# Patient Record
Sex: Female | Born: 1945 | Race: White | Hispanic: No | Smoking: Former smoker
Health system: Southern US, Community
[De-identification: ages and names within clinical notes are randomized; demographics above are authoritative.]

## PROBLEM LIST (undated history)

## (undated) DIAGNOSIS — G4700 Insomnia, unspecified: Secondary | ICD-10-CM

## (undated) DIAGNOSIS — I10 Essential (primary) hypertension: Secondary | ICD-10-CM

## (undated) DIAGNOSIS — J189 Pneumonia, unspecified organism: Secondary | ICD-10-CM

## (undated) DIAGNOSIS — J449 Chronic obstructive pulmonary disease, unspecified: Secondary | ICD-10-CM

## (undated) DIAGNOSIS — E785 Hyperlipidemia, unspecified: Secondary | ICD-10-CM

## (undated) DIAGNOSIS — I2510 Atherosclerotic heart disease of native coronary artery without angina pectoris: Secondary | ICD-10-CM

## (undated) DIAGNOSIS — R590 Localized enlarged lymph nodes: Secondary | ICD-10-CM

## (undated) DIAGNOSIS — K219 Gastro-esophageal reflux disease without esophagitis: Secondary | ICD-10-CM

## (undated) DIAGNOSIS — M8580 Other specified disorders of bone density and structure, unspecified site: Secondary | ICD-10-CM

## (undated) DIAGNOSIS — F419 Anxiety disorder, unspecified: Secondary | ICD-10-CM

## (undated) DIAGNOSIS — IMO0001 Reserved for inherently not codable concepts without codable children: Secondary | ICD-10-CM

## (undated) DIAGNOSIS — R918 Other nonspecific abnormal finding of lung field: Secondary | ICD-10-CM

## (undated) DIAGNOSIS — T451X5A Adverse effect of antineoplastic and immunosuppressive drugs, initial encounter: Principal | ICD-10-CM

## (undated) DIAGNOSIS — Z5111 Encounter for antineoplastic chemotherapy: Secondary | ICD-10-CM

## (undated) DIAGNOSIS — D701 Agranulocytosis secondary to cancer chemotherapy: Secondary | ICD-10-CM

## (undated) DIAGNOSIS — C801 Malignant (primary) neoplasm, unspecified: Secondary | ICD-10-CM

## (undated) DIAGNOSIS — IMO0002 Reserved for concepts with insufficient information to code with codable children: Secondary | ICD-10-CM

## (undated) HISTORY — DX: Other specified disorders of bone density and structure, unspecified site: M85.80

## (undated) HISTORY — DX: Reserved for inherently not codable concepts without codable children: IMO0001

## (undated) HISTORY — DX: Encounter for antineoplastic chemotherapy: Z51.11

## (undated) HISTORY — DX: Gastro-esophageal reflux disease without esophagitis: K21.9

## (undated) HISTORY — PX: APPENDECTOMY: SHX54

## (undated) HISTORY — DX: Localized enlarged lymph nodes: R59.0

## (undated) HISTORY — PX: OTHER SURGICAL HISTORY: SHX169

## (undated) HISTORY — DX: Hyperlipidemia, unspecified: E78.5

## (undated) HISTORY — PX: TONSILLECTOMY: SUR1361

## (undated) HISTORY — DX: Reserved for concepts with insufficient information to code with codable children: IMO0002

## (undated) HISTORY — DX: Agranulocytosis secondary to cancer chemotherapy: D70.1

## (undated) HISTORY — DX: Atherosclerotic heart disease of native coronary artery without angina pectoris: I25.10

## (undated) HISTORY — DX: Insomnia, unspecified: G47.00

## (undated) HISTORY — DX: Adverse effect of antineoplastic and immunosuppressive drugs, initial encounter: T45.1X5A

## (undated) HISTORY — DX: Other nonspecific abnormal finding of lung field: R91.8

---

## 2001-03-29 ENCOUNTER — Ambulatory Visit (HOSPITAL_COMMUNITY): Admission: RE | Admit: 2001-03-29 | Discharge: 2001-03-29 | Payer: Self-pay | Admitting: Gastroenterology

## 2003-10-01 ENCOUNTER — Other Ambulatory Visit: Admission: RE | Admit: 2003-10-01 | Discharge: 2003-10-01 | Payer: Self-pay | Admitting: Family Medicine

## 2003-10-01 ENCOUNTER — Encounter: Admission: RE | Admit: 2003-10-01 | Discharge: 2003-10-01 | Payer: Self-pay | Admitting: Family Medicine

## 2004-10-05 ENCOUNTER — Other Ambulatory Visit: Admission: RE | Admit: 2004-10-05 | Discharge: 2004-10-05 | Payer: Self-pay | Admitting: Family Medicine

## 2005-10-06 ENCOUNTER — Other Ambulatory Visit: Admission: RE | Admit: 2005-10-06 | Discharge: 2005-10-06 | Payer: Self-pay | Admitting: Family Medicine

## 2006-03-17 ENCOUNTER — Encounter: Admission: RE | Admit: 2006-03-17 | Discharge: 2006-03-17 | Payer: Self-pay | Admitting: Family Medicine

## 2008-01-10 ENCOUNTER — Other Ambulatory Visit: Admission: RE | Admit: 2008-01-10 | Discharge: 2008-01-10 | Payer: Self-pay | Admitting: Family Medicine

## 2010-06-25 NOTE — Procedures (Signed)
[place]  Patient:    Tammie Clay, TABARES Visit Number: [id number] MRN: [id number]          Service Type: END Location: ENDO Attending Physician:  Dennison Bulla Ii Dictated by:   Verlin Grills, M.D. Proc. Date: 03/29/01 Admit Date:  03/29/2001 Discharge Date: 03/29/2001   CC:         [name], M.D.   Procedure Report  DATE OF BIRTH:  REFERRING PHYSICIAN:  [name], M.D.  PROCEDURE PERFORMED:  Colonoscopy.  ENDOSCOPIST:  Verlin Grills, M.D.  INDICATIONS FOR PROCEDURE:  The patient is a 65 year old female who is due for her first screening colonoscopy with polypectomy to prevent colon cancer.  I discussed with the patient the complications associated with colonoscopy and polypectomy including a 15 per 1000 risk of bleeding and 4 per 1000 risk of colon perforation requiring surgical repair.  The patient has signed the operative permit.  PREMEDICATION:  Versed 6 mg, fentanyl 50 mcg.  ENDOSCOPE:  Olympus pediatric video colonoscope.  DESCRIPTION OF PROCEDURE:  After obtaining informed consent, the patient was placed in the left lateral decubitus position.  I administered intravenous fentanyl and intravenous Versed to achieve conscious sedation for the procedure.  The patients blood pressure, oxygen saturation and cardiac rhythm were monitored throughout the procedure and documented in the medical record.  Anal inspection was normal.  Digital rectal exam was normal.  The Olympus pediatric video colonoscope was then introduced into the rectum and easily advanced to the cecum.  Colonic preparation for the exam today was excellent.  Rectum:  Normal.  Sigmoid colon and descending colon:  Normal.  Splenic flexure:  Normal.  Transverse colon:  Normal.  Hepatic flexure:  Normal.  Ascending colon:  Normal.  Cecum and ileocecal valve:  Normal.  ASSESSMENT:  Normal screening proctocolonoscopy to  the cecum.  No endoscopic evidence for the presence of colorectal neoplasia.  RECOMMENDATIONS:  Repeat colonoscopy in approximately 10 years. Dictated by:   Verlin Grills, M.D. Attending Physician:  Dennison Bulla Ii DD:  03/29/01 TD:  03/29/01 Job: [id number]

## 2012-03-29 ENCOUNTER — Other Ambulatory Visit: Payer: Self-pay | Admitting: Family Medicine

## 2012-03-29 ENCOUNTER — Ambulatory Visit
Admission: RE | Admit: 2012-03-29 | Discharge: 2012-03-29 | Disposition: A | Discharge status: Home / Self Care | Payer: No Typology Code available for payment source | Source: Ambulatory Visit | Attending: Family Medicine | Admitting: Family Medicine

## 2012-03-29 DIAGNOSIS — Z006 Encounter for examination for normal comparison and control in clinical research program: Secondary | ICD-10-CM

## 2012-06-27 ENCOUNTER — Other Ambulatory Visit: Payer: Self-pay | Admitting: Gastroenterology

## 2012-07-03 ENCOUNTER — Encounter (HOSPITAL_COMMUNITY): Payer: Self-pay | Admitting: *Deleted

## 2012-07-03 ENCOUNTER — Encounter (HOSPITAL_COMMUNITY): Payer: Self-pay | Admitting: Pharmacy Technician

## 2012-07-04 NOTE — Progress Notes (Signed)
ekg 05-24-2012 pharmquest on chart

## 2012-07-17 ENCOUNTER — Ambulatory Visit (HOSPITAL_COMMUNITY)
Admission: RE | Admit: 2012-07-17 | Discharge: 2012-07-17 | Disposition: A | Discharge status: Home / Self Care | Payer: Medicare Other | Source: Ambulatory Visit | Attending: Gastroenterology | Admitting: Gastroenterology

## 2012-07-17 ENCOUNTER — Ambulatory Visit (HOSPITAL_COMMUNITY): Payer: Medicare Other | Admitting: Anesthesiology

## 2012-07-17 ENCOUNTER — Encounter (HOSPITAL_COMMUNITY): Payer: Self-pay | Admitting: *Deleted

## 2012-07-17 ENCOUNTER — Encounter (HOSPITAL_COMMUNITY): Payer: Self-pay | Admitting: Anesthesiology

## 2012-07-17 ENCOUNTER — Encounter (HOSPITAL_COMMUNITY)
Admission: RE | Disposition: A | Discharge status: Home / Self Care | Payer: Self-pay | Source: Ambulatory Visit | Attending: Gastroenterology

## 2012-07-17 DIAGNOSIS — R143 Flatulence: Secondary | ICD-10-CM | POA: Insufficient documentation

## 2012-07-17 DIAGNOSIS — I10 Essential (primary) hypertension: Secondary | ICD-10-CM | POA: Insufficient documentation

## 2012-07-17 DIAGNOSIS — J4489 Other specified chronic obstructive pulmonary disease: Secondary | ICD-10-CM | POA: Insufficient documentation

## 2012-07-17 DIAGNOSIS — K921 Melena: Secondary | ICD-10-CM | POA: Insufficient documentation

## 2012-07-17 DIAGNOSIS — R142 Eructation: Secondary | ICD-10-CM | POA: Insufficient documentation

## 2012-07-17 DIAGNOSIS — M899 Disorder of bone, unspecified: Secondary | ICD-10-CM | POA: Insufficient documentation

## 2012-07-17 DIAGNOSIS — Z79899 Other long term (current) drug therapy: Secondary | ICD-10-CM | POA: Insufficient documentation

## 2012-07-17 DIAGNOSIS — Z8711 Personal history of peptic ulcer disease: Secondary | ICD-10-CM | POA: Insufficient documentation

## 2012-07-17 DIAGNOSIS — K5730 Diverticulosis of large intestine without perforation or abscess without bleeding: Secondary | ICD-10-CM | POA: Insufficient documentation

## 2012-07-17 DIAGNOSIS — Z9089 Acquired absence of other organs: Secondary | ICD-10-CM | POA: Insufficient documentation

## 2012-07-17 DIAGNOSIS — M949 Disorder of cartilage, unspecified: Secondary | ICD-10-CM | POA: Insufficient documentation

## 2012-07-17 DIAGNOSIS — R141 Gas pain: Secondary | ICD-10-CM | POA: Insufficient documentation

## 2012-07-17 DIAGNOSIS — J449 Chronic obstructive pulmonary disease, unspecified: Secondary | ICD-10-CM | POA: Insufficient documentation

## 2012-07-17 DIAGNOSIS — E7800 Pure hypercholesterolemia, unspecified: Secondary | ICD-10-CM | POA: Insufficient documentation

## 2012-07-17 HISTORY — DX: Essential (primary) hypertension: I10

## 2012-07-17 HISTORY — DX: Chronic obstructive pulmonary disease, unspecified: J44.9

## 2012-07-17 HISTORY — PX: COLONOSCOPY WITH PROPOFOL: SHX5780

## 2012-07-17 HISTORY — PX: ESOPHAGOGASTRODUODENOSCOPY (EGD) WITH PROPOFOL: SHX5813

## 2012-07-17 SURGERY — COLONOSCOPY WITH PROPOFOL
Anesthesia: General

## 2012-07-17 MED ORDER — SODIUM CHLORIDE 0.9 % IV SOLN
INTRAVENOUS | Status: DC
  Administered 2012-07-17: 11:00:00 via INTRAVENOUS

## 2012-07-17 MED ORDER — PROMETHAZINE HCL 25 MG/ML IJ SOLN: 6.2500 mg | INTRAMUSCULAR | Status: DC | PRN

## 2012-07-17 MED ORDER — BUTAMBEN-TETRACAINE-BENZOCAINE 2-2-14 % EX AERO
INHALATION_SPRAY | CUTANEOUS | Status: DC | PRN
  Administered 2012-07-17: 1 via TOPICAL

## 2012-07-17 MED ORDER — MEPERIDINE HCL 100 MG/ML IJ SOLN: 6.2500 mg | INTRAMUSCULAR | Status: DC | PRN

## 2012-07-17 MED ORDER — LACTATED RINGERS IV SOLN: INTRAVENOUS | Status: DC

## 2012-07-17 MED ORDER — FENTANYL CITRATE 0.05 MG/ML IJ SOLN
INTRAMUSCULAR | Status: DC | PRN
  Administered 2012-07-17: 50 ug via INTRAVENOUS

## 2012-07-17 MED ORDER — PROPOFOL 10 MG/ML IV BOLUS
INTRAVENOUS | Status: DC | PRN
  Administered 2012-07-17: 50 mg via INTRAVENOUS
  Administered 2012-07-17 (×3): 25 mg via INTRAVENOUS

## 2012-07-17 MED ORDER — MIDAZOLAM HCL 5 MG/5ML IJ SOLN
INTRAMUSCULAR | Status: DC | PRN
  Administered 2012-07-17: 2 mg via INTRAVENOUS

## 2012-07-17 SURGICAL SUPPLY — 25 items

## 2012-07-17 NOTE — Anesthesia Postprocedure Evaluation (Signed)
  Anesthesia Post-op Note  Patient: Tammie Clay  Procedure(s) Performed: Procedure(s) (LRB): COLONOSCOPY WITH PROPOFOL (N/A) ESOPHAGOGASTRODUODENOSCOPY (EGD) WITH PROPOFOL (N/A)  Patient Location: PACU  Anesthesia Type: MAC  Level of Consciousness: awake and alert   Airway and Oxygen Therapy: Patient Spontanous Breathing  Post-op Pain: mild  Post-op Assessment: Post-op Vital signs reviewed, Patient's Cardiovascular Status Stable, Respiratory Function Stable, Patent Airway and No signs of Nausea or vomiting  Last Vitals:  Filed Vitals:   07/17/12 1200  BP: 144/82  Pulse:   Temp:   Resp: 20    Post-op Vital Signs: stable   Complications: No apparent anesthesia complications

## 2012-07-17 NOTE — Anesthesia Preprocedure Evaluation (Addendum)
Anesthesia Evaluation  Patient identified by MRN, date of birth, ID band Patient awake    Reviewed: Allergy & Precautions, H&P , NPO status , Patient's Chart, lab work & pertinent test results  Airway Mallampati: II TM Distance: >3 FB Neck ROM: Full    Dental no notable dental hx.    Pulmonary neg pulmonary ROS, COPD COPD inhaler, Current Smoker,  breath sounds clear to auscultation  Pulmonary exam normal       Cardiovascular hypertension, Pt. on medications Rhythm:Regular Rate:Normal     Neuro/Psych negative neurological ROS  negative psych ROS   GI/Hepatic negative GI ROS, Neg liver ROS,   Endo/Other  negative endocrine ROS  Renal/GU negative Renal ROS  negative genitourinary   Musculoskeletal negative musculoskeletal ROS (+)   Abdominal   Peds negative pediatric ROS (+)  Hematology negative hematology ROS (+)   Anesthesia Other Findings   Reproductive/Obstetrics negative OB ROS                          Anesthesia Physical Anesthesia Plan  ASA: III  Anesthesia Plan: General   Post-op Pain Management:    Induction:   Airway Management Planned:   Additional Equipment:   Intra-op Plan:   Post-operative Plan:   Informed Consent: I have reviewed the patients History and Physical, chart, labs and discussed the procedure including the risks, benefits and alternatives for the proposed anesthesia with the patient or authorized representative who has indicated his/her understanding and acceptance.   Dental advisory given  Plan Discussed with: CRNA  Anesthesia Plan Comments:         Anesthesia Quick Evaluation

## 2012-07-17 NOTE — H&P (Signed)
  Problem: Hematochezia and abdominal bloating on aspirin. Normal screening colonoscopies in 2003 and in 2010.  History: The patient is a 67 year old female born Sep 16, 1945. The patient underwent a normal screening colonoscopies in 2003 and 2010.  The patient chronically takes aspirin on a daily basis and is primary prevention.  Approximately 3 weeks ago, the patient had a few episodes of small volume hematochezia. She has been experiencing abdominal bloating. There is no past history of peptic ulcer disease.  The patient's CBC was normal.  The patient is scheduled to undergo a diagnostic esophagogastroduodenoscopy and colonoscopy to evaluate resolved hematochezia and abdominal bloating on aspirin. She stop taking aspirin on Jun 27, 2012.  Chronic medications: Multivitamin. Calcium. Vitamin D. Atorvastatin. WelChol. Fish oil. Lisinopril. Hydrochlorothiazide. Metoprolol. Trazodone. Spiriva.  Past medical and surgical history: Tonsillectomy. Appendectomy. Cesarean sections. Urethral cyst surgery. 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 Osteopenia. Hypercholesterolemia. Hypertension.  Medication allergies: None  Exam: The patient is alert and lying comfortably on the endoscopy stretcher. Abdomen is soft, flat, and nontender to palpation. Cardiac exam reveals a regular rhythm. Lungs are clear to auscultation.  Plan: Proceed with diagnostic esophagogastroduodenoscopy and colonoscopy to evaluate abdominal bloating and hematochezia while taking aspirin.

## 2012-07-17 NOTE — Op Note (Signed)
Problem: Hematochezia and abdominal bloating while taking aspirin  Procedure: Diagnostic esophagogastroduodenoscopy  Endoscopist: Danise Edge  Premedication: Propofol administered by anesthesia  Procedure: The patient was placed in the left lateral decubitus position. The Pentax gastroscope was passed through the posterior hypopharynx into the proximal esophagus without difficulty. The hypopharynx, larynx, and vocal cords appeared normal.  Esophagoscopy: The proximal, mid, and lower segments of the esophageal mucosa appear normal. The squamocolumnar junction is noted at 39 cm from the incisor teeth. There is no endoscopic evidence for the presence of erosive esophagitis or Barrett's esophagus.  Gastroscopy: Retroflex view of the gastric cardia and fundus was normal. The gastric body, antrum, and pylorus appeared normal.  Duodenoscopy: The duodenal bulb and descending duodenum appeared.  Assessment: Normal esophagogastroduodenoscopy.  Procedure: Diagnostic colonoscopy to evaluate resolved hematochezia.  Endoscopist: Danise Edge  Premedication: Propofol administered by anesthesia  Procedure: Anal inspection and digital rectal exam were normal. The Pentax pediatric colonoscope was introduced into the rectum and advanced to the cecum. A normal-appearing ileocecal valve and appendiceal orifice were identified. Colonic preparation for the exam today was good.  Rectum. Normal. Retroflex view of the distal rectum normal.  Sigmoid colon and descending colon. Left colonic diverticulosis.  Splenic flexure. Normal.  Transverse colon. Normal.  Hepatic flexure. Normal.  Ascending colon. Normal.  Cecum and ileocecal valve. Normal.  Assessment: Normal proctocolonoscopy to the cecum except for the presence of left colonic diverticulosis  Recommendations: Remain off aspirin. Schedule repeat screening colonoscopy in 10 years.

## 2012-07-17 NOTE — Transfer of Care (Signed)
Immediate Anesthesia Transfer of Care Note  Patient: Tammie Clay  Procedure(s) Performed: Procedure(s): COLONOSCOPY WITH PROPOFOL (N/A) ESOPHAGOGASTRODUODENOSCOPY (EGD) WITH PROPOFOL (N/A)  Patient Location: PACU  Anesthesia Type:MAC  Level of Consciousness: awake, sedated and patient cooperative  Airway & Oxygen Therapy: Patient Spontanous Breathing and Patient connected to face mask oxygen  Post-op Assessment: Report given to PACU RN and Post -op Vital signs reviewed and stable  Post vital signs: Reviewed and stable  Complications: No apparent anesthesia complications

## 2012-07-18 ENCOUNTER — Encounter (HOSPITAL_COMMUNITY): Payer: Self-pay | Admitting: Gastroenterology

## 2013-11-15 ENCOUNTER — Other Ambulatory Visit: Payer: Self-pay | Admitting: Family Medicine

## 2013-11-15 ENCOUNTER — Ambulatory Visit
Admission: RE | Admit: 2013-11-15 | Discharge: 2013-11-15 | Disposition: A | Discharge status: Home / Self Care | Payer: Medicare Other | Source: Ambulatory Visit | Attending: Family Medicine | Admitting: Family Medicine

## 2013-11-15 DIAGNOSIS — R1012 Left upper quadrant pain: Secondary | ICD-10-CM

## 2013-11-18 ENCOUNTER — Other Ambulatory Visit: Payer: Self-pay | Admitting: Family Medicine

## 2013-11-18 DIAGNOSIS — R9389 Abnormal findings on diagnostic imaging of other specified body structures: Secondary | ICD-10-CM

## 2013-11-19 ENCOUNTER — Ambulatory Visit
Admission: RE | Admit: 2013-11-19 | Discharge: 2013-11-19 | Disposition: A | Discharge status: Home / Self Care | Payer: Medicare Other | Source: Ambulatory Visit | Attending: Family Medicine | Admitting: Family Medicine

## 2013-11-19 DIAGNOSIS — R9389 Abnormal findings on diagnostic imaging of other specified body structures: Secondary | ICD-10-CM

## 2013-11-19 DIAGNOSIS — R918 Other nonspecific abnormal finding of lung field: Secondary | ICD-10-CM | POA: Insufficient documentation

## 2013-11-19 DIAGNOSIS — R590 Localized enlarged lymph nodes: Secondary | ICD-10-CM

## 2013-11-19 HISTORY — DX: Other nonspecific abnormal finding of lung field: R91.8

## 2013-11-19 HISTORY — DX: Localized enlarged lymph nodes: R59.0

## 2013-11-19 MED ORDER — IOHEXOL 300 MG/ML  SOLN
75.0000 mL | Freq: Once | INTRAMUSCULAR | Status: AC | PRN
  Administered 2013-11-19: 75 mL via INTRAVENOUS

## 2013-11-20 ENCOUNTER — Encounter: Payer: Self-pay | Admitting: *Deleted

## 2013-11-20 DIAGNOSIS — K219 Gastro-esophageal reflux disease without esophagitis: Secondary | ICD-10-CM | POA: Insufficient documentation

## 2013-11-20 DIAGNOSIS — M8580 Other specified disorders of bone density and structure, unspecified site: Secondary | ICD-10-CM | POA: Insufficient documentation

## 2013-11-20 DIAGNOSIS — R918 Other nonspecific abnormal finding of lung field: Secondary | ICD-10-CM | POA: Insufficient documentation

## 2013-11-20 DIAGNOSIS — E785 Hyperlipidemia, unspecified: Secondary | ICD-10-CM | POA: Insufficient documentation

## 2013-11-21 ENCOUNTER — Other Ambulatory Visit: Payer: Self-pay | Admitting: *Deleted

## 2013-11-21 ENCOUNTER — Institutional Professional Consult (permissible substitution) (INDEPENDENT_AMBULATORY_CARE_PROVIDER_SITE_OTHER): Payer: Medicare Other | Admitting: Cardiothoracic Surgery

## 2013-11-21 ENCOUNTER — Encounter: Payer: Self-pay | Admitting: Cardiothoracic Surgery

## 2013-11-21 ENCOUNTER — Other Ambulatory Visit: Payer: Self-pay

## 2013-11-21 VITALS — BP 121/80 | HR 80 | Ht 62.0 in | Wt 173.0 lb

## 2013-11-21 DIAGNOSIS — J449 Chronic obstructive pulmonary disease, unspecified: Secondary | ICD-10-CM

## 2013-11-21 DIAGNOSIS — R911 Solitary pulmonary nodule: Secondary | ICD-10-CM

## 2013-11-21 DIAGNOSIS — R918 Other nonspecific abnormal finding of lung field: Secondary | ICD-10-CM

## 2013-11-21 DIAGNOSIS — I2510 Atherosclerotic heart disease of native coronary artery without angina pectoris: Secondary | ICD-10-CM

## 2013-11-21 DIAGNOSIS — J441 Chronic obstructive pulmonary disease with (acute) exacerbation: Secondary | ICD-10-CM | POA: Insufficient documentation

## 2013-11-21 HISTORY — DX: Chronic obstructive pulmonary disease, unspecified: J44.9

## 2013-11-21 HISTORY — DX: Atherosclerotic heart disease of native coronary artery without angina pectoris: I25.10

## 2013-11-21 NOTE — Progress Notes (Signed)
[address]       [place] [address]             [phone]                    Karem A Melick McKinney Medical Record #[id number] Date of Birth: [birth year]  Referring: Mayra Neer, MD Primary Care: Mayra Neer, MD  Chief Complaint:    Chief Complaint  Patient presents with  . Lung Lesion    Surgical eval on Pulmonary nodule, Chest CT 11/19/13     History of Present Illness:    Tammie Clay 68 y.o. female is seen in the office  today for new diagnosis of left upper lobe lung lesion with question of mediastinal involvement. The patient has been a long term smoker, quit one year ago but prior smoked for 55 years. She has no occupational exposure to asbestos.   She had increasing belching and discomfort in her left chest when she lives on her left side, she mentioned this at her yearly checkup. A chest x-ray was obtained suggesting a left hilar mass.   Patient presents to the thoracic surgical office for evaluation and treatment.   Current Activity/ Functional Status:  Patient is independent with mobility/ambulation, transfers, ADL's, IADL's.   Zubrod Score: At the time of surgery this patient's most appropriate activity status/level should be described as: []     0    Normal activity, no symptoms [x]     1    Restricted in physical strenuous activity but ambulatory, able to do out light work []     2    Ambulatory and capable of self care, unable to do work activities, up and about               >50 % of waking hours                              []     3    Only limited self care, in bed greater than 50% of waking hours []     4    Completely disabled, no self care, confined to bed or chair []     5    Moribund   Past Medical History  Diagnosis Date  . Hypertension   . COPD (chronic obstructive pulmonary disease)   . Hyperlipidemia   . GERD (gastroesophageal reflux disease)   . Osteopenia   . Insomnia   . Mediastinal lymphadenopathy  11/19/13    PER CT  . Multiple lung nodules on CT 11/19/13    LEFT LUNG BASE  . Coronary artery calcification seen on CAT scan 11/21/2013  . COPD (chronic obstructive pulmonary disease) 11/21/2013     Past Surgical History  Procedure Laterality Date  .  c section x 2    . Urethral cyst removed    . Tonsillectomy    . Appendectomy    . Colonoscopy with propofol N/A 07/17/2012    Procedure: COLONOSCOPY WITH PROPOFOL;  Surgeon: Garlan Fair, MD;  Location: [facility];  Service: Endoscopy;  Laterality: N/A;  . Esophagogastroduodenoscopy (egd) with propofol N/A 07/17/2012    Procedure: ESOPHAGOGASTRODUODENOSCOPY (EGD) WITH PROPOFOL;  Surgeon: Garlan Fair, MD;  Location: [facility];  Service: Endoscopy;  Laterality: N/A;    Family History  Problem Relation Age of Onset  . Diabetes Father   . Heart disease Father     MI  .  CVA Mother   . Cancer Cousin     LIPOSARCOMA  . Heart disease Cousin     HEART TRANSPLANTS X 2    History   Social History  . Marital Status: Divorced    Spouse Name: N/A    Number of Children: 3  . Years of Education: N/A   Occupational History  .  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 Smoking status: Former Smoker -- 0.50 packs/day for 50 years    Types: Cigarettes    Quit date: 10/08/2012  . Smokeless tobacco: Never Used     Comment: smokes Vapor daily  . Alcohol Use: No  . Drug Use: No  . Sexual Activity: Not on file   Other Topics Concern  . Not on file   Social History Narrative  . No narrative on file    History  Smoking status  . Former Smoker -- 0.50 packs/day for 50 years  . Types: Cigarettes  . Quit date: 10/08/2012  Smokeless tobacco  . Never Used    Comment: smokes Vapor daily    History  Alcohol Use No     No Known Allergies  Current Outpatient Prescriptions  Medication  Sig Dispense Refill  . albuterol (PROVENTIL HFA;VENTOLIN HFA) 108 (90 BASE) MCG/ACT inhaler Inhale 2 puffs into the lungs every 6 (six) hours as needed for wheezing.      Marland Kitchen aspirin 81 MG tablet Take 81 mg by mouth daily.      Marland Kitchen atorvastatin (LIPITOR) 40 MG tablet Take 40 mg by mouth daily.      . Calcium Carbonate-Vitamin D (CALCIUM-D) 600-400 MG-UNIT TABS Take 1 tablet by mouth 2 (two) times daily.      Marland Kitchen lisinopril-hydrochlorothiazide (PRINZIDE,ZESTORETIC) 20-12.5 MG per tablet Take 2 tablets by mouth every morning.       . metoprolol succinate (TOPROL-XL) 50 MG 24 hr tablet Take 50 mg by mouth every morning. Take with or immediately following a meal.      . Multiple Vitamin (MULTIVITAMIN WITH MINERALS) TABS Take 1 tablet by mouth daily.      . Omega-3 Fatty Acids (OMEGA-3 FISH OIL PO) Take 1 capsule by mouth daily.      Marland Kitchen tiotropium (SPIRIVA) 18 MCG inhalation capsule Place 18 mcg into inhaler and inhale daily.      . traZODone (DESYREL) 50 MG tablet Take 50 mg by mouth at bedtime. 1/2 TO 1 TAB       No current facility-administered medications for this visit.     Review of Systems:     Cardiac Review of Systems: Y or N  Chest Pain Blue.Reese    ]  Resting SOB [ n  ] Exertional SOB  [  y]  Orthopnea Florencio.Farrier  ]   Pedal Edema [  n ]    Palpitations [ n ] Syncope  [ n ]   Presyncope [ n  ]  General Review of Systems: [Y] = yes [  ]=no Constitional: recent weight change [n ];  Wt loss over the last 3 months [   ] anorexia [  ]; fatigue Blue.Reese  ]; nausea [  n]; night sweats [  ]; fever [ n ]; or chills [ n ];          Dental: poor dentition[  ]; Last Dentist visit:   Eye :  blurred vision [  ]; diplopia [   ]; vision changes [  ];  Amaurosis fugax[  ]; Resp: cough Blue.Reese  ];  wheezing[ n ];  hemoptysis[n  ]; shortness of breath[ y ]; paroxysmal nocturnal dyspnea[ n ]; dyspnea on exertion[  y]; or orthopnea[  ];  GI:  gallstones[ n ], vomiting[ n ];  dysphagia[n  ]; melena[ n ];  hematochezia [  ]; heartburn[  ];    Hx of  Colonoscopy[ y ]; GU: kidney stones [  ]; hematuria[  ];   dysuria [  ];  nocturia[  ];  history of     obstruction [  ]; urinary frequency [  ]             Skin: rash, swelling[  ];, hair loss[  ];  peripheral edema[  ];  or itching[  ]; Musculosketetal: myalgias[  ];  joint swelling[  ];  joint erythema[n  ];  joint pain[  ];  back pain[  ];  Heme/Lymph: bruising[  ];  bleeding[  ];  anemia[  ];  Neuro: TIA[  ];  headaches[  ];  stroke[  ];  vertigo[  ];  seizures[  ];   paresthesias[  ];  difficulty walking[ n ];  Psych:depression[  ]; anxiety[  ];  Endocrine: diabetes[  ];  thyroid dysfunction[  ];  Immunizations: Flu up to date Blue.Reese  ]; Pneumococcal up to date Blue.Reese  ];  Other:  Physical Exam: BP 121/80  Pulse 80  Ht 5\' 2"  (1.575 m)  Wt 173 lb (78.472 kg)  BMI 31.63 kg/m2  SpO2 92%  PHYSICAL EXAMINATION:  General appearance: alert and cooperative Neurologic: intact Heart: regular rate and rhythm, S1, S2 normal, no murmur, click, rub or gallop Lungs: diminished breath sounds LUL Abdomen: soft, non-tender; bowel sounds normal; no masses,  no organomegaly Extremities: extremities normal, atraumatic, no cyanosis or edema and Homans sign is negative, no sign of DVT Patient has no cervical or supraclavicular or axillary adenopathy She has no abdominal aneurysm that is palpable PT and DP pulses are palpable bilaterally  Diagnostic Studies & Laboratory data:     Recent Radiology Findings:   Dg Chest 2 View  11/15/2013   CLINICAL DATA:  Left upper quadrant pain with chest discomfort. COPD.  EXAM: CHEST  2 VIEW  COMPARISON:  03/29/2012 and 03/17/2006  FINDINGS: Lungs are hypoinflated and demonstrate increased density with somewhat sharp a lobular lateral border over the left hilar region. Couple small stable right perihilar nodules likely calcified granulomas unchanged from 2008. No evidence of pleural effusion. Cardiac silhouette is within normal. There is mild calcified plaque over  the thoracic aorta. There are mild degenerative changes of the spine.  IMPRESSION: Moderate prominence of the left hilum with lobulated lateral border suspicious for left hilar mass/adenopathy. Recommend contrast-enhanced chest CT for further evaluation.   Electronically Signed   By: Marin Olp M.D.   On: 11/15/2013 15:20   Ct Chest W Contrast  11/19/2013   CLINICAL DATA:  Left hilar mass on chest radiograph. Left upper quadrant pain.  EXAM: CT CHEST WITH CONTRAST  TECHNIQUE: Multidetector CT imaging of the chest was performed during intravenous contrast administration.  CONTRAST:  64mL OMNIPAQUE IOHEXOL 300 MG/ML  SOLN  COMPARISON:  Chest radiograph on 11/15/2013  FINDINGS: Mediastinum/Hilar Regions: Mediastinal lymphadenopathy is seen in the lateral aortic region measuring 1.6 x 4.3 cm on image 20. Sub- carina lymphadenopathy seen measuring 2.1 cm on image  26.  Other Thoracic Lymphadenopathy:  None.  Lungs: Left upper lobe collapse is seen with a central low-attenuation mass measuring 3.9 x 4.2 cm. This is contiguous with the left hilum obstructs the central left upper lobe bronchus.  In addition, there is an 8 mm indeterminate nodular density in the anterior left lung base on image 38. There are also several subpleural pulmonary nodular densities in the left lower lobe, largest measuring 7 mm on image 44.  Pleura:  Small left pleural effusion noted.  Vascular/Cardiac:  No acute findings identified.  Musculoskeletal:  No suspicious bone lesions identified.  Other:  None.  IMPRESSION: 4 cm central left upper lobe mass which involves the hilum and causes left upper lobe collapse. This is consistent with primary bronchogenic carcinoma. Consider bronchoscopy for tissue diagnosis.  Mediastinal lymphadenopathy, consistent with metastatic disease.  Small left pleural effusion.  Indeterminate pulmonary nodules in the left lung base, largest measuring 8 mm. PET-CT scan should be considered for further evaluation.    Electronically Signed   By: Earle Gell M.D.   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 We have made arrangements for pulmonary function studies, MRI of the brain to rule out a test this is, PET scan for staging which will be done October 22 or 23rd. On October 27 after the PET scan has been completed we will proceed with bronchoscopy with ebus to biopsy the primary mass and mediastinal nodes. Assuming the pathology will be available she will have an appointment in the multidisciplinary thoracic oncology clinic on October 29 to see medical and radiation oncology depending on the final pathology and radiographic reports.     I spent 60 minutes counseling the patient face to face. The total time spent in the appointment was 80 minutes.  Grace Isaac MD      [address] [place] [address] Office [phone]   Beeper [phone]  11/21/2013 2:38 PM

## 2013-11-21 NOTE — Patient Instructions (Signed)
Pulmonary Nodule A pulmonary nodule is a small, round growth of tissue in the lung. Pulmonary nodules can range in size from less than 1/5 inch (4 mm) to a little bigger than an inch (25 mm). Most pulmonary nodules are detected when imaging tests of the lung are being performed for a different problem. Pulmonary nodules are usually not cancerous (benign). However, some pulmonary nodules are cancerous (malignant). Follow-up treatment or testing is based on the size of the pulmonary nodule and your risk of getting lung cancer.  CAUSES Benign pulmonary nodules can be caused by various things. Some of the causes include:   Bacterial, fungal, or viral infections. This is usually an old infection that is no longer active, but it can sometimes be a current, active infection.  A benign mass of tissue.  Inflammation from conditions such as rheumatoid arthritis.   Abnormal blood vessels in the lungs. Malignant pulmonary nodules can result from lung cancer or from cancers that spread to the lung from other places in the body. SIGNS AND SYMPTOMS Pulmonary nodules usually do not cause symptoms. DIAGNOSIS Most often, pulmonary nodules are found incidentally when an X-ray or CT scan is performed to look for some other problem in the lung area. To help determine whether a pulmonary nodule is benign or malignant, your health care provider will take a medical history and order a variety of tests. Tests done may include:   Blood tests.  A skin test called a tuberculin test. This test is used to determine if you have been exposed to the germ that causes tuberculosis.   Chest X-rays. If possible, a new X-ray may be compared with X-rays you have had in the past.   CT scan. This test shows smaller pulmonary nodules more clearly than an X-ray.   Positron emission tomography (PET) scan. In this test, a safe amount of a radioactive substance is injected into the bloodstream. Then, the scan takes a picture of  the pulmonary nodule. The radioactive substance is eliminated from your body in your urine.   Biopsy. A tiny piece of the pulmonary nodule is removed so it can be checked under a microscope. TREATMENT  Pulmonary nodules that are benign normally do not require any treatment because they usually do not cause symptoms or breathing problems. Your health care provider may want to monitor the pulmonary nodule through follow-up CT scans. The frequency of these CT scans will vary based on the size of the nodule and the risk factors for lung cancer. For example, CT scans will need to be done more frequently if the pulmonary nodule is larger and if you have a history of smoking and a family history of cancer. Further testing or biopsies may be done if any follow-up CT scan shows that the size of the pulmonary nodule has increased. HOME CARE INSTRUCTIONS  Only take over-the-counter or prescription medicines as directed by your health care provider.  Keep all follow-up appointments with your health care provider. SEEK MEDICAL CARE IF:  You have trouble breathing when you are active.   You feel sick or unusually tired.   You do not feel like eating.   You lose weight without trying to.   You develop chills or night sweats.  SEEK IMMEDIATE MEDICAL CARE IF:  You cannot catch your breath, or you begin wheezing.   You cannot stop coughing.   You cough up blood.   You become dizzy or feel like you are going to pass out.   You   have sudden chest pain.   You have a fever or persistent symptoms for more than 2-3 days.   You have a fever and your symptoms suddenly get worse. MAKE SURE YOU:  Understand these instructions.  Will watch your condition.  Will get help right away if you are not doing well or get worse. Document Released: 11/21/2008 Document Revised: 09/26/2012 Document Reviewed: 07/16/2012 [facility] Patient Information 2015 [place], [place]. This information is not intended  to replace advice given to you by your health care provider. Make sure you discuss any questions you have with your health care provider. Lung Cancer Lung cancer is an abnormal growth of cells in one or both of your lungs. These extra cells may form a mass of tissue called a growth or tumor. Tumors can be either cancerous (malignant) or not cancerous (benign).  Lung cancer is the most common cause of cancer death in men and women. There are several different types of lung cancers. Usually, lung cancer is described as either small cell lung cancer or nonsmall cell lung cancer. Other types of cancer occur in the lungs, including carcinoid and cancers spread from other organs. The types of cancer have different behavior and treatment. RISK FACTORS Smoking is the most common risk factor for developing lung cancer. Other risk factors include:  Radon gas exposure.  Asbestos and other industrial substance exposure.  Second hand tobacco smoke.  Air pollution.  Family or personal history of lung cancer.  Age older than 55 years. CAUSES  Lung cancer usually starts when the lungs are exposed to harmful chemicals. Smoking is the most common risk factor for lung cancer. When you quit smoking, your risk of lung cancer falls each year (but is never the same as a person who has never smoked).  SYMPTOMS  Lung cancer may not have any symptoms in its early stages. The symptoms can depend on the type of cancer, its location, and other factors. Symptoms can include:  Cough (either new, different, or more severe).  Shortness of breath.  Coughing up blood (hemoptysis).  Chest pain.  Hoarseness.  Swelling of the face.  Drooping eyelid.  Changes in blood tests, such as low sodium (hyponatremia), high calcium (hypercalcemia), or low blood count (anemia).  Weight loss. DIAGNOSIS  Your health care provider may suspect lung cancer based on your symptoms or based on tests obtained for other reasons. Tests  or procedures used to find or confirm the presence of lung cancer may include:  Chest X-ray.  CT scan of the lungs and chest.  Blood tests.  Taking a tissue sample (biopsy) from your lung to look for cancer cells. Your cancer will be staged to determine its severity and extent. Staging is a careful attempt to find out the size of the tumor, whether the cancer has spread, and if so, to what parts of the body. You may need to have more tests to determine the stage of your cancer. The test results will help determine what treatment plan is best for you.   Stage 0--This is the earliest stage of lung cancer. In this stage the tumor is present in only a few layers of cells and has not grown beyond the inner lining of the lungs. Stage 0 (carcinoma in situ) is considered noninvasive, meaning at this stage it is not yet capable of spreading to other regions.  Stage I-- The cancer is located only in the lungs and not spread to any lymph nodes.  Stage II--The cancer is in  the lungs and the nearby lymph nodes.  Stage III--The cancer is in the lungs and the lymph nodes in the middle of the chest. This is also called locally advanced disease. This stage has two subtypes:  Stage IIIa - The cancer has spread only to lymph nodes on the same side of the chest where the cancer started.  Stage IIIb - The cancer has spread to lymph nodes on the opposite side of the chest or above the collar bone.  Stage IV-- This is the most advanced stage of lung cancer and is also called advanced disease. This stage describes when the cancer has spread to both lungs, the fluid in the area around the lungs, or to another body part. Your health care provider may tell you the detailed stage of your cancer, which includes both a number and a letter.  TREATMENT  Depending on the type and stage, lung cancer may be treated with surgery, radiation therapy, chemotherapy, or targeted therapy. Some people have a combination of these  therapies. Your treatment plan will be developed by your health care team.  [place] not smoke.  Only take over-the-counter or prescription medicines for pain, discomfort, or fever as directed by your health care provider.  Maintain a healthy diet.  Consider joining a support group. This may help you learn to cope with the stress of having lung cancer.  Seek advice to help you manage treatment side effects.  Keep all follow-up appointments as directed by your health care provider.  Inform your cancer specialist if you are admitted to the hospital. [place] IF:   You are losing weight without trying.  You have a persistent cough.  You feel short of breath.  You tire easily. SEEK IMMEDIATE MEDICAL CARE IF:   You cough up clotted blood or bright red blood.  Your pain is not manageable or controlled by medicine.  You develop new difficulty breathing or chest pain.  You develop swelling in one or both ankles or legs, or swelling in your face or neck.  You develop headache or confusion. Document Released: 05/02/2000 Document Revised: 11/14/2012 Document Reviewed: 05/30/2013 [facility] Patient Information 2015 [place], [place]. This information is not intended to replace advice given to you by your health care provider. Make sure you discuss any questions you have with your health care provider.  Flexible Bronchoscopy Bronchoscopy is a procedure used to examine the passageways in the lungs. During the procedure a thin, flexible tool with a lens and camera or eyepiece is passed in your mouth or nose, down the windpipe (trachea), and into the air tubes (bronchi). This tool allows your health care provider to carefully look at your lungs from the inside and take diagnostic samples if needed.  LET [facility] CARE PROVIDER KNOW ABOUT:   Allergies to food or medicine.   All medicines you are taking, including blood thinners, vitamins, herbs, eye drops,  creams, and over-the-counter medicines.   Previous problems you or members of your family have had with the use of anesthetics.   Any blood disorders you have.   Previous surgeries you have had.   Medical conditions you have, including heart disease, diabetes, or kidney problems.   Possibility of pregnancy, if this applies. RISKS AND COMPLICATIONS Generally, this is a safe procedure. However, as with any procedure, problems can occur. Possible problems include:   Collapsed lung (pneumothorax).  Bleeding.  Increased need for oxygen or difficulty breathing after the procedure. BEFORE THE  PROCEDURE  Do not eat or drink anything after midnight on the night before the procedure or as directed by your health care provider.  PROCEDURE   Relax as much as possible during the procedure.  Medicines may be given to relax you, dry up your secretions, and control coughing.   A numbing medicine (local anesthetic) will be given to numb your mouth, nose, throat, and voice box (larynx). You will be able to breath normally during the procedure.   Samples of airway secretions may be collected for testing.  If abnormal areas are seen in your airways, tissue samples may be taken for examination under a microscope (biopsy).  If tissue samples are needed from the outer portions of the lung, a type of X-ray called fluoroscopy may be done.   If bleeding occurs, a drug may be used to stop or decrease the bleeding.  AFTER THE PROCEDURE   You may receive a chest X-ray following the procedure. This is to make sure the lungs have not collapsed (pneumothorax).  Document Released: 01/22/2000 Document Revised: 06/10/2013 Document Reviewed: 09/28/2012 [facility] Patient Information 2015 [place], [place]. This information is not intended to replace advice given to you by your health care provider. Make sure you discuss any questions you have with your health care provider.

## 2013-11-22 ENCOUNTER — Other Ambulatory Visit: Payer: Medicare Other

## 2013-11-22 ENCOUNTER — Other Ambulatory Visit: Payer: Self-pay | Admitting: *Deleted

## 2013-11-22 DIAGNOSIS — R911 Solitary pulmonary nodule: Secondary | ICD-10-CM

## 2013-11-22 LAB — BUN: BUN: 22 mg/dL (ref 6–23)

## 2013-11-22 LAB — CREATININE, SERUM: Creat: 0.8 mg/dL (ref 0.50–1.10)

## 2013-11-27 ENCOUNTER — Encounter: Payer: Self-pay | Admitting: *Deleted

## 2013-11-28 ENCOUNTER — Encounter (HOSPITAL_COMMUNITY)
Admission: RE | Admit: 2013-11-28 | Discharge: 2013-11-28 | Disposition: A | Discharge status: Home / Self Care | Payer: Medicare Other | Source: Ambulatory Visit | Attending: Cardiothoracic Surgery | Admitting: Cardiothoracic Surgery

## 2013-11-28 ENCOUNTER — Ambulatory Visit (HOSPITAL_COMMUNITY)
Admission: RE | Admit: 2013-11-28 | Discharge: 2013-11-28 | Disposition: A | Discharge status: Home / Self Care | Payer: Medicare Other | Source: Ambulatory Visit | Attending: Cardiothoracic Surgery | Admitting: Cardiothoracic Surgery

## 2013-11-28 DIAGNOSIS — R918 Other nonspecific abnormal finding of lung field: Secondary | ICD-10-CM | POA: Diagnosis not present

## 2013-11-28 DIAGNOSIS — J9811 Atelectasis: Secondary | ICD-10-CM | POA: Insufficient documentation

## 2013-11-28 DIAGNOSIS — J449 Chronic obstructive pulmonary disease, unspecified: Secondary | ICD-10-CM | POA: Diagnosis present

## 2013-11-28 DIAGNOSIS — R911 Solitary pulmonary nodule: Secondary | ICD-10-CM

## 2013-11-28 LAB — PULMONARY FUNCTION TEST
DL/VA % pred: 72 %
DL/VA: 3.27 ml/min/mmHg/L
DLCO unc % pred: 43 %
DLCO unc: 9.29 ml/min/mmHg
FEF 25-75 Post: 0.4 L/sec
FEF 25-75 Pre: 0.22 L/sec
FEF2575-%Change-Post: 84 %
FEF2575-%Pred-Post: 22 %
FEF2575-%Pred-Pre: 12 %
FEV1-%Change-Post: 15 %
FEV1-%Pred-Post: 45 %
FEV1-%Pred-Pre: 39 %
FEV1-Post: 0.94 L
FEV1-Pre: 0.81 L
FEV1FVC-%Change-Post: 1 %
FEV1FVC-%Pred-Pre: 61 %
FEV6-%Change-Post: 19 %
FEV6-%Pred-Post: 66 %
FEV6-%Pred-Pre: 56 %
FEV6-Post: 1.75 L
FEV6-Pre: 1.46 L
FEV6FVC-%Change-Post: 4 %
FEV6FVC-%Pred-Post: 92 %
FEV6FVC-%Pred-Pre: 88 %
FVC-%Change-Post: 13 %
FVC-%Pred-Post: 72 %
FVC-%Pred-Pre: 63 %
FVC-Post: 1.97 L
FVC-Pre: 1.73 L
Post FEV1/FVC ratio: 48 %
Post FEV6/FVC ratio: 89 %
Pre FEV1/FVC ratio: 47 %
Pre FEV6/FVC Ratio: 85 %
RV % pred: 144 %
RV: 3.04 L
TLC % pred: 104 %
TLC: 4.96 L

## 2013-11-28 LAB — GLUCOSE, CAPILLARY: Glucose-Capillary: 93 mg/dL (ref 70–99)

## 2013-11-28 MED ORDER — ALBUTEROL SULFATE (2.5 MG/3ML) 0.083% IN NEBU
2.5000 mg | INHALATION_SOLUTION | Freq: Once | RESPIRATORY_TRACT | Status: AC
  Administered 2013-11-28: 2.5 mg via RESPIRATORY_TRACT

## 2013-11-28 MED ORDER — FLUDEOXYGLUCOSE F - 18 (FDG) INJECTION
9.3000 | Freq: Once | INTRAVENOUS | Status: AC | PRN
  Administered 2013-11-28: 9.3 via INTRAVENOUS

## 2013-11-28 NOTE — Pre-Procedure Instructions (Signed)
Jennamarie A Lingafelter  11/28/2013   Your procedure is scheduled on: Tuesday, Oct.27th   Report to [facility] Admitting at  5:30 AM.   Call this number if you have problems the morning of surgery: [phone]   Remember:   Do not eat food or drink liquids after midnight Monday.   Take these medicines the morning of surgery with A SIP OF WATER: Metoprolol.  Please use your inhaler & Spiriva   Do not wear jewelry, make-up or nail polish.  Do not wear lotions, powders, or perfumes. You may NOT wear deodorant the morning of surgery.  Do not shave underarms & legs 48 hours prior to surgery.    Do not bring valuables to the hospital.  [facility] is not responsible for any belongings or valuables.               Contacts, dentures or bridgework may not be worn into surgery.  Leave suitcase in the car. After surgery it may be brought to your room.  For patients admitted to the hospital, discharge time is determined by your treatment team.    Name and phone number of your driver:    Special Instructions: "Preparing for Surgery" Instruction sheet.   Please read over the following fact sheets that you were given: Pain Booklet, Coughing and Deep Breathing and Surgical Site Infection Prevention

## 2013-11-29 ENCOUNTER — Encounter (HOSPITAL_COMMUNITY): Payer: Self-pay

## 2013-11-29 ENCOUNTER — Encounter (HOSPITAL_COMMUNITY)
Admission: RE | Admit: 2013-11-29 | Discharge: 2013-11-29 | Disposition: A | Discharge status: Home / Self Care | Payer: Medicare Other | Source: Ambulatory Visit | Attending: Cardiothoracic Surgery | Admitting: Cardiothoracic Surgery

## 2013-11-29 ENCOUNTER — Ambulatory Visit (HOSPITAL_COMMUNITY)
Admission: RE | Admit: 2013-11-29 | Discharge: 2013-11-29 | Disposition: A | Discharge status: Home / Self Care | Payer: Medicare Other | Source: Ambulatory Visit | Attending: Cardiothoracic Surgery | Admitting: Cardiothoracic Surgery

## 2013-11-29 VITALS — BP 133/74 | HR 79 | Temp 98.0°F | Resp 20 | Ht 62.0 in | Wt 173.5 lb

## 2013-11-29 DIAGNOSIS — R911 Solitary pulmonary nodule: Secondary | ICD-10-CM

## 2013-11-29 DIAGNOSIS — J449 Chronic obstructive pulmonary disease, unspecified: Secondary | ICD-10-CM | POA: Diagnosis not present

## 2013-11-29 DIAGNOSIS — R918 Other nonspecific abnormal finding of lung field: Secondary | ICD-10-CM | POA: Diagnosis not present

## 2013-11-29 DIAGNOSIS — J9811 Atelectasis: Secondary | ICD-10-CM | POA: Diagnosis not present

## 2013-11-29 HISTORY — DX: Anxiety disorder, unspecified: F41.9

## 2013-11-29 HISTORY — DX: Pneumonia, unspecified organism: J18.9

## 2013-11-29 LAB — COMPREHENSIVE METABOLIC PANEL
ALT: 18 U/L (ref 0–35)
AST: 18 U/L (ref 0–37)
Albumin: 3.8 g/dL (ref 3.5–5.2)
Alkaline Phosphatase: 74 U/L (ref 39–117)
Anion gap: 13 (ref 5–15)
BUN: 22 mg/dL (ref 6–23)
CO2: 27 mEq/L (ref 19–32)
Calcium: 9.7 mg/dL (ref 8.4–10.5)
Chloride: 100 mEq/L (ref 96–112)
Creatinine, Ser: 0.67 mg/dL (ref 0.50–1.10)
GFR calc Af Amer: >90 mL/min (ref >90)
GFR calc non Af Amer: 87 mL/min — ABNORMAL LOW (ref >90)
Glucose, Bld: 97 mg/dL (ref 70–99)
Potassium: 4.4 mEq/L (ref 3.7–5.3)
Sodium: 140 mEq/L (ref 137–147)
Total Bilirubin: 0.4 mg/dL (ref 0.3–1.2)
Total Protein: 7.5 g/dL (ref 6.0–8.3)

## 2013-11-29 LAB — CBC
HCT: 39.8 % (ref 36.0–46.0)
Hemoglobin: 13.2 g/dL (ref 12.0–15.0)
MCH: 29.1 pg (ref 26.0–34.0)
MCHC: 33.2 g/dL (ref 30.0–36.0)
MCV: 87.7 fL (ref 78.0–100.0)
Platelets: 280 10*3/uL (ref 150–400)
RBC: 4.54 MIL/uL (ref 3.87–5.11)
RDW: 14.1 % (ref 11.5–15.5)
WBC: 7.7 10*3/uL (ref 4.0–10.5)

## 2013-11-29 LAB — APTT: aPTT: 31 seconds (ref 24–37)

## 2013-11-29 LAB — PROTIME-INR
INR: 1.13 (ref 0.00–1.49)
Prothrombin Time: 14.6 seconds (ref 11.6–15.2)

## 2013-11-29 MED ORDER — GADOBENATE DIMEGLUMINE 529 MG/ML IV SOLN
16.0000 mL | Freq: Once | INTRAVENOUS | Status: AC | PRN
  Administered 2013-11-29: 16 mL via INTRAVENOUS

## 2013-11-29 NOTE — Pre-Procedure Instructions (Signed)
Tammie Clay  11/29/2013   Your procedure is scheduled on: Tuesday December 03, 2013 at 7:30 AM.   Report to [facility] Admitting at  5:30 AM.   Call this number if you have problems the morning of surgery: [phone]  Call this number if you have any questions prior to surgery: [phone]  Remember:   Do not eat food or drink liquids after midnight Monday.   Take these medicines the morning of surgery with A SIP OF WATER: Metoprolol (Toprol XL).  Please use your Albuterol inhaler if needed & Spiriva inhaler.   Please discontinue vitamins and fish oil   Do not wear jewelry, make-up or nail polish.  Do not wear lotions, powders, or perfumes. You may NOT wear deodorant the morning of surgery.  Do not shave underarms & legs 48 hours prior to surgery.   Do not bring valuables to the hospital.  [facility] is not responsible for any belongings or valuables.               Contacts, dentures or bridgework may not be worn into surgery.  Leave suitcase in the car. After surgery it may be brought to your room.  For patients admitted to the hospital, discharge time is determined by your treatment team.    Name and phone number of your driver: Family/Friend   Special Instructions: "Preparing for Surgery" Instruction sheet.   Please read over the following fact sheets that you were given: Pain Booklet, Coughing and Deep Breathing and Surgical Site Infection Prevention

## 2013-11-29 NOTE — Progress Notes (Signed)
PCP is Mayra Neer. Patient denied having any cardiac or pulmonary issues.

## 2013-12-03 ENCOUNTER — Encounter (HOSPITAL_COMMUNITY): Payer: Self-pay | Admitting: Surgery

## 2013-12-03 ENCOUNTER — Ambulatory Visit (HOSPITAL_COMMUNITY)
Admission: RE | Admit: 2013-12-03 | Discharge: 2013-12-03 | Disposition: A | Discharge status: Home / Self Care | Payer: Medicare Other | Source: Ambulatory Visit | Attending: Cardiothoracic Surgery | Admitting: Cardiothoracic Surgery

## 2013-12-03 ENCOUNTER — Encounter (HOSPITAL_COMMUNITY)
Admission: RE | Disposition: A | Discharge status: Home / Self Care | Payer: Self-pay | Source: Ambulatory Visit | Attending: Cardiothoracic Surgery

## 2013-12-03 ENCOUNTER — Encounter (HOSPITAL_COMMUNITY): Payer: Medicare Other | Admitting: Certified Registered Nurse Anesthetist

## 2013-12-03 ENCOUNTER — Ambulatory Visit (HOSPITAL_COMMUNITY): Payer: Medicare Other | Admitting: Certified Registered Nurse Anesthetist

## 2013-12-03 DIAGNOSIS — M8580 Other specified disorders of bone density and structure, unspecified site: Secondary | ICD-10-CM | POA: Insufficient documentation

## 2013-12-03 DIAGNOSIS — R222 Localized swelling, mass and lump, trunk: Secondary | ICD-10-CM

## 2013-12-03 DIAGNOSIS — J449 Chronic obstructive pulmonary disease, unspecified: Secondary | ICD-10-CM | POA: Insufficient documentation

## 2013-12-03 DIAGNOSIS — Z87891 Personal history of nicotine dependence: Secondary | ICD-10-CM | POA: Diagnosis not present

## 2013-12-03 DIAGNOSIS — E785 Hyperlipidemia, unspecified: Secondary | ICD-10-CM | POA: Insufficient documentation

## 2013-12-03 DIAGNOSIS — I10 Essential (primary) hypertension: Secondary | ICD-10-CM | POA: Diagnosis not present

## 2013-12-03 DIAGNOSIS — C3412 Malignant neoplasm of upper lobe, left bronchus or lung: Secondary | ICD-10-CM | POA: Diagnosis not present

## 2013-12-03 DIAGNOSIS — K219 Gastro-esophageal reflux disease without esophagitis: Secondary | ICD-10-CM | POA: Insufficient documentation

## 2013-12-03 DIAGNOSIS — R599 Enlarged lymph nodes, unspecified: Secondary | ICD-10-CM | POA: Diagnosis present

## 2013-12-03 DIAGNOSIS — R918 Other nonspecific abnormal finding of lung field: Secondary | ICD-10-CM | POA: Diagnosis present

## 2013-12-03 HISTORY — PX: VIDEO BRONCHOSCOPY WITH ENDOBRONCHIAL ULTRASOUND: SHX6177

## 2013-12-03 SURGERY — BRONCHOSCOPY, WITH EBUS
Anesthesia: General

## 2013-12-03 MED ORDER — PHENYLEPHRINE HCL 10 MG/ML IJ SOLN
10.0000 mg | INTRAVENOUS | Status: DC | PRN
  Administered 2013-12-03: 30 ug/min via INTRAVENOUS

## 2013-12-03 MED ORDER — OXYCODONE HCL 5 MG PO TABS: 5.0000 mg | ORAL_TABLET | Freq: Once | ORAL | Status: DC | PRN

## 2013-12-03 MED ORDER — EPHEDRINE SULFATE 50 MG/ML IJ SOLN
INTRAMUSCULAR | Status: DC | PRN
  Administered 2013-12-03: 5 mg via INTRAVENOUS

## 2013-12-03 MED ORDER — ARTIFICIAL TEARS OP OINT
TOPICAL_OINTMENT | OPHTHALMIC | Status: AC
  Filled 2013-12-03: qty 3.5

## 2013-12-03 MED ORDER — DEXAMETHASONE SODIUM PHOSPHATE 4 MG/ML IJ SOLN
INTRAMUSCULAR | Status: AC
  Filled 2013-12-03: qty 1

## 2013-12-03 MED ORDER — SUCCINYLCHOLINE CHLORIDE 20 MG/ML IJ SOLN
INTRAMUSCULAR | Status: AC
  Filled 2013-12-03: qty 1

## 2013-12-03 MED ORDER — LIDOCAINE HCL 4 % MT SOLN
OROMUCOSAL | Status: DC | PRN
  Administered 2013-12-03: 2 mL via TOPICAL

## 2013-12-03 MED ORDER — ARTIFICIAL TEARS OP OINT
TOPICAL_OINTMENT | OPHTHALMIC | Status: DC | PRN
  Administered 2013-12-03: 1 via OPHTHALMIC

## 2013-12-03 MED ORDER — LACTATED RINGERS IV SOLN
INTRAVENOUS | Status: DC | PRN
  Administered 2013-12-03: 07:00:00 via INTRAVENOUS

## 2013-12-03 MED ORDER — 0.9 % SODIUM CHLORIDE (POUR BTL) OPTIME
TOPICAL | Status: DC | PRN
  Administered 2013-12-03: 1000 mL

## 2013-12-03 MED ORDER — LIDOCAINE HCL (CARDIAC) 20 MG/ML IV SOLN
INTRAVENOUS | Status: AC
  Filled 2013-12-03: qty 5

## 2013-12-03 MED ORDER — ONDANSETRON HCL 4 MG/2ML IJ SOLN
INTRAMUSCULAR | Status: DC | PRN
  Administered 2013-12-03: 4 mg via INTRAVENOUS

## 2013-12-03 MED ORDER — MIDAZOLAM HCL 2 MG/2ML IJ SOLN: 0.5000 mg | Freq: Once | INTRAMUSCULAR | Status: DC | PRN

## 2013-12-03 MED ORDER — FENTANYL CITRATE 0.05 MG/ML IJ SOLN
INTRAMUSCULAR | Status: AC
  Filled 2013-12-03: qty 5

## 2013-12-03 MED ORDER — DIPHENHYDRAMINE HCL 50 MG/ML IJ SOLN
INTRAMUSCULAR | Status: AC
  Filled 2013-12-03: qty 1

## 2013-12-03 MED ORDER — PROPOFOL 10 MG/ML IV BOLUS
INTRAVENOUS | Status: AC
  Filled 2013-12-03: qty 20

## 2013-12-03 MED ORDER — LIDOCAINE HCL (CARDIAC) 20 MG/ML IV SOLN
INTRAVENOUS | Status: DC | PRN
  Administered 2013-12-03: 20 mg via INTRAVENOUS

## 2013-12-03 MED ORDER — MEPERIDINE HCL 25 MG/ML IJ SOLN: 6.2500 mg | INTRAMUSCULAR | Status: DC | PRN

## 2013-12-03 MED ORDER — SODIUM CHLORIDE 0.9 % IJ SOLN
INTRAMUSCULAR | Status: AC
  Filled 2013-12-03: qty 10

## 2013-12-03 MED ORDER — EPHEDRINE SULFATE 50 MG/ML IJ SOLN
INTRAMUSCULAR | Status: AC
  Filled 2013-12-03: qty 1

## 2013-12-03 MED ORDER — ONDANSETRON HCL 4 MG/2ML IJ SOLN
INTRAMUSCULAR | Status: AC
  Filled 2013-12-03: qty 2

## 2013-12-03 MED ORDER — FENTANYL CITRATE 0.05 MG/ML IJ SOLN: 25.0000 ug | INTRAMUSCULAR | Status: DC | PRN

## 2013-12-03 MED ORDER — MIDAZOLAM HCL 2 MG/2ML IJ SOLN
INTRAMUSCULAR | Status: AC
  Filled 2013-12-03: qty 2

## 2013-12-03 MED ORDER — PROPOFOL 10 MG/ML IV BOLUS
INTRAVENOUS | Status: DC | PRN
  Administered 2013-12-03: 120 mg via INTRAVENOUS

## 2013-12-03 MED ORDER — EPINEPHRINE HCL 1 MG/ML IJ SOLN
INTRAMUSCULAR | Status: DC | PRN
  Administered 2013-12-03: 1 mg via ENDOTRACHEOPULMONARY

## 2013-12-03 MED ORDER — DIPHENHYDRAMINE HCL 50 MG/ML IJ SOLN
10.0000 mg | Freq: Once | INTRAMUSCULAR | Status: AC
  Administered 2013-12-03: 10 mg via INTRAVENOUS

## 2013-12-03 MED ORDER — ROCURONIUM BROMIDE 50 MG/5ML IV SOLN
INTRAVENOUS | Status: AC
  Filled 2013-12-03: qty 1

## 2013-12-03 MED ORDER — GLYCOPYRROLATE 0.2 MG/ML IJ SOLN
INTRAMUSCULAR | Status: DC | PRN
Start: 2013-12-03 — End: 2013-12-03
  Administered 2013-12-03: .4 mg via INTRAVENOUS

## 2013-12-03 MED ORDER — PHENYLEPHRINE 40 MCG/ML (10ML) SYRINGE FOR IV PUSH (FOR BLOOD PRESSURE SUPPORT)
PREFILLED_SYRINGE | INTRAVENOUS | Status: AC
  Filled 2013-12-03: qty 10

## 2013-12-03 MED ORDER — DEXAMETHASONE SODIUM PHOSPHATE 4 MG/ML IJ SOLN
INTRAMUSCULAR | Status: DC | PRN
  Administered 2013-12-03: 4 mg via INTRAVENOUS

## 2013-12-03 MED ORDER — NEOSTIGMINE METHYLSULFATE 10 MG/10ML IV SOLN
INTRAVENOUS | Status: DC | PRN
  Administered 2013-12-03: 3 mg via INTRAVENOUS

## 2013-12-03 MED ORDER — EPINEPHRINE HCL 1 MG/ML IJ SOLN
INTRAMUSCULAR | Status: AC
  Filled 2013-12-03: qty 1

## 2013-12-03 MED ORDER — PROMETHAZINE HCL 25 MG/ML IJ SOLN: 6.2500 mg | INTRAMUSCULAR | Status: DC | PRN

## 2013-12-03 MED ORDER — OXYCODONE HCL 5 MG/5ML PO SOLN: 5.0000 mg | Freq: Once | ORAL | Status: DC | PRN

## 2013-12-03 MED ORDER — GLYCOPYRROLATE 0.2 MG/ML IJ SOLN
INTRAMUSCULAR | Status: AC
  Filled 2013-12-03: qty 3

## 2013-12-03 MED ORDER — PHENYLEPHRINE HCL 10 MG/ML IJ SOLN: INTRAMUSCULAR | Status: DC | PRN

## 2013-12-03 MED ORDER — MIDAZOLAM HCL 5 MG/5ML IJ SOLN
INTRAMUSCULAR | Status: DC | PRN
  Administered 2013-12-03: 1 mg via INTRAVENOUS

## 2013-12-03 MED ORDER — LIDOCAINE HCL (CARDIAC) 20 MG/ML IV SOLN
INTRAVENOUS | Status: AC
Start: 2013-12-03 — End: 2013-12-03
  Filled 2013-12-03: qty 5

## 2013-12-03 MED ORDER — FENTANYL CITRATE 0.05 MG/ML IJ SOLN
INTRAMUSCULAR | Status: DC | PRN
  Administered 2013-12-03: 100 ug via INTRAVENOUS
  Administered 2013-12-03: 50 ug via INTRAVENOUS

## 2013-12-03 MED ORDER — PHENYLEPHRINE HCL 10 MG/ML IJ SOLN
INTRAMUSCULAR | Status: DC | PRN
  Administered 2013-12-03: 80 ug via INTRAVENOUS

## 2013-12-03 MED ORDER — ROCURONIUM BROMIDE 100 MG/10ML IV SOLN
INTRAVENOUS | Status: DC | PRN
  Administered 2013-12-03: 40 mg via INTRAVENOUS

## 2013-12-03 SURGICAL SUPPLY — 26 items
BRUSH CYTOL CELLEBRITY 1.5X140 (MISCELLANEOUS) IMPLANT
CANISTER SUCTION 2500CC (MISCELLANEOUS) ×2 IMPLANT
CONT SPEC 4OZ CLIKSEAL STRL BL (MISCELLANEOUS) ×2 IMPLANT
COVER TABLE BACK 60X90 (DRAPES) ×2 IMPLANT
FILTER STRAW FLUID ASPIR (MISCELLANEOUS) ×1 IMPLANT
FORCEPS BIOP RJ4 1.8 (CUTTING FORCEPS) ×1 IMPLANT
GAUZE SPONGE 4X4 12PLY STRL (GAUZE/BANDAGES/DRESSINGS) ×2 IMPLANT
GLOVE BIO SURGEON STRL SZ 6.5 (GLOVE) ×3 IMPLANT
GOWN L4 XLG 20 PK N/S (GOWN DISPOSABLE) ×2 IMPLANT
KIT CLEAN ENDO COMPLIANCE (KITS) ×4 IMPLANT
KIT ROOM TURNOVER OR (KITS) ×2 IMPLANT
MARKER SKIN DUAL TIP RULER LAB (MISCELLANEOUS) ×2 IMPLANT
NDL BIOPSY TRANSBRONCH 21G (NEEDLE) IMPLANT
NDL BLUNT 18X1 FOR OR ONLY (NEEDLE) IMPLANT
NEEDLE BIOPSY TRANSBRONCH 21G (NEEDLE) IMPLANT
NEEDLE BLUNT 18X1 FOR OR ONLY (NEEDLE) IMPLANT
NEEDLE SYS SONOTIP II EBUSTBNA (NEEDLE) ×2 IMPLANT
NS IRRIG 1000ML POUR BTL (IV SOLUTION) ×2 IMPLANT
OIL SILICONE PENTAX (PARTS (SERVICE/REPAIRS)) ×2 IMPLANT
PAD ARMBOARD 7.5X6 YLW CONV (MISCELLANEOUS) ×4 IMPLANT
SYR 20CC LL (SYRINGE) ×3 IMPLANT
SYR 20ML ECCENTRIC (SYRINGE) ×2 IMPLANT
SYRINGE 3CC LL L/F (MISCELLANEOUS) ×1 IMPLANT
TOWEL OR 17X24 6PK STRL BLUE (TOWEL DISPOSABLE) ×2 IMPLANT
TRAP SPECIMEN MUCOUS 40CC (MISCELLANEOUS) ×2 IMPLANT
TUBE CONNECTING 12X1/4 (SUCTIONS) ×4 IMPLANT

## 2013-12-03 NOTE — Anesthesia Postprocedure Evaluation (Signed)
  Anesthesia Post-op Note  Patient: Tammie Clay  Procedure(s) Performed: Procedure(s): VIDEO BRONCHOSCOPY WITH ENDOBRONCHIAL ULTRASOUND WITH BIOPSIES (N/A)  Patient Location: PACU  Anesthesia Type:General  Level of Consciousness: awake, alert , oriented and patient cooperative  Airway and Oxygen Therapy: Patient Spontanous Breathing and Patient connected to nasal cannula oxygen  Post-op Pain: none  Post-op Assessment: Post-op Vital signs reviewed, Patient's Cardiovascular Status Stable, Respiratory Function Stable, Patent Airway, No signs of Nausea or vomiting and Pain level controlled  Post-op Vital Signs: Reviewed and stable  Last Vitals:  Filed Vitals:   12/03/13 0845  BP: 115/73  Pulse: 86  Temp:   Resp: 31    Complications: No apparent anesthesia complications

## 2013-12-03 NOTE — Anesthesia Procedure Notes (Addendum)
Procedure Name: Intubation Date/Time: 12/03/2013 7:34 AM Performed by: Garner Nash Pre-anesthesia Checklist: Patient identified, Timeout performed, Emergency Drugs available, Suction available and Patient being monitored Patient Re-evaluated:Patient Re-evaluated prior to inductionOxygen Delivery Method: Circle system utilized Preoxygenation: Pre-oxygenation with 100% oxygen Intubation Type: IV induction Ventilation: Mask ventilation without difficulty Laryngoscope Size: Mac and 3 Grade View: Grade III Tube type: Oral Tube size: 8.0 mm Number of attempts: 1 Airway Equipment and Method: LTA kit utilized and Stylet Placement Confirmation: ETT inserted through vocal cords under direct vision,  positive ETCO2 and breath sounds checked- equal and bilateral Secured at: 21 cm Tube secured with: Tape Dental Injury: Teeth and Oropharynx as per pre-operative assessment

## 2013-12-03 NOTE — Brief Op Note (Signed)
      [address]       [place], [address]             [phone]    12/03/2013  8:30 AM  PATIENT:  Tammie Clay  68 y.o. female  PRE-OPERATIVE DIAGNOSIS:  Left upper lobe lung mass, hilum mass  POST-OPERATIVE DIAGNOSIS:  Left upper lobe lung mass, hilum mass, non small cell lung cancer by cytology final path pending  PROCEDURE:  Procedure(s): VIDEO BRONCHOSCOPY WITH ENDOBRONCHIAL ULTRASOUND WITH BIOPSIES (N/A)  SURGEON:  Surgeon(s) and Role:    * Grace Isaac, MD - Primary   ANESTHESIA:   general  EBL:    none   BLOOD ADMINISTERED:none  DRAINS: none   LOCAL MEDICATIONS USED:  NONE  SPECIMEN:  Source of Specimen:  left upper lobe bx and # 7 mediastinal lymphnode   DISPOSITION OF SPECIMEN:  PATHOLOGY  COUNTS:  YES  DICTATION: .Other Dictation: Dictation Number .  PLAN OF CARE: Discharge to home after PACU  PATIENT DISPOSITION:  PACU - hemodynamically stable.   Delay start of Pharmacological VTE agent (>24hrs) due to surgical blood loss or risk of bleeding: yes

## 2013-12-03 NOTE — Transfer of Care (Signed)
Immediate Anesthesia Transfer of Care Note  Patient: Tammie Clay  Procedure(s) Performed: Procedure(s): VIDEO BRONCHOSCOPY WITH ENDOBRONCHIAL ULTRASOUND WITH BIOPSIES (N/A)  Patient Location: PACU  Anesthesia Type:General  Level of Consciousness: awake, alert  and oriented  Airway & Oxygen Therapy: Patient Spontanous Breathing and Patient connected to nasal cannula oxygen  Post-op Assessment: Report given to PACU RN and Post -op Vital signs reviewed and stable  Post vital signs: Reviewed and stable  Complications: No apparent anesthesia complications

## 2013-12-03 NOTE — H&P (Signed)
[address]       [place] [address]             [phone]                    [name] [place] Medical Record #[id number] Date of Birth: [birth year]  Referring:Dr Mayra Neer Primary Care: Mayra Neer, MD  Chief Complaint:    Left lung lesion   History of Present Illness:    Tammie Clay 68 y.o. female  Was seen in the office for new diagnosis of left upper lobe lung lesion with question of mediastinal involvement. The patient has been a long term smoker, quit one year ago but prior smoked for 55 years. She has no occupational exposure to asbestos.   She had increasing belching and discomfort in her left chest when she lives on her left side, she mentioned this at her yearly checkup. A chest x-ray was obtained suggesting a left hilar mass.   Patient presents to the thoracic surgical office for evaluation and treatment.   Current Activity/ Functional Status:  Patient is independent with mobility/ambulation, transfers, ADL's, IADL's.   Zubrod Score: At the time of surgery this patient's most appropriate activity status/level should be described as: []     0    Normal activity, no symptoms [x]     1    Restricted in physical strenuous activity but ambulatory, able to do out light work []     2    Ambulatory and capable of self care, unable to do work activities, up and about               >50 % of waking hours                              []     3    Only limited self care, in bed greater than 50% of waking hours []     4    Completely disabled, no self care, confined to bed or chair []     5    Moribund   Past Medical History  Diagnosis Date  . Hypertension   . COPD (chronic obstructive pulmonary disease)   . Hyperlipidemia   . GERD (gastroesophageal reflux disease)   . Osteopenia   . Insomnia   . Mediastinal lymphadenopathy 11/19/13    PER CT  . Multiple lung nodules on CT 11/19/13    LEFT LUNG BASE  . Coronary artery  calcification seen on CAT scan 11/21/2013  . COPD (chronic obstructive pulmonary disease) 11/21/2013     Past Surgical History  Procedure Laterality Date  .  c section x 2    . Urethral cyst removed    . Tonsillectomy    . Appendectomy    . Colonoscopy with propofol N/A 07/17/2012    Procedure: COLONOSCOPY WITH PROPOFOL;  Surgeon: Garlan Fair, MD;  Location: [facility];  Service: Endoscopy;  Laterality: N/A;  . Esophagogastroduodenoscopy (egd) with propofol N/A 07/17/2012    Procedure: ESOPHAGOGASTRODUODENOSCOPY (EGD) WITH PROPOFOL;  Surgeon: Garlan Fair, MD;  Location: [facility];  Service: Endoscopy;  Laterality: N/A;    Family History  Problem Relation Age of Onset  . Diabetes Father   . Heart disease Father     MI  . CVA Mother   . Cancer Cousin     LIPOSARCOMA  . Heart disease Cousin  HEART TRANSPLANTS X 2    History   Social History  . Marital Status: Divorced    Spouse Name: N/A    Number of Children: 3  . Years of Education: N/A   Occupational History  .  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 Smoking status: Former Smoker -- 0.50 packs/day for 50 years    Types: Cigarettes    Quit date: 10/08/2012  . Smokeless tobacco: Never Used     Comment: smokes Vapor daily  . Alcohol Use: No  . Drug Use: No  . Sexual Activity: Not on file     History  Smoking status  . Former Smoker -- 0.50 packs/day for 50 years  . Types: Cigarettes  . Quit date: 10/08/2012  Smokeless tobacco  . Never Used    Comment: smokes Vapor daily    History  Alcohol Use  . Yes    Comment: social     No Known Allergies  No current facility-administered medications for this encounter.     Review of Systems:     Cardiac Review of Systems: Y or N  Chest Pain Blue.Reese    ]  Resting SOB [ n  ] Exertional SOB  [  y]  Orthopnea Florencio.Farrier  ]     Pedal Edema [  n ]    Palpitations [ n ] Syncope  [ n ]   Presyncope [ n  ]  General Review of Systems: [Y] = yes [  ]=no Constitional: recent weight change [n ];  Wt loss over the last 3 months [   ] anorexia [  ]; fatigue Blue.Reese  ]; nausea [  n]; night sweats [  ]; fever [ n ]; or chills [ n ];          Dental: poor dentition[  ]; Last Dentist visit:   Eye : blurred vision [  ]; diplopia [   ]; vision changes [  ];  Amaurosis fugax[  ]; Resp: cough Blue.Reese  ];  wheezing[ n ];  hemoptysis[n  ]; shortness of breath[ y ]; paroxysmal nocturnal dyspnea[ n ]; dyspnea on exertion[  y]; or orthopnea[  ];  GI:  gallstones[ n ], vomiting[ n ];  dysphagia[n  ]; melena[ n ];  hematochezia [  ]; heartburn[  ];   Hx of  Colonoscopy[ y ]; GU: kidney stones [  ]; hematuria[  ];   dysuria [  ];  nocturia[  ];  history of     obstruction [  ]; urinary frequency [  ]             Skin: rash, swelling[  ];, hair loss[  ];  peripheral edema[  ];  or itching[  ]; Musculosketetal: myalgias[  ];  joint swelling[  ];  joint erythema[n  ];  joint pain[  ];  back pain[  ];  Heme/Lymph: bruising[  ];  bleeding[  ];  anemia[  ];  Neuro: TIA[  ];  headaches[  ];  stroke[  ];  vertigo[  ];  seizures[  ];   paresthesias[  ];  difficulty walking[ n ];  Psych:depression[  ]; anxiety[  ];  Endocrine: diabetes[  ];  thyroid dysfunction[  ];  Immunizations: Flu up to date Blue.Reese  ]; Pneumococcal up to date Blue.Reese  ];  Other:  Physical Exam: BP 137/85  Pulse 74  Temp(Src) 97.8 F (36.6 C) (Oral)  Resp 20  Ht 5\' 2"  (1.575 m)  Wt 173 lb (78.472 kg)  BMI 31.63 kg/m2  SpO2 100%  PHYSICAL EXAMINATION:  General appearance: alert and cooperative Neurologic: intact Heart: regular rate and rhythm, S1, S2 normal, no murmur, click, rub or gallop Lungs: diminished breath sounds LUL Abdomen: soft, non-tender; bowel sounds normal; no masses,  no organomegaly Extremities: extremities normal, atraumatic, no cyanosis or edema and Homans sign is  negative, no sign of DVT Patient has no cervical or supraclavicular or axillary adenopathy She has no abdominal aneurysm that is palpable PT and DP pulses are palpable bilaterally  Diagnostic Studies & Laboratory data:     Recent Radiology Findings:  Dg Chest 2 View Within Previous 72 Hours.  Films Obtained On Friday Are Acceptable For Monday And Tuesday Cases  11/29/2013   CLINICAL DATA:  Left upper lobe mass.  Patient for VATS.  EXAM: CHEST  2 VIEW  COMPARISON:  CT chest 11/28/2013.  PA and lateral chest 11/15/2013.  FINDINGS: Opacity projected in the left hilum correlates with left upper lobe mass seen on the prior studies. Calcified granuloma in the right upper lobe is noted. The lungs are otherwise clear. Heart size is normal. No pneumothorax or pleural effusion. No focal bony abnormality.  IMPRESSION: No acute finding in patient with a left upper lobe mass.   Electronically Signed   By: Inge Rise M.D.   On: 11/29/2013 14:21   Dg Chest 2 View  11/15/2013   CLINICAL DATA:  Left upper quadrant pain with chest discomfort. COPD.  EXAM: CHEST  2 VIEW  COMPARISON:  03/29/2012 and 03/17/2006  FINDINGS: Lungs are hypoinflated and demonstrate increased density with somewhat sharp a lobular lateral border over the left hilar region. Couple small stable right perihilar nodules likely calcified granulomas unchanged from 2008. No evidence of pleural effusion. Cardiac silhouette is within normal. There is mild calcified plaque over the thoracic aorta. There are mild degenerative changes of the spine.  IMPRESSION: Moderate prominence of the left hilum with lobulated lateral border suspicious for left hilar mass/adenopathy. Recommend contrast-enhanced chest CT for further evaluation.   Electronically Signed   By: Marin Olp M.D.   On: 11/15/2013 15:20   Ct Chest W Contrast  11/19/2013   CLINICAL DATA:  Left hilar mass on chest radiograph. Left upper quadrant pain.  EXAM: CT CHEST WITH CONTRAST   TECHNIQUE: Multidetector CT imaging of the chest was performed during intravenous contrast administration.  CONTRAST:  39mL OMNIPAQUE IOHEXOL 300 MG/ML  SOLN  COMPARISON:  Chest radiograph on 11/15/2013  FINDINGS: Mediastinum/Hilar Regions: Mediastinal lymphadenopathy is seen in the lateral aortic region measuring 1.6 x 4.3 cm on image 20. Sub- carina lymphadenopathy seen measuring 2.1 cm on image 26.  Other Thoracic Lymphadenopathy:  None.  Lungs: Left upper lobe collapse is seen with a central low-attenuation mass measuring 3.9 x 4.2 cm. This is contiguous with the left hilum obstructs the central left upper lobe bronchus.  In addition, there is an 8 mm indeterminate nodular density in the anterior left lung base on image 38. There are also several subpleural pulmonary nodular densities in the left lower lobe, largest measuring 7 mm on image 44.  Pleura:  Small left pleural effusion noted.  Vascular/Cardiac:  No acute findings identified.  Musculoskeletal:  No suspicious bone lesions identified.  Other:  None.  IMPRESSION: 4 cm central left upper lobe mass  which involves the hilum and causes left upper lobe collapse. This is consistent with primary bronchogenic carcinoma. Consider bronchoscopy for tissue diagnosis.  Mediastinal lymphadenopathy, consistent with metastatic disease.  Small left pleural effusion.  Indeterminate pulmonary nodules in the left lung base, largest measuring 8 mm. PET-CT scan should be considered for further evaluation.   Electronically Signed   By: Earle Gell M.D.   On: 11/19/2013 11:41   Mr Jeri Cos FY Contrast  11/30/2013   CLINICAL DATA:  History of left upper lobe lung lesion and COPD.  EXAM: MRI HEAD WITHOUT AND WITH CONTRAST  TECHNIQUE: Multiplanar, multiecho pulse sequences of the brain and surrounding structures were obtained without and with intravenous contrast.  CONTRAST:  42mL MULTIHANCE GADOBENATE DIMEGLUMINE 529 MG/ML IV SOLN  COMPARISON:  None.  FINDINGS: Mild diffuse  prominence of the CSF containing spaces compatible with generalized atrophy. Scattered and confluent T2/FLAIR hyperintensity within the periventricular and deep white matter are noted, nonspecific, but likely related to mild chronic small vessel ischemic changes.  No focal parenchymal signal abnormality is identified. No mass lesion, midline shift, or extra-axial fluid collection. Ventricles are normal in size without evidence of hydrocephalus. No abnormal enhancement.  No diffusion-weighted signal abnormality is identified to suggest acute intracranial infarct. Gray-white matter differentiation is maintained. Normal flow voids are seen within the intracranial vasculature. No intracranial hemorrhage identified.  The cervicomedullary junction is normal. Pituitary gland is within normal limits. Pituitary stalk is midline. The globes and optic nerves demonstrate a normal appearance with normal signal intensity. The  The bone marrow signal intensity is normal. Calvarium is intact. Visualized upper cervical spine is within normal limits.  Scalp soft tissues are unremarkable.  Paranasal sinuses are clear.  No mastoid effusion.  IMPRESSION: 1. No acute intracranial process identified. Specifically, no mass lesion or evidence of intracranial metastasis. 2. Mild age-related atrophy with chronic small vessel ischemic disease.   Electronically Signed   By: Jeannine Boga M.D.   On: 11/30/2013 06:37   Nm Pet Image Initial (pi) Skull Base To Thigh  11/28/2013   CLINICAL DATA:  Initial treatment strategy for Lung cancer.  EXAM: NUCLEAR MEDICINE PET SKULL BASE TO THIGH  TECHNIQUE: 9.3 mCi F-18 FDG was injected intravenously. Full-ring PET imaging was performed from the skull base to thigh after the radiotracer. CT data was obtained and used for attenuation correction and anatomic localization.  FASTING BLOOD GLUCOSE:  Value: 93 mg/dl  COMPARISON:  None.  FINDINGS: NECK  No hypermetabolic lymph nodes in the neck.  CHEST   Central left lung mass is identified. This measure approximately 5 x 4 x 4 cm. The SUV max associated with this mass is equal to 13.9 cm. The mass obstructs the left upper lobe airway and there is postobstructive atelectasis of the left upper lobe. A small left pleural effusion is identified. Evidence of pleural spread of tumor is identified. Pleural base nodule within the posterior and medial left lower left hemi thorax has an SUV max equal to 8.3. Pleural nodule mass within the left hemi thorax appears to involve both the visceral and parietal pleura. This measures approximately 2 cm and has an SUV max equal to 15.6. Calcified granulomas identified in both lungs. There are moderate changes of centrilobular emphysema.  Hypermetabolic pre-vascular and sub- carinal lymph node metastasis identified. The sub- carinal lymph node measure 2.2 cm and has an SUV max equal to 13.7.  ABDOMEN/PELVIS  No abnormal hypermetabolic activity within the liver, pancreas, adrenal glands, or  spleen. No hypermetabolic lymph nodes in the abdomen or pelvis. A cyst within the left adnexa measures 5.2 cm  SKELETON  Increased uptake in the area of the anterior aspect of the left fourth rib is identified. This has an SUV max equal to 4.3. Given the absence of distant osseous metastasis this is likely felt to represent volume-averaging within adjacent pleural metastasis.  IMPRESSION: 1. Central, invasive left upper lobe lung mass with associated postobstructive atelectasis is identified and is concerning for primary bronchogenic carcinoma. There is ipsilateral and sub- carinal lymph node metastasis as well as trans pleural spread of tumor within the left hemi thorax. Assuming non-small cell lung cancer this would be considered T3N2M1a or Stage 4 disease.   Electronically Signed   By: Kerby Moors M.D.   On: 11/28/2013 09:30   Dg Chest 2 View  11/15/2013   CLINICAL DATA:  Left upper quadrant pain with chest discomfort. COPD.  EXAM: CHEST  2  VIEW  COMPARISON:  03/29/2012 and 03/17/2006  FINDINGS: Lungs are hypoinflated and demonstrate increased density with somewhat sharp a lobular lateral border over the left hilar region. Couple small stable right perihilar nodules likely calcified granulomas unchanged from 2008. No evidence of pleural effusion. Cardiac silhouette is within normal. There is mild calcified plaque over the thoracic aorta. There are mild degenerative changes of the spine.  IMPRESSION: Moderate prominence of the left hilum with lobulated lateral border suspicious for left hilar mass/adenopathy. Recommend contrast-enhanced chest CT for further evaluation.   Electronically Signed   By: Marin Olp M.D.   On: 11/15/2013 15:20   Ct Chest W Contrast  11/19/2013   CLINICAL DATA:  Left hilar mass on chest radiograph. Left upper quadrant pain.  EXAM: CT CHEST WITH CONTRAST  TECHNIQUE: Multidetector CT imaging of the chest was performed during intravenous contrast administration.  CONTRAST:  27mL OMNIPAQUE IOHEXOL 300 MG/ML  SOLN  COMPARISON:  Chest radiograph on 11/15/2013  FINDINGS: Mediastinum/Hilar Regions: Mediastinal lymphadenopathy is seen in the lateral aortic region measuring 1.6 x 4.3 cm on image 20. Sub- carina lymphadenopathy seen measuring 2.1 cm on image 26.  Other Thoracic Lymphadenopathy:  None.  Lungs: Left upper lobe collapse is seen with a central low-attenuation mass measuring 3.9 x 4.2 cm. This is contiguous with the left hilum obstructs the central left upper lobe bronchus.  In addition, there is an 8 mm indeterminate nodular density in the anterior left lung base on image 38. There are also several subpleural pulmonary nodular densities in the left lower lobe, largest measuring 7 mm on image 44.  Pleura:  Small left pleural effusion noted.  Vascular/Cardiac:  No acute findings identified.  Musculoskeletal:  No suspicious bone lesions identified.  Other:  None.  IMPRESSION: 4 cm central left upper lobe mass which  involves the hilum and causes left upper lobe collapse. This is consistent with primary bronchogenic carcinoma. Consider bronchoscopy for tissue diagnosis.  Mediastinal lymphadenopathy, consistent with metastatic disease.  Small left pleural effusion.  Indeterminate pulmonary nodules in the left lung base, largest measuring 8 mm. PET-CT scan should be considered for further evaluation.   Electronically Signed   By: Earle Gell M.D.   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 I have recommended proceeding with bronchoscopy  EBUS for tissue dx    Assuming the pathology will be available she will have an appointment in the multidisciplinary thoracic oncology clinic on October 29 to see medical and radiation oncology depending on the final pathology and radiographic reports.    The goals risks and alternatives of the planned surgical procedure bronchoscopy with EBUS and Biopsy  have been discussed with the patient in detail. The risks of the procedure including death, infection, stroke, myocardial infarction, bleeding, blood transfusion have all been discussed specifically.  I have quoted Jayelle A Renk a 1% of perioperative mortality and a complication rate as high as 10 %. The patient's questions have been answered.Javier A Laurich is willing  to proceed with the planned procedure.    Grace Isaac MD      [address] [place] [address] Office [phone]   Beeper [phone]  12/03/2013 7:05 AM

## 2013-12-03 NOTE — Anesthesia Preprocedure Evaluation (Addendum)
Anesthesia Evaluation  Patient identified by MRN, date of birth, ID band Patient awake    Reviewed: Allergy & Precautions, H&P , NPO status , Patient's Chart, lab work & pertinent test results, reviewed documented beta blocker date and time   History of Anesthesia Complications Negative for: history of anesthetic complications  Airway Mallampati: II  TM Distance: >3 FB Neck ROM: Full    Dental no notable dental hx. (+) Dental Advisory Given, Missing   Pulmonary COPD COPD inhaler, former smoker,  Lung mass breath sounds clear to auscultation        Cardiovascular hypertension, Pt. on medications and Pt. on home beta blockers + CAD (coronary calcification on CT) Rhythm:Regular Rate:Normal     Neuro/Psych negative neurological ROS  negative psych ROS   GI/Hepatic negative GI ROS, Neg liver ROS, GERD-  Controlled,  Endo/Other  Morbid obesityobesity  Renal/GU negative Renal ROS  negative genitourinary   Musculoskeletal negative musculoskeletal ROS (+)   Abdominal (+) + obese,   Peds negative pediatric ROS (+)  Hematology negative hematology ROS (+)   Anesthesia Other Findings   Reproductive/Obstetrics negative OB ROS                         Anesthesia Physical  Anesthesia Plan  ASA: III  Anesthesia Plan: General   Post-op Pain Management:    Induction: Intravenous  Airway Management Planned: Oral ETT  Additional Equipment:   Intra-op Plan:   Post-operative Plan: Extubation in OR  Informed Consent: I have reviewed the patients History and Physical, chart, labs and discussed the procedure including the risks, benefits and alternatives for the proposed anesthesia with the patient or authorized representative who has indicated his/her understanding and acceptance.   Dental advisory given  Plan Discussed with: CRNA and Surgeon  Anesthesia Plan Comments: (Plan routine monitors, GETA)        Anesthesia Quick Evaluation

## 2013-12-03 NOTE — Progress Notes (Signed)
Dr gerheart here to talk to pt

## 2013-12-03 NOTE — Discharge Instructions (Signed)
Flexible Bronchoscopy, Care After Refer to this sheet in the next few weeks. These instructions provide you with information on caring for yourself after your procedure. Your health care provider may also give you more specific instructions. Your treatment has been planned according to current medical practices, but problems sometimes occur. Call your health care provider if you have any problems or questions after your procedure.  WHAT TO EXPECT AFTER THE PROCEDURE It is normal to have the following symptoms for 24-48 hours after the procedure:   Increased cough.  Low-grade fever.  Sore throat or hoarse voice.  Small streaks of blood in your thick spit (sputum) if tissue samples were taken (biopsy). HOME CARE INSTRUCTIONS   Do not eat or drink anything for 2 hours after your procedure. Your nose and throat were numbed by medicine. If you try to eat or drink before the medicine wears off, food or drink could go into your lungs or you could burn yourself. After the numbness is gone and your cough and gag reflexes have returned, you may eat soft food and drink liquids slowly.   The day after the procedure, you can go back to your normal diet.   You may resume normal activities.   Keep all follow-up visits as directed by your health care provider. It is important to keep all your appointments, especially if tissue samples were taken for testing (biopsy). SEEK IMMEDIATE MEDICAL CARE IF:   You have increasing shortness of breath.   You become light-headed or faint.   You have chest pain.   You have any new concerning symptoms.  You cough up more than a small amount of blood.  The amount of blood you cough up increases. MAKE SURE YOU:  Understand these instructions.  Will watch your condition.  Will get help right away if you are not doing well or get worse. Document Released: 08/13/2004 Document Revised: 06/10/2013 Document Reviewed: 09/28/2012 [facility] Patient Information  2015 [place], [place]. This information is not intended to replace advice given to you by your health care provider. Make sure you discuss any questions you have with your health care provider.   What to eat:  For your first meals, you should eat lightly; only small meals initially.  If you do not have nausea, you may eat larger meals.  Avoid spicy, greasy and heavy food.    General Anesthesia, Adult, Care After  Refer to this sheet in the next few weeks. These instructions provide you with information on caring for yourself after your procedure. Your health care provider may also give you more specific instructions. Your treatment has been planned according to current medical practices, but problems sometimes occur. Call your health care provider if you have any problems or questions after your procedure.  WHAT TO EXPECT AFTER THE PROCEDURE  After the procedure, it is typical to experience:  Sleepiness.  Nausea and vomiting. HOME CARE INSTRUCTIONS  For the first 24 hours after general anesthesia:  Have a responsible person with you.  Do not drive a car. If you are alone, do not take public transportation.  Do not drink alcohol.  Do not take medicine that has not been prescribed by your health care provider.  Do not sign important papers or make important decisions.  You may resume a normal diet and activities as directed by your health care provider.  Change bandages (dressings) as directed.  If you have questions or problems that seem related to general anesthesia, call the hospital and ask  for the anesthetist or anesthesiologist on call. SEEK MEDICAL CARE IF:  You have nausea and vomiting that continue the day after anesthesia.  You develop a rash. SEEK IMMEDIATE MEDICAL CARE IF:  You have difficulty breathing.  You have chest pain.  You have any allergic problems. Document Released: 05/02/2000 Document Revised: 09/26/2012 Document Reviewed: 08/09/2012  [facility] Patient Information  2014 [place], [place].

## 2013-12-04 ENCOUNTER — Encounter (HOSPITAL_COMMUNITY): Payer: Self-pay | Admitting: Cardiothoracic Surgery

## 2013-12-04 NOTE — Op Note (Signed)
[name], Tammie Clay NO.:  [id number]  MEDICAL RECORD NO.:  [id number]  LOCATION:  [facility]                         FACILITY:  [facility]  PHYSICIAN:  Lanelle Bal, MD    DATE OF BIRTH:  [birth year]  DATE OF PROCEDURE:  12/03/2013 DATE OF DISCHARGE:  12/03/2013                              OPERATIVE REPORT   PREOPERATIVE DIAGNOSIS:  Left upper lobe lung mass and mediastinal adenopathy.  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  PROCEDURE PERFORMED:  Bronchoscopy with biopsy and EBUS with biopsy of #7 node.  SURGEON:  Lanelle Bal, MD  BRIEF HISTORY:  The patient is a 68 year old female who presented to her primary care physician with a chest discomfort on yearly exam and a chest x-ray was performed which demonstrated a left upper lobe lung mass.  CT scan, PET scan, and MRI of the brain were completed suggesting with involvement of the left upper lobe, the left pleural space, and mediastinal nodes.  The MRI of the brain was negative.  Bronchoscopy and EBUS were recommended to the patient to obtain a tissue diagnosis. Risks and options were discussed.  The patient agreed and signed informed consent.  DESCRIPTION OF PROCEDURE:  The patient underwent general endotracheal anesthesia without incident.  Appropriate time-out was performed, and we proceeded with a 2.8 mm fiberoptic bronchoscope.  The tracheobronchial tree was examined.  The right side appeared normal.  In the anterior segment, there was an endobronchial mass.  To avoid any bleeding prior to EBUS, the visual scope was removed.  The EBUS scope was placed and with the EBUS scope positioned in the right mainstem bronchus.  A large #7 lymph node was easily identified.  Multiple passes with the transbronchial needle, aspirations were obtained.  The initial smear of this was confirmed to be non-small cell  carcinoma.  The remainder of the tissue was put in cell block for appropriate genetic testing if necessary.  The EBUS scope was then removed and we went back with the visual scope and took biopsies of the left upper lobe endobronchial mass and submitted these for permanent studies.  Blood loss was very minimal. The scopes were removed.  The patient was extubated in the operating room having tolerated the procedure without obvious complication and was transferred to the recovery room for postop care.     Lanelle Bal, MD     EG/MEDQ  D:  12/04/2013  T:  12/04/2013  Job:  [id number]

## 2013-12-05 ENCOUNTER — Encounter: Payer: Self-pay | Admitting: Internal Medicine

## 2013-12-05 ENCOUNTER — Ambulatory Visit (HOSPITAL_BASED_OUTPATIENT_CLINIC_OR_DEPARTMENT_OTHER): Payer: Medicare Other | Admitting: Internal Medicine

## 2013-12-05 ENCOUNTER — Ambulatory Visit: Payer: Medicare Other | Attending: Internal Medicine | Admitting: Physical Therapy

## 2013-12-05 ENCOUNTER — Ambulatory Visit (INDEPENDENT_AMBULATORY_CARE_PROVIDER_SITE_OTHER): Payer: Medicare Other | Admitting: Cardiothoracic Surgery

## 2013-12-05 ENCOUNTER — Ambulatory Visit
Admission: RE | Admit: 2013-12-05 | Discharge: 2013-12-05 | Disposition: A | Discharge status: Home / Self Care | Payer: Medicare Other | Source: Ambulatory Visit | Attending: Radiation Oncology | Admitting: Radiation Oncology

## 2013-12-05 VITALS — BP 145/81 | HR 77 | Temp 97.8°F | Resp 18 | Ht 62.0 in | Wt 173.3 lb

## 2013-12-05 DIAGNOSIS — C3492 Malignant neoplasm of unspecified part of left bronchus or lung: Secondary | ICD-10-CM | POA: Insufficient documentation

## 2013-12-05 DIAGNOSIS — C3490 Malignant neoplasm of unspecified part of unspecified bronchus or lung: Secondary | ICD-10-CM | POA: Insufficient documentation

## 2013-12-05 DIAGNOSIS — M2550 Pain in unspecified joint: Secondary | ICD-10-CM | POA: Diagnosis not present

## 2013-12-05 MED ORDER — DULOXETINE HCL 30 MG PO CPEP: 30.0000 mg | ORAL_CAPSULE | Freq: Every day | ORAL | Status: DC

## 2013-12-05 MED ORDER — PROCHLORPERAZINE MALEATE 10 MG PO TABS: 10.0000 mg | ORAL_TABLET | Freq: Four times a day (QID) | ORAL | Status: DC | PRN

## 2013-12-05 MED ORDER — LIDOCAINE-PRILOCAINE 2.5-2.5 % EX CREA: 1.0000 "application " | TOPICAL_CREAM | CUTANEOUS | Status: AC | PRN

## 2013-12-05 NOTE — Progress Notes (Signed)
[address]       [place] [address]             [phone]                    [facility] Medical Record #[id number] Date of Birth: 08-18-1945  Referring: Mayra Neer, MD Primary Care: Mayra Neer, MD  Chief Complaint:    Lung Mass   History of Present Illness:    Tammie Clay 68 y.o. female is seen in the office for new diagnosis of left upper lobe lung lesion with question of mediastinal involvement. The patient has been a long term smoker, quit one year ago but prior smoked for 55 years. She has no occupational exposure to asbestos.   She had increasing belching and discomfort in her left chest when she lives on her left side, she mentioned this at her yearly checkup. A chest x-ray was obtained suggesting a left hilar mass.   Patient presents today in follow up after bronch ebus done several days ago.   She tolerated procedure well  Current Activity/ Functional Status:  Patient is independent with mobility/ambulation, transfers, ADL's, IADL's.   Zubrod Score: At the time of surgery this patient's most appropriate activity status/level should be described as: []     0    Normal activity, no symptoms [x]     1    Restricted in physical strenuous activity but ambulatory, able to do out light work []     2    Ambulatory and capable of self care, unable to do work activities, up and about               >50 % of waking hours                              []     3    Only limited self care, in bed greater than 50% of waking hours []     4    Completely disabled, no self care, confined to bed or chair []     5    Moribund   Past Medical History  Diagnosis Date  . Hypertension   . COPD (chronic obstructive pulmonary disease)   . Hyperlipidemia   . GERD (gastroesophageal reflux disease)   . Osteopenia   . Insomnia   . Mediastinal lymphadenopathy 11/19/13    PER CT  . Multiple lung nodules on CT 11/19/13    LEFT LUNG BASE  .  Coronary artery calcification seen on CAT scan 11/21/2013  . COPD (chronic obstructive pulmonary disease) 11/21/2013     Past Surgical History  Procedure Laterality Date  .  c section x 2    . Urethral cyst removed    . Tonsillectomy    . Appendectomy    . Colonoscopy with propofol N/A 07/17/2012    Procedure: COLONOSCOPY WITH PROPOFOL;  Surgeon: Garlan Fair, MD;  Location: [facility];  Service: Endoscopy;  Laterality: N/A;  . Esophagogastroduodenoscopy (egd) with propofol N/A 07/17/2012    Procedure: ESOPHAGOGASTRODUODENOSCOPY (EGD) WITH PROPOFOL;  Surgeon: Garlan Fair, MD;  Location: [facility];  Service: Endoscopy;  Laterality: N/A;  . Video bronchoscopy with endobronchial ultrasound N/A 12/03/2013    Procedure: VIDEO BRONCHOSCOPY WITH ENDOBRONCHIAL ULTRASOUND WITH BIOPSIES;  Surgeon: Grace Isaac, MD;  Location: [facility];  Service: Thoracic;  Laterality: N/A;    Family History  Problem Relation Age of Onset  . Diabetes Father   . Heart disease Father     MI  . CVA Mother   . Cancer Cousin     LIPOSARCOMA  . Heart disease Cousin     HEART TRANSPLANTS X 2    History   Social History  . Marital Status: Divorced    Spouse Name: N/A    Number of Children: 3  . Years of Education: N/A   Occupational History  .  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 Smoking status: Former Smoker -- 0.50 packs/day for 50 years    Types: Cigarettes    Quit date: 10/08/2012  . Smokeless tobacco: Never Used     Comment: smokes Vapor daily  . Alcohol Use: No  . Drug Use: No  . Sexual Activity: Not on file   Other Topics Concern  . Not on file   Social History Narrative  . No narrative on file    History  Smoking status  . Former Smoker -- 0.50 packs/day for 50 years  . Types: Cigarettes  . Quit date: 10/08/2012  Smokeless tobacco    . Never Used    Comment: smokes Vapor daily    History  Alcohol Use  . Yes    Comment: social     No Known Allergies  Current Outpatient Prescriptions  Medication Sig Dispense Refill  . albuterol (PROVENTIL HFA;VENTOLIN HFA) 108 (90 BASE) MCG/ACT inhaler Inhale 2 puffs into the lungs every 6 (six) hours as needed for wheezing.      Marland Kitchen aspirin 81 MG tablet Take 81 mg by mouth daily.      Marland Kitchen atorvastatin (LIPITOR) 40 MG tablet Take 40 mg by mouth daily.      . Calcium Carbonate-Vitamin D (CALCIUM-D) 600-400 MG-UNIT TABS Take 1 tablet by mouth 2 (two) times daily.      . diazepam (VALIUM) 5 MG tablet Take 5 mg by mouth at bedtime.      Marland Kitchen HYDROcodone-acetaminophen (NORCO/VICODIN) 5-325 MG per tablet Take 1-2 tablets by mouth every 6 (six) hours as needed for moderate pain.      Marland Kitchen lisinopril-hydrochlorothiazide (PRINZIDE,ZESTORETIC) 20-12.5 MG per tablet Take 2 tablets by mouth every morning.       . metoprolol succinate (TOPROL-XL) 50 MG 24 hr tablet Take 50 mg by mouth every morning. Take with or immediately following a meal.      . Multiple Vitamin (MULTIVITAMIN WITH MINERALS) TABS Take 1 tablet by mouth daily.      . Omega-3 Fatty Acids (OMEGA-3 FISH OIL PO) Take 1 capsule by mouth daily.      Marland Kitchen tiotropium (SPIRIVA) 18 MCG inhalation capsule Place 18 mcg into inhaler and inhale daily.      Marland Kitchen VITAMIN E PO Take 1 capsule by mouth daily.       No current facility-administered medications for this visit.     Review of Systems:     Cardiac Review of Systems: Y or N  Chest Pain Blue.Reese    ]  Resting SOB [ n  ] Exertional SOB  [  y]  Orthopnea Florencio.Farrier  ]   Pedal Edema [  n ]    Palpitations [ n ] Syncope  [ n ]   Presyncope [ n  ]  General Review of Systems: [Y] = yes [  ]=  no Constitional: recent weight change [n ];  Wt loss over the last 3 months [   ] anorexia [  ]; fatigue Blue.Reese  ]; nausea [  n]; night sweats [  ]; fever [ n ]; or chills [ n ];          Dental: poor dentition[  ]; Last Dentist  visit:   Eye : blurred vision [  ]; diplopia [   ]; vision changes [  ];  Amaurosis fugax[  ]; Resp: cough Blue.Reese  ];  wheezing[ n ];  hemoptysis[n  ]; shortness of breath[ y ]; paroxysmal nocturnal dyspnea[ n ]; dyspnea on exertion[  y]; or orthopnea[  ];  GI:  gallstones[ n ], vomiting[ n ];  dysphagia[n  ]; melena[ n ];  hematochezia [  ]; heartburn[  ];   Hx of  Colonoscopy[ y ]; GU: kidney stones [  ]; hematuria[  ];   dysuria [  ];  nocturia[  ];  history of     obstruction [  ]; urinary frequency [  ]             Skin: rash, swelling[  ];, hair loss[  ];  peripheral edema[  ];  or itching[  ]; Musculosketetal: myalgias[  ];  joint swelling[  ];  joint erythema[n  ];  joint pain[  ];  back pain[  ];  Heme/Lymph: bruising[  ];  bleeding[  ];  anemia[  ];  Neuro: TIA[  ];  headaches[  ];  stroke[  ];  vertigo[  ];  seizures[  ];   paresthesias[  ];  difficulty walking[ n ];  Psych:depression[  ]; anxiety[  ];  Endocrine: diabetes[  ];  thyroid dysfunction[  ];  Immunizations: Flu up to date Blue.Reese  ]; Pneumococcal up to date Blue.Reese  ];  Other:  Physical Exam: There were no vitals taken for this visit.  PHYSICAL EXAMINATION:  General appearance: alert and cooperative Neurologic: intact Heart: regular rate and rhythm, S1, S2 normal, no murmur, click, rub or gallop Lungs: diminished breath sounds LUL Abdomen: soft, non-tender; bowel sounds normal; no masses,  no organomegaly Extremities: extremities normal, atraumatic, no cyanosis or edema and Homans sign is negative, no sign of DVT Patient has no cervical or supraclavicular or axillary adenopathy She has no abdominal aneurysm that is palpable PT and DP pulses are palpable bilaterally  Diagnostic Studies & Laboratory data:     Recent Radiology Findings:   Dg Chest 2 View  11/15/2013   CLINICAL DATA:  Left upper quadrant pain with chest discomfort. COPD.  EXAM: CHEST  2 VIEW  COMPARISON:  03/29/2012 and 03/17/2006  FINDINGS: Lungs are  hypoinflated and demonstrate increased density with somewhat sharp a lobular lateral border over the left hilar region. Couple small stable right perihilar nodules likely calcified granulomas unchanged from 2008. No evidence of pleural effusion. Cardiac silhouette is within normal. There is mild calcified plaque over the thoracic aorta. There are mild degenerative changes of the spine.  IMPRESSION: Moderate prominence of the left hilum with lobulated lateral border suspicious for left hilar mass/adenopathy. Recommend contrast-enhanced chest CT for further evaluation.   Electronically Signed   By: Marin Olp M.D.   On: 11/15/2013 15:20   Ct Chest W Contrast  11/19/2013   CLINICAL DATA:  Left hilar mass on chest radiograph. Left upper quadrant pain.  EXAM: CT CHEST WITH CONTRAST  TECHNIQUE: Multidetector CT imaging of the chest was performed during  intravenous contrast administration.  CONTRAST:  58mL OMNIPAQUE IOHEXOL 300 MG/ML  SOLN  COMPARISON:  Chest radiograph on 11/15/2013  FINDINGS: Mediastinum/Hilar Regions: Mediastinal lymphadenopathy is seen in the lateral aortic region measuring 1.6 x 4.3 cm on image 20. Sub- carina lymphadenopathy seen measuring 2.1 cm on image 26.  Other Thoracic Lymphadenopathy:  None.  Lungs: Left upper lobe collapse is seen with a central low-attenuation mass measuring 3.9 x 4.2 cm. This is contiguous with the left hilum obstructs the central left upper lobe bronchus.  In addition, there is an 8 mm indeterminate nodular density in the anterior left lung base on image 38. There are also several subpleural pulmonary nodular densities in the left lower lobe, largest measuring 7 mm on image 44.  Pleura:  Small left pleural effusion noted.  Vascular/Cardiac:  No acute findings identified.  Musculoskeletal:  No suspicious bone lesions identified.  Other:  None.  IMPRESSION: 4 cm central left upper lobe mass which involves the hilum and causes left upper lobe collapse. This is  consistent with primary bronchogenic carcinoma. Consider bronchoscopy for tissue diagnosis.  Mediastinal lymphadenopathy, consistent with metastatic disease.  Small left pleural effusion.  Indeterminate pulmonary nodules in the left lung base, largest measuring 8 mm. PET-CT scan should be considered for further evaluation.   Electronically Signed   By: Earle Gell M.D.   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

## 2013-12-06 ENCOUNTER — Other Ambulatory Visit: Payer: Self-pay | Admitting: *Deleted

## 2013-12-06 ENCOUNTER — Telehealth: Payer: Self-pay | Admitting: Internal Medicine

## 2013-12-06 ENCOUNTER — Encounter: Payer: Self-pay | Admitting: Cardiothoracic Surgery

## 2013-12-06 DIAGNOSIS — R911 Solitary pulmonary nodule: Secondary | ICD-10-CM

## 2013-12-06 NOTE — Telephone Encounter (Signed)
s.w pt husband and advised on NOV appt....ok and aware....sed added tx.

## 2013-12-07 NOTE — Progress Notes (Signed)
[place] [phone]) [phone]   [phone] Thoracic oncology clinic CONSULT NOTE  REFERRING PHYSICIAN: Dr. Martin Majestic  REASON FOR CONSULTATION:  68 years old white female recently diagnosed with lung cancer  HPI Miriya A Gowen is a 68 y.o. female with past medical history significant for COPD, GERD, osteoporosis, dyslipidemia as well as long history of smoking but quit last year. The patient was seen by her primary care physician, Dr. Serita Grammes for routine evaluation. She had chest x-ray performed on 11/15/2013 and it showed moderate prominence of the left hilum was lobulated N border suspicious for left hilar mass/adenopathy. This was followed by CT scan of the chest on 11/19/2013 and it showed 4.0 cm central left upper lobe mass which involved the hilum and closest left upper lobe collapse. This is consistent with primary bronchogenic carcinoma. There was mediastinal lymphadenopathy consistent with metastatic disease and a small left pleural effusion. There was also indeterminate pulmonary nodules in the left lung base the largest measured 0.8 cm. The patient was referred to Dr. Servando Snare and a PET scan was performed on 11/28/2013. It showed central left lung mass which measured approximately 5.0 x 4.0 x 4.0 cm with maximum SUV of 13.9. The mass obstructs the left upper lobe airway and there was postoperative obstructive atelectasis of the left upper lobe. There was also evidence of pleural spread of tumor including the pleural-based nodule within the posterior and medial left lower lobe hemithorax with SUV max of 8.3 and the pleural nodule mass within the left hemithorax appears to involve both the visceral and parietal pleura. It measured 2.0 cm and has SUV max of 15.6. Hypermetabolic pre-vascular and sub- carinal lymph node metastasis identified. The sub- carinal lymph node measure 2.2 cm and has an SUV max equal to 13.7. Increased uptake in the area of the  anterior aspect of the left fourth rib is identified. This has an SUV max equal to 4.3. Given the absence of distant osseous metastasis this is likely felt to represent volume-averaging within adjacent pleural metastasis.  MRI of the brain on 11/27/2013 showed no acute intracranial process identified. On 12/03/2013 the patient underwent a video bronchoscopy with endobronchial ultrasound with biopsies under the care of Dr. Servando Snare. The final pathology (Accession: [id number]) was consistent with invasive squamous cell carcinoma. Dr. Servando Snare kindly referred the patient to me today for further evaluation and recommendation regarding treatment of her metastatic lung cancer. When seen today the patient is very anxious and concerned about her recent diagnosis. She denied having any significant complaints today except for mild headache. She denied having any shortness of breath, cough or hemoptysis. The patient denied having any significant weight loss or night sweats. She has some discomfort on the left side of the chest. She has no nausea or vomiting, no fever or chills. She denied having any visual changes. Family history significant for heart disease in both mother and father. The patient is married and has 3 children. She was accompanied today by her husband, son and daughter. She is currently retired and used to work as a Radiation protection practitioner for [employer]. She has a history of smoking 1 pack per day for around 55 years and quit last year. The patient has no history of alcohol or drug abuse.  HPI  Past Medical History  Diagnosis Date  . Hypertension   . COPD (chronic obstructive pulmonary disease)   . Hyperlipidemia   . GERD (gastroesophageal reflux disease)   . Osteopenia   .  Insomnia   . Mediastinal lymphadenopathy 11/19/13    PER CT  . Multiple lung nodules on CT 11/19/13    LEFT LUNG BASE  . Lung mass 11/19/13    RUL PER CT  . Coronary artery calcification seen on CAT scan  11/21/2013  . COPD (chronic obstructive pulmonary disease) 11/21/2013  . Pneumonia     hx of  . Anxiety     Past Surgical History  Procedure Laterality Date  .  c section x 2    . Urethral cyst removed    . Tonsillectomy    . Appendectomy    . Colonoscopy with propofol N/A 07/17/2012    Procedure: COLONOSCOPY WITH PROPOFOL;  Surgeon: Garlan Fair, MD;  Location: [facility];  Service: Endoscopy;  Laterality: N/A;  . Esophagogastroduodenoscopy (egd) with propofol N/A 07/17/2012    Procedure: ESOPHAGOGASTRODUODENOSCOPY (EGD) WITH PROPOFOL;  Surgeon: Garlan Fair, MD;  Location: [facility];  Service: Endoscopy;  Laterality: N/A;  . Video bronchoscopy with endobronchial ultrasound N/A 12/03/2013    Procedure: VIDEO BRONCHOSCOPY WITH ENDOBRONCHIAL ULTRASOUND WITH BIOPSIES;  Surgeon: Grace Isaac, MD;  Location: [facility] OR;  Service: Thoracic;  Laterality: N/A;    Family History  Problem Relation Age of Onset  . Diabetes Father   . Heart disease Father     MI  . CVA Mother   . Cancer Cousin     LIPOSARCOMA  . Heart disease Cousin     HEART TRANSPLANTS X 2    Social History History  Substance Use Topics  . Smoking status: Former Smoker -- 0.50 packs/day for 50 years    Types: Cigarettes    Quit date: 10/08/2012  . Smokeless tobacco: Never Used     Comment: smokes Vapor daily  . Alcohol Use: Yes     Comment: social    No Known Allergies  Current Outpatient Prescriptions  Medication Sig Dispense Refill  . albuterol (PROVENTIL HFA;VENTOLIN HFA) 108 (90 BASE) MCG/ACT inhaler Inhale 2 puffs into the lungs every 6 (six) hours as needed for wheezing.      Marland Kitchen aspirin 81 MG tablet Take 81 mg by mouth daily.      Marland Kitchen atorvastatin (LIPITOR) 40 MG tablet Take 40 mg by mouth daily.      . Calcium Carbonate-Vitamin D (CALCIUM-D) 600-400 MG-UNIT TABS Take 1 tablet by mouth 2 (two) times daily.      . diazepam (VALIUM) 5 MG tablet Take 5 mg by mouth at bedtime.      Marland Kitchen  HYDROcodone-acetaminophen (NORCO/VICODIN) 5-325 MG per tablet Take 1-2 tablets by mouth every 6 (six) hours as needed for moderate pain.      Marland Kitchen lisinopril-hydrochlorothiazide (PRINZIDE,ZESTORETIC) 20-12.5 MG per tablet Take 2 tablets by mouth every morning.       . metoprolol succinate (TOPROL-XL) 50 MG 24 hr tablet Take 50 mg by mouth every morning. Take with or immediately following a meal.      . Multiple Vitamin (MULTIVITAMIN WITH MINERALS) TABS Take 1 tablet by mouth daily.      . Omega-3 Fatty Acids (OMEGA-3 FISH OIL PO) Take 1 capsule by mouth daily.      Marland Kitchen tiotropium (SPIRIVA) 18 MCG inhalation capsule Place 18 mcg into inhaler and inhale daily.      Marland Kitchen VITAMIN E PO Take 1 capsule by mouth daily.      . DULoxetine (CYMBALTA) 30 MG capsule Take 1 capsule (30 mg total) by mouth daily.  30 capsule  2  .  lidocaine-prilocaine (EMLA) cream Apply 1 application topically as needed.  30 g  0  . prochlorperazine (COMPAZINE) 10 MG tablet Take 1 tablet (10 mg total) by mouth every 6 (six) hours as needed for nausea or vomiting.  30 tablet  0   No current facility-administered medications for this visit.    Review of Systems  Constitutional: negative Eyes: negative Ears, nose, mouth, throat, and face: negative Respiratory: positive for pleurisy/chest pain Cardiovascular: negative Gastrointestinal: negative Genitourinary:negative Integument/breast: negative Hematologic/lymphatic: negative Musculoskeletal:negative Neurological: negative Behavioral/Psych: positive for anxiety Endocrine: negative Allergic/Immunologic: negative  Physical Exam  DXI:PJASN, healthy, no distress, well nourished, well developed and anxious SKIN: skin color, texture, turgor are normal, no rashes or significant lesions HEAD: Normocephalic, No masses, lesions, tenderness or abnormalities EYES: normal, PERRLA EARS: External ears normal, Canals clear OROPHARYNX:no exudate, no erythema and lips, buccal mucosa, and  tongue normal  NECK: supple, no adenopathy, no JVD LYMPH:  no palpable lymphadenopathy, no hepatosplenomegaly BREAST:not examined LUNGS: clear to auscultation , and palpation HEART: regular rate & rhythm, no murmurs and no gallops ABDOMEN:abdomen soft, non-tender, normal bowel sounds and no masses or organomegaly BACK: Back symmetric, no curvature., No CVA tenderness EXTREMITIES:no joint deformities, effusion, or inflammation, no edema, no skin discoloration  NEURO: alert & oriented x 3 with fluent speech, no focal motor/sensory deficits  PERFORMANCE STATUS: ECOG 1  LABORATORY DATA: Lab Results  Component Value Date   WBC 7.7 11/29/2013   HGB 13.2 11/29/2013   HCT 39.8 11/29/2013   MCV 87.7 11/29/2013   PLT 280 11/29/2013      Chemistry      Component Value Date/Time   NA 140 11/29/2013 1330   K 4.4 11/29/2013 1330   CL 100 11/29/2013 1330   CO2 27 11/29/2013 1330   BUN 22 11/29/2013 1330   CREATININE 0.67 11/29/2013 1330   CREATININE 0.80 11/22/2013 1054      Component Value Date/Time   CALCIUM 9.7 11/29/2013 1330   ALKPHOS 74 11/29/2013 1330   AST 18 11/29/2013 1330   ALT 18 11/29/2013 1330   BILITOT 0.4 11/29/2013 1330       RADIOGRAPHIC STUDIES: Dg Chest 2 View Within Previous 72 Hours.  Films Obtained On Friday Are Acceptable For Monday And Tuesday Cases  11/29/2013   CLINICAL DATA:  Left upper lobe mass.  Patient for VATS.  EXAM: CHEST  2 VIEW  COMPARISON:  CT chest 11/28/2013.  PA and lateral chest 11/15/2013.  FINDINGS: Opacity projected in the left hilum correlates with left upper lobe mass seen on the prior studies. Calcified granuloma in the right upper lobe is noted. The lungs are otherwise clear. Heart size is normal. No pneumothorax or pleural effusion. No focal bony abnormality.  IMPRESSION: No acute finding in patient with a left upper lobe mass.   Electronically Signed   By: Inge Rise M.D.   On: 11/29/2013 14:21   Dg Chest 2 View  11/15/2013    CLINICAL DATA:  Left upper quadrant pain with chest discomfort. COPD.  EXAM: CHEST  2 VIEW  COMPARISON:  03/29/2012 and 03/17/2006  FINDINGS: Lungs are hypoinflated and demonstrate increased density with somewhat sharp a lobular lateral border over the left hilar region. Couple small stable right perihilar nodules likely calcified granulomas unchanged from 2008. No evidence of pleural effusion. Cardiac silhouette is within normal. There is mild calcified plaque over the thoracic aorta. There are mild degenerative changes of the spine.  IMPRESSION: Moderate prominence of the left hilum with  lobulated lateral border suspicious for left hilar mass/adenopathy. Recommend contrast-enhanced chest CT for further evaluation.   Electronically Signed   By: Marin Olp M.D.   On: 11/15/2013 15:20   Ct Chest W Contrast  11/19/2013   CLINICAL DATA:  Left hilar mass on chest radiograph. Left upper quadrant pain.  EXAM: CT CHEST WITH CONTRAST  TECHNIQUE: Multidetector CT imaging of the chest was performed during intravenous contrast administration.  CONTRAST:  53mL OMNIPAQUE IOHEXOL 300 MG/ML  SOLN  COMPARISON:  Chest radiograph on 11/15/2013  FINDINGS: Mediastinum/Hilar Regions: Mediastinal lymphadenopathy is seen in the lateral aortic region measuring 1.6 x 4.3 cm on image 20. Sub- carina lymphadenopathy seen measuring 2.1 cm on image 26.  Other Thoracic Lymphadenopathy:  None.  Lungs: Left upper lobe collapse is seen with a central low-attenuation mass measuring 3.9 x 4.2 cm. This is contiguous with the left hilum obstructs the central left upper lobe bronchus.  In addition, there is an 8 mm indeterminate nodular density in the anterior left lung base on image 38. There are also several subpleural pulmonary nodular densities in the left lower lobe, largest measuring 7 mm on image 44.  Pleura:  Small left pleural effusion noted.  Vascular/Cardiac:  No acute findings identified.  Musculoskeletal:  No suspicious bone lesions  identified.  Other:  None.  IMPRESSION: 4 cm central left upper lobe mass which involves the hilum and causes left upper lobe collapse. This is consistent with primary bronchogenic carcinoma. Consider bronchoscopy for tissue diagnosis.  Mediastinal lymphadenopathy, consistent with metastatic disease.  Small left pleural effusion.  Indeterminate pulmonary nodules in the left lung base, largest measuring 8 mm. PET-CT scan should be considered for further evaluation.   Electronically Signed   By: Earle Gell M.D.   On: 11/19/2013 11:41   Mr Jeri Cos QQ Contrast  11/30/2013   CLINICAL DATA:  History of left upper lobe lung lesion and COPD.  EXAM: MRI HEAD WITHOUT AND WITH CONTRAST  TECHNIQUE: Multiplanar, multiecho pulse sequences of the brain and surrounding structures were obtained without and with intravenous contrast.  CONTRAST:  76mL MULTIHANCE GADOBENATE DIMEGLUMINE 529 MG/ML IV SOLN  COMPARISON:  None.  FINDINGS: Mild diffuse prominence of the CSF containing spaces compatible with generalized atrophy. Scattered and confluent T2/FLAIR hyperintensity within the periventricular and deep white matter are noted, nonspecific, but likely related to mild chronic small vessel ischemic changes.  No focal parenchymal signal abnormality is identified. No mass lesion, midline shift, or extra-axial fluid collection. Ventricles are normal in size without evidence of hydrocephalus. No abnormal enhancement.  No diffusion-weighted signal abnormality is identified to suggest acute intracranial infarct. Gray-white matter differentiation is maintained. Normal flow voids are seen within the intracranial vasculature. No intracranial hemorrhage identified.  The cervicomedullary junction is normal. Pituitary gland is within normal limits. Pituitary stalk is midline. The globes and optic nerves demonstrate a normal appearance with normal signal intensity. The  The bone marrow signal intensity is normal. Calvarium is intact. Visualized  upper cervical spine is within normal limits.  Scalp soft tissues are unremarkable.  Paranasal sinuses are clear.  No mastoid effusion.  IMPRESSION: 1. No acute intracranial process identified. Specifically, no mass lesion or evidence of intracranial metastasis. 2. Mild age-related atrophy with chronic small vessel ischemic disease.   Electronically Signed   By: Jeannine Boga M.D.   On: 11/30/2013 06:37   Nm Pet Image Initial (pi) Skull Base To Thigh  11/28/2013   CLINICAL DATA:  Initial treatment strategy  for Lung cancer.  EXAM: NUCLEAR MEDICINE PET SKULL BASE TO THIGH  TECHNIQUE: 9.3 mCi F-18 FDG was injected intravenously. Full-ring PET imaging was performed from the skull base to thigh after the radiotracer. CT data was obtained and used for attenuation correction and anatomic localization.  FASTING BLOOD GLUCOSE:  Value: 93 mg/dl  COMPARISON:  None.  FINDINGS: NECK  No hypermetabolic lymph nodes in the neck.  CHEST  Central left lung mass is identified. This measure approximately 5 x 4 x 4 cm. The SUV max associated with this mass is equal to 13.9 cm. The mass obstructs the left upper lobe airway and there is postobstructive atelectasis of the left upper lobe. A small left pleural effusion is identified. Evidence of pleural spread of tumor is identified. Pleural base nodule within the posterior and medial left lower left hemi thorax has an SUV max equal to 8.3. Pleural nodule mass within the left hemi thorax appears to involve both the visceral and parietal pleura. This measures approximately 2 cm and has an SUV max equal to 15.6. Calcified granulomas identified in both lungs. There are moderate changes of centrilobular emphysema.  Hypermetabolic pre-vascular and sub- carinal lymph node metastasis identified. The sub- carinal lymph node measure 2.2 cm and has an SUV max equal to 13.7.  ABDOMEN/PELVIS  No abnormal hypermetabolic activity within the liver, pancreas, adrenal glands, or spleen. No  hypermetabolic lymph nodes in the abdomen or pelvis. A cyst within the left adnexa measures 5.2 cm  SKELETON  Increased uptake in the area of the anterior aspect of the left fourth rib is identified. This has an SUV max equal to 4.3. Given the absence of distant osseous metastasis this is likely felt to represent volume-averaging within adjacent pleural metastasis.  IMPRESSION: 1. Central, invasive left upper lobe lung mass with associated postobstructive atelectasis is identified and is concerning for primary bronchogenic carcinoma. There is ipsilateral and sub- carinal lymph node metastasis as well as trans pleural spread of tumor within the left hemi thorax. Assuming non-small cell lung cancer this would be considered T3N2M1a or Stage 4 disease.   Electronically Signed   By: Kerby Moors M.D.   On: 11/28/2013 09:30    ASSESSMENT: This is a very pleasant 45 his old white female recently diagnosed with stage IV (T2, N2, M1a) non-small cell lung cancer, squamous cell carcinoma presented with central left upper lobe lung mass with associated postobstructive atelectasis as well as ipsilateral and subcarinal lymphadenopathy as well as pleural tumor spread diagnosed in October 2015.   PLAN: I had a lengthy discussion with the patient and her family today about her current disease stage, prognosis and treatment options. I explained to the patient and her family that she had an incurable condition and also treatment will be off palliative nature. I discussed with the patient the treatment including palliative care and hospice referral versus consideration of palliative systemic chemotherapy. She is interested in treatment. I gave her several options for treatment of her condition including carboplatin for AUC of 5 on day 1 and Abraxane 100 MG/M2 on days 1, 8 and 15 every 3 weeks versus systemic chemotherapy with carboplatin for AUC of 5 and paclitaxel 175 MG/M2 every C weeks versus treatment with carboplatin for  AUC of 5 on day 1 and gemcitabine 1000 MG/M2 on days 1 and 8 every 3 weeks. I discussed with the patient's adverse effect of each one of these regimen including but not limited to alopecia, myelosuppression, nausea and vomiting, peripheral  neuropathy, liver or renal dysfunction. The patient would like to proceed with the treatment that give her the best chance for response and survival. I recommended for her treatment with carboplatin and Abraxane. She would like to start the treatment as soon as possible. I will arrange for the patient to have a chemotherapy education class for starting the first cycle of her chemotherapy next week. For the patient and peripheral neuropathy I will start the patient on Cymbalta 30 mg by mouth daily. I may consider increasing the dose to 60 mg by mouth daily in the near future if she developed any neuropathy. I will also call her pharmacy was prescription for Compazine 10 mg by mouth every 6 hours as needed for nausea and Emla Cream to be applied to the Port-A-Cath site before treatment. I will also refer the patient to Dr. Servando Snare for consideration of Port-A-Cath placement and he kindly will arrange for the patient to have the Port-A-Cath placed before her first treatment next week. The patient will come back for follow-up visit in 2 weeks for reevaluation and management of any adverse effect of her treatment. The patient was seen during the multidisciplinary thoracic oncology clinic today by medical oncology, thoracic surgeon, thoracic navigator, social worker and physical therapist. She was advised to call immediately if she has any concerning symptoms in the interval. I reviewed with the patient and her family that time to ask questions and answered them completely to their satisfaction. The patient voices understanding of current disease status and treatment options and is in agreement with the current care plan.  All questions were answered. The patient knows to  call the clinic with any problems, questions or concerns. We can certainly see the patient much sooner if necessary.  Thank you so much for allowing me to participate in the care of Pax. I will continue to follow up the patient with you and assist in her care.  I spent 55 minutes counseling the patient face to face. The total time spent in the appointment was 80 minutes.  Disclaimer: This note was dictated with voice recognition software. Similar sounding words can inadvertently be transcribed and may not be corrected upon review.   Kayven Aldaco K. 12/07/2013, 6:02 PM

## 2013-12-09 ENCOUNTER — Encounter (HOSPITAL_COMMUNITY): Payer: Self-pay | Admitting: *Deleted

## 2013-12-09 MED ORDER — CEFUROXIME SODIUM 1.5 G IJ SOLR
1.5000 g | INTRAMUSCULAR | Status: AC
  Administered 2013-12-10: 1.5 g via INTRAVENOUS
  Filled 2013-12-09: qty 1.5

## 2013-12-10 ENCOUNTER — Ambulatory Visit (HOSPITAL_COMMUNITY)
Admission: RE | Admit: 2013-12-10 | Discharge: 2013-12-10 | Disposition: A | Discharge status: Home / Self Care | Payer: Medicare Other | Source: Ambulatory Visit | Attending: Cardiothoracic Surgery | Admitting: Cardiothoracic Surgery

## 2013-12-10 ENCOUNTER — Ambulatory Visit (HOSPITAL_COMMUNITY): Payer: Medicare Other

## 2013-12-10 ENCOUNTER — Telehealth: Payer: Self-pay | Admitting: Internal Medicine

## 2013-12-10 ENCOUNTER — Encounter (HOSPITAL_COMMUNITY): Payer: Self-pay | Admitting: Certified Registered Nurse Anesthetist

## 2013-12-10 ENCOUNTER — Ambulatory Visit (HOSPITAL_COMMUNITY): Payer: Medicare Other | Admitting: Certified Registered Nurse Anesthetist

## 2013-12-10 ENCOUNTER — Encounter (HOSPITAL_COMMUNITY)
Admission: RE | Disposition: A | Discharge status: Home / Self Care | Payer: Self-pay | Source: Ambulatory Visit | Attending: Cardiothoracic Surgery

## 2013-12-10 DIAGNOSIS — J449 Chronic obstructive pulmonary disease, unspecified: Secondary | ICD-10-CM | POA: Insufficient documentation

## 2013-12-10 DIAGNOSIS — Z95828 Presence of other vascular implants and grafts: Secondary | ICD-10-CM

## 2013-12-10 DIAGNOSIS — K219 Gastro-esophageal reflux disease without esophagitis: Secondary | ICD-10-CM | POA: Insufficient documentation

## 2013-12-10 DIAGNOSIS — C3492 Malignant neoplasm of unspecified part of left bronchus or lung: Secondary | ICD-10-CM

## 2013-12-10 DIAGNOSIS — E785 Hyperlipidemia, unspecified: Secondary | ICD-10-CM | POA: Diagnosis not present

## 2013-12-10 DIAGNOSIS — Z833 Family history of diabetes mellitus: Secondary | ICD-10-CM | POA: Insufficient documentation

## 2013-12-10 DIAGNOSIS — J9819 Other pulmonary collapse: Secondary | ICD-10-CM | POA: Insufficient documentation

## 2013-12-10 DIAGNOSIS — M8580 Other specified disorders of bone density and structure, unspecified site: Secondary | ICD-10-CM | POA: Diagnosis not present

## 2013-12-10 DIAGNOSIS — R079 Chest pain, unspecified: Secondary | ICD-10-CM | POA: Diagnosis not present

## 2013-12-10 DIAGNOSIS — R590 Localized enlarged lymph nodes: Secondary | ICD-10-CM | POA: Insufficient documentation

## 2013-12-10 DIAGNOSIS — G4700 Insomnia, unspecified: Secondary | ICD-10-CM | POA: Insufficient documentation

## 2013-12-10 DIAGNOSIS — Z8489 Family history of other specified conditions: Secondary | ICD-10-CM | POA: Diagnosis not present

## 2013-12-10 DIAGNOSIS — R911 Solitary pulmonary nodule: Secondary | ICD-10-CM

## 2013-12-10 DIAGNOSIS — C3412 Malignant neoplasm of upper lobe, left bronchus or lung: Secondary | ICD-10-CM | POA: Insufficient documentation

## 2013-12-10 DIAGNOSIS — I10 Essential (primary) hypertension: Secondary | ICD-10-CM | POA: Diagnosis not present

## 2013-12-10 DIAGNOSIS — Z809 Family history of malignant neoplasm, unspecified: Secondary | ICD-10-CM | POA: Diagnosis not present

## 2013-12-10 DIAGNOSIS — F17210 Nicotine dependence, cigarettes, uncomplicated: Secondary | ICD-10-CM | POA: Diagnosis not present

## 2013-12-10 DIAGNOSIS — Z823 Family history of stroke: Secondary | ICD-10-CM | POA: Diagnosis not present

## 2013-12-10 DIAGNOSIS — Z419 Encounter for procedure for purposes other than remedying health state, unspecified: Secondary | ICD-10-CM

## 2013-12-10 DIAGNOSIS — I2510 Atherosclerotic heart disease of native coronary artery without angina pectoris: Secondary | ICD-10-CM | POA: Insufficient documentation

## 2013-12-10 HISTORY — PX: PORTACATH PLACEMENT: SHX2246

## 2013-12-10 LAB — CBC
HCT: 39.8 % (ref 36.0–46.0)
Hemoglobin: 13.6 g/dL (ref 12.0–15.0)
MCH: 30 pg (ref 26.0–34.0)
MCHC: 34.2 g/dL (ref 30.0–36.0)
MCV: 87.9 fL (ref 78.0–100.0)
Platelets: 285 10*3/uL (ref 150–400)
RBC: 4.53 MIL/uL (ref 3.87–5.11)
RDW: 13.9 % (ref 11.5–15.5)
WBC: 9.3 10*3/uL (ref 4.0–10.5)

## 2013-12-10 LAB — COMPREHENSIVE METABOLIC PANEL
ALT: 16 U/L (ref 0–35)
AST: 16 U/L (ref 0–37)
Albumin: 3.6 g/dL (ref 3.5–5.2)
Alkaline Phosphatase: 77 U/L (ref 39–117)
Anion gap: 11 (ref 5–15)
BUN: 22 mg/dL (ref 6–23)
CO2: 28 mEq/L (ref 19–32)
Calcium: 9.8 mg/dL (ref 8.4–10.5)
Chloride: 99 mEq/L (ref 96–112)
Creatinine, Ser: 0.75 mg/dL (ref 0.50–1.10)
GFR calc Af Amer: >90 mL/min (ref >90)
GFR calc non Af Amer: 84 mL/min — ABNORMAL LOW (ref >90)
Glucose, Bld: 101 mg/dL — ABNORMAL HIGH (ref 70–99)
Potassium: 4.7 mEq/L (ref 3.7–5.3)
Sodium: 138 mEq/L (ref 137–147)
Total Bilirubin: 0.4 mg/dL (ref 0.3–1.2)
Total Protein: 7 g/dL (ref 6.0–8.3)

## 2013-12-10 LAB — PROTIME-INR
INR: 1.13 (ref 0.00–1.49)
Prothrombin Time: 14.6 seconds (ref 11.6–15.2)

## 2013-12-10 LAB — APTT: aPTT: 31 seconds (ref 24–37)

## 2013-12-10 SURGERY — INSERTION, TUNNELED CENTRAL VENOUS DEVICE, WITH PORT
Anesthesia: Monitor Anesthesia Care | Laterality: Left

## 2013-12-10 MED ORDER — FENTANYL CITRATE 0.05 MG/ML IJ SOLN
INTRAMUSCULAR | Status: DC | PRN
  Administered 2013-12-10: 50 ug via INTRAVENOUS

## 2013-12-10 MED ORDER — LIDOCAINE HCL 1 % IJ SOLN
INTRAMUSCULAR | Status: DC | PRN
  Administered 2013-12-10: 60 mg via INTRADERMAL

## 2013-12-10 MED ORDER — NEOSTIGMINE METHYLSULFATE 10 MG/10ML IV SOLN
INTRAVENOUS | Status: AC
  Filled 2013-12-10: qty 1

## 2013-12-10 MED ORDER — HEPARIN SODIUM (PORCINE) 1000 UNIT/ML IJ SOLN
INTRAMUSCULAR | Status: AC
  Filled 2013-12-10: qty 1

## 2013-12-10 MED ORDER — ONDANSETRON HCL 4 MG/2ML IJ SOLN
INTRAMUSCULAR | Status: AC
  Filled 2013-12-10: qty 2

## 2013-12-10 MED ORDER — FENTANYL CITRATE 0.05 MG/ML IJ SOLN
INTRAMUSCULAR | Status: AC
  Filled 2013-12-10: qty 5

## 2013-12-10 MED ORDER — HYDROMORPHONE HCL 1 MG/ML IJ SOLN
0.2500 mg | INTRAMUSCULAR | Status: DC | PRN
  Administered 2013-12-10 (×2): 0.5 mg via INTRAVENOUS

## 2013-12-10 MED ORDER — LACTATED RINGERS IV SOLN
INTRAVENOUS | Status: DC | PRN
  Administered 2013-12-10: 07:00:00 via INTRAVENOUS

## 2013-12-10 MED ORDER — EPHEDRINE SULFATE 50 MG/ML IJ SOLN
INTRAMUSCULAR | Status: AC
  Filled 2013-12-10: qty 1

## 2013-12-10 MED ORDER — SODIUM CHLORIDE 0.9 % IR SOLN
Status: DC | PRN
  Administered 2013-12-10: 08:00:00

## 2013-12-10 MED ORDER — PROPOFOL INFUSION 10 MG/ML OPTIME
INTRAVENOUS | Status: DC | PRN
  Administered 2013-12-10: 200 ug/kg/min via INTRAVENOUS

## 2013-12-10 MED ORDER — PROPOFOL 10 MG/ML IV BOLUS
INTRAVENOUS | Status: AC
  Filled 2013-12-10: qty 20

## 2013-12-10 MED ORDER — OXYCODONE HCL 5 MG/5ML PO SOLN: 5.0000 mg | Freq: Once | ORAL | Status: DC | PRN

## 2013-12-10 MED ORDER — HEPARIN SOD (PORK) LOCK FLUSH 100 UNIT/ML IV SOLN
INTRAVENOUS | Status: DC | PRN
  Administered 2013-12-10: 500 [IU] via INTRAVENOUS

## 2013-12-10 MED ORDER — HYDROMORPHONE HCL 1 MG/ML IJ SOLN
INTRAMUSCULAR | Status: AC
  Filled 2013-12-10: qty 1

## 2013-12-10 MED ORDER — LIDOCAINE HCL (PF) 1 % IJ SOLN
INTRAMUSCULAR | Status: DC | PRN
  Administered 2013-12-10: 30 mL

## 2013-12-10 MED ORDER — PROMETHAZINE HCL 25 MG/ML IJ SOLN: 6.2500 mg | INTRAMUSCULAR | Status: DC | PRN

## 2013-12-10 MED ORDER — MIDAZOLAM HCL 5 MG/5ML IJ SOLN
INTRAMUSCULAR | Status: DC | PRN
  Administered 2013-12-10: 2 mg via INTRAVENOUS

## 2013-12-10 MED ORDER — LIDOCAINE HCL (PF) 1 % IJ SOLN
INTRAMUSCULAR | Status: AC
  Filled 2013-12-10: qty 30

## 2013-12-10 MED ORDER — HEPARIN SOD (PORK) LOCK FLUSH 100 UNIT/ML IV SOLN
INTRAVENOUS | Status: AC
  Filled 2013-12-10: qty 5

## 2013-12-10 MED ORDER — ARTIFICIAL TEARS OP OINT
TOPICAL_OINTMENT | OPHTHALMIC | Status: AC
  Filled 2013-12-10: qty 3.5

## 2013-12-10 MED ORDER — ROCURONIUM BROMIDE 50 MG/5ML IV SOLN
INTRAVENOUS | Status: AC
  Filled 2013-12-10: qty 1

## 2013-12-10 MED ORDER — MIDAZOLAM HCL 2 MG/2ML IJ SOLN
INTRAMUSCULAR | Status: AC
  Filled 2013-12-10: qty 2

## 2013-12-10 MED ORDER — GLYCOPYRROLATE 0.2 MG/ML IJ SOLN
INTRAMUSCULAR | Status: AC
  Filled 2013-12-10: qty 3

## 2013-12-10 MED ORDER — OXYCODONE HCL 5 MG PO TABS: 5.0000 mg | ORAL_TABLET | Freq: Once | ORAL | Status: DC | PRN

## 2013-12-10 MED ORDER — LIDOCAINE HCL (CARDIAC) 20 MG/ML IV SOLN
INTRAVENOUS | Status: AC
  Filled 2013-12-10: qty 5

## 2013-12-10 MED ORDER — SODIUM CHLORIDE 0.9 % IJ SOLN
INTRAMUSCULAR | Status: AC
  Filled 2013-12-10: qty 10

## 2013-12-10 MED ORDER — SODIUM CHLORIDE 0.9 % IR SOLN
Status: DC | PRN
  Administered 2013-12-10: 1000 mL

## 2013-12-10 SURGICAL SUPPLY — 42 items
ADH SKN CLS APL DERMABOND .7 (GAUZE/BANDAGES/DRESSINGS) ×1
BAG DECANTER FOR FLEXI CONT (MISCELLANEOUS) ×2 IMPLANT
BLADE SURG 11 STRL SS (BLADE) ×2 IMPLANT
CANISTER SUCTION 2500CC (MISCELLANEOUS) ×2 IMPLANT
COVER PROBE W GEL 5X96 (DRAPES) ×2 IMPLANT
COVER SURGICAL LIGHT HANDLE (MISCELLANEOUS) ×2 IMPLANT
DERMABOND ADVANCED (GAUZE/BANDAGES/DRESSINGS) ×1
DERMABOND ADVANCED .7 DNX12 (GAUZE/BANDAGES/DRESSINGS) ×1 IMPLANT
DRAPE C-ARM 42X72 X-RAY (DRAPES) ×2 IMPLANT
DRAPE CHEST BREAST 15X10 FENES (DRAPES) ×2 IMPLANT
ELECT CAUTERY BLADE 6.4 (BLADE) ×2 IMPLANT
ELECT REM PT RETURN 9FT ADLT (ELECTROSURGICAL) ×2
ELECTRODE REM PT RTRN 9FT ADLT (ELECTROSURGICAL) ×1 IMPLANT
GAUZE SPONGE 4X4 12PLY STRL (GAUZE/BANDAGES/DRESSINGS) ×2 IMPLANT
GLOVE BIO SURGEON STRL SZ 6.5 (GLOVE) ×4 IMPLANT
GOWN STRL REUS W/ TWL LRG LVL3 (GOWN DISPOSABLE) ×2 IMPLANT
GOWN STRL REUS W/TWL LRG LVL3 (GOWN DISPOSABLE) ×4
GUIDEWIRE UNCOATED ST S 7038 (WIRE) IMPLANT
INTRODUCER 13FR (MISCELLANEOUS) IMPLANT
INTRODUCER COOK 11FR (CATHETERS) IMPLANT
KIT BASIN OR (CUSTOM PROCEDURE TRAY) ×2 IMPLANT
KIT PORT POWER 9.6FR MRI PREA (Catheter) ×1 IMPLANT
KIT PORT POWER ISP 8FR (Catheter) IMPLANT
KIT POWER CATH 8FR (Catheter) IMPLANT
KIT ROOM TURNOVER OR (KITS) ×2 IMPLANT
NDL HYPO 25GX1X1/2 BEV (NEEDLE) ×1 IMPLANT
NEEDLE 22X1 1/2 (OR ONLY) (NEEDLE) IMPLANT
NEEDLE HYPO 25GX1X1/2 BEV (NEEDLE) ×2 IMPLANT
NS IRRIG 1000ML POUR BTL (IV SOLUTION) ×2 IMPLANT
PACK GENERAL/GYN (CUSTOM PROCEDURE TRAY) ×2 IMPLANT
PAD ARMBOARD 7.5X6 YLW CONV (MISCELLANEOUS) ×4 IMPLANT
SET SHEATH INTRODUCER 10FR (MISCELLANEOUS) IMPLANT
SUT ETHILON 3 0 FSL (SUTURE) ×1 IMPLANT
SUT SILK 2 0 SH (SUTURE) ×2 IMPLANT
SUT VIC AB 3-0 SH 18 (SUTURE) ×2 IMPLANT
SUT VICRYL 4-0 PS2 18IN ABS (SUTURE) ×2 IMPLANT
SYR 20CC LL (SYRINGE) ×2 IMPLANT
SYR 5ML LUER SLIP (SYRINGE) IMPLANT
SYR CONTROL 10ML LL (SYRINGE) ×2 IMPLANT
TOWEL OR 17X24 6PK STRL BLUE (TOWEL DISPOSABLE) ×2 IMPLANT
TOWEL OR 17X26 10 PK STRL BLUE (TOWEL DISPOSABLE) ×2 IMPLANT
WATER STERILE IRR 1000ML POUR (IV SOLUTION) ×2 IMPLANT

## 2013-12-10 NOTE — Op Note (Signed)
NAMEPRIYAH, SCHMUCK NO.:  [id number]  MEDICAL RECORD NO.:  [id number]  LOCATION:  [facility]                         FACILITY:  [facility]  PHYSICIAN:  Lanelle Bal, MD    DATE OF BIRTH:  [birth year]  DATE OF PROCEDURE:  12/10/2013 DATE OF DISCHARGE:                              OPERATIVE REPORT   PREOPERATIVE DIAGNOSIS:  Need for vascular access for chemotherapy for treatment of lung cancer.  POSTOPERATIVE DIAGNOSIS:  Need for vascular access for chemotherapy for treatment of lung cancer.  SURGICAL PROCEDURE:  Placement of left subclavian vein Port-A-Cath with vascular ultrasound and fluoro guidance.  SURGEON:  Lanelle Bal, MD.  BRIEF HISTORY:  The patient is a 68 year old female who is recently diagnosed with stage IV non-small cell lung cancer and to start chemotherapy tomorrow.  She is referred by Dr. Inda Merlin for placement of a Port-A-Cath for vascular access for chemotherapy.  Risks and options were discussed with the patient, and she is agreeable and signed informed consent.  DESCRIPTION OF PROCEDURE:  Under IV sedation, MAC anesthesia, appropriate time-out was performed.  The chest, both the left and right subclavian areas were prepped with Betadine and draped in usual sterile manner.  We started with the insertion on the left as the patient is right handed.  A 1% lidocaine, 10 mL total was infiltrated in the left infraclavicular area and in the area for the port site in the anterior chest.  Using SonoSite, the subclavian vein was identified.  A 16-gauge needle was introduced into the left subclavian vein and a guidewire was placed into the superior vena cava.  This was confirmed fluoroscopically.  A wire was passed into the inferior vena cava to confirm position.  It was then pulled back into the superior vena cava. A subcutaneous pocket was then created over the left anterior chest wall and a purple port preattached.  A 9.5-French Port-A-Cath was  selected and put into the subcutaneous pocket and tunneled to the wire insertion site.  Under fluoroscopic guidance, a peel-away sheath was introduced over the guidewire.  Through this, the previously trimmed port was then positioned into the superior vena cava.  The length appeared good. There was no evidence of pneumothorax.  The wounds were irrigated and closed with interrupted 3-0 Vicryl.  Before closure of the port site incision, there was good aspiration of blood into the port.  The port was flushed with heparinized saline and then 2.5 mL of 500 units/mL of heparin was placed into the port.  The incisions were then closed with a running 4-0 subcuticular stitch.  Dermabond was applied.  At the completion, fluoroscopic exam showed no evidence of pneumothorax and good position of the catheter.  The patient was then transferred to the recovery room having tolerated procedure without obvious complication.  Sponge and needle count was correct.  Blood loss was very minimal.     Lanelle Bal, MD     EG/MEDQ  D:  12/10/2013  T:  12/10/2013  Job:  [id number]

## 2013-12-10 NOTE — Anesthesia Postprocedure Evaluation (Signed)
  Anesthesia Post-op Note  Patient: Tammie Clay  Procedure(s) Performed: Procedure(s): INSERTION PORT-A-CATH (Left)  Patient Location: PACU  Anesthesia Type:MAC  Level of Consciousness: awake and alert   Airway and Oxygen Therapy: Patient Spontanous Breathing  Post-op Pain: mild  Post-op Assessment: Post-op Vital signs reviewed  Post-op Vital Signs: stable  Last Vitals:  Filed Vitals:   12/10/13 1037  BP: 135/82  Pulse: 63  Temp:   Resp: 15    Complications: No apparent anesthesia complications

## 2013-12-10 NOTE — Anesthesia Procedure Notes (Signed)
Procedure Name: MAC Date/Time: 12/10/2013 7:30 AM Performed by: Ned Grace Pre-anesthesia Checklist: Patient identified, Timeout performed, Emergency Drugs available, Suction available and Patient being monitored Patient Re-evaluated:Patient Re-evaluated prior to inductionOxygen Delivery Method: Simple face mask Intubation Type: IV induction

## 2013-12-10 NOTE — H&P (Signed)
Tammie Clay       Tammie Clay [id number]             [phone]                    Tammie Clay [place] Medical Record #[id number] Date of Birth: 1945-06-03  Referring:Dr Mayra Neer Primary Care: Mayra Neer, MD  Chief Complaint:    Left lung lesion   History of Present Illness:    Tammie Clay 68 y.o. female  Was seen in the office for new diagnosis of left upper lobe lung lesion with question of mediastinal involvement. The patient has been a long term smoker, quit one year ago but prior smoked for 55 years. She has no occupational exposure to asbestos.   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 Here for placement of porta cath  Current Activity/ Functional Status:  Patient is independent with mobility/ambulation, transfers, ADL's, IADL's.   Zubrod Score: At the time of surgery this patient's most appropriate activity status/level should be described as: []     0    Normal activity, no symptoms [x]     1    Restricted in physical strenuous activity but ambulatory, able to do out light work []     2    Ambulatory and capable of self care, unable to do work activities, up and about               >50 % of waking hours                              []     3    Only limited self care, in bed greater than 50% of waking hours []     4    Completely disabled, no self care, confined to bed or chair []     5    Moribund   Past Medical History  Diagnosis Date  . Hypertension   . COPD (chronic obstructive pulmonary disease)   . Hyperlipidemia   . GERD (gastroesophageal reflux disease)   . Osteopenia   . Insomnia   . Mediastinal lymphadenopathy 11/19/13    PER CT  . Multiple lung nodules on CT 11/19/13    LEFT LUNG BASE  . Coronary artery calcification seen on CAT scan 11/21/2013  . COPD (chronic obstructive pulmonary disease) 11/21/2013     Past Surgical History  Procedure Laterality Date  .  c section x 2    .  Urethral cyst removed    . Tonsillectomy    . Appendectomy    . Colonoscopy with propofol N/A 07/17/2012    Procedure: COLONOSCOPY WITH PROPOFOL;  Surgeon: Garlan Fair, MD;  Location: [facility];  Service: Endoscopy;  Laterality: N/A;  . Esophagogastroduodenoscopy (egd) with propofol N/A 07/17/2012    Procedure: ESOPHAGOGASTRODUODENOSCOPY (EGD) WITH PROPOFOL;  Surgeon: Garlan Fair, MD;  Location: [facility];  Service: Endoscopy;  Laterality: N/A;  . Video bronchoscopy with endobronchial ultrasound N/A 12/03/2013    Procedure: VIDEO BRONCHOSCOPY WITH ENDOBRONCHIAL ULTRASOUND WITH BIOPSIES;  Surgeon: Grace Isaac, MD;  Location: [facility];  Service: Thoracic;  Laterality: N/A;    Family History  Problem Relation Age of Onset  . Diabetes Father   . Heart disease Father     MI  . CVA Mother   . Cancer Cousin     LIPOSARCOMA  .  Heart disease Cousin     HEART TRANSPLANTS X 2    History   Social History  . Marital Status: Divorced    Spouse Name: N/A    Number of Children: 3  . Years of Education: N/A   Occupational History  .  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 Smoking status: Former Smoker -- 0.50 packs/day for 50 years    Types: Cigarettes    Quit date: 10/08/2012  . Smokeless tobacco: Never Used     Comment: smokes Vapor daily  . Alcohol Use: No  . Drug Use: No  . Sexual Activity: Not on file     History  Smoking status  . Former Smoker -- 0.50 packs/day for 50 years  . Types: Cigarettes  . Quit date: 10/08/2012  Smokeless tobacco  . Never Used    Comment: smokes Vapor daily    History  Alcohol Use  . Yes    Comment: social     No Known Allergies  Current Facility-Administered Medications  Medication Dose Route Frequency Provider Last Rate Last Dose  . cefUROXime (ZINACEF) 1.5 g in dextrose 5 % 50 mL IVPB   1.5 g Intravenous 60 min Pre-Op Grace Isaac, MD         Review of Systems:     Cardiac Review of Systems: Y or N  Chest Pain Blue.Reese    ]  Resting SOB [ n  ] Exertional SOB  [  y]  Orthopnea Florencio.Farrier  ]   Pedal Edema [  n ]    Palpitations [ n ] Syncope  [ n ]   Presyncope [ n  ]  General Review of Systems: [Y] = yes [  ]=no Constitional: recent weight change [n ];  Wt loss over the last 3 months [   ] anorexia [  ]; fatigue Blue.Reese  ]; nausea [  n]; night sweats [  ]; fever [ n ]; or chills [ n ];          Dental: poor dentition[  ]; Last Dentist visit:   Eye : blurred vision [  ]; diplopia [   ]; vision changes [  ];  Amaurosis fugax[  ]; Resp: cough Blue.Reese  ];  wheezing[ n ];  hemoptysis[n  ]; shortness of breath[ y ]; paroxysmal nocturnal dyspnea[ n ]; dyspnea on exertion[  y]; or orthopnea[  ];  GI:  gallstones[ n ], vomiting[ n ];  dysphagia[n  ]; melena[ n ];  hematochezia [  ]; heartburn[  ];   Hx of  Colonoscopy[ y ]; GU: kidney stones [  ]; hematuria[  ];   dysuria [  ];  nocturia[  ];  history of     obstruction [  ]; urinary frequency [  ]             Skin: rash, swelling[  ];, hair loss[  ];  peripheral edema[  ];  or itching[  ]; Musculosketetal: myalgias[  ];  joint swelling[  ];  joint erythema[n  ];  joint pain[  ];  back pain[  ];  Heme/Lymph: bruising[  ];  bleeding[  ];  anemia[  ];  Neuro: TIA[  ];  headaches[  ];  stroke[  ];  vertigo[  ];  seizures[  ];   paresthesias[  ];  difficulty walking[ n ];  Psych:depression[  ]; anxiety[  ];  Endocrine: diabetes[  ];  thyroid dysfunction[  ];  Immunizations: Flu up to date Blue.Reese  ]; Pneumococcal up to date Blue.Reese  ];  Other:  Physical Exam: BP 127/79 mmHg  Pulse 64  Temp(Src) 97.8 F (36.6 C) (Oral)  Resp 18  Ht 5\' 2"  (1.575 m)  Wt 173 lb (78.472 kg)  BMI 31.63 kg/m2  SpO2 95%  PHYSICAL EXAMINATION:  General appearance: alert and cooperative Neurologic: intact Heart: regular rate and rhythm, S1, S2 normal, no murmur, click, rub or  gallop Lungs: diminished breath sounds LUL Abdomen: soft, non-tender; bowel sounds normal; no masses,  no organomegaly Extremities: extremities normal, atraumatic, no cyanosis or edema and Homans sign is negative, no sign of DVT Patient has no cervical or supraclavicular or axillary adenopathy She has no abdominal aneurysm that is palpable PT and DP pulses are palpable bilaterally Patient is rt handed   Diagnostic Studies & Laboratory data:     Recent Radiology Findings:  Dg Chest 2 View Within Previous 72 Hours.  Films Obtained On Friday Are Acceptable For Monday And Tuesday Cases  11/29/2013   CLINICAL DATA:  Left upper lobe mass.  Patient for VATS.  EXAM: CHEST  2 VIEW  COMPARISON:  CT chest 11/28/2013.  PA and lateral chest 11/15/2013.  FINDINGS: Opacity projected in the left hilum correlates with left upper lobe mass seen on the prior studies. Calcified granuloma in the right upper lobe is noted. The lungs are otherwise clear. Heart size is normal. No pneumothorax or pleural effusion. No focal bony abnormality.  IMPRESSION: No acute finding in patient with a left upper lobe mass.   Electronically Signed   By: Inge Rise M.D.   On: 11/29/2013 14:21   Dg Chest 2 View  11/15/2013   CLINICAL DATA:  Left upper quadrant pain with chest discomfort. COPD.  EXAM: CHEST  2 VIEW  COMPARISON:  03/29/2012 and 03/17/2006  FINDINGS: Lungs are hypoinflated and demonstrate increased density with somewhat sharp a lobular lateral border over the left hilar region. Couple small stable right perihilar nodules likely calcified granulomas unchanged from 2008. No evidence of pleural effusion. Cardiac silhouette is within normal. There is mild calcified plaque over the thoracic aorta. There are mild degenerative changes of the spine.  IMPRESSION: Moderate prominence of the left hilum with lobulated lateral border suspicious for left hilar mass/adenopathy. Recommend contrast-enhanced chest CT for further  evaluation.   Electronically Signed   By: Marin Olp M.D.   On: 11/15/2013 15:20   Ct Chest W Contrast  11/19/2013   CLINICAL DATA:  Left hilar mass on chest radiograph. Left upper quadrant pain.  EXAM: CT CHEST WITH CONTRAST  TECHNIQUE: Multidetector CT imaging of the chest was performed during intravenous contrast administration.  CONTRAST:  30mL OMNIPAQUE IOHEXOL 300 MG/ML  SOLN  COMPARISON:  Chest radiograph on 11/15/2013  FINDINGS: Mediastinum/Hilar Regions: Mediastinal lymphadenopathy is seen in the lateral aortic region measuring 1.6 x 4.3 cm on image 20. Sub- carina lymphadenopathy seen measuring 2.1 cm on image 26.  Other Thoracic Lymphadenopathy:  None.  Lungs: Left upper lobe collapse is seen with a central low-attenuation mass measuring 3.9 x 4.2 cm. This is contiguous with the left hilum obstructs the central left upper lobe bronchus.  In addition, there is an 8 mm indeterminate nodular density in the anterior left lung base on image 38. There are also several subpleural pulmonary nodular densities in the  left lower lobe, largest measuring 7 mm on image 44.  Pleura:  Small left pleural effusion noted.  Vascular/Cardiac:  No acute findings identified.  Musculoskeletal:  No suspicious bone lesions identified.  Other:  None.  IMPRESSION: 4 cm central left upper lobe mass which involves the hilum and causes left upper lobe collapse. This is consistent with primary bronchogenic carcinoma. Consider bronchoscopy for tissue diagnosis.  Mediastinal lymphadenopathy, consistent with metastatic disease.  Small left pleural effusion.  Indeterminate pulmonary nodules in the left lung base, largest measuring 8 mm. PET-CT scan should be considered for further evaluation.   Electronically Signed   By: Earle Gell M.D.   On: 11/19/2013 11:41   Mr Jeri Cos IO Contrast  11/30/2013   CLINICAL DATA:  History of left upper lobe lung lesion and COPD.  EXAM: MRI HEAD WITHOUT AND WITH CONTRAST  TECHNIQUE: Multiplanar,  multiecho pulse sequences of the brain and surrounding structures were obtained without and with intravenous contrast.  CONTRAST:  18mL MULTIHANCE GADOBENATE DIMEGLUMINE 529 MG/ML IV SOLN  COMPARISON:  None.  FINDINGS: Mild diffuse prominence of the CSF containing spaces compatible with generalized atrophy. Scattered and confluent T2/FLAIR hyperintensity within the periventricular and deep white matter are noted, nonspecific, but likely related to mild chronic small vessel ischemic changes.  No focal parenchymal signal abnormality is identified. No mass lesion, midline shift, or extra-axial fluid collection. Ventricles are normal in size without evidence of hydrocephalus. No abnormal enhancement.  No diffusion-weighted signal abnormality is identified to suggest acute intracranial infarct. Gray-white matter differentiation is maintained. Normal flow voids are seen within the intracranial vasculature. No intracranial hemorrhage identified.  The cervicomedullary junction is normal. Pituitary gland is within normal limits. Pituitary stalk is midline. The globes and optic nerves demonstrate a normal appearance with normal signal intensity. The  The bone marrow signal intensity is normal. Calvarium is intact. Visualized upper cervical spine is within normal limits.  Scalp soft tissues are unremarkable.  Paranasal sinuses are clear.  No mastoid effusion.  IMPRESSION: 1. No acute intracranial process identified. Specifically, no mass lesion or evidence of intracranial metastasis. 2. Mild age-related atrophy with chronic small vessel ischemic disease.   Electronically Signed   By: Jeannine Boga M.D.   On: 11/30/2013 06:37   Nm Pet Image Initial (pi) Skull Base To Thigh  11/28/2013   CLINICAL DATA:  Initial treatment strategy for Lung cancer.  EXAM: NUCLEAR MEDICINE PET SKULL BASE TO THIGH  TECHNIQUE: 9.3 mCi F-18 FDG was injected intravenously. Full-ring PET imaging was performed from the skull base to thigh  after the radiotracer. CT data was obtained and used for attenuation correction and anatomic localization.  FASTING BLOOD GLUCOSE:  Value: 93 mg/dl  COMPARISON:  None.  FINDINGS: NECK  No hypermetabolic lymph nodes in the neck.  CHEST  Central left lung mass is identified. This measure approximately 5 x 4 x 4 cm. The SUV max associated with this mass is equal to 13.9 cm. The mass obstructs the left upper lobe airway and there is postobstructive atelectasis of the left upper lobe. A small left pleural effusion is identified. Evidence of pleural spread of tumor is identified. Pleural base nodule within the posterior and medial left lower left hemi thorax has an SUV max equal to 8.3. Pleural nodule mass within the left hemi thorax appears to involve both the visceral and parietal pleura. This measures approximately 2 cm and has an SUV max equal to 15.6. Calcified granulomas identified in both lungs. There  are moderate changes of centrilobular emphysema.  Hypermetabolic pre-vascular and sub- carinal lymph node metastasis identified. The sub- carinal lymph node measure 2.2 cm and has an SUV max equal to 13.7.  ABDOMEN/PELVIS  No abnormal hypermetabolic activity within the liver, pancreas, adrenal glands, or spleen. No hypermetabolic lymph nodes in the abdomen or pelvis. A cyst within the left adnexa measures 5.2 cm  SKELETON  Increased uptake in the area of the anterior aspect of the left fourth rib is identified. This has an SUV max equal to 4.3. Given the absence of distant osseous metastasis this is likely felt to represent volume-averaging within adjacent pleural metastasis.  IMPRESSION: 1. Central, invasive left upper lobe lung mass with associated postobstructive atelectasis is identified and is concerning for primary bronchogenic carcinoma. There is ipsilateral and sub- carinal lymph node metastasis as well as trans pleural spread of tumor within the left hemi thorax. Assuming non-small cell lung cancer this would  be considered T3N2M1a or Stage 4 disease.   Electronically Signed   By: Kerby Moors M.D.   On: 11/28/2013 09:30   Dg Chest 2 View  11/15/2013   CLINICAL DATA:  Left upper quadrant pain with chest discomfort. COPD.  EXAM: CHEST  2 VIEW  COMPARISON:  03/29/2012 and 03/17/2006  FINDINGS: Lungs are hypoinflated and demonstrate increased density with somewhat sharp a lobular lateral border over the left hilar region. Couple small stable right perihilar nodules likely calcified granulomas unchanged from 2008. No evidence of pleural effusion. Cardiac silhouette is within normal. There is mild calcified plaque over the thoracic aorta. There are mild degenerative changes of the spine.  IMPRESSION: Moderate prominence of the left hilum with lobulated lateral border suspicious for left hilar mass/adenopathy. Recommend contrast-enhanced chest CT for further evaluation.   Electronically Signed   By: Marin Olp M.D.   On: 11/15/2013 15:20   Ct Chest W Contrast  11/19/2013   CLINICAL DATA:  Left hilar mass on chest radiograph. Left upper quadrant pain.  EXAM: CT CHEST WITH CONTRAST  TECHNIQUE: Multidetector CT imaging of the chest was performed during intravenous contrast administration.  CONTRAST:  31mL OMNIPAQUE IOHEXOL 300 MG/ML  SOLN  COMPARISON:  Chest radiograph on 11/15/2013  FINDINGS: Mediastinum/Hilar Regions: Mediastinal lymphadenopathy is seen in the lateral aortic region measuring 1.6 x 4.3 cm on image 20. Sub- carina lymphadenopathy seen measuring 2.1 cm on image 26.  Other Thoracic Lymphadenopathy:  None.  Lungs: Left upper lobe collapse is seen with a central low-attenuation mass measuring 3.9 x 4.2 cm. This is contiguous with the left hilum obstructs the central left upper lobe bronchus.  In addition, there is an 8 mm indeterminate nodular density in the anterior left lung base on image 38. There are also several subpleural pulmonary nodular densities in the left lower lobe, largest measuring 7 mm on  image 44.  Pleura:  Small left pleural effusion noted.  Vascular/Cardiac:  No acute findings identified.  Musculoskeletal:  No suspicious bone lesions identified.  Other:  None.  IMPRESSION: 4 cm central left upper lobe mass which involves the hilum and causes left upper lobe collapse. This is consistent with primary bronchogenic carcinoma. Consider bronchoscopy for tissue diagnosis.  Mediastinal lymphadenopathy, consistent with metastatic disease.  Small left pleural effusion.  Indeterminate pulmonary nodules in the left lung base, largest measuring 8 mm. PET-CT scan should be considered for further evaluation.   Electronically Signed   By: Earle Gell M.D.   On:      Recent  Lab Findings: Lab Results  Component Value Date   WBC 7.7 11/29/2013      Assessment / Plan:   # 1-  4 cm central left upper lobe mass which involves the hilum and causes left upper lobe collapse- consistent with clinical stage IIIa carcinoma of the lung- I discussed these findings with the patient in detail including the probable diagnosis of lung cancer-advanced stage. I have recommended proceeding with bronchoscopy  EBUS for tissue dx    Assuming the pathology will be available she will have an appointment in the multidisciplinary thoracic oncology clinic on October 29 to see medical and radiation oncology depending on the final pathology and radiographic reports.    The goals risks and alternatives of the planned surgical procedure bronchoscopy with portacath  have been discussed with the patient in detail. The risks of the procedure including death, infection, stroke, myocardial infarction, bleeding, blood transfusion have all been discussed specifically.  I have quoted Larkin A [place] a 0.5%% of perioperative mortality and a complication rate as high as 10 %. The patient's questions have been answered.Tammie Clay is willing  to proceed with the planned procedure.    Grace Isaac MD      [address] [place] [address] Office [phone]   Beeper [phone]  12/10/2013 6:55 AM

## 2013-12-10 NOTE — Discharge Instructions (Addendum)
Implanted Port Insertion, Care After Refer to this sheet in the next few weeks. These instructions provide you with information on caring for yourself after your procedure. Your health care provider may also give you more specific instructions. Your treatment has been planned according to current medical practices, but problems sometimes occur. Call your health care provider if you have any problems or questions after your procedure. WHAT TO EXPECT AFTER THE PROCEDURE After your procedure, it is typical to have the following:   Discomfort at the port insertion site. Ice packs to the area will help.  Bruising on the skin over the port. This will subside in 3-4 days. HOME CARE INSTRUCTIONS  After your port is placed, you will get a manufacturer's information card. The card has information about your port. Keep this card with you at all times.   Know what kind of port you have. There are many types of ports available.   Wear a medical alert bracelet in case of an emergency. This can help alert health care workers that you have a port.   The port can stay in for as long as your health care provider believes it is necessary.   A home health care nurse may give medicines and take care of the port.   You or a family member can get special training and directions for giving medicine and taking care of the port at home.  SEEK MEDICAL CARE IF:   Your port does not flush or you are unable to get a blood return.   You have a fever or chills. SEEK IMMEDIATE MEDICAL CARE IF:  You have new fluid or pus coming from your incision.   You notice a bad smell coming from your incision site.   You have swelling, pain, or more redness at the incision or port site.   You have chest pain or shortness of breath. Document Released: 11/14/2012 Document Revised: 01/29/2013 Document Reviewed: 11/14/2012 [facility] Patient Information 2015 [place], [place]. This information is not intended to replace  advice given to you by your health care provider. Make sure you discuss any questions you have with your health care provider.  What to eat:  For your first meals, you should eat lightly; only small meals initially.  If you do not have nausea, you may eat larger meals.  Avoid spicy, greasy and heavy food.    General Anesthesia, Adult, Care After  Refer to this sheet in the next few weeks. These instructions provide you with information on caring for yourself after your procedure. Your health care provider may also give you more specific instructions. Your treatment has been planned according to current medical practices, but problems sometimes occur. Call your health care provider if you have any problems or questions after your procedure.  WHAT TO EXPECT AFTER THE PROCEDURE  After the procedure, it is typical to experience:  Sleepiness.  Nausea and vomiting. HOME CARE INSTRUCTIONS  For the first 24 hours after general anesthesia:  Have a responsible person with you.  Do not drive a car. If you are alone, do not take public transportation.  Do not drink alcohol.  Do not take medicine that has not been prescribed by your health care provider.  Do not sign important papers or make important decisions.  You may resume a normal diet and activities as directed by your health care provider.  Change bandages (dressings) as directed.  If you have questions or problems that seem related to general anesthesia, call the hospital  and ask for the anesthetist or anesthesiologist on call. SEEK MEDICAL CARE IF:  You have nausea and vomiting that continue the day after anesthesia.  You develop a rash. SEEK IMMEDIATE MEDICAL CARE IF:  You have difficulty breathing.  You have chest pain.  You have any allergic problems. Document Released: 05/02/2000 Document Revised: 09/26/2012 Document Reviewed: 08/09/2012  [facility] Patient Information 2014 [place], [place].

## 2013-12-10 NOTE — Anesthesia Preprocedure Evaluation (Addendum)
Anesthesia Evaluation  Patient identified by MRN, date of birth, ID band Patient awake    Reviewed: Allergy & Precautions, H&P , NPO status , Patient's Chart, lab work & pertinent test results  Airway        Dental   Pulmonary COPDformer smoker,  Multiple lung nodules         Cardiovascular hypertension, + CAD     Neuro/Psych    GI/Hepatic GERD-  ,  Endo/Other    Renal/GU      Musculoskeletal   Abdominal   Peds  Hematology   Anesthesia Other Findings   Reproductive/Obstetrics                            Anesthesia Physical Anesthesia Plan  ASA: III  Anesthesia Plan: MAC   Post-op Pain Management:    Induction: Intravenous  Airway Management Planned: Natural Airway and Simple Face Mask  Additional Equipment:   Intra-op Plan:   Post-operative Plan: Extubation in OR  Informed Consent: I have reviewed the patients History and Physical, chart, labs and discussed the procedure including the risks, benefits and alternatives for the proposed anesthesia with the patient or authorized representative who has indicated his/her understanding and acceptance.     Plan Discussed with: CRNA and Surgeon  Anesthesia Plan Comments:         Anesthesia Quick Evaluation

## 2013-12-10 NOTE — Transfer of Care (Signed)
Immediate Anesthesia Transfer of Care Note  Patient: Tammie Clay  Procedure(s) Performed: Procedure(s): INSERTION PORT-A-CATH (Left)  Patient Location: PACU  Anesthesia Type:MAC  Level of Consciousness: awake, alert , oriented and patient cooperative  Airway & Oxygen Therapy: Patient Spontanous Breathing and Patient connected to face mask oxygen  Post-op Assessment: Report given to PACU RN, Post -op Vital signs reviewed and stable and Patient moving all extremities  Post vital signs: Reviewed and stable  Complications: No apparent anesthesia complications

## 2013-12-10 NOTE — Brief Op Note (Signed)
      [address]       [place] [address]             [phone]      12/10/2013  8:38 AM  PATIENT:  Tammie Clay  68 y.o. female  PRE-OPERATIVE DIAGNOSIS:  LUNG CANCER  POST-OPERATIVE DIAGNOSIS:  LUNG CANCER  PROCEDURE:  Procedure(s): INSERTION PORT-A-CATH (Left) with fluro and sonosite vascular US guidance   SURGEON:  Surgeon(s) and Role:    * Grace Isaac, MD - Primary  PHYSICIAN ASSISTANT:   ASSISTANTS: none   ANESTHESIA:   local and MAC  EBL:   none  BLOOD ADMINISTERED:none  DRAINS: none   LOCAL MEDICATIONS USED:  LIDOCAINE  and Amount: 10 ml  SPECIMEN:  No Specimen  DISPOSITION OF SPECIMEN:  N/A  COUNTS:  YES  TOURNIQUET:  * No tourniquets in log *  DICTATION: .Dragon Dictation  PLAN OF CARE: Discharge to home after PACU  PATIENT DISPOSITION:  PACU - hemodynamically stable.   Delay start of Pharmacological VTE agent (>24hrs) due to surgical blood loss or risk of bleeding: yes

## 2013-12-10 NOTE — Telephone Encounter (Signed)
pt called and needed to r/s first chemo.l.Marland Kitchendone....pt aware of d.t...she wanted to discuss with son what she is going thru

## 2013-12-10 NOTE — Progress Notes (Signed)
Called for sign out

## 2013-12-11 ENCOUNTER — Other Ambulatory Visit: Payer: Medicare Other

## 2013-12-11 ENCOUNTER — Ambulatory Visit: Payer: Medicare Other

## 2013-12-11 ENCOUNTER — Encounter (HOSPITAL_COMMUNITY): Payer: Self-pay | Admitting: Cardiothoracic Surgery

## 2013-12-11 ENCOUNTER — Ambulatory Visit: Payer: Medicare Other | Admitting: Adult Health

## 2013-12-11 ENCOUNTER — Encounter: Payer: Self-pay | Admitting: *Deleted

## 2013-12-18 ENCOUNTER — Ambulatory Visit (HOSPITAL_BASED_OUTPATIENT_CLINIC_OR_DEPARTMENT_OTHER): Payer: Medicare Other | Admitting: Nurse Practitioner

## 2013-12-18 ENCOUNTER — Other Ambulatory Visit (HOSPITAL_BASED_OUTPATIENT_CLINIC_OR_DEPARTMENT_OTHER): Payer: Medicare Other

## 2013-12-18 ENCOUNTER — Encounter: Payer: Self-pay | Admitting: Nurse Practitioner

## 2013-12-18 ENCOUNTER — Ambulatory Visit (HOSPITAL_BASED_OUTPATIENT_CLINIC_OR_DEPARTMENT_OTHER): Payer: Medicare Other

## 2013-12-18 VITALS — BP 132/83 | HR 87 | Temp 98.0°F | Resp 18 | Ht 62.0 in | Wt 172.5 lb

## 2013-12-18 DIAGNOSIS — C782 Secondary malignant neoplasm of pleura: Secondary | ICD-10-CM

## 2013-12-18 DIAGNOSIS — C3412 Malignant neoplasm of upper lobe, left bronchus or lung: Secondary | ICD-10-CM

## 2013-12-18 DIAGNOSIS — G893 Neoplasm related pain (acute) (chronic): Secondary | ICD-10-CM

## 2013-12-18 DIAGNOSIS — C3492 Malignant neoplasm of unspecified part of left bronchus or lung: Secondary | ICD-10-CM

## 2013-12-18 DIAGNOSIS — R0781 Pleurodynia: Secondary | ICD-10-CM

## 2013-12-18 DIAGNOSIS — Z5111 Encounter for antineoplastic chemotherapy: Secondary | ICD-10-CM

## 2013-12-18 LAB — CBC WITH DIFFERENTIAL/PLATELET
BASO%: 0.9 % (ref 0.0–2.0)
Basophils Absolute: 0.1 10*3/uL (ref 0.0–0.1)
EOS ABS: 0.2 10*3/uL (ref 0.0–0.5)
EOS%: 2.6 % (ref 0.0–7.0)
HCT: 41.8 % (ref 34.8–46.6)
HGB: 13.8 g/dL (ref 11.6–15.9)
LYMPH#: 1.4 10*3/uL (ref 0.9–3.3)
LYMPH%: 15.2 % (ref 14.0–49.7)
MCH: 29.3 pg (ref 25.1–34.0)
MCHC: 33 g/dL (ref 31.5–36.0)
MCV: 88.8 fL (ref 79.5–101.0)
MONO#: 0.7 10*3/uL (ref 0.1–0.9)
MONO%: 8 % (ref 0.0–14.0)
NEUT%: 73.3 % (ref 38.4–76.8)
NEUTROS ABS: 6.6 10*3/uL — AB (ref 1.5–6.5)
PLATELETS: 283 10*3/uL (ref 145–400)
RBC: 4.71 10*6/uL (ref 3.70–5.45)
RDW: 14.1 % (ref 11.2–14.5)
WBC: 8.9 10*3/uL (ref 3.9–10.3)

## 2013-12-18 LAB — COMPREHENSIVE METABOLIC PANEL (CC13)
ALBUMIN: 3.7 g/dL (ref 3.5–5.0)
ALT: 17 U/L (ref 0–55)
AST: 15 U/L (ref 5–34)
Alkaline Phosphatase: 87 U/L (ref 40–150)
Anion Gap: 8 mEq/L (ref 3–11)
BILIRUBIN TOTAL: 0.38 mg/dL (ref 0.20–1.20)
BUN: 20.5 mg/dL (ref 7.0–26.0)
CALCIUM: 9.9 mg/dL (ref 8.4–10.4)
CHLORIDE: 101 meq/L (ref 98–109)
CO2: 31 meq/L — AB (ref 22–29)
Creatinine: 0.8 mg/dL (ref 0.6–1.1)
GLUCOSE: 94 mg/dL (ref 70–140)
POTASSIUM: 3.8 meq/L (ref 3.5–5.1)
SODIUM: 140 meq/L (ref 136–145)
Total Protein: 7.2 g/dL (ref 6.4–8.3)

## 2013-12-18 MED ORDER — HYDROCODONE-ACETAMINOPHEN 5-325 MG PO TABS
ORAL_TABLET | ORAL | Status: AC
  Filled 2013-12-18: qty 1

## 2013-12-18 MED ORDER — DEXAMETHASONE SODIUM PHOSPHATE 20 MG/5ML IJ SOLN
20.0000 mg | Freq: Once | INTRAMUSCULAR | Status: AC
  Administered 2013-12-18: 20 mg via INTRAVENOUS

## 2013-12-18 MED ORDER — HEPARIN SOD (PORK) LOCK FLUSH 100 UNIT/ML IV SOLN
500.0000 [IU] | Freq: Once | INTRAVENOUS | Status: AC | PRN
  Administered 2013-12-18: 500 [IU]
  Filled 2013-12-18: qty 5

## 2013-12-18 MED ORDER — SODIUM CHLORIDE 0.9 % IV SOLN
450.0000 mg | Freq: Once | INTRAVENOUS | Status: AC
  Administered 2013-12-18: 450 mg via INTRAVENOUS
  Filled 2013-12-18: qty 45

## 2013-12-18 MED ORDER — ONDANSETRON 16 MG/50ML IVPB (CHCC)
INTRAVENOUS | Status: AC
  Filled 2013-12-18: qty 16

## 2013-12-18 MED ORDER — PACLITAXEL PROTEIN-BOUND CHEMO INJECTION 100 MG
100.0000 mg/m2 | Freq: Once | INTRAVENOUS | Status: AC
  Administered 2013-12-18: 175 mg via INTRAVENOUS
  Filled 2013-12-18: qty 35

## 2013-12-18 MED ORDER — SODIUM CHLORIDE 0.9 % IJ SOLN
10.0000 mL | INTRAMUSCULAR | Status: DC | PRN
  Administered 2013-12-18: 10 mL
  Filled 2013-12-18: qty 10

## 2013-12-18 MED ORDER — DEXAMETHASONE SODIUM PHOSPHATE 20 MG/5ML IJ SOLN
INTRAMUSCULAR | Status: AC
  Filled 2013-12-18: qty 5

## 2013-12-18 MED ORDER — ONDANSETRON 16 MG/50ML IVPB (CHCC)
16.0000 mg | Freq: Once | INTRAVENOUS | Status: AC
  Administered 2013-12-18: 16 mg via INTRAVENOUS

## 2013-12-18 MED ORDER — HYDROCODONE-ACETAMINOPHEN 5-325 MG PO TABS
1.0000 | ORAL_TABLET | ORAL | Status: DC | PRN
  Administered 2013-12-18: 1 via ORAL

## 2013-12-18 MED ORDER — SODIUM CHLORIDE 0.9 % IV SOLN
Freq: Once | INTRAVENOUS | Status: AC
  Administered 2013-12-18: 14:00:00 via INTRAVENOUS

## 2013-12-18 NOTE — Patient Instructions (Signed)
[facility] Discharge Instructions for Patients Receiving Chemotherapy  Today you received the following chemotherapy agents: abraxane, carboplatin  To help prevent nausea and vomiting after your treatment, we encourage you to take your nausea medication.  Take it as often as prescribed.     If you develop nausea and vomiting that is not controlled by your nausea medication, call the clinic. If it is after clinic hours your family physician or the after hours number for the clinic or go to the Emergency Department.   BELOW ARE SYMPTOMS THAT SHOULD BE REPORTED IMMEDIATELY:  *FEVER GREATER THAN 100.5 F  *CHILLS WITH OR WITHOUT FEVER  NAUSEA AND VOMITING THAT IS NOT CONTROLLED WITH YOUR NAUSEA MEDICATION  *UNUSUAL SHORTNESS OF BREATH  *UNUSUAL BRUISING OR BLEEDING  TENDERNESS IN MOUTH AND THROAT WITH OR WITHOUT PRESENCE OF ULCERS  *URINARY PROBLEMS  *BOWEL PROBLEMS  UNUSUAL RASH Items with * indicate a potential emergency and should be followed up as soon as possible.  Feel free to call the clinic you have any questions or concerns. The clinic phone number is [phone].   I have been informed and understand all the instructions given to me. I know to contact the clinic, my physician, or go to the Emergency Department if any problems should occur. I do not have any questions at this time, but understand that I may call the clinic during office hours   should I have any questions or need assistance in obtaining follow up care.    __________________________________________  _____________  __________ Signature of Patient or Authorized Representative            Date                   Time    __________________________________________ Nurse's Signature    Nanoparticle Albumin-Bound Paclitaxel injection (abraxane) What is this medicine? NANOPARTICLE ALBUMIN-BOUND PACLITAXEL (Na no PAHR ti kuhl al BYOO muhn-bound PAK li TAX el) is a chemotherapy drug. It  targets fast dividing cells, like cancer cells, and causes these cells to die. This medicine is used to treat advanced breast cancer and advanced lung cancer. This medicine may be used for other purposes; ask your health care provider or pharmacist if you have questions. COMMON BRAND NAME(S): Abraxane What should I tell my health care provider before I take this medicine? They need to know if you have any of these conditions: -kidney disease -liver disease -low blood counts, like low platelets, red blood cells, or white blood cells -recent or ongoing radiation therapy -an unusual or allergic reaction to paclitaxel, albumin, other chemotherapy, other medicines, foods, dyes, or preservatives -pregnant or trying to get pregnant -breast-feeding How should I use this medicine? This drug is given as an infusion into a vein. It is administered in a hospital or clinic by a specially trained health care professional. Talk to your pediatrician regarding the use of this medicine in children. Special care may be needed. Overdosage: If you think you have taken too much of this medicine contact a poison control center or emergency room at once. NOTE: This medicine is only for you. Do not share this medicine with others. What if I miss a dose? It is important not to miss your dose. Call your doctor or health care professional if you are unable to keep an appointment. What may interact with this medicine? -cyclosporine -diazepam -ketoconazole -medicines to increase blood counts like filgrastim, pegfilgrastim, sargramostim -other chemotherapy drugs like cisplatin, doxorubicin, epirubicin, etoposide, teniposide,  vincristine -quinidine -testosterone -vaccines -verapamil Talk to your doctor or health care professional before taking any of these medicines: -acetaminophen -aspirin -ibuprofen -ketoprofen -naproxen This list may not describe all possible interactions. Give your health care provider a list  of all the medicines, herbs, non-prescription drugs, or dietary supplements you use. Also tell them if you smoke, drink alcohol, or use illegal drugs. Some items may interact with your medicine. What should I watch for while using this medicine? Your condition will be monitored carefully while you are receiving this medicine. You will need important blood work done while you are taking this medicine. This drug may make you feel generally unwell. This is not uncommon, as chemotherapy can affect healthy cells as well as cancer cells. Report any side effects. Continue your course of treatment even though you feel ill unless your doctor tells you to stop. In some cases, you may be given additional medicines to help with side effects. Follow all directions for their use. Call your doctor or health care professional for advice if you get a fever, chills or sore throat, or other symptoms of a cold or flu. Do not treat yourself. This drug decreases your body's ability to fight infections. Try to avoid being around people who are sick. This medicine may increase your risk to bruise or bleed. Call your doctor or health care professional if you notice any unusual bleeding. Be careful brushing and flossing your teeth or using a toothpick because you may get an infection or bleed more easily. If you have any dental work done, tell your dentist you are receiving this medicine. Avoid taking products that contain aspirin, acetaminophen, ibuprofen, naproxen, or ketoprofen unless instructed by your doctor. These medicines may hide a fever. Do not become pregnant while taking this medicine. Women should inform their doctor if they wish to become pregnant or think they might be pregnant. There is a potential for serious side effects to an unborn child. Talk to your health care professional or pharmacist for more information. Do not breast-feed an infant while taking this medicine. Men are advised not to father a child while  receiving this medicine. What side effects may I notice from receiving this medicine? Side effects that you should report to your doctor or health care professional as soon as possible: -allergic reactions like skin rash, itching or hives, swelling of the face, lips, or tongue -low blood counts - This drug may decrease the number of white blood cells, red blood cells and platelets. You may be at increased risk for infections and bleeding. -signs of infection - fever or chills, cough, sore throat, pain or difficulty passing urine -signs of decreased platelets or bleeding - bruising, pinpoint red spots on the skin, black, tarry stools, nosebleeds -signs of decreased red blood cells - unusually weak or tired, fainting spells, lightheadedness -breathing problems -changes in vision -chest pain -high or low blood pressure -mouth sores -nausea and vomiting -pain, swelling, redness or irritation at the injection site -pain, tingling, numbness in the hands or feet -slow or irregular heartbeat -swelling of the ankle, feet, hands Side effects that usually do not require medical attention (report to your doctor or health care professional if they continue or are bothersome): -aches, pains -changes in the color of fingernails -diarrhea -hair loss -loss of appetite This list may not describe all possible side effects. Call your doctor for medical advice about side effects. You may report side effects to FDA at 1-800-FDA-1088. Where should I keep my medicine?  This drug is given in a hospital or clinic and will not be stored at home. NOTE: This sheet is a summary. It may not cover all possible information. If you have questions about this medicine, talk to your doctor, pharmacist, or health care provider.  2015, [employer]. (2012-03-19 16:48:50)   Carboplatin injection What is this medicine? CARBOPLATIN (KAR boe pla tin) is a chemotherapy drug. It targets fast dividing cells, like  cancer cells, and causes these cells to die. This medicine is used to treat ovarian cancer and many other cancers. This medicine may be used for other purposes; ask your health care provider or pharmacist if you have questions. COMMON BRAND NAME(S): Paraplatin What should I tell my health care provider before I take this medicine? They need to know if you have any of these conditions: -blood disorders -hearing problems -kidney disease -recent or ongoing radiation therapy -an unusual or allergic reaction to carboplatin, cisplatin, other chemotherapy, other medicines, foods, dyes, or preservatives -pregnant or trying to get pregnant -breast-feeding How should I use this medicine? This drug is usually given as an infusion into a vein. It is administered in a hospital or clinic by a specially trained health care professional. Talk to your pediatrician regarding the use of this medicine in children. Special care may be needed. Overdosage: If you think you have taken too much of this medicine contact a poison control center or emergency room at once. NOTE: This medicine is only for you. Do not share this medicine with others. What if I miss a dose? It is important not to miss a dose. Call your doctor or health care professional if you are unable to keep an appointment. What may interact with this medicine? -medicines for seizures -medicines to increase blood counts like filgrastim, pegfilgrastim, sargramostim -some antibiotics like amikacin, gentamicin, neomycin, streptomycin, tobramycin -vaccines Talk to your doctor or health care professional before taking any of these medicines: -acetaminophen -aspirin -ibuprofen -ketoprofen -naproxen This list may not describe all possible interactions. Give your health care provider a list of all the medicines, herbs, non-prescription drugs, or dietary supplements you use. Also tell them if you smoke, drink alcohol, or use illegal drugs. Some items may  interact with your medicine. What should I watch for while using this medicine? Your condition will be monitored carefully while you are receiving this medicine. You will need important blood work done while you are taking this medicine. This drug may make you feel generally unwell. This is not uncommon, as chemotherapy can affect healthy cells as well as cancer cells. Report any side effects. Continue your course of treatment even though you feel ill unless your doctor tells you to stop. In some cases, you may be given additional medicines to help with side effects. Follow all directions for their use. Call your doctor or health care professional for advice if you get a fever, chills or sore throat, or other symptoms of a cold or flu. Do not treat yourself. This drug decreases your body's ability to fight infections. Try to avoid being around people who are sick. This medicine may increase your risk to bruise or bleed. Call your doctor or health care professional if you notice any unusual bleeding. Be careful brushing and flossing your teeth or using a toothpick because you may get an infection or bleed more easily. If you have any dental work done, tell your dentist you are receiving this medicine. Avoid taking products that contain aspirin, acetaminophen, ibuprofen, naproxen, or ketoprofen unless  instructed by your doctor. These medicines may hide a fever. Do not become pregnant while taking this medicine. Women should inform their doctor if they wish to become pregnant or think they might be pregnant. There is a potential for serious side effects to an unborn child. Talk to your health care professional or pharmacist for more information. Do not breast-feed an infant while taking this medicine. What side effects may I notice from receiving this medicine? Side effects that you should report to your doctor or health care professional as soon as possible: -allergic reactions like skin rash, itching or  hives, swelling of the face, lips, or tongue -signs of infection - fever or chills, cough, sore throat, pain or difficulty passing urine -signs of decreased platelets or bleeding - bruising, pinpoint red spots on the skin, black, tarry stools, nosebleeds -signs of decreased red blood cells - unusually weak or tired, fainting spells, lightheadedness -breathing problems -changes in hearing -changes in vision -chest pain -high blood pressure -low blood counts - This drug may decrease the number of white blood cells, red blood cells and platelets. You may be at increased risk for infections and bleeding. -nausea and vomiting -pain, swelling, redness or irritation at the injection site -pain, tingling, numbness in the hands or feet -problems with balance, talking, walking -trouble passing urine or change in the amount of urine Side effects that usually do not require medical attention (report to your doctor or health care professional if they continue or are bothersome): -hair loss -loss of appetite -metallic taste in the mouth or changes in taste This list may not describe all possible side effects. Call your doctor for medical advice about side effects. You may report side effects to FDA at 1-800-FDA-1088. Where should I keep my medicine? This drug is given in a hospital or clinic and will not be stored at home. NOTE: This sheet is a summary. It may not cover all possible information. If you have questions about this medicine, talk to your doctor, pharmacist, or health care provider.  2015, [employer]. (2007-05-01 14:38:05)

## 2013-12-18 NOTE — Progress Notes (Signed)
[place] [phone]   [phone] Thoracic oncology clinic CONSULT NOTE  REFERRING PHYSICIAN: Dr. Martin Majestic  REASON FOR CONSULTATION:  68 years old white female recently diagnosed with lung cancer  HPI Breyah A Goulart is a 68 y.o. female with past medical history significant for COPD, GERD, osteoporosis, dyslipidemia as well as long history of smoking but quit last year. The patient was seen by her primary care physician, Dr. Serita Grammes for routine evaluation. She had chest x-ray performed on 11/15/2013 and it showed moderate prominence of the left hilum was lobulated N border suspicious for left hilar mass/adenopathy. This was followed by CT scan of the chest on 11/19/2013 and it showed 4.0 cm central left upper lobe mass which involved the hilum and closest left upper lobe collapse. This is consistent with primary bronchogenic carcinoma. There was mediastinal lymphadenopathy consistent with metastatic disease and a small left pleural effusion. There was also indeterminate pulmonary nodules in the left lung base the largest measured 0.8 cm. The patient was referred to Dr. Servando Snare and a PET scan was performed on 11/28/2013. It showed central left lung mass which measured approximately 5.0 x 4.0 x 4.0 cm with maximum SUV of 13.9. The mass obstructs the left upper lobe airway and there was postoperative obstructive atelectasis of the left upper lobe. There was also evidence of pleural spread of tumor including the pleural-based nodule within the posterior and medial left lower lobe hemithorax with SUV max of 8.3 and the pleural nodule mass within the left hemithorax appears to involve both the visceral and parietal pleura. It measured 2.0 cm and has SUV max of 15.6. Hypermetabolic pre-vascular and sub- carinal lymph node metastasis identified. The sub- carinal lymph node measure 2.2 cm and has an SUV max equal to 13.7. Increased uptake in the area of the  anterior aspect of the left fourth rib is identified. This has an SUV max equal to 4.3. Given the absence of distant osseous metastasis this is likely felt to represent volume-averaging within adjacent pleural metastasis.  MRI of the brain on 11/27/2013 showed no acute intracranial process identified. On 12/03/2013 the patient underwent a video bronchoscopy with endobronchial ultrasound with biopsies under the care of Dr. Servando Snare. The final pathology (Accession: [id number]) was consistent with invasive squamous cell carcinoma.  INTERVAL HISTORY: Nylan returns today for follow up of her metastatic lung cancer, accompanied by her husband. She has had a port placed and has attended chemotherapy school. She is to begin carboplatin AUC 4 and abraxane today, with carboplatin given on day 1 and abraxane given on days 1, 8, and 15 of every 21 day cycle. Morene is anxious to start treatment today.   Family history significant for heart disease in both mother and father. The patient is married and has 3 children. She was accompanied today by her husband, son and daughter. She is currently retired and used to work as a Radiation protection practitioner for [employer]. She has a history of smoking 1 pack per day for around 55 years and quit last year. The patient has no history of alcohol or drug abuse.  HPI  Past Medical History  Diagnosis Date  . Hypertension   . COPD (chronic obstructive pulmonary disease)   . Hyperlipidemia   . GERD (gastroesophageal reflux disease)   . Osteopenia   . Insomnia   . Mediastinal lymphadenopathy 11/19/13    PER CT  . Multiple lung nodules on CT 11/19/13  LEFT LUNG BASE  . Lung mass 11/19/13    RUL PER CT  . Coronary artery calcification seen on CAT scan 11/21/2013  . COPD (chronic obstructive pulmonary disease) 11/21/2013  . Pneumonia     hx of  . Anxiety     Past Surgical History  Procedure Laterality Date  .  c section x 2    . Urethral cyst removed    .  Tonsillectomy    . Appendectomy    . Colonoscopy with propofol N/A 07/17/2012    Procedure: COLONOSCOPY WITH PROPOFOL;  Surgeon: Garlan Fair, MD;  Location: [facility];  Service: Endoscopy;  Laterality: N/A;  . Esophagogastroduodenoscopy (egd) with propofol N/A 07/17/2012    Procedure: ESOPHAGOGASTRODUODENOSCOPY (EGD) WITH PROPOFOL;  Surgeon: Garlan Fair, MD;  Location: [facility];  Service: Endoscopy;  Laterality: N/A;  . Video bronchoscopy with endobronchial ultrasound N/A 12/03/2013    Procedure: VIDEO BRONCHOSCOPY WITH ENDOBRONCHIAL ULTRASOUND WITH BIOPSIES;  Surgeon: Grace Isaac, MD;  Location: [place];  Service: Thoracic;  Laterality: N/A;  . Portacath placement Left 12/10/2013    Procedure: INSERTION PORT-A-CATH;  Surgeon: Grace Isaac, MD;  Location: [place];  Service: Thoracic;  Laterality: Left;    Family History  Problem Relation Age of Onset  . Diabetes Father   . Heart disease Father     MI  . CVA Mother   . Cancer Cousin     LIPOSARCOMA  . Heart disease Cousin     HEART TRANSPLANTS X 2    Social History History  Substance Use Topics  . Smoking status: Former Smoker -- 0.50 packs/day for 50 years    Types: Cigarettes    Quit date: 10/08/2012  . Smokeless tobacco: Never Used     Comment: smokes Vapor daily  . Alcohol Use: Yes     Comment: social    No Known Allergies  Current Outpatient Prescriptions  Medication Sig Dispense Refill  . albuterol (PROVENTIL HFA;VENTOLIN HFA) 108 (90 BASE) MCG/ACT inhaler Inhale 2 puffs into the lungs every 6 (six) hours as needed for wheezing.    . diazepam (VALIUM) 5 MG tablet Take 5 mg by mouth at bedtime.    . DULoxetine (CYMBALTA) 30 MG capsule Take 1 capsule (30 mg total) by mouth daily. 30 capsule 2  . HYDROcodone-acetaminophen (NORCO/VICODIN) 5-325 MG per tablet Take 1-2 tablets by mouth every 6 (six) hours as needed for moderate pain.    Marland Kitchen lidocaine-prilocaine (EMLA) cream Apply 1 application topically  as needed. 30 g 0  . lisinopril-hydrochlorothiazide (PRINZIDE,ZESTORETIC) 20-12.5 MG per tablet Take 2 tablets by mouth every morning.     . metoprolol succinate (TOPROL-XL) 50 MG 24 hr tablet Take 50 mg by mouth every morning. Take with or immediately following a meal.    . Multiple Vitamin (MULTIVITAMIN WITH MINERALS) TABS Take 1 tablet by mouth daily.    . Omega-3 Fatty Acids (OMEGA-3 FISH OIL PO) Take 1 capsule by mouth daily.    . prochlorperazine (COMPAZINE) 10 MG tablet Take 1 tablet (10 mg total) by mouth every 6 (six) hours as needed for nausea or vomiting. 30 tablet 0  . tiotropium (SPIRIVA) 18 MCG inhalation capsule Place 18 mcg into inhaler and inhale daily.    Marland Kitchen aspirin 81 MG tablet Take 81 mg by mouth daily.    Marland Kitchen atorvastatin (LIPITOR) 40 MG tablet Take 40 mg by mouth daily.    . Calcium Carbonate-Vitamin D (CALCIUM-D) 600-400 MG-UNIT TABS Take 1 tablet by  mouth 2 (two) times daily.    Marland Kitchen VITAMIN E PO Take 1 capsule by mouth daily.     No current facility-administered medications for this visit.    Review of Systems  Constitutional: negative Eyes: negative Ears, nose, mouth, throat, and face: negative Respiratory: negative Cardiovascular: negative Gastrointestinal: negative Genitourinary:negative Integument/breast: negative Hematologic/lymphatic: negative Musculoskeletal:positive for pain to left rib cage Neurological: negative Behavioral/Psych: positive for anxiety Endocrine: negative Allergic/Immunologic: negative  Physical Exam  Skin: warm, dry  HEENT: sclerae anicteric, conjunctivae pink, oropharynx clear. No thrush or mucositis.  Lymph Nodes: No cervical or supraclavicular lymphadenopathy  Lungs: clear to auscultation bilaterally, no rales, wheezes, or rhonci  Heart: regular rate and rhythm  Abdomen: round, soft, non tender, positive bowel sounds  Musculoskeletal: No focal spinal tenderness, no peripheral edema  Neuro: non focal, well oriented, positive  affect  Breasts: deferred  PERFORMANCE STATUS: ECOG 1  LABORATORY DATA: Lab Results  Component Value Date   WBC 8.9 12/18/2013   HGB 13.8 12/18/2013   HCT 41.8 12/18/2013   MCV 88.8 12/18/2013   PLT 283 12/18/2013      Chemistry      Component Value Date/Time   NA 140 12/18/2013 1037   NA 138 12/10/2013 0637   K 3.8 12/18/2013 1037   K 4.7 12/10/2013 0637   CL 99 12/10/2013 0637   CO2 31* 12/18/2013 1037   CO2 28 12/10/2013 0637   BUN 20.5 12/18/2013 1037   BUN 22 12/10/2013 0637   CREATININE 0.8 12/18/2013 1037   CREATININE 0.75 12/10/2013 0637   CREATININE 0.80 11/22/2013 1054      Component Value Date/Time   CALCIUM 9.9 12/18/2013 1037   CALCIUM 9.8 12/10/2013 0637   ALKPHOS 87 12/18/2013 1037   ALKPHOS 77 12/10/2013 0637   AST 15 12/18/2013 1037   AST 16 12/10/2013 0637   ALT 17 12/18/2013 1037   ALT 16 12/10/2013 0637   BILITOT 0.38 12/18/2013 1037   BILITOT 0.4 12/10/2013 0637       RADIOGRAPHIC STUDIES: Dg Chest 2 View Within Previous 72 Hours.  Films Obtained On Friday Are Acceptable For Monday And Tuesday Cases  11/29/2013   CLINICAL DATA:  Left upper lobe mass.  Patient for VATS.  EXAM: CHEST  2 VIEW  COMPARISON:  CT chest 11/28/2013.  PA and lateral chest 11/15/2013.  FINDINGS: Opacity projected in the left hilum correlates with left upper lobe mass seen on the prior studies. Calcified granuloma in the right upper lobe is noted. The lungs are otherwise clear. Heart size is normal. No pneumothorax or pleural effusion. No focal bony abnormality.  IMPRESSION: No acute finding in patient with a left upper lobe mass.   Electronically Signed   By: Inge Rise M.D.   On: 11/29/2013 14:21   Dg Chest 2 View  11/15/2013   CLINICAL DATA:  Left upper quadrant pain with chest discomfort. COPD.  EXAM: CHEST  2 VIEW  COMPARISON:  03/29/2012 and 03/17/2006  FINDINGS: Lungs are hypoinflated and demonstrate increased density with somewhat sharp a lobular lateral  border over the left hilar region. Couple small stable right perihilar nodules likely calcified granulomas unchanged from 2008. No evidence of pleural effusion. Cardiac silhouette is within normal. There is mild calcified plaque over the thoracic aorta. There are mild degenerative changes of the spine.  IMPRESSION: Moderate prominence of the left hilum with lobulated lateral border suspicious for left hilar mass/adenopathy. Recommend contrast-enhanced chest CT for further evaluation.   Electronically Signed  By: Marin Olp M.D.   On: 11/15/2013 15:20   Ct Chest W Contrast  11/19/2013   CLINICAL DATA:  Left hilar mass on chest radiograph. Left upper quadrant pain.  EXAM: CT CHEST WITH CONTRAST  TECHNIQUE: Multidetector CT imaging of the chest was performed during intravenous contrast administration.  CONTRAST:  39mL OMNIPAQUE IOHEXOL 300 MG/ML  SOLN  COMPARISON:  Chest radiograph on 11/15/2013  FINDINGS: Mediastinum/Hilar Regions: Mediastinal lymphadenopathy is seen in the lateral aortic region measuring 1.6 x 4.3 cm on image 20. Sub- carina lymphadenopathy seen measuring 2.1 cm on image 26.  Other Thoracic Lymphadenopathy:  None.  Lungs: Left upper lobe collapse is seen with a central low-attenuation mass measuring 3.9 x 4.2 cm. This is contiguous with the left hilum obstructs the central left upper lobe bronchus.  In addition, there is an 8 mm indeterminate nodular density in the anterior left lung base on image 38. There are also several subpleural pulmonary nodular densities in the left lower lobe, largest measuring 7 mm on image 44.  Pleura:  Small left pleural effusion noted.  Vascular/Cardiac:  No acute findings identified.  Musculoskeletal:  No suspicious bone lesions identified.  Other:  None.  IMPRESSION: 4 cm central left upper lobe mass which involves the hilum and causes left upper lobe collapse. This is consistent with primary bronchogenic carcinoma. Consider bronchoscopy for tissue diagnosis.   Mediastinal lymphadenopathy, consistent with metastatic disease.  Small left pleural effusion.  Indeterminate pulmonary nodules in the left lung base, largest measuring 8 mm. PET-CT scan should be considered for further evaluation.   Electronically Signed   By: Earle Gell M.D.   On: 11/19/2013 11:41   Mr Jeri Cos IO Contrast  11/30/2013   CLINICAL DATA:  History of left upper lobe lung lesion and COPD.  EXAM: MRI HEAD WITHOUT AND WITH CONTRAST  TECHNIQUE: Multiplanar, multiecho pulse sequences of the brain and surrounding structures were obtained without and with intravenous contrast.  CONTRAST:  95mL MULTIHANCE GADOBENATE DIMEGLUMINE 529 MG/ML IV SOLN  COMPARISON:  None.  FINDINGS: Mild diffuse prominence of the CSF containing spaces compatible with generalized atrophy. Scattered and confluent T2/FLAIR hyperintensity within the periventricular and deep white matter are noted, nonspecific, but likely related to mild chronic small vessel ischemic changes.  No focal parenchymal signal abnormality is identified. No mass lesion, midline shift, or extra-axial fluid collection. Ventricles are normal in size without evidence of hydrocephalus. No abnormal enhancement.  No diffusion-weighted signal abnormality is identified to suggest acute intracranial infarct. Gray-white matter differentiation is maintained. Normal flow voids are seen within the intracranial vasculature. No intracranial hemorrhage identified.  The cervicomedullary junction is normal. Pituitary gland is within normal limits. Pituitary stalk is midline. The globes and optic nerves demonstrate a normal appearance with normal signal intensity. The  The bone marrow signal intensity is normal. Calvarium is intact. Visualized upper cervical spine is within normal limits.  Scalp soft tissues are unremarkable.  Paranasal sinuses are clear.  No mastoid effusion.  IMPRESSION: 1. No acute intracranial process identified. Specifically, no mass lesion or evidence of  intracranial metastasis. 2. Mild age-related atrophy with chronic small vessel ischemic disease.   Electronically Signed   By: Jeannine Boga M.D.   On: 11/30/2013 06:37   Nm Pet Image Initial (pi) Skull Base To Thigh  11/28/2013   CLINICAL DATA:  Initial treatment strategy for Lung cancer.  EXAM: NUCLEAR MEDICINE PET SKULL BASE TO THIGH  TECHNIQUE: 9.3 mCi F-18 FDG was injected intravenously.  Full-ring PET imaging was performed from the skull base to thigh after the radiotracer. CT data was obtained and used for attenuation correction and anatomic localization.  FASTING BLOOD GLUCOSE:  Value: 93 mg/dl  COMPARISON:  None.  FINDINGS: NECK  No hypermetabolic lymph nodes in the neck.  CHEST  Central left lung mass is identified. This measure approximately 5 x 4 x 4 cm. The SUV max associated with this mass is equal to 13.9 cm. The mass obstructs the left upper lobe airway and there is postobstructive atelectasis of the left upper lobe. A small left pleural effusion is identified. Evidence of pleural spread of tumor is identified. Pleural base nodule within the posterior and medial left lower left hemi thorax has an SUV max equal to 8.3. Pleural nodule mass within the left hemi thorax appears to involve both the visceral and parietal pleura. This measures approximately 2 cm and has an SUV max equal to 15.6. Calcified granulomas identified in both lungs. There are moderate changes of centrilobular emphysema.  Hypermetabolic pre-vascular and sub- carinal lymph node metastasis identified. The sub- carinal lymph node measure 2.2 cm and has an SUV max equal to 13.7.  ABDOMEN/PELVIS  No abnormal hypermetabolic activity within the liver, pancreas, adrenal glands, or spleen. No hypermetabolic lymph nodes in the abdomen or pelvis. A cyst within the left adnexa measures 5.2 cm  SKELETON  Increased uptake in the area of the anterior aspect of the left fourth rib is identified. This has an SUV max equal to 4.3. Given the  absence of distant osseous metastasis this is likely felt to represent volume-averaging within adjacent pleural metastasis.  IMPRESSION: 1. Central, invasive left upper lobe lung mass with associated postobstructive atelectasis is identified and is concerning for primary bronchogenic carcinoma. There is ipsilateral and sub- carinal lymph node metastasis as well as trans pleural spread of tumor within the left hemi thorax. Assuming non-small cell lung cancer this would be considered T3N2M1a or Stage 4 disease.   Electronically Signed   By: Kerby Moors M.D.   On: 11/28/2013 09:30    ASSESSMENT: This is a very pleasant 55 his old white female recently diagnosed with stage IV (T2, N2, M1a) non-small cell lung cancer, squamous cell carcinoma presented with central left upper lobe lung mass with associated postobstructive atelectasis as well as ipsilateral and subcarinal lymphadenopathy as well as pleural tumor spread diagnosed in October 2015.   PLAN: The labs were reviewed in detail and were stable. She will proceed with day 1, cycle 1 of carboplatin (AUC 5) and abraxane today. She endorses pain 5/10 to her left rib cage and left her pain meds at home, so I have written for a one time dose of 1-2 tabs norco 5-325mg  to be given back in the treatment room with her premedications.  Darryl and I reviewed potential side effects and toxicities of this chemo regimen, including myelosuppression, nausea, vomiting, changes in bowel habits, peripheral neuropathy, and alopecia. Eadie has picked up her prescription for compazine for nausea and has imodium available at home for diarrhea. Should she experience constipation, which is likely with frequent narcotic use, I suggested daily stool softeners and miralax. She is currently on cymbalta 30mg  daily to manage peripheral neuropathy symptoms. She plans to buzz cut her hair in the upcoming weeks and has already picked out a wig.  Demiana will return next week for for  re-evaluation and management of any adverse side effects from treatment. She understands and agrees with this plan. She  has been encouraged to call with any issues that might arise before her next visit here.   Susanne Borders L 12/18/2013, 1:17 PM

## 2013-12-19 ENCOUNTER — Telehealth: Payer: Self-pay | Admitting: Nurse Practitioner

## 2013-12-19 ENCOUNTER — Telehealth: Payer: Self-pay | Admitting: *Deleted

## 2013-12-19 NOTE — Telephone Encounter (Signed)
No adverse events from chemo. Eating well. Instructed to call for any side effects she can't manage or any questions.

## 2013-12-20 ENCOUNTER — Telehealth: Payer: Self-pay | Admitting: *Deleted

## 2013-12-20 NOTE — Telephone Encounter (Signed)
Called to follow up on her call to nurse line last night regarding facial rash. Female at residence said it has resolved and she is doing well. Out playing bridge w/friends. Appreciates the call.

## 2013-12-24 ENCOUNTER — Other Ambulatory Visit: Payer: Self-pay | Admitting: Nurse Practitioner

## 2013-12-25 ENCOUNTER — Encounter: Payer: Self-pay | Admitting: Adult Health

## 2013-12-25 ENCOUNTER — Ambulatory Visit (HOSPITAL_BASED_OUTPATIENT_CLINIC_OR_DEPARTMENT_OTHER): Payer: Medicare Other

## 2013-12-25 ENCOUNTER — Telehealth: Payer: Self-pay | Admitting: Adult Health

## 2013-12-25 ENCOUNTER — Other Ambulatory Visit (HOSPITAL_BASED_OUTPATIENT_CLINIC_OR_DEPARTMENT_OTHER): Payer: Medicare Other

## 2013-12-25 ENCOUNTER — Ambulatory Visit (HOSPITAL_BASED_OUTPATIENT_CLINIC_OR_DEPARTMENT_OTHER): Payer: Medicare Other | Admitting: Adult Health

## 2013-12-25 ENCOUNTER — Other Ambulatory Visit: Payer: Medicare Other

## 2013-12-25 VITALS — BP 109/60 | HR 98 | Temp 97.1°F | Resp 18 | Ht 62.0 in | Wt 171.2 lb

## 2013-12-25 DIAGNOSIS — C3492 Malignant neoplasm of unspecified part of left bronchus or lung: Secondary | ICD-10-CM

## 2013-12-25 DIAGNOSIS — J910 Malignant pleural effusion: Secondary | ICD-10-CM

## 2013-12-25 DIAGNOSIS — C3412 Malignant neoplasm of upper lobe, left bronchus or lung: Secondary | ICD-10-CM

## 2013-12-25 DIAGNOSIS — Z5111 Encounter for antineoplastic chemotherapy: Secondary | ICD-10-CM

## 2013-12-25 DIAGNOSIS — C771 Secondary and unspecified malignant neoplasm of intrathoracic lymph nodes: Secondary | ICD-10-CM

## 2013-12-25 LAB — COMPREHENSIVE METABOLIC PANEL (CC13)
ALT: 26 U/L (ref 0–55)
AST: 20 U/L (ref 5–34)
Albumin: 3.6 g/dL (ref 3.5–5.0)
Alkaline Phosphatase: 85 U/L (ref 40–150)
Anion Gap: 11 mEq/L (ref 3–11)
BUN: 28.3 mg/dL — ABNORMAL HIGH (ref 7.0–26.0)
CALCIUM: 10 mg/dL (ref 8.4–10.4)
CHLORIDE: 95 meq/L — AB (ref 98–109)
CO2: 29 meq/L (ref 22–29)
Creatinine: 1.2 mg/dL — ABNORMAL HIGH (ref 0.6–1.1)
Glucose: 118 mg/dl (ref 70–140)
Potassium: 3.8 mEq/L (ref 3.5–5.1)
SODIUM: 135 meq/L — AB (ref 136–145)
Total Bilirubin: 0.48 mg/dL (ref 0.20–1.20)
Total Protein: 6.8 g/dL (ref 6.4–8.3)

## 2013-12-25 LAB — CBC WITH DIFFERENTIAL/PLATELET
BASO%: 1 % (ref 0.0–2.0)
Basophils Absolute: 0.1 10*3/uL (ref 0.0–0.1)
EOS%: 2.2 % (ref 0.0–7.0)
Eosinophils Absolute: 0.2 10*3/uL (ref 0.0–0.5)
HCT: 37.9 % (ref 34.8–46.6)
HGB: 12.3 g/dL (ref 11.6–15.9)
LYMPH#: 1.3 10*3/uL (ref 0.9–3.3)
LYMPH%: 16.4 % (ref 14.0–49.7)
MCH: 28.7 pg (ref 25.1–34.0)
MCHC: 32.5 g/dL (ref 31.5–36.0)
MCV: 88.2 fL (ref 79.5–101.0)
MONO#: 0.4 10*3/uL (ref 0.1–0.9)
MONO%: 5.4 % (ref 0.0–14.0)
NEUT#: 5.8 10*3/uL (ref 1.5–6.5)
NEUT%: 75 % (ref 38.4–76.8)
Platelets: 238 10*3/uL (ref 145–400)
RBC: 4.3 10*6/uL (ref 3.70–5.45)
RDW: 13.7 % (ref 11.2–14.5)
WBC: 7.7 10*3/uL (ref 3.9–10.3)

## 2013-12-25 MED ORDER — ONDANSETRON 8 MG/50ML IVPB (CHCC)
8.0000 mg | Freq: Once | INTRAVENOUS | Status: AC
  Administered 2013-12-25: 8 mg via INTRAVENOUS

## 2013-12-25 MED ORDER — DEXAMETHASONE SODIUM PHOSPHATE 10 MG/ML IJ SOLN
INTRAMUSCULAR | Status: AC
  Filled 2013-12-25: qty 1

## 2013-12-25 MED ORDER — ONDANSETRON 8 MG/NS 50 ML IVPB
INTRAVENOUS | Status: AC
Start: 2013-12-25 — End: 2013-12-25
  Filled 2013-12-25: qty 8

## 2013-12-25 MED ORDER — HEPARIN SOD (PORK) LOCK FLUSH 100 UNIT/ML IV SOLN
500.0000 [IU] | Freq: Once | INTRAVENOUS | Status: AC | PRN
  Administered 2013-12-25: 500 [IU]
  Filled 2013-12-25: qty 5

## 2013-12-25 MED ORDER — SODIUM CHLORIDE 0.9 % IJ SOLN
10.0000 mL | INTRAMUSCULAR | Status: DC | PRN
  Administered 2013-12-25: 10 mL
  Filled 2013-12-25: qty 10

## 2013-12-25 MED ORDER — DEXAMETHASONE SODIUM PHOSPHATE 10 MG/ML IJ SOLN
10.0000 mg | Freq: Once | INTRAMUSCULAR | Status: AC
  Administered 2013-12-25: 10 mg via INTRAVENOUS

## 2013-12-25 MED ORDER — SODIUM CHLORIDE 0.9 % IV SOLN
Freq: Once | INTRAVENOUS | Status: AC
  Administered 2013-12-25: 12:00:00 via INTRAVENOUS

## 2013-12-25 MED ORDER — PACLITAXEL PROTEIN-BOUND CHEMO INJECTION 100 MG
100.0000 mg/m2 | Freq: Once | INTRAVENOUS | Status: AC
  Administered 2013-12-25: 175 mg via INTRAVENOUS
  Filled 2013-12-25: qty 35

## 2013-12-25 NOTE — Telephone Encounter (Signed)
, °

## 2013-12-25 NOTE — Progress Notes (Signed)
[place] [phone]   [phone] Thoracic oncology clinic CONSULT NOTE  REFERRING PHYSICIAN: Dr. Martin Majestic  REASON FOR CONSULTATION:  68 years old white female recently diagnosed with lung cancer  HPI Tammie Clay is a 68 y.o. female with past medical history significant for COPD, GERD, osteoporosis, dyslipidemia as well as long history of smoking but quit last year. The patient was seen by her primary care physician, Dr. Serita Grammes for routine evaluation. She had chest x-ray performed on 11/15/2013 and it showed moderate prominence of the left hilum was lobulated N border suspicious for left hilar mass/adenopathy. This was followed by CT scan of the chest on 11/19/2013 and it showed 4.0 cm central left upper lobe mass which involved the hilum and closest left upper lobe collapse. This is consistent with primary bronchogenic carcinoma. There was mediastinal lymphadenopathy consistent with metastatic disease and a small left pleural effusion. There was also indeterminate pulmonary nodules in the left lung base the largest measured 0.8 cm. The patient was referred to Dr. Servando Snare and a PET scan was performed on 11/28/2013. It showed central left lung mass which measured approximately 5.0 x 4.0 x 4.0 cm with maximum SUV of 13.9. The mass obstructs the left upper lobe airway and there was postoperative obstructive atelectasis of the left upper lobe. There was also evidence of pleural spread of tumor including the pleural-based nodule within the posterior and medial left lower lobe hemithorax with SUV max of 8.3 and the pleural nodule mass within the left hemithorax appears to involve both the visceral and parietal pleura. It measured 2.0 cm and has SUV max of 15.6. Hypermetabolic pre-vascular and sub- carinal lymph node metastasis identified. The sub- carinal lymph node measure 2.2 cm and has an SUV max equal to 13.7. Increased uptake in the area of the  anterior aspect of the left fourth rib is identified. This has an SUV max equal to 4.3. Given the absence of distant osseous metastasis this is likely felt to represent volume-averaging within adjacent pleural metastasis.  MRI of the brain on 11/27/2013 showed no acute intracranial process identified. On 12/03/2013 the patient underwent a video bronchoscopy with endobronchial ultrasound with biopsies under the care of Dr. Servando Snare. The final pathology (Accession: [id number]) was consistent with invasive squamous cell carcinoma.  INTERVAL HISTORY: Tammie Clay is here today for follow up after receiving her first cycle of Abraxane/Carboplatin.  She receives Abraxane, Carboplatin on day 1, Abraxane alone on days 8 and 15, of a 21 day cycle.  A total of 6 cycles are planned.    She is c/o fatigue and particularly days that she has no energy.  This has been going on all week.  She has lost the desire to do household duties that she typically does.  She is planning on hiring a cleaning service.    She does have mild occasional soreness in her legs.  She has lost one pound and is concerned about this becoming a pattern.  She started at 173 pounds and is now at 171.  Her appetite is described as diminished.  Sometimes she does feel too tired to eat.  She has missed meals on occasion.   She is concerned that she does have double vision.  This happened last Sunday at church, and also at last night while driving.  Of note, she did undergo an MRI of the brain on 11/29/13 that was negative for any brain metastases.   She did have constipation  and relieved this with Dulcolax which led to cramping, vomiting, and diarrhea, which resolved with Imodium.  This resolved yesterday.    She denies fevers, chills, numbness/tingling, skin/nail changes.    HPI  Past Medical History  Diagnosis Date  . Hypertension   . COPD (chronic obstructive pulmonary disease)   . Hyperlipidemia   . GERD (gastroesophageal reflux disease)    . Osteopenia   . Insomnia   . Mediastinal lymphadenopathy 11/19/13    PER CT  . Multiple lung nodules on CT 11/19/13    LEFT LUNG BASE  . Lung mass 11/19/13    RUL PER CT  . Coronary artery calcification seen on CAT scan 11/21/2013  . COPD (chronic obstructive pulmonary disease) 11/21/2013  . Pneumonia     hx of  . Anxiety     Past Surgical History  Procedure Laterality Date  .  c section x 2    . Urethral cyst removed    . Tonsillectomy    . Appendectomy    . Colonoscopy with propofol N/A 07/17/2012    Procedure: COLONOSCOPY WITH PROPOFOL;  Surgeon: Garlan Fair, MD;  Location: [facility];  Service: Endoscopy;  Laterality: N/A;  . Esophagogastroduodenoscopy (egd) with propofol N/A 07/17/2012    Procedure: ESOPHAGOGASTRODUODENOSCOPY (EGD) WITH PROPOFOL;  Surgeon: Garlan Fair, MD;  Location: [facility];  Service: Endoscopy;  Laterality: N/A;  . Video bronchoscopy with endobronchial ultrasound N/A 12/03/2013    Procedure: VIDEO BRONCHOSCOPY WITH ENDOBRONCHIAL ULTRASOUND WITH BIOPSIES;  Surgeon: Grace Isaac, MD;  Location: [place];  Service: Thoracic;  Laterality: N/A;  . Portacath placement Left 12/10/2013    Procedure: INSERTION PORT-A-CATH;  Surgeon: Grace Isaac, MD;  Location: [facility];  Service: Thoracic;  Laterality: Left;    Family History  Problem Relation Age of Onset  . Diabetes Father   . Heart disease Father     MI  . CVA Mother   . Cancer Cousin     LIPOSARCOMA  . Heart disease Cousin     HEART TRANSPLANTS X 2    Social History History  Substance Use Topics  . Smoking status: Former Smoker -- 0.50 packs/day for 50 years    Types: Cigarettes    Quit date: 10/08/2012  . Smokeless tobacco: Never Used     Comment: smokes Vapor daily  . Alcohol Use: Yes     Comment: social    No Known Allergies  Current Outpatient Prescriptions  Medication Sig Dispense Refill  . albuterol (PROVENTIL HFA;VENTOLIN HFA) 108 (90 BASE) MCG/ACT inhaler  Inhale 2 puffs into the lungs every 6 (six) hours as needed for wheezing.    Marland Kitchen aspirin 81 MG tablet Take 81 mg by mouth daily.    Marland Kitchen atorvastatin (LIPITOR) 40 MG tablet Take 40 mg by mouth daily.    . Calcium Carbonate-Vitamin D (CALCIUM-D) 600-400 MG-UNIT TABS Take 1 tablet by mouth 2 (two) times daily.    . diazepam (VALIUM) 5 MG tablet Take 5 mg by mouth at bedtime.    . DULoxetine (CYMBALTA) 30 MG capsule Take 1 capsule (30 mg total) by mouth daily. 30 capsule 2  . HYDROcodone-acetaminophen (NORCO/VICODIN) 5-325 MG per tablet Take 1-2 tablets by mouth every 6 (six) hours as needed for moderate pain.    Marland Kitchen lidocaine-prilocaine (EMLA) cream Apply 1 application topically as needed. 30 g 0  . lisinopril-hydrochlorothiazide (PRINZIDE,ZESTORETIC) 20-12.5 MG per tablet Take 2 tablets by mouth every morning.     . metoprolol succinate (TOPROL-XL)  50 MG 24 hr tablet Take 50 mg by mouth every morning. Take with or immediately following a meal.    . Multiple Vitamin (MULTIVITAMIN WITH MINERALS) TABS Take 1 tablet by mouth daily.    . Omega-3 Fatty Acids (OMEGA-3 FISH OIL PO) Take 1 capsule by mouth daily.    . prochlorperazine (COMPAZINE) 10 MG tablet Take 1 tablet (10 mg total) by mouth every 6 (six) hours as needed for nausea or vomiting. 30 tablet 0  . tiotropium (SPIRIVA) 18 MCG inhalation capsule Place 18 mcg into inhaler and inhale daily.    Marland Kitchen VITAMIN E PO Take 1 capsule by mouth daily.     No current facility-administered medications for this visit.    Review of Systems  A 10 point review of systems was conducted and is otherwise negative except for what is noted above.     Physical Exam BP 109/60 mmHg  Pulse 98  Temp(Src) 97.1 F (36.2 C) (Oral)  Resp 18  Ht 5\' 2"  (1.575 m)  Wt 171 lb 3.2 oz (77.656 kg)  BMI 31.31 kg/m2 GENERAL: Patient is a well appearing female in no acute distress HEENT:  Sclerae anicteric.  Oropharynx clear and moist. No ulcerations or evidence of oropharyngeal  candidiasis. Neck is supple.  NODES:  No cervical, supraclavicular, or axillary lymphadenopathy palpated.  LUNGS: mild wheezing in bases HEART:  Regular rate and rhythm. No murmur appreciated. ABDOMEN:  Soft, nontender.  Positive, normoactive bowel sounds. No organomegaly palpated. MSK:  No focal spinal tenderness to palpation. Full range of motion bilaterally in the upper extremities. EXTREMITIES:  No peripheral edema.   SKIN:  Clear with no obvious rashes or skin changes. No nail dyscrasia. NEURO:  Nonfocal. Well oriented.  Appropriate affect. PERFORMANCE STATUS: ECOG 1  LABORATORY DATA: Lab Results  Component Value Date   WBC 7.7 12/25/2013   HGB 12.3 12/25/2013   HCT 37.9 12/25/2013   MCV 88.2 12/25/2013   PLT 238 12/25/2013      Chemistry      Component Value Date/Time   NA 140 12/18/2013 1037   NA 138 12/10/2013 0637   K 3.8 12/18/2013 1037   K 4.7 12/10/2013 0637   CL 99 12/10/2013 0637   CO2 31* 12/18/2013 1037   CO2 28 12/10/2013 0637   BUN 20.5 12/18/2013 1037   BUN 22 12/10/2013 0637   CREATININE 0.8 12/18/2013 1037   CREATININE 0.75 12/10/2013 0637   CREATININE 0.80 11/22/2013 1054      Component Value Date/Time   CALCIUM 9.9 12/18/2013 1037   CALCIUM 9.8 12/10/2013 0637   ALKPHOS 87 12/18/2013 1037   ALKPHOS 77 12/10/2013 0637   AST 15 12/18/2013 1037   AST 16 12/10/2013 0637   ALT 17 12/18/2013 1037   ALT 16 12/10/2013 0637   BILITOT 0.38 12/18/2013 1037   BILITOT 0.4 12/10/2013 0637       RADIOGRAPHIC STUDIES: Dg Chest 2 View Within Previous 72 Hours.  Films Obtained On Friday Are Acceptable For Monday And Tuesday Cases  11/29/2013   CLINICAL DATA:  Left upper lobe mass.  Patient for VATS.  EXAM: CHEST  2 VIEW  COMPARISON:  CT chest 11/28/2013.  PA and lateral chest 11/15/2013.  FINDINGS: Opacity projected in the left hilum correlates with left upper lobe mass seen on the prior studies. Calcified granuloma in the right upper lobe is noted. The  lungs are otherwise clear. Heart size is normal. No pneumothorax or pleural effusion. No focal bony abnormality.  IMPRESSION: No acute finding in patient with a left upper lobe mass.   Electronically Signed   By: Inge Rise M.D.   On: 11/29/2013 14:21   Dg Chest 2 View  11/15/2013   CLINICAL DATA:  Left upper quadrant pain with chest discomfort. COPD.  EXAM: CHEST  2 VIEW  COMPARISON:  03/29/2012 and 03/17/2006  FINDINGS: Lungs are hypoinflated and demonstrate increased density with somewhat sharp a lobular lateral border over the left hilar region. Couple small stable right perihilar nodules likely calcified granulomas unchanged from 2008. No evidence of pleural effusion. Cardiac silhouette is within normal. There is mild calcified plaque over the thoracic aorta. There are mild degenerative changes of the spine.  IMPRESSION: Moderate prominence of the left hilum with lobulated lateral border suspicious for left hilar mass/adenopathy. Recommend contrast-enhanced chest CT for further evaluation.   Electronically Signed   By: Marin Olp M.D.   On: 11/15/2013 15:20   Ct Chest W Contrast  11/19/2013   CLINICAL DATA:  Left hilar mass on chest radiograph. Left upper quadrant pain.  EXAM: CT CHEST WITH CONTRAST  TECHNIQUE: Multidetector CT imaging of the chest was performed during intravenous contrast administration.  CONTRAST:  5mL OMNIPAQUE IOHEXOL 300 MG/ML  SOLN  COMPARISON:  Chest radiograph on 11/15/2013  FINDINGS: Mediastinum/Hilar Regions: Mediastinal lymphadenopathy is seen in the lateral aortic region measuring 1.6 x 4.3 cm on image 20. Sub- carina lymphadenopathy seen measuring 2.1 cm on image 26.  Other Thoracic Lymphadenopathy:  None.  Lungs: Left upper lobe collapse is seen with a central low-attenuation mass measuring 3.9 x 4.2 cm. This is contiguous with the left hilum obstructs the central left upper lobe bronchus.  In addition, there is an 8 mm indeterminate nodular density in the  anterior left lung base on image 38. There are also several subpleural pulmonary nodular densities in the left lower lobe, largest measuring 7 mm on image 44.  Pleura:  Small left pleural effusion noted.  Vascular/Cardiac:  No acute findings identified.  Musculoskeletal:  No suspicious bone lesions identified.  Other:  None.  IMPRESSION: 4 cm central left upper lobe mass which involves the hilum and causes left upper lobe collapse. This is consistent with primary bronchogenic carcinoma. Consider bronchoscopy for tissue diagnosis.  Mediastinal lymphadenopathy, consistent with metastatic disease.  Small left pleural effusion.  Indeterminate pulmonary nodules in the left lung base, largest measuring 8 mm. PET-CT scan should be considered for further evaluation.   Electronically Signed   By: Earle Gell M.D.   On: 11/19/2013 11:41   Mr Jeri Cos DQ Contrast  11/30/2013   CLINICAL DATA:  History of left upper lobe lung lesion and COPD.  EXAM: MRI HEAD WITHOUT AND WITH CONTRAST  TECHNIQUE: Multiplanar, multiecho pulse sequences of the brain and surrounding structures were obtained without and with intravenous contrast.  CONTRAST:  90mL MULTIHANCE GADOBENATE DIMEGLUMINE 529 MG/ML IV SOLN  COMPARISON:  None.  FINDINGS: Mild diffuse prominence of the CSF containing spaces compatible with generalized atrophy. Scattered and confluent T2/FLAIR hyperintensity within the periventricular and deep white matter are noted, nonspecific, but likely related to mild chronic small vessel ischemic changes.  No focal parenchymal signal abnormality is identified. No mass lesion, midline shift, or extra-axial fluid collection. Ventricles are normal in size without evidence of hydrocephalus. No abnormal enhancement.  No diffusion-weighted signal abnormality is identified to suggest acute intracranial infarct. Gray-white matter differentiation is maintained. Normal flow voids are seen within the intracranial vasculature. No intracranial  hemorrhage identified.  The cervicomedullary junction is normal. Pituitary gland is within normal limits. Pituitary stalk is midline. The globes and optic nerves demonstrate a normal appearance with normal signal intensity. The  The bone marrow signal intensity is normal. Calvarium is intact. Visualized upper cervical spine is within normal limits.  Scalp soft tissues are unremarkable.  Paranasal sinuses are clear.  No mastoid effusion.  IMPRESSION: 1. No acute intracranial process identified. Specifically, no mass lesion or evidence of intracranial metastasis. 2. Mild age-related atrophy with chronic small vessel ischemic disease.   Electronically Signed   By: Jeannine Boga M.D.   On: 11/30/2013 06:37   Nm Pet Image Initial (pi) Skull Base To Thigh  11/28/2013   CLINICAL DATA:  Initial treatment strategy for Lung cancer.  EXAM: NUCLEAR MEDICINE PET SKULL BASE TO THIGH  TECHNIQUE: 9.3 mCi F-18 FDG was injected intravenously. Full-ring PET imaging was performed from the skull base to thigh after the radiotracer. CT data was obtained and used for attenuation correction and anatomic localization.  FASTING BLOOD GLUCOSE:  Value: 93 mg/dl  COMPARISON:  None.  FINDINGS: NECK  No hypermetabolic lymph nodes in the neck.  CHEST  Central left lung mass is identified. This measure approximately 5 x 4 x 4 cm. The SUV max associated with this mass is equal to 13.9 cm. The mass obstructs the left upper lobe airway and there is postobstructive atelectasis of the left upper lobe. A small left pleural effusion is identified. Evidence of pleural spread of tumor is identified. Pleural base nodule within the posterior and medial left lower left hemi thorax has an SUV max equal to 8.3. Pleural nodule mass within the left hemi thorax appears to involve both the visceral and parietal pleura. This measures approximately 2 cm and has an SUV max equal to 15.6. Calcified granulomas identified in both lungs. There are moderate  changes of centrilobular emphysema.  Hypermetabolic pre-vascular and sub- carinal lymph node metastasis identified. The sub- carinal lymph node measure 2.2 cm and has an SUV max equal to 13.7.  ABDOMEN/PELVIS  No abnormal hypermetabolic activity within the liver, pancreas, adrenal glands, or spleen. No hypermetabolic lymph nodes in the abdomen or pelvis. A cyst within the left adnexa measures 5.2 cm  SKELETON  Increased uptake in the area of the anterior aspect of the left fourth rib is identified. This has an SUV max equal to 4.3. Given the absence of distant osseous metastasis this is likely felt to represent volume-averaging within adjacent pleural metastasis.  IMPRESSION: 1. Central, invasive left upper lobe lung mass with associated postobstructive atelectasis is identified and is concerning for primary bronchogenic carcinoma. There is ipsilateral and sub- carinal lymph node metastasis as well as trans pleural spread of tumor within the left hemi thorax. Assuming non-small cell lung cancer this would be considered T3N2M1a or Stage 4 disease.   Electronically Signed   By: Kerby Moors M.D.   On: 11/28/2013 09:30    ASSESSMENT: This is a very pleasant 17 his old white female recently diagnosed with stage IV (T2, N2, M1a) non-small cell lung cancer, squamous cell carcinoma presented with central left upper lobe lung mass with associated postobstructive atelectasis as well as ipsilateral and subcarinal lymphadenopathy as well as pleural tumor spread diagnosed in October 2015.  Tammie Clay started Abraxane carboplatin on 12/18/13.  She receives Abraxane/Carboplatin on day 1 and abraxane alone on days 8 and 15 of a 21 day cycle.    PLAN: Tammie Clay is doing well today.  Her CBC is normal and I reviewed this with her in detail.  A CMP is pending.  She will proceed with scheduled Abraxane today.    She is concerned about weight loss.  I reviewed nutritional supplements with her that she can try.  I also contacted  Dory Peru our nutritionist to attempt to get her some samples of products.    I reviewed the double vision with Dr. [name].  This is likely not due to the chemotherapy/cancer.  She will continue to monitor for these symptoms.    Tammie Clay will return in one week for abraxane, and in 2 weeks for labs, an office visit and cycle 2 day 1 Carboplatin/Abraxane.     She understands and agrees with this plan. She has been encouraged to call with any issues that might arise before her next visit here.   I spent 25 minutes counseling the patient face to face.  The total time spent in the appointment was 30 minutes.   Minette Headland, [place] [phone] 12/25/2013, 11:02 AM

## 2013-12-25 NOTE — Patient Instructions (Signed)
[facility] Discharge Instructions for Patients Receiving Chemotherapy  Today you received the following chemotherapy agent Abraxane.  To help prevent nausea and vomiting after your treatment, we encourage you to take your nausea medication.   If you develop nausea and vomiting that is not controlled by your nausea medication, call the clinic.   BELOW ARE SYMPTOMS THAT SHOULD BE REPORTED IMMEDIATELY:  *FEVER GREATER THAN 100.5 F  *CHILLS WITH OR WITHOUT FEVER  NAUSEA AND VOMITING THAT IS NOT CONTROLLED WITH YOUR NAUSEA MEDICATION  *UNUSUAL SHORTNESS OF BREATH  *UNUSUAL BRUISING OR BLEEDING  TENDERNESS IN MOUTH AND THROAT WITH OR WITHOUT PRESENCE OF ULCERS  *URINARY PROBLEMS  *BOWEL PROBLEMS  UNUSUAL RASH Items with * indicate a potential emergency and should be followed up as soon as possible.  Feel free to call the clinic you have any questions or concerns. The clinic phone number is [phone].

## 2014-01-01 ENCOUNTER — Ambulatory Visit (HOSPITAL_BASED_OUTPATIENT_CLINIC_OR_DEPARTMENT_OTHER): Payer: Medicare Other

## 2014-01-01 ENCOUNTER — Other Ambulatory Visit (HOSPITAL_BASED_OUTPATIENT_CLINIC_OR_DEPARTMENT_OTHER): Payer: Medicare Other

## 2014-01-01 DIAGNOSIS — C3492 Malignant neoplasm of unspecified part of left bronchus or lung: Secondary | ICD-10-CM

## 2014-01-01 DIAGNOSIS — C3412 Malignant neoplasm of upper lobe, left bronchus or lung: Secondary | ICD-10-CM

## 2014-01-01 DIAGNOSIS — Z5111 Encounter for antineoplastic chemotherapy: Secondary | ICD-10-CM

## 2014-01-01 LAB — COMPREHENSIVE METABOLIC PANEL (CC13)
ALK PHOS: 75 U/L (ref 40–150)
ALT: 21 U/L (ref 0–55)
AST: 16 U/L (ref 5–34)
Albumin: 3.5 g/dL (ref 3.5–5.0)
Anion Gap: 9 mEq/L (ref 3–11)
BILIRUBIN TOTAL: 0.24 mg/dL (ref 0.20–1.20)
BUN: 17.1 mg/dL (ref 7.0–26.0)
CO2: 27 mEq/L (ref 22–29)
CREATININE: 0.8 mg/dL (ref 0.6–1.1)
Calcium: 9.2 mg/dL (ref 8.4–10.4)
Chloride: 102 mEq/L (ref 98–109)
Glucose: 96 mg/dl (ref 70–140)
Potassium: 4.1 mEq/L (ref 3.5–5.1)
Sodium: 138 mEq/L (ref 136–145)
Total Protein: 6.5 g/dL (ref 6.4–8.3)

## 2014-01-01 LAB — CBC WITH DIFFERENTIAL/PLATELET
BASO%: 1 % (ref 0.0–2.0)
BASOS ABS: 0 10*3/uL (ref 0.0–0.1)
EOS%: 1.3 % (ref 0.0–7.0)
Eosinophils Absolute: 0 10*3/uL (ref 0.0–0.5)
HEMATOCRIT: 36.3 % (ref 34.8–46.6)
HGB: 11.8 g/dL (ref 11.6–15.9)
LYMPH%: 30.3 % (ref 14.0–49.7)
MCH: 29.1 pg (ref 25.1–34.0)
MCHC: 32.4 g/dL (ref 31.5–36.0)
MCV: 89.8 fL (ref 79.5–101.0)
MONO#: 0.3 10*3/uL (ref 0.1–0.9)
MONO%: 9 % (ref 0.0–14.0)
NEUT#: 2.1 10*3/uL (ref 1.5–6.5)
NEUT%: 58.4 % (ref 38.4–76.8)
Platelets: 227 10*3/uL (ref 145–400)
RBC: 4.04 10*6/uL (ref 3.70–5.45)
RDW: 14 % (ref 11.2–14.5)
WBC: 3.6 10*3/uL — ABNORMAL LOW (ref 3.9–10.3)
lymph#: 1.1 10*3/uL (ref 0.9–3.3)

## 2014-01-01 MED ORDER — PACLITAXEL PROTEIN-BOUND CHEMO INJECTION 100 MG
100.0000 mg/m2 | Freq: Once | INTRAVENOUS | Status: AC
  Administered 2014-01-01: 175 mg via INTRAVENOUS
  Filled 2014-01-01: qty 35

## 2014-01-01 MED ORDER — DEXAMETHASONE SODIUM PHOSPHATE 10 MG/ML IJ SOLN
INTRAMUSCULAR | Status: AC
  Filled 2014-01-01: qty 1

## 2014-01-01 MED ORDER — ONDANSETRON 8 MG/NS 50 ML IVPB
INTRAVENOUS | Status: AC
  Filled 2014-01-01: qty 8

## 2014-01-01 MED ORDER — SODIUM CHLORIDE 0.9 % IV SOLN
Freq: Once | INTRAVENOUS | Status: AC
  Administered 2014-01-01: 12:00:00 via INTRAVENOUS

## 2014-01-01 MED ORDER — ONDANSETRON 8 MG/50ML IVPB (CHCC)
8.0000 mg | Freq: Once | INTRAVENOUS | Status: AC
  Administered 2014-01-01: 8 mg via INTRAVENOUS

## 2014-01-01 MED ORDER — HEPARIN SOD (PORK) LOCK FLUSH 100 UNIT/ML IV SOLN
500.0000 [IU] | Freq: Once | INTRAVENOUS | Status: AC | PRN
  Administered 2014-01-01: 500 [IU]
  Filled 2014-01-01: qty 5

## 2014-01-01 MED ORDER — SODIUM CHLORIDE 0.9 % IJ SOLN
10.0000 mL | INTRAMUSCULAR | Status: DC | PRN
Start: 2014-01-01 — End: 2014-01-01
  Administered 2014-01-01: 10 mL
  Filled 2014-01-01: qty 10

## 2014-01-01 MED ORDER — DEXAMETHASONE SODIUM PHOSPHATE 10 MG/ML IJ SOLN
10.0000 mg | Freq: Once | INTRAMUSCULAR | Status: AC
  Administered 2014-01-01: 10 mg via INTRAVENOUS

## 2014-01-01 NOTE — Patient Instructions (Signed)
[facility] Discharge Instructions for Patients Receiving Chemotherapy  Today you received the following chemotherapy agents Abraxane  To help prevent nausea and vomiting after your treatment, we encourage you to take your nausea medication as prescribed.   If you develop nausea and vomiting that is not controlled by your nausea medication, call the clinic.   BELOW ARE SYMPTOMS THAT SHOULD BE REPORTED IMMEDIATELY:  *FEVER GREATER THAN 100.5 F  *CHILLS WITH OR WITHOUT FEVER  NAUSEA AND VOMITING THAT IS NOT CONTROLLED WITH YOUR NAUSEA MEDICATION  *UNUSUAL SHORTNESS OF BREATH  *UNUSUAL BRUISING OR BLEEDING  TENDERNESS IN MOUTH AND THROAT WITH OR WITHOUT PRESENCE OF ULCERS  *URINARY PROBLEMS  *BOWEL PROBLEMS  UNUSUAL RASH Items with * indicate a potential emergency and should be followed up as soon as possible.  Feel free to call the clinic you have any questions or concerns. The clinic phone number is [phone].

## 2014-01-06 ENCOUNTER — Other Ambulatory Visit: Payer: Medicare Other

## 2014-01-06 ENCOUNTER — Ambulatory Visit: Payer: Medicare Other | Admitting: Physician Assistant

## 2014-01-08 ENCOUNTER — Ambulatory Visit (HOSPITAL_BASED_OUTPATIENT_CLINIC_OR_DEPARTMENT_OTHER): Payer: Medicare Other

## 2014-01-08 ENCOUNTER — Ambulatory Visit (HOSPITAL_BASED_OUTPATIENT_CLINIC_OR_DEPARTMENT_OTHER): Payer: Medicare Other | Admitting: Nurse Practitioner

## 2014-01-08 ENCOUNTER — Other Ambulatory Visit (HOSPITAL_BASED_OUTPATIENT_CLINIC_OR_DEPARTMENT_OTHER): Payer: Medicare Other

## 2014-01-08 ENCOUNTER — Telehealth: Payer: Self-pay | Admitting: Nurse Practitioner

## 2014-01-08 VITALS — BP 135/83 | HR 94 | Temp 97.6°F | Resp 18 | Ht 62.0 in | Wt 172.7 lb

## 2014-01-08 DIAGNOSIS — C782 Secondary malignant neoplasm of pleura: Secondary | ICD-10-CM

## 2014-01-08 DIAGNOSIS — C3412 Malignant neoplasm of upper lobe, left bronchus or lung: Secondary | ICD-10-CM

## 2014-01-08 DIAGNOSIS — Z5111 Encounter for antineoplastic chemotherapy: Secondary | ICD-10-CM

## 2014-01-08 DIAGNOSIS — C3492 Malignant neoplasm of unspecified part of left bronchus or lung: Secondary | ICD-10-CM

## 2014-01-08 LAB — COMPREHENSIVE METABOLIC PANEL (CC13)
ALBUMIN: 3.7 g/dL (ref 3.5–5.0)
ALT: 22 U/L (ref 0–55)
AST: 16 U/L (ref 5–34)
Alkaline Phosphatase: 88 U/L (ref 40–150)
Anion Gap: 10 mEq/L (ref 3–11)
BUN: 18.1 mg/dL (ref 7.0–26.0)
CO2: 28 mEq/L (ref 22–29)
Calcium: 9.4 mg/dL (ref 8.4–10.4)
Chloride: 101 mEq/L (ref 98–109)
Creatinine: 0.8 mg/dL (ref 0.6–1.1)
GLUCOSE: 91 mg/dL (ref 70–140)
POTASSIUM: 4.3 meq/L (ref 3.5–5.1)
SODIUM: 138 meq/L (ref 136–145)
TOTAL PROTEIN: 6.9 g/dL (ref 6.4–8.3)
Total Bilirubin: 0.23 mg/dL (ref 0.20–1.20)

## 2014-01-08 LAB — CBC WITH DIFFERENTIAL/PLATELET
BASO%: 0.7 % (ref 0.0–2.0)
Basophils Absolute: 0 10*3/uL (ref 0.0–0.1)
EOS ABS: 0.1 10*3/uL (ref 0.0–0.5)
EOS%: 1.2 % (ref 0.0–7.0)
HCT: 37.3 % (ref 34.8–46.6)
HGB: 12.5 g/dL (ref 11.6–15.9)
LYMPH%: 31 % (ref 14.0–49.7)
MCH: 29.7 pg (ref 25.1–34.0)
MCHC: 33.5 g/dL (ref 31.5–36.0)
MCV: 88.6 fL (ref 79.5–101.0)
MONO#: 0.4 10*3/uL (ref 0.1–0.9)
MONO%: 9.4 % (ref 0.0–14.0)
NEUT%: 57.7 % (ref 38.4–76.8)
NEUTROS ABS: 2.4 10*3/uL (ref 1.5–6.5)
Platelets: 173 10*3/uL (ref 145–400)
RBC: 4.21 10*6/uL (ref 3.70–5.45)
RDW: 14.6 % — AB (ref 11.2–14.5)
WBC: 4.2 10*3/uL (ref 3.9–10.3)
lymph#: 1.3 10*3/uL (ref 0.9–3.3)

## 2014-01-08 MED ORDER — ONDANSETRON 16 MG/50ML IVPB (CHCC)
16.0000 mg | Freq: Once | INTRAVENOUS | Status: AC
  Administered 2014-01-08: 16 mg via INTRAVENOUS

## 2014-01-08 MED ORDER — PACLITAXEL PROTEIN-BOUND CHEMO INJECTION 100 MG
100.0000 mg/m2 | Freq: Once | INTRAVENOUS | Status: AC
  Administered 2014-01-08: 175 mg via INTRAVENOUS
  Filled 2014-01-08: qty 35

## 2014-01-08 MED ORDER — ONDANSETRON 16 MG/50ML IVPB (CHCC)
INTRAVENOUS | Status: AC
  Filled 2014-01-08: qty 16

## 2014-01-08 MED ORDER — DEXAMETHASONE SODIUM PHOSPHATE 20 MG/5ML IJ SOLN
INTRAMUSCULAR | Status: AC
  Filled 2014-01-08: qty 5

## 2014-01-08 MED ORDER — DEXAMETHASONE SODIUM PHOSPHATE 20 MG/5ML IJ SOLN
20.0000 mg | Freq: Once | INTRAMUSCULAR | Status: AC
  Administered 2014-01-08: 20 mg via INTRAVENOUS

## 2014-01-08 MED ORDER — SODIUM CHLORIDE 0.9 % IV SOLN
Freq: Once | INTRAVENOUS | Status: AC
  Administered 2014-01-08: 14:00:00 via INTRAVENOUS

## 2014-01-08 MED ORDER — SODIUM CHLORIDE 0.9 % IV SOLN
450.0000 mg | Freq: Once | INTRAVENOUS | Status: AC
  Administered 2014-01-08: 450 mg via INTRAVENOUS
  Filled 2014-01-08: qty 45

## 2014-01-08 MED ORDER — HEPARIN SOD (PORK) LOCK FLUSH 100 UNIT/ML IV SOLN
500.0000 [IU] | Freq: Once | INTRAVENOUS | Status: AC | PRN
  Administered 2014-01-08: 500 [IU]
  Filled 2014-01-08: qty 5

## 2014-01-08 MED ORDER — SODIUM CHLORIDE 0.9 % IJ SOLN
10.0000 mL | INTRAMUSCULAR | Status: DC | PRN
  Administered 2014-01-08: 10 mL
  Filled 2014-01-08: qty 10

## 2014-01-08 NOTE — Telephone Encounter (Signed)
Pt confirmed labs/ov per 12/02 POF, gave pt AVS..... KJ

## 2014-01-08 NOTE — Patient Instructions (Signed)
[facility] Discharge Instructions for Patients Receiving Chemotherapy  Today you received the following chemotherapy agents: Abraxane and Carboplatin.  To help prevent nausea and vomiting after your treatment, we encourage you to take your nausea medication as prescribed.   If you develop nausea and vomiting that is not controlled by your nausea medication, call the clinic.   BELOW ARE SYMPTOMS THAT SHOULD BE REPORTED IMMEDIATELY:  *FEVER GREATER THAN 100.5 F  *CHILLS WITH OR WITHOUT FEVER  NAUSEA AND VOMITING THAT IS NOT CONTROLLED WITH YOUR NAUSEA MEDICATION  *UNUSUAL SHORTNESS OF BREATH  *UNUSUAL BRUISING OR BLEEDING  TENDERNESS IN MOUTH AND THROAT WITH OR WITHOUT PRESENCE OF ULCERS  *URINARY PROBLEMS  *BOWEL PROBLEMS  UNUSUAL RASH Items with * indicate a potential emergency and should be followed up as soon as possible.  Feel free to call the clinic you have any questions or concerns. The clinic phone number is [phone].

## 2014-01-08 NOTE — Progress Notes (Signed)
  [facility] OFFICE PROGRESS NOTE   Diagnosis:  Stage IV (T2, N2, M1a) non-small cell lung cancer, squamous cell carcinoma  INTERVAL HISTORY:   Tammie Clay returns as scheduled. She completed cycle 1 carboplatin/Abraxane beginning 12/18/2013. She feels she is tolerating the chemotherapy well. She denies nausea/vomiting. No mouth sores. No diarrhea. No numbness or tingling in her hands or feet. No skin rash. She has a good appetite. She denies pain. She states she is "tired". She is having some difficulty sleeping. She takes 10 mg of Valium at bedtime and tends to wake up during the middle of the night. She has stable dyspnea on exertion. No cough or fever. She continues to intermittently note double vision. This mainly occurs at nighttime while driving. She sees double lines on the road. She occasionally notes the double vision during daytime hours. She also notes that her vision is blurry at nighttime. The eye symptoms do not occur on a daily basis. She denies any unusual headaches. No falls or balance problems.  Objective:  Vital signs in last 24 hours:  Blood pressure 135/83, pulse 94, temperature 97.6 F (36.4 C), temperature source Oral, resp. rate 18, height 5\' 2"  (1.575 m), weight 172 lb 11.2 oz (78.336 kg).    HEENT: no thrush or ulcers. Extraocular movements intact. Resp: distant breath sounds. Lungs are clear. Cardio: regular rate and rhythm. GI: abdomen soft and nontender. No hepatomegaly. Vascular: no leg edema. Calves soft and nontender. Neuro: alert and oriented. Motor strength 5 over 5. Gait normal. Skin: no rash. Port-A-Cath without erythema.    Lab Results:  Lab Results  Component Value Date   WBC 4.2 01/08/2014   HGB 12.5 01/08/2014   HCT 37.3 01/08/2014   MCV 88.6 01/08/2014   PLT 173 01/08/2014   NEUTROABS 2.4 01/08/2014    Imaging:  No results found.  Medications: I have reviewed the patient's current  medications.  Assessment/Plan: 1. Stage IV (T2, N2, M1a) non-small cell lung cancer, squamous cell carcinoma, presenting with a central left upper lobe lung mass with associated postobstructive atelectasis as well as ipsilateral and subcarinal lymphadenopathy and pleural tumor spread diagnosed October 2015. Status post cycle 1 carboplatin/Abraxane beginning 12/18/2013 with carboplatin day 1 and Abraxane on a day 1, day 8 and day 15 schedule of a 21 day cycle. 2. Intermittent ? Double vision. Negative MRI of the brain 11/29/2013.   Disposition: Tammie Clay appears stable. She tolerated the first cycle of carboplatin/Abraxane well. Plan to proceed with cycle 2 today as scheduled.  I discussed her report of diplopia with Dr. [name]. She will followup with her eye doctor.  She will return for the day 8 Abraxane on 01/15/2014. She will return for a followup visit and day 15 Abraxane on 01/22/2014. She will contact the office in the interim with any problems.    [name] ANP/GNP-BC   01/08/2014  1:32 PM

## 2014-01-15 ENCOUNTER — Ambulatory Visit (HOSPITAL_BASED_OUTPATIENT_CLINIC_OR_DEPARTMENT_OTHER): Payer: Medicare Other

## 2014-01-15 ENCOUNTER — Other Ambulatory Visit (HOSPITAL_BASED_OUTPATIENT_CLINIC_OR_DEPARTMENT_OTHER): Payer: Medicare Other

## 2014-01-15 DIAGNOSIS — C3492 Malignant neoplasm of unspecified part of left bronchus or lung: Secondary | ICD-10-CM

## 2014-01-15 DIAGNOSIS — C782 Secondary malignant neoplasm of pleura: Secondary | ICD-10-CM

## 2014-01-15 DIAGNOSIS — C3412 Malignant neoplasm of upper lobe, left bronchus or lung: Secondary | ICD-10-CM

## 2014-01-15 DIAGNOSIS — Z5111 Encounter for antineoplastic chemotherapy: Secondary | ICD-10-CM

## 2014-01-15 LAB — CBC WITH DIFFERENTIAL/PLATELET
BASO%: 0.8 % (ref 0.0–2.0)
BASOS ABS: 0 10*3/uL (ref 0.0–0.1)
EOS%: 0.5 % (ref 0.0–7.0)
Eosinophils Absolute: 0 10*3/uL (ref 0.0–0.5)
HEMATOCRIT: 37.5 % (ref 34.8–46.6)
HEMOGLOBIN: 12.2 g/dL (ref 11.6–15.9)
LYMPH%: 23.6 % (ref 14.0–49.7)
MCH: 29.3 pg (ref 25.1–34.0)
MCHC: 32.5 g/dL (ref 31.5–36.0)
MCV: 90.3 fL (ref 79.5–101.0)
MONO#: 0.2 10*3/uL (ref 0.1–0.9)
MONO%: 5.3 % (ref 0.0–14.0)
NEUT#: 3.2 10*3/uL (ref 1.5–6.5)
NEUT%: 69.8 % (ref 38.4–76.8)
PLATELETS: 188 10*3/uL (ref 145–400)
RBC: 4.16 10*6/uL (ref 3.70–5.45)
RDW: 14.4 % (ref 11.2–14.5)
WBC: 4.6 10*3/uL (ref 3.9–10.3)
lymph#: 1.1 10*3/uL (ref 0.9–3.3)

## 2014-01-15 LAB — COMPREHENSIVE METABOLIC PANEL (CC13)
ALK PHOS: 78 U/L (ref 40–150)
ALT: 24 U/L (ref 0–55)
AST: 19 U/L (ref 5–34)
Albumin: 3.8 g/dL (ref 3.5–5.0)
Anion Gap: 11 mEq/L (ref 3–11)
BILIRUBIN TOTAL: 0.59 mg/dL (ref 0.20–1.20)
BUN: 22.8 mg/dL (ref 7.0–26.0)
CALCIUM: 9.7 mg/dL (ref 8.4–10.4)
CHLORIDE: 100 meq/L (ref 98–109)
CO2: 27 mEq/L (ref 22–29)
CREATININE: 1 mg/dL (ref 0.6–1.1)
EGFR: 59 mL/min/{1.73_m2} — ABNORMAL LOW (ref >90)
Glucose: 105 mg/dl (ref 70–140)
Potassium: 4.1 mEq/L (ref 3.5–5.1)
Sodium: 139 mEq/L (ref 136–145)
Total Protein: 6.8 g/dL (ref 6.4–8.3)

## 2014-01-15 MED ORDER — DEXAMETHASONE SODIUM PHOSPHATE 10 MG/ML IJ SOLN
10.0000 mg | Freq: Once | INTRAMUSCULAR | Status: AC
  Administered 2014-01-15: 10 mg via INTRAVENOUS

## 2014-01-15 MED ORDER — SODIUM CHLORIDE 0.9 % IV SOLN
Freq: Once | INTRAVENOUS | Status: AC
  Administered 2014-01-15: 12:00:00 via INTRAVENOUS

## 2014-01-15 MED ORDER — PACLITAXEL PROTEIN-BOUND CHEMO INJECTION 100 MG
100.0000 mg/m2 | Freq: Once | INTRAVENOUS | Status: AC
  Administered 2014-01-15: 175 mg via INTRAVENOUS
  Filled 2014-01-15: qty 35

## 2014-01-15 MED ORDER — DEXAMETHASONE SODIUM PHOSPHATE 10 MG/ML IJ SOLN
INTRAMUSCULAR | Status: AC
Start: 2014-01-15 — End: 2014-01-15
  Filled 2014-01-15: qty 1

## 2014-01-15 MED ORDER — SODIUM CHLORIDE 0.9 % IJ SOLN
10.0000 mL | INTRAMUSCULAR | Status: DC | PRN
  Administered 2014-01-15: 10 mL
  Filled 2014-01-15: qty 10

## 2014-01-15 MED ORDER — ONDANSETRON 8 MG/NS 50 ML IVPB
INTRAVENOUS | Status: AC
Start: 2014-01-15 — End: 2014-01-15
  Filled 2014-01-15: qty 8

## 2014-01-15 MED ORDER — ONDANSETRON 8 MG/50ML IVPB (CHCC)
8.0000 mg | Freq: Once | INTRAVENOUS | Status: AC
  Administered 2014-01-15: 8 mg via INTRAVENOUS

## 2014-01-15 MED ORDER — HEPARIN SOD (PORK) LOCK FLUSH 100 UNIT/ML IV SOLN
500.0000 [IU] | Freq: Once | INTRAVENOUS | Status: AC | PRN
  Administered 2014-01-15: 500 [IU]
  Filled 2014-01-15: qty 5

## 2014-01-15 NOTE — Patient Instructions (Signed)
[facility] Discharge Instructions for Patients Receiving Chemotherapy  Today you received the following chemotherapy agents Abraxane.  To help prevent nausea and vomiting after your treatment, we encourage you to take your nausea medication as directed.    If you develop nausea and vomiting that is not controlled by your nausea medication, call the clinic.   BELOW ARE SYMPTOMS THAT SHOULD BE REPORTED IMMEDIATELY:  *FEVER GREATER THAN 100.5 F  *CHILLS WITH OR WITHOUT FEVER  NAUSEA AND VOMITING THAT IS NOT CONTROLLED WITH YOUR NAUSEA MEDICATION  *UNUSUAL SHORTNESS OF BREATH  *UNUSUAL BRUISING OR BLEEDING  TENDERNESS IN MOUTH AND THROAT WITH OR WITHOUT PRESENCE OF ULCERS  *URINARY PROBLEMS  *BOWEL PROBLEMS  UNUSUAL RASH Items with * indicate a potential emergency and should be followed up as soon as possible.  Feel free to call the clinic you have any questions or concerns. The clinic phone number is [phone].

## 2014-01-17 ENCOUNTER — Ambulatory Visit (HOSPITAL_BASED_OUTPATIENT_CLINIC_OR_DEPARTMENT_OTHER): Payer: Medicare Other | Admitting: Nurse Practitioner

## 2014-01-17 ENCOUNTER — Telehealth: Payer: Self-pay | Admitting: Medical Oncology

## 2014-01-17 ENCOUNTER — Encounter: Payer: Self-pay | Admitting: Nurse Practitioner

## 2014-01-17 ENCOUNTER — Other Ambulatory Visit: Payer: Self-pay | Admitting: Medical Oncology

## 2014-01-17 ENCOUNTER — Ambulatory Visit (HOSPITAL_BASED_OUTPATIENT_CLINIC_OR_DEPARTMENT_OTHER): Payer: Medicare Other

## 2014-01-17 VITALS — BP 149/84 | HR 76 | Temp 98.3°F | Resp 18 | Ht 62.0 in | Wt 170.1 lb

## 2014-01-17 DIAGNOSIS — K625 Hemorrhage of anus and rectum: Secondary | ICD-10-CM

## 2014-01-17 DIAGNOSIS — C3412 Malignant neoplasm of upper lobe, left bronchus or lung: Secondary | ICD-10-CM

## 2014-01-17 DIAGNOSIS — C3492 Malignant neoplasm of unspecified part of left bronchus or lung: Secondary | ICD-10-CM

## 2014-01-17 LAB — CBC WITH DIFFERENTIAL/PLATELET
BASO%: 0.3 % (ref 0.0–2.0)
BASOS ABS: 0 10*3/uL (ref 0.0–0.1)
EOS%: 0.3 % (ref 0.0–7.0)
Eosinophils Absolute: 0 10*3/uL (ref 0.0–0.5)
HEMATOCRIT: 35.7 % (ref 34.8–46.6)
HGB: 12.2 g/dL (ref 11.6–15.9)
LYMPH#: 1.6 10*3/uL (ref 0.9–3.3)
LYMPH%: 27.2 % (ref 14.0–49.7)
MCH: 30.2 pg (ref 25.1–34.0)
MCHC: 34.2 g/dL (ref 31.5–36.0)
MCV: 88.4 fL (ref 79.5–101.0)
MONO#: 0.2 10*3/uL (ref 0.1–0.9)
MONO%: 2.7 % (ref 0.0–14.0)
NEUT#: 4.1 10*3/uL (ref 1.5–6.5)
NEUT%: 69.5 % (ref 38.4–76.8)
Platelets: 176 10*3/uL (ref 145–400)
RBC: 4.04 10*6/uL (ref 3.70–5.45)
RDW: 14.8 % — ABNORMAL HIGH (ref 11.2–14.5)
WBC: 5.9 10*3/uL (ref 3.9–10.3)

## 2014-01-17 LAB — HOLD TUBE, BLOOD BANK

## 2014-01-17 NOTE — Telephone Encounter (Signed)
Pt reports blood on toilet tissue and blood in stool x 3 . Had diarrhea on wed. Lab and Symptom management appt given today.

## 2014-01-17 NOTE — Assessment & Plan Note (Signed)
Patient received cycle 2, day 8 of her carboplatin/Abraxane chemotherapy regimen on 01/15/2014.  She has plans to return for cycle 2, day 15 of the same regimen on 01/22/2014.

## 2014-01-17 NOTE — Assessment & Plan Note (Signed)
Patient noted some bright red blood in her stool after straining to have a bowel movement.  She denies any current diarrhea or constipation at this time however.  She denies any rectal pain.  Upon exam-patient does have a small internal hemorrhoid at approximately the 12:00 region.  She also has a slight skin ulceration at approximately the 4:00 region of her anus.  There is no active infection noted on exam.  Patient was advised to continue with stool softeners twice daily to keep her stools soft.  She was advised to limit sitting on the toilet for long periods of time.  She was encouraged to keep the rectal area clean and dry.  She was advised she could use some Preparation H over-the-counter cream to the rectal area only.  Advised against any rectal suppositories for treatment of internal hemorrhoid.  Blood counts checked today were stable.  Instructed patient to let us know if she develops any worsening bleeding whatsoever.

## 2014-01-17 NOTE — Progress Notes (Signed)
will   SYMPTOM MANAGEMENT CLINIC   HPI: Tammie Clay 68 y.o. female diagnosed with lung cancer.  Currently undergoing carboplatin/Abraxane chemotherapy regimen.  Patient called the cancer Center today requesting urgent care visit.  She states that she strained to have a bowel movement earlier this week; and noted some bright red blood in her stool.  She then developed a few episodes of diarrhea; and continued with some bright red blood in her stool at that time as well.  She denies any rectal pain whatsoever.  She states that she has neither diarrhea or constipation at this time; had a normal bowel movement earlier this morning.  She denies any recent fevers or chills.   HPI  CURRENT THERAPY: Upcoming Treatment Dates - LUNG Abraxane / Carboplatin q21d Days with orders from any treatment category:  01/22/2014      SCHEDULING COMMUNICATION      ondansetron (ZOFRAN) IVPB 8 mg      dexamethasone (DECADRON) injection 10 mg      PACLitaxel-protein bound (ABRAXANE) chemo infusion 175 mg      sodium chloride 0.9 % injection 10 mL      heparin lock flush 100 unit/mL      heparin lock flush 100 unit/mL      alteplase (CATHFLO ACTIVASE) injection 2 mg      sodium chloride 0.9 % injection 3 mL      0.9 %  sodium chloride infusion      TREATMENT CONDITIONS 01/29/2014      SCHEDULING COMMUNICATION      ondansetron (ZOFRAN) IVPB 16 mg      Dexamethasone Sodium Phosphate (DECADRON) injection 20 mg      PACLitaxel-protein bound (ABRAXANE) chemo infusion 175 mg      CARBOplatin (PARAPLATIN) in sodium chloride 0.9 % 100 mL chemo infusion      sodium chloride 0.9 % injection 10 mL      heparin lock flush 100 unit/mL      heparin lock flush 100 unit/mL      alteplase (CATHFLO ACTIVASE) injection 2 mg      sodium chloride 0.9 % injection 3 mL      0.9 %  sodium chloride infusion      TREATMENT CONDITIONS 02/05/2014      SCHEDULING COMMUNICATION      ondansetron (ZOFRAN) IVPB 8 mg  dexamethasone (DECADRON) injection 10 mg      PACLitaxel-protein bound (ABRAXANE) chemo infusion 175 mg      sodium chloride 0.9 % injection 10 mL      heparin lock flush 100 unit/mL      heparin lock flush 100 unit/mL      alteplase (CATHFLO ACTIVASE) injection 2 mg      sodium chloride 0.9 % injection 3 mL      0.9 %  sodium chloride infusion      TREATMENT CONDITIONS    ROS  Past Medical History  Diagnosis Date  . Hypertension   . COPD (chronic obstructive pulmonary disease)   . Hyperlipidemia   . GERD (gastroesophageal reflux disease)   . Osteopenia   . Insomnia   . Mediastinal lymphadenopathy 11/19/13    PER CT  . Multiple lung nodules on CT 11/19/13    LEFT LUNG BASE  . Lung mass 11/19/13    RUL PER CT  . Coronary artery calcification seen on CAT scan 11/21/2013  . COPD (chronic obstructive pulmonary disease) 11/21/2013  . Pneumonia     hx of  .  Anxiety     Past Surgical History  Procedure Laterality Date  .  c section x 2    . Urethral cyst removed    . Tonsillectomy    . Appendectomy    . Colonoscopy with propofol N/A 07/17/2012    Procedure: COLONOSCOPY WITH PROPOFOL;  Surgeon: Garlan Fair, MD;  Location: [facility];  Service: Endoscopy;  Laterality: N/A;  . Esophagogastroduodenoscopy (egd) with propofol N/A 07/17/2012    Procedure: ESOPHAGOGASTRODUODENOSCOPY (EGD) WITH PROPOFOL;  Surgeon: Garlan Fair, MD;  Location: [facility];  Service: Endoscopy;  Laterality: N/A;  . Video bronchoscopy with endobronchial ultrasound N/A 12/03/2013    Procedure: VIDEO BRONCHOSCOPY WITH ENDOBRONCHIAL ULTRASOUND WITH BIOPSIES;  Surgeon: Grace Isaac, MD;  Location: [facility];  Service: Thoracic;  Laterality: N/A;  . Portacath placement Left 12/10/2013    Procedure: INSERTION PORT-A-CATH;  Surgeon: Grace Isaac, MD;  Location: [facility];  Service: Thoracic;  Laterality: Left;    has Hyperlipidemia; GERD (gastroesophageal reflux disease); Osteopenia; Left Upper  Lobe mass, 4cm; Mediastinal lymphadenopathy; Coronary artery calcification seen on CAT scan; COPD (chronic obstructive pulmonary disease); Stage IV squamous cell carcinoma of left lung; Neoplasm related pain; and Rectal bleeding on her problem list.     has No Known Allergies.    Medication List       This list is accurate as of: 01/17/14  6:45 PM.  Always use your most recent med list.               albuterol 108 (90 BASE) MCG/ACT inhaler  Commonly known as:  PROVENTIL HFA;VENTOLIN HFA  Inhale 2 puffs into the lungs every 6 (six) hours as needed for wheezing.     diazepam 5 MG tablet  Commonly known as:  VALIUM  Take 5 mg by mouth at bedtime.     DULoxetine 30 MG capsule  Commonly known as:  CYMBALTA  Take 1 capsule (30 mg total) by mouth daily.     HYDROcodone-acetaminophen 5-325 MG per tablet  Commonly known as:  NORCO/VICODIN  Take 1-2 tablets by mouth every 6 (six) hours as needed for moderate pain.     lidocaine-prilocaine cream  Commonly known as:  EMLA  Apply 1 application topically as needed.     lisinopril-hydrochlorothiazide 20-12.5 MG per tablet  Commonly known as:  PRINZIDE,ZESTORETIC  Take 2 tablets by mouth every morning.     metoprolol succinate 50 MG 24 hr tablet  Commonly known as:  TOPROL-XL  Take 50 mg by mouth every morning. Take with or immediately following a meal.     multivitamin with minerals Tabs tablet  Take 1 tablet by mouth daily.     prochlorperazine 10 MG tablet  Commonly known as:  COMPAZINE  Take 1 tablet (10 mg total) by mouth every 6 (six) hours as needed for nausea or vomiting.     tiotropium 18 MCG inhalation capsule  Commonly known as:  SPIRIVA  Place 18 mcg into inhaler and inhale daily.         PHYSICAL EXAMINATION  Blood pressure 149/84, pulse 76, temperature 98.3 F (36.8 C), temperature source Oral, resp. rate 18, height 5' 2"  (1.575 m), weight 170 lb 1.6 oz (77.157 kg), SpO2 99 %.  Physical Exam    Constitutional: She is oriented to person, place, and time and well-developed, well-nourished, and in no distress.  HENT:  Head: Normocephalic and atraumatic.  Eyes: Conjunctivae and EOM are normal. Pupils are equal, round, and reactive to  light. Right eye exhibits no discharge. Left eye exhibits no discharge. No scleral icterus.  Neck: Normal range of motion. No tracheal deviation present.  Pulmonary/Chest: Effort normal. No respiratory distress.  Genitourinary:  Rectal exam revealed a tiny internal hemorrhoid at approximately the 12:00 area; and patient also noted to have a superficial ulceration/skin irritation at approximately the 4:00 region of the anus.  No active infection noted on exam.  Musculoskeletal: Normal range of motion. She exhibits no edema or tenderness.  Neurological: She is alert and oriented to person, place, and time. Gait normal.  Skin: Skin is warm and dry. No rash noted. No erythema.  See previous note regarding hemorrhoid and rectal ulceration.  Also of note-there is no rectal fissure on exam.  Psychiatric: Affect normal.  Nursing note and vitals reviewed.   LABORATORY DATA:. Appointment on 01/17/2014  Component Date Value Ref Range Status  . WBC 01/17/2014 5.9  3.9 - 10.3 10e3/uL Final  . NEUT# 01/17/2014 4.1  1.5 - 6.5 10e3/uL Final  . HGB 01/17/2014 12.2  11.6 - 15.9 g/dL Final  . HCT 01/17/2014 35.7  34.8 - 46.6 % Final  . Platelets 01/17/2014 176  145 - 400 10e3/uL Final  . MCV 01/17/2014 88.4  79.5 - 101.0 fL Final  . MCH 01/17/2014 30.2  25.1 - 34.0 pg Final  . MCHC 01/17/2014 34.2  31.5 - 36.0 g/dL Final  . RBC 01/17/2014 4.04  3.70 - 5.45 10e6/uL Final  . RDW 01/17/2014 14.8* 11.2 - 14.5 % Final  . lymph# 01/17/2014 1.6  0.9 - 3.3 10e3/uL Final  . MONO# 01/17/2014 0.2  0.1 - 0.9 10e3/uL Final  . Eosinophils Absolute 01/17/2014 0.0  0.0 - 0.5 10e3/uL Final  . Basophils Absolute 01/17/2014 0.0  0.0 - 0.1 10e3/uL Final  . NEUT% 01/17/2014 69.5  38.4  - 76.8 % Final  . LYMPH% 01/17/2014 27.2  14.0 - 49.7 % Final  . MONO% 01/17/2014 2.7  0.0 - 14.0 % Final  . EOS% 01/17/2014 0.3  0.0 - 7.0 % Final  . BASO% 01/17/2014 0.3  0.0 - 2.0 % Final  . Hold Tube, Blood Bank 01/17/2014 Blood Bank Order Cancelled   Final  Appointment on 01/15/2014  Component Date Value Ref Range Status  . WBC 01/15/2014 4.6  3.9 - 10.3 10e3/uL Final  . NEUT# 01/15/2014 3.2  1.5 - 6.5 10e3/uL Final  . HGB 01/15/2014 12.2  11.6 - 15.9 g/dL Final  . HCT 01/15/2014 37.5  34.8 - 46.6 % Final  . Platelets 01/15/2014 188  145 - 400 10e3/uL Final  . MCV 01/15/2014 90.3  79.5 - 101.0 fL Final  . MCH 01/15/2014 29.3  25.1 - 34.0 pg Final  . MCHC 01/15/2014 32.5  31.5 - 36.0 g/dL Final  . RBC 01/15/2014 4.16  3.70 - 5.45 10e6/uL Final  . RDW 01/15/2014 14.4  11.2 - 14.5 % Final  . lymph# 01/15/2014 1.1  0.9 - 3.3 10e3/uL Final  . MONO# 01/15/2014 0.2  0.1 - 0.9 10e3/uL Final  . Eosinophils Absolute 01/15/2014 0.0  0.0 - 0.5 10e3/uL Final  . Basophils Absolute 01/15/2014 0.0  0.0 - 0.1 10e3/uL Final  . NEUT% 01/15/2014 69.8  38.4 - 76.8 % Final  . LYMPH% 01/15/2014 23.6  14.0 - 49.7 % Final  . MONO% 01/15/2014 5.3  0.0 - 14.0 % Final  . EOS% 01/15/2014 0.5  0.0 - 7.0 % Final  . BASO% 01/15/2014 0.8  0.0 - 2.0 % Final  .  Sodium 01/15/2014 139  136 - 145 mEq/L Final  . Potassium 01/15/2014 4.1  3.5 - 5.1 mEq/L Final  . Chloride 01/15/2014 100  98 - 109 mEq/L Final  . CO2 01/15/2014 27  22 - 29 mEq/L Final  . Glucose 01/15/2014 105  70 - 140 mg/dl Final  . BUN 01/15/2014 22.8  7.0 - 26.0 mg/dL Final  . Creatinine 01/15/2014 1.0  0.6 - 1.1 mg/dL Final  . Total Bilirubin 01/15/2014 0.59  0.20 - 1.20 mg/dL Final  . Alkaline Phosphatase 01/15/2014 78  40 - 150 U/L Final  . AST 01/15/2014 19  5 - 34 U/L Final  . ALT 01/15/2014 24  0 - 55 U/L Final  . Total Protein 01/15/2014 6.8  6.4 - 8.3 g/dL Final  . Albumin 01/15/2014 3.8  3.5 - 5.0 g/dL Final  . Calcium 01/15/2014 9.7   8.4 - 10.4 mg/dL Final  . Anion Gap 01/15/2014 11  3 - 11 mEq/L Final  . EGFR 01/15/2014 59* >90 ml/min/1.73 m2 Final   eGFR is calculated using the CKD-EPI Creatinine Equation (2009)     RADIOGRAPHIC STUDIES: No results found.  ASSESSMENT/PLAN:    Rectal bleeding Patient noted some bright red blood in her stool after straining to have a bowel movement.  She denies any current diarrhea or constipation at this time however.  She denies any rectal pain.  Upon exam-patient does have a small internal hemorrhoid at approximately the 12:00 region.  She also has a slight skin ulceration at approximately the 4:00 region of her anus.  There is no active infection noted on exam.  Patient was advised to continue with stool softeners twice daily to keep her stools soft.  She was advised to limit sitting on the toilet for long periods of time.  She was encouraged to keep the rectal area clean and dry.  She was advised she could use some Preparation H over-the-counter cream to the rectal area only.  Advised against any rectal suppositories for treatment of internal hemorrhoid.  Blood counts checked today were stable.  Instructed patient to let us know if she develops any worsening bleeding whatsoever.  Stage IV squamous cell carcinoma of left lung Patient received cycle 2, day 8 of her carboplatin/Abraxane chemotherapy regimen on 01/15/2014.  She has plans to return for cycle 2, day 15 of the same regimen on 01/22/2014.  Patient stated understanding of all instructions; and was in agreement with this plan of care. The patient knows to call the clinic with any problems, questions or concerns.   Review/collaboration with Dr. [name] regarding all aspects of patient's visit today.   Total time spent with patient was 25 minutes;  with greater than 75 percent of that time spent in face to face counseling regarding her symptoms, and coordination of care and follow up.  Disclaimer: This note was dictated with  voice recognition software. Similar sounding words can inadvertently be transcribed and may not be corrected upon review.   Drue Second, NP 01/17/2014

## 2014-01-22 ENCOUNTER — Ambulatory Visit (HOSPITAL_BASED_OUTPATIENT_CLINIC_OR_DEPARTMENT_OTHER): Payer: Medicare Other | Admitting: Internal Medicine

## 2014-01-22 ENCOUNTER — Telehealth: Payer: Self-pay | Admitting: Internal Medicine

## 2014-01-22 ENCOUNTER — Other Ambulatory Visit (HOSPITAL_BASED_OUTPATIENT_CLINIC_OR_DEPARTMENT_OTHER): Payer: Medicare Other

## 2014-01-22 ENCOUNTER — Encounter: Payer: Self-pay | Admitting: Internal Medicine

## 2014-01-22 ENCOUNTER — Ambulatory Visit: Payer: Medicare Other

## 2014-01-22 VITALS — BP 127/71 | HR 102 | Temp 98.4°F | Resp 17 | Ht 62.0 in | Wt 171.3 lb

## 2014-01-22 DIAGNOSIS — D701 Agranulocytosis secondary to cancer chemotherapy: Secondary | ICD-10-CM

## 2014-01-22 DIAGNOSIS — R5383 Other fatigue: Secondary | ICD-10-CM

## 2014-01-22 DIAGNOSIS — R509 Fever, unspecified: Secondary | ICD-10-CM

## 2014-01-22 DIAGNOSIS — G4700 Insomnia, unspecified: Secondary | ICD-10-CM

## 2014-01-22 DIAGNOSIS — C3412 Malignant neoplasm of upper lobe, left bronchus or lung: Secondary | ICD-10-CM

## 2014-01-22 DIAGNOSIS — T451X5A Adverse effect of antineoplastic and immunosuppressive drugs, initial encounter: Principal | ICD-10-CM

## 2014-01-22 DIAGNOSIS — D702 Other drug-induced agranulocytosis: Secondary | ICD-10-CM

## 2014-01-22 DIAGNOSIS — C3492 Malignant neoplasm of unspecified part of left bronchus or lung: Secondary | ICD-10-CM

## 2014-01-22 LAB — CBC WITH DIFFERENTIAL/PLATELET
BASO%: 2.1 % — ABNORMAL HIGH (ref 0.0–2.0)
BASOS ABS: 0 10*3/uL (ref 0.0–0.1)
EOS ABS: 0 10*3/uL (ref 0.0–0.5)
EOS%: 1 % (ref 0.0–7.0)
HCT: 33.6 % — ABNORMAL LOW (ref 34.8–46.6)
HEMOGLOBIN: 11.4 g/dL — AB (ref 11.6–15.9)
LYMPH%: 37.7 % (ref 14.0–49.7)
MCH: 30 pg (ref 25.1–34.0)
MCHC: 33.9 g/dL (ref 31.5–36.0)
MCV: 88.4 fL (ref 79.5–101.0)
MONO#: 0.4 10*3/uL (ref 0.1–0.9)
MONO%: 19.9 % — AB (ref 0.0–14.0)
NEUT%: 39.3 % (ref 38.4–76.8)
NEUTROS ABS: 0.8 10*3/uL — AB (ref 1.5–6.5)
PLATELETS: 267 10*3/uL (ref 145–400)
RBC: 3.8 10*6/uL (ref 3.70–5.45)
RDW: 15.5 % — ABNORMAL HIGH (ref 11.2–14.5)
WBC: 1.9 10*3/uL — AB (ref 3.9–10.3)
lymph#: 0.7 10*3/uL — ABNORMAL LOW (ref 0.9–3.3)

## 2014-01-22 LAB — COMPREHENSIVE METABOLIC PANEL (CC13)
ALBUMIN: 3.6 g/dL (ref 3.5–5.0)
ALK PHOS: 69 U/L (ref 40–150)
ALT: 24 U/L (ref 0–55)
ANION GAP: 11 meq/L (ref 3–11)
AST: 17 U/L (ref 5–34)
BUN: 13.9 mg/dL (ref 7.0–26.0)
CO2: 28 meq/L (ref 22–29)
Calcium: 8.6 mg/dL (ref 8.4–10.4)
Chloride: 98 mEq/L (ref 98–109)
Creatinine: 0.9 mg/dL (ref 0.6–1.1)
EGFR: 64 mL/min/{1.73_m2} — ABNORMAL LOW (ref >90)
GLUCOSE: 111 mg/dL (ref 70–140)
Potassium: 3.8 mEq/L (ref 3.5–5.1)
SODIUM: 137 meq/L (ref 136–145)
TOTAL PROTEIN: 6.6 g/dL (ref 6.4–8.3)
Total Bilirubin: 0.33 mg/dL (ref 0.20–1.20)

## 2014-01-22 LAB — TECHNOLOGIST REVIEW

## 2014-01-22 MED ORDER — TEMAZEPAM 30 MG PO CAPS: 30.0000 mg | ORAL_CAPSULE | Freq: Every evening | ORAL | Status: DC | PRN

## 2014-01-22 MED ORDER — LEVOFLOXACIN 500 MG PO TABS: 500.0000 mg | ORAL_TABLET | Freq: Every day | ORAL | Status: DC

## 2014-01-22 MED ORDER — TBO-FILGRASTIM 480 MCG/0.8ML ~~LOC~~ SOSY
480.0000 ug | PREFILLED_SYRINGE | Freq: Once | SUBCUTANEOUS | Status: AC
  Administered 2014-01-22: 480 ug via SUBCUTANEOUS
  Filled 2014-01-22: qty 0.8

## 2014-01-22 NOTE — Progress Notes (Signed)
[place] [phone]   Fax:(336) [place] [address] [place] [address]  DIAGNOSIS: Stage IV (T2, N2, M1a) non-small cell lung cancer, squamous cell carcinoma presented with central left upper lobe lung mass with associated postobstructive atelectasis as well as ipsilateral and subcarinal lymphadenopathy as well as pleural tumor spread diagnosed in October 2015.  PRIOR THERAPY: None.  CURRENT THERAPY: Systemic chemotherapy with carboplatin for AUC of 5 on day 1 and Abraxane 100 MG/M2 on days 1, 8 and 15 every 3 weeks. Status post 2 cycles.   INTERVAL HISTORY: Tammie Clay 68 y.o. female returns to the clinic today for follow-up visit accompanied by her significant other. The patient is feeling fine today with no specific complaints except for fatigue and insomnia. She is tolerating her current systemic chemotherapy with carboplatin and Abraxane fairly well. She had fever last night up to 101 but she did not called the cancer Center. She took 2 Tylenol as well as improvement in her fever. She denied having any significant chills. She had mild cough started 2 days ago. The patient denied having any significant chest pain, shortness of breath or hemoptysis. She has no nausea or vomiting. She denied having any significant weight loss or night sweats. She was supposed to start day 15 of cycle #2 today. She has a lot of questions about her current condition and the immunotherapy commercials that she sees on TV as well as nutritional supplements.  MEDICAL HISTORY: Past Medical History  Diagnosis Date  . Hypertension   . COPD (chronic obstructive pulmonary disease)   . Hyperlipidemia   . GERD (gastroesophageal reflux disease)   . Osteopenia   . Insomnia   . Mediastinal lymphadenopathy 11/19/13    PER CT  . Multiple lung nodules on CT 11/19/13    LEFT LUNG BASE  . Lung mass 11/19/13    RUL  PER CT  . Coronary artery calcification seen on CAT scan 11/21/2013  . COPD (chronic obstructive pulmonary disease) 11/21/2013  . Pneumonia     hx of  . Anxiety     ALLERGIES:  has No Known Allergies.  MEDICATIONS:  Current Outpatient Prescriptions  Medication Sig Dispense Refill  . albuterol (PROVENTIL HFA;VENTOLIN HFA) 108 (90 BASE) MCG/ACT inhaler Inhale 2 puffs into the lungs every 6 (six) hours as needed for wheezing.    . diazepam (VALIUM) 5 MG tablet Take 5 mg by mouth at bedtime.    . DULoxetine (CYMBALTA) 30 MG capsule Take 1 capsule (30 mg total) by mouth daily. 30 capsule 2  . HYDROcodone-acetaminophen (NORCO/VICODIN) 5-325 MG per tablet Take 1-2 tablets by mouth every 6 (six) hours as needed for moderate pain.    Marland Kitchen lidocaine-prilocaine (EMLA) cream Apply 1 application topically as needed. 30 g 0  . lisinopril-hydrochlorothiazide (PRINZIDE,ZESTORETIC) 20-12.5 MG per tablet Take 2 tablets by mouth every morning.     . metoprolol succinate (TOPROL-XL) 50 MG 24 hr tablet Take 50 mg by mouth every morning. Take with or immediately following a meal.    . Multiple Vitamin (MULTIVITAMIN WITH MINERALS) TABS Take 1 tablet by mouth daily.    . prochlorperazine (COMPAZINE) 10 MG tablet Take 1 tablet (10 mg total) by mouth every 6 (six) hours as needed for nausea or vomiting. 30 tablet 0  . tiotropium (SPIRIVA) 18 MCG inhalation capsule Place 18 mcg into inhaler and inhale daily.     No  current facility-administered medications for this visit.    SURGICAL HISTORY:  Past Surgical History  Procedure Laterality Date  .  c section x 2    . Urethral cyst removed    . Tonsillectomy    . Appendectomy    . Colonoscopy with propofol N/A 07/17/2012    Procedure: COLONOSCOPY WITH PROPOFOL;  Surgeon: Garlan Fair, MD;  Location: [facility];  Service: Endoscopy;  Laterality: N/A;  . Esophagogastroduodenoscopy (egd) with propofol N/A 07/17/2012    Procedure: ESOPHAGOGASTRODUODENOSCOPY (EGD)  WITH PROPOFOL;  Surgeon: Garlan Fair, MD;  Location: [facility];  Service: Endoscopy;  Laterality: N/A;  . Video bronchoscopy with endobronchial ultrasound N/A 12/03/2013    Procedure: VIDEO BRONCHOSCOPY WITH ENDOBRONCHIAL ULTRASOUND WITH BIOPSIES;  Surgeon: Grace Isaac, MD;  Location: [facility];  Service: Thoracic;  Laterality: N/A;  . Portacath placement Left 12/10/2013    Procedure: INSERTION PORT-A-CATH;  Surgeon: Grace Isaac, MD;  Location: [facility];  Service: Thoracic;  Laterality: Left;    REVIEW OF SYSTEMS:  Constitutional: positive for fatigue Eyes: negative Ears, nose, mouth, throat, and face: negative Respiratory: positive for cough Cardiovascular: negative Gastrointestinal: negative Genitourinary:negative Integument/breast: negative Hematologic/lymphatic: negative Musculoskeletal:negative Neurological: negative Behavioral/Psych: negative Endocrine: negative Allergic/Immunologic: negative   PHYSICAL EXAMINATION: General appearance: alert, cooperative, fatigued and no distress Head: Normocephalic, without obvious abnormality, atraumatic Neck: no adenopathy, no JVD, supple, symmetrical, trachea midline and thyroid not enlarged, symmetric, no tenderness/mass/nodules Lymph nodes: Cervical, supraclavicular, and axillary nodes normal. Resp: clear to auscultation bilaterally Back: symmetric, no curvature. ROM normal. No CVA tenderness. Cardio: regular rate and rhythm, S1, S2 normal, no murmur, click, rub or gallop GI: soft, non-tender; bowel sounds normal; no masses,  no organomegaly Genitalia: defer exam Extremities: extremities normal, atraumatic, no cyanosis or edema Neurologic: Alert and oriented X 3, normal strength and tone. Normal symmetric reflexes. Normal coordination and gait  ECOG PERFORMANCE STATUS: 1 - Symptomatic but completely ambulatory  Blood pressure 127/71, pulse 102, temperature 98.4 F (36.9 C), temperature source Oral, resp. rate 17, height 5'  2" (1.575 m), weight 171 lb 4.8 oz (77.701 kg), SpO2 98 %.  LABORATORY DATA: Lab Results  Component Value Date   WBC 1.9* 01/22/2014   HGB 11.4* 01/22/2014   HCT 33.6* 01/22/2014   MCV 88.4 01/22/2014   PLT 267 01/22/2014      Chemistry      Component Value Date/Time   NA 137 01/22/2014 0832   NA 138 12/10/2013 0637   K 3.8 01/22/2014 0832   K 4.7 12/10/2013 0637   CL 99 12/10/2013 0637   CO2 28 01/22/2014 0832   CO2 28 12/10/2013 0637   BUN 13.9 01/22/2014 0832   BUN 22 12/10/2013 0637   CREATININE 0.9 01/22/2014 0832   CREATININE 0.75 12/10/2013 0637   CREATININE 0.80 11/22/2013 1054      Component Value Date/Time   CALCIUM 8.6 01/22/2014 0832   CALCIUM 9.8 12/10/2013 0637   ALKPHOS 69 01/22/2014 0832   ALKPHOS 77 12/10/2013 0637   AST 17 01/22/2014 0832   AST 16 12/10/2013 0637   ALT 24 01/22/2014 0832   ALT 16 12/10/2013 0637   BILITOT 0.33 01/22/2014 0832   BILITOT 0.4 12/10/2013 5643       RADIOGRAPHIC STUDIES:  ASSESSMENT AND PLAN: This is a very pleasant 68 years old white female recently diagnosed with:  1) Stage IV non-small cell lung cancer, squamous cell carcinoma currently undergoing systemic chemotherapy with carboplatin and Abraxane status post  2 cycles. She is tolerating her treatment fairly well with no significant adverse effects except for fatigue. Her absolute neutrophil count is low today. We will skip day 15 of cycle #2. The patient will resume her chemotherapy next week with day 1 of cycle #3 if her blood count meets the parameter for treatment. I had a lengthy discussion with the patient about her condition and also treatment options especially immunotherapies that she has seen on TV and I explained to the patient that these drugs are approved for the second line setting. She also has a lot of question about nutritional supplements including vitamin D, vitamin C and herbal medications. I advised the patient against taking any herbal  medication right now but she is okay to take reasonable amounts of vitamin D and vitamin C but not in high doses.  2) chemotherapy-induced neutropenia: I will start the patient on treatment with Granix 480 g subcutaneously 1 today.  3) fever and questionable acute bronchitis: I will start the patient on treatment with Levaquin 500 mg by mouth daily for 7 days  She will come back for follow-up visit in 2 weeks for reevaluation. She will have repeat CT scan of the chest, abdomen and pelvis after completion of cycle #3. The patient voices understanding of current disease status and treatment options and is in agreement with the current care plan.  All questions were answered. The patient knows to call the clinic with any problems, questions or concerns. We can certainly see the patient much sooner if necessary.  I spent 20 minutes counseling the patient face to face. The total time spent in the appointment was 30 minutes.  Disclaimer: This note was dictated with voice recognition software. Similar sounding words can inadvertently be transcribed and may not be corrected upon review.

## 2014-01-22 NOTE — Telephone Encounter (Signed)
gave avs & cal for Dec & Jan. Sent mess to sch tx.

## 2014-01-24 ENCOUNTER — Ambulatory Visit (HOSPITAL_BASED_OUTPATIENT_CLINIC_OR_DEPARTMENT_OTHER): Payer: Medicare Other

## 2014-01-24 ENCOUNTER — Ambulatory Visit (HOSPITAL_BASED_OUTPATIENT_CLINIC_OR_DEPARTMENT_OTHER): Payer: Medicare Other | Admitting: Nurse Practitioner

## 2014-01-24 ENCOUNTER — Telehealth: Payer: Self-pay | Admitting: *Deleted

## 2014-01-24 ENCOUNTER — Other Ambulatory Visit: Payer: Self-pay | Admitting: *Deleted

## 2014-01-24 ENCOUNTER — Encounter: Payer: Self-pay | Admitting: Nurse Practitioner

## 2014-01-24 VITALS — BP 122/87 | HR 105 | Temp 100.0°F | Resp 19 | Ht 62.0 in

## 2014-01-24 DIAGNOSIS — R531 Weakness: Secondary | ICD-10-CM | POA: Insufficient documentation

## 2014-01-24 DIAGNOSIS — C3492 Malignant neoplasm of unspecified part of left bronchus or lung: Secondary | ICD-10-CM

## 2014-01-24 DIAGNOSIS — T451X5A Adverse effect of antineoplastic and immunosuppressive drugs, initial encounter: Secondary | ICD-10-CM

## 2014-01-24 DIAGNOSIS — J40 Bronchitis, not specified as acute or chronic: Secondary | ICD-10-CM

## 2014-01-24 DIAGNOSIS — D701 Agranulocytosis secondary to cancer chemotherapy: Secondary | ICD-10-CM

## 2014-01-24 DIAGNOSIS — G4700 Insomnia, unspecified: Secondary | ICD-10-CM | POA: Insufficient documentation

## 2014-01-24 LAB — COMPREHENSIVE METABOLIC PANEL (CC13)
ALBUMIN: 3.4 g/dL — AB (ref 3.5–5.0)
ALT: 20 U/L (ref 0–55)
AST: 21 U/L (ref 5–34)
Alkaline Phosphatase: 79 U/L (ref 40–150)
Anion Gap: 14 mEq/L — ABNORMAL HIGH (ref 3–11)
BUN: 13.1 mg/dL (ref 7.0–26.0)
CHLORIDE: 98 meq/L (ref 98–109)
CO2: 25 mEq/L (ref 22–29)
Calcium: 9 mg/dL (ref 8.4–10.4)
Creatinine: 0.9 mg/dL (ref 0.6–1.1)
EGFR: 65 mL/min/{1.73_m2} — ABNORMAL LOW (ref >90)
Glucose: 111 mg/dl (ref 70–140)
POTASSIUM: 3.6 meq/L (ref 3.5–5.1)
SODIUM: 136 meq/L (ref 136–145)
Total Bilirubin: 0.25 mg/dL (ref 0.20–1.20)
Total Protein: 6.6 g/dL (ref 6.4–8.3)

## 2014-01-24 LAB — CBC WITH DIFFERENTIAL/PLATELET
BASO%: 0.2 % (ref 0.0–2.0)
Basophils Absolute: 0 10*3/uL (ref 0.0–0.1)
EOS%: 0.1 % (ref 0.0–7.0)
Eosinophils Absolute: 0 10*3/uL (ref 0.0–0.5)
HCT: 32.9 % — ABNORMAL LOW (ref 34.8–46.6)
HGB: 11.2 g/dL — ABNORMAL LOW (ref 11.6–15.9)
LYMPH#: 1.3 10*3/uL (ref 0.9–3.3)
LYMPH%: 9.9 % — ABNORMAL LOW (ref 14.0–49.7)
MCH: 30.1 pg (ref 25.1–34.0)
MCHC: 34 g/dL (ref 31.5–36.0)
MCV: 88.4 fL (ref 79.5–101.0)
MONO#: 1.2 10*3/uL — ABNORMAL HIGH (ref 0.1–0.9)
MONO%: 8.7 % (ref 0.0–14.0)
NEUT#: 10.7 10*3/uL — ABNORMAL HIGH (ref 1.5–6.5)
NEUT%: 81.1 % — ABNORMAL HIGH (ref 38.4–76.8)
Platelets: 244 10*3/uL (ref 145–400)
RBC: 3.72 10*6/uL (ref 3.70–5.45)
RDW: 16.4 % — AB (ref 11.2–14.5)
WBC: 13.2 10*3/uL — ABNORMAL HIGH (ref 3.9–10.3)

## 2014-01-24 NOTE — Assessment & Plan Note (Signed)
Patient is complaining of some mild increased generalized weakness and fatigue.  She does appear slightly shaky today as well.  Most likely, this is both a chemotherapy side effect; as well as a symptom of patient's bronchitis infection.  Patient was encouraged to push fluids and remain as active as possible.

## 2014-01-24 NOTE — Assessment & Plan Note (Signed)
Patient was found to be neutropenic with an [place] of 0.8 this past Wednesday, 01/22/2014.  Patient did receive a Granix injection on Wednesday, 01/22/2014; and ANC is currently 10.7.  Will continue to monitor closely.

## 2014-01-24 NOTE — Progress Notes (Signed)
will   SYMPTOM MANAGEMENT CLINIC   HPI: Tammie Clay 68 y.o. female diagnosed with lung cancer.  Currently undergoing carboplatin/Abraxane chemotherapy regimen.  Patient called the cancer Center today requesting urgent care visit.  She was diagnosed this past Wednesday, 01/22/2014 with bronchitis.  She was prescribed Levaquin at that time.  She reports that she continues with a productive cough and intermittent fevers.  She feels increasingly fatigued and weak.  She states that she is slightly shaky today as well.  She feels that she has been hydrating fairly well; and states that she does not need any IV fluid rehydration today.  She is also continuing to complain of some chronic insomnia.  She states she was prescribed Restoril to try this week as well; but it was fairly ineffective.  She denies any new symptoms whatsoever.  Also, patient was found to be neutropenic this week; and received a Granix injection on Wednesday, 01/22/2014.   HPI  CURRENT THERAPY: Upcoming Treatment Dates - LUNG Abraxane / Carboplatin q21d Days with orders from any treatment category:  01/22/2014      SCHEDULING COMMUNICATION      ondansetron (ZOFRAN) IVPB 8 mg      dexamethasone (DECADRON) injection 10 mg      PACLitaxel-protein bound (ABRAXANE) chemo infusion 175 mg      sodium chloride 0.9 % injection 10 mL      heparin lock flush 100 unit/mL      heparin lock flush 100 unit/mL      alteplase (CATHFLO ACTIVASE) injection 2 mg      sodium chloride 0.9 % injection 3 mL      0.9 %  sodium chloride infusion      TREATMENT CONDITIONS 01/29/2014      SCHEDULING COMMUNICATION      ondansetron (ZOFRAN) IVPB 16 mg      Dexamethasone Sodium Phosphate (DECADRON) injection 20 mg      PACLitaxel-protein bound (ABRAXANE) chemo infusion 175 mg      CARBOplatin (PARAPLATIN) 450 mg in sodium chloride 0.9 % 250 mL chemo infusion      sodium chloride 0.9 % injection 10 mL      heparin lock flush 100 unit/mL  heparin lock flush 100 unit/mL      alteplase (CATHFLO ACTIVASE) injection 2 mg      sodium chloride 0.9 % injection 3 mL      0.9 %  sodium chloride infusion      TREATMENT CONDITIONS 02/05/2014      SCHEDULING COMMUNICATION      ondansetron (ZOFRAN) IVPB 8 mg      dexamethasone (DECADRON) injection 10 mg      PACLitaxel-protein bound (ABRAXANE) chemo infusion 175 mg      sodium chloride 0.9 % injection 10 mL      heparin lock flush 100 unit/mL      heparin lock flush 100 unit/mL      alteplase (CATHFLO ACTIVASE) injection 2 mg      sodium chloride 0.9 % injection 3 mL      0.9 %  sodium chloride infusion      TREATMENT CONDITIONS    ROS  Past Medical History  Diagnosis Date  . Hypertension   . COPD (chronic obstructive pulmonary disease)   . Hyperlipidemia   . GERD (gastroesophageal reflux disease)   . Osteopenia   . Insomnia   . Mediastinal lymphadenopathy 11/19/13    PER CT  . Multiple lung nodules on CT 11/19/13  LEFT LUNG BASE  . Lung mass 11/19/13    RUL PER CT  . Coronary artery calcification seen on CAT scan 11/21/2013  . COPD (chronic obstructive pulmonary disease) 11/21/2013  . Pneumonia     hx of  . Anxiety     Past Surgical History  Procedure Laterality Date  .  c section x 2    . Urethral cyst removed    . Tonsillectomy    . Appendectomy    . Colonoscopy with propofol N/A 07/17/2012    Procedure: COLONOSCOPY WITH PROPOFOL;  Surgeon: Garlan Fair, MD;  Location: [facility];  Service: Endoscopy;  Laterality: N/A;  . Esophagogastroduodenoscopy (egd) with propofol N/A 07/17/2012    Procedure: ESOPHAGOGASTRODUODENOSCOPY (EGD) WITH PROPOFOL;  Surgeon: Garlan Fair, MD;  Location: [facility];  Service: Endoscopy;  Laterality: N/A;  . Video bronchoscopy with endobronchial ultrasound N/A 12/03/2013    Procedure: VIDEO BRONCHOSCOPY WITH ENDOBRONCHIAL ULTRASOUND WITH BIOPSIES;  Surgeon: Grace Isaac, MD;  Location: [facility];  Service: Thoracic;   Laterality: N/A;  . Portacath placement Left 12/10/2013    Procedure: INSERTION PORT-A-CATH;  Surgeon: Grace Isaac, MD;  Location: [facility];  Service: Thoracic;  Laterality: Left;    has Hyperlipidemia; GERD (gastroesophageal reflux disease); Osteopenia; Left Upper Lobe mass, 4cm; Mediastinal lymphadenopathy; Coronary artery calcification seen on CAT scan; COPD (chronic obstructive pulmonary disease); Stage IV squamous cell carcinoma of left lung; Neoplasm related pain; Rectal bleeding; Chemotherapy induced neutropenia; Weakness; Insomnia; and Bronchitis on her problem list.     has No Known Allergies.    Medication List       This list is accurate as of: 01/24/14  1:10 PM.  Always use your most recent med list.               albuterol 108 (90 BASE) MCG/ACT inhaler  Commonly known as:  PROVENTIL HFA;VENTOLIN HFA  Inhale 2 puffs into the lungs every 6 (six) hours as needed for wheezing.     diazepam 5 MG tablet  Commonly known as:  VALIUM  Take 5 mg by mouth at bedtime.     DULoxetine 30 MG capsule  Commonly known as:  CYMBALTA  Take 1 capsule (30 mg total) by mouth daily.     HYDROcodone-acetaminophen 5-325 MG per tablet  Commonly known as:  NORCO/VICODIN  Take 1-2 tablets by mouth every 6 (six) hours as needed for moderate pain.     levofloxacin 500 MG tablet  Commonly known as:  LEVAQUIN  Take 1 tablet (500 mg total) by mouth daily.     lidocaine-prilocaine cream  Commonly known as:  EMLA  Apply 1 application topically as needed.     lisinopril-hydrochlorothiazide 20-12.5 MG per tablet  Commonly known as:  PRINZIDE,ZESTORETIC  Take 2 tablets by mouth every morning.     metoprolol succinate 50 MG 24 hr tablet  Commonly known as:  TOPROL-XL  Take 50 mg by mouth every morning. Take with or immediately following a meal.     multivitamin with minerals Tabs tablet  Take 1 tablet by mouth daily.     prochlorperazine 10 MG tablet  Commonly known as:  COMPAZINE    Take 1 tablet (10 mg total) by mouth every 6 (six) hours as needed for nausea or vomiting.     temazepam 30 MG capsule  Commonly known as:  RESTORIL  Take 1 capsule (30 mg total) by mouth at bedtime as needed for sleep.     tiotropium  18 MCG inhalation capsule  Commonly known as:  SPIRIVA  Place 18 mcg into inhaler and inhale daily.         PHYSICAL EXAMINATION  Blood pressure 122/87, pulse 105, temperature 100 F (37.8 C), temperature source Oral, resp. rate 19, height 5' 2"  (1.575 m), weight 0 lb (0 kg), SpO2 97 %.  Physical Exam  Constitutional: She is oriented to person, place, and time. She appears unhealthy.  HENT:  Head: Normocephalic and atraumatic.  Mouth/Throat: Oropharynx is clear and moist.  Eyes: Conjunctivae and EOM are normal. Pupils are equal, round, and reactive to light. Right eye exhibits no discharge. Left eye exhibits no discharge. No scleral icterus.  Neck: Normal range of motion. Neck supple. No JVD present. No tracheal deviation present. No thyromegaly present.  Cardiovascular: Normal rate, regular rhythm, normal heart sounds and intact distal pulses.   Pulmonary/Chest: Effort normal. No respiratory distress. She has no wheezes. She has rales.  Patient has some bilateral rhonchi; but no cough or acute respiratory distress on exam.  Also, no wheezing noted.  Abdominal: Soft. Bowel sounds are normal. She exhibits no distension and no mass. There is no tenderness. There is no rebound and no guarding.  Musculoskeletal: Normal range of motion. She exhibits no edema or tenderness.  Lymphadenopathy:    She has no cervical adenopathy.  Neurological: She is alert and oriented to person, place, and time.  Patient appears fairly weak and shaky today.  Skin: Skin is warm and dry. No rash noted. No erythema. There is pallor.  Psychiatric: Affect normal.  Nursing note and vitals reviewed.   LABORATORY DATA:. Appointment on 01/24/2014  Component Date Value Ref  Range Status  . WBC 01/24/2014 13.2* 3.9 - 10.3 10e3/uL Final  . NEUT# 01/24/2014 10.7* 1.5 - 6.5 10e3/uL Final  . HGB 01/24/2014 11.2* 11.6 - 15.9 g/dL Final  . HCT 01/24/2014 32.9* 34.8 - 46.6 % Final  . Platelets 01/24/2014 244  145 - 400 10e3/uL Final  . MCV 01/24/2014 88.4  79.5 - 101.0 fL Final  . MCH 01/24/2014 30.1  25.1 - 34.0 pg Final  . MCHC 01/24/2014 34.0  31.5 - 36.0 g/dL Final  . RBC 01/24/2014 3.72  3.70 - 5.45 10e6/uL Final  . RDW 01/24/2014 16.4* 11.2 - 14.5 % Final  . lymph# 01/24/2014 1.3  0.9 - 3.3 10e3/uL Final  . MONO# 01/24/2014 1.2* 0.1 - 0.9 10e3/uL Final  . Eosinophils Absolute 01/24/2014 0.0  0.0 - 0.5 10e3/uL Final  . Basophils Absolute 01/24/2014 0.0  0.0 - 0.1 10e3/uL Final  . NEUT% 01/24/2014 81.1* 38.4 - 76.8 % Final  . LYMPH% 01/24/2014 9.9* 14.0 - 49.7 % Final  . MONO% 01/24/2014 8.7  0.0 - 14.0 % Final  . EOS% 01/24/2014 0.1  0.0 - 7.0 % Final  . BASO% 01/24/2014 0.2  0.0 - 2.0 % Final  . Sodium 01/24/2014 136  136 - 145 mEq/L Final  . Potassium 01/24/2014 3.6  3.5 - 5.1 mEq/L Final  . Chloride 01/24/2014 98  98 - 109 mEq/L Final  . CO2 01/24/2014 25  22 - 29 mEq/L Final  . Glucose 01/24/2014 111  70 - 140 mg/dl Final  . BUN 01/24/2014 13.1  7.0 - 26.0 mg/dL Final  . Creatinine 01/24/2014 0.9  0.6 - 1.1 mg/dL Final  . Total Bilirubin 01/24/2014 0.25  0.20 - 1.20 mg/dL Final  . Alkaline Phosphatase 01/24/2014 79  40 - 150 U/L Final  . AST 01/24/2014 21  5 - 34 U/L Final  . ALT 01/24/2014 20  0 - 55 U/L Final  . Total Protein 01/24/2014 6.6  6.4 - 8.3 g/dL Final  . Albumin 01/24/2014 3.4* 3.5 - 5.0 g/dL Final  . Calcium 01/24/2014 9.0  8.4 - 10.4 mg/dL Final  . Anion Gap 01/24/2014 14* 3 - 11 mEq/L Final  . EGFR 01/24/2014 65* >90 ml/min/1.73 m2 Final   eGFR is calculated using the CKD-EPI Creatinine Equation (2009)  Appointment on 01/22/2014  Component Date Value Ref Range Status  . WBC 01/22/2014 1.9* 3.9 - 10.3 10e3/uL Final  . NEUT#  01/22/2014 0.8* 1.5 - 6.5 10e3/uL Final  . HGB 01/22/2014 11.4* 11.6 - 15.9 g/dL Final  . HCT 01/22/2014 33.6* 34.8 - 46.6 % Final  . Platelets 01/22/2014 267  145 - 400 10e3/uL Final  . MCV 01/22/2014 88.4  79.5 - 101.0 fL Final  . MCH 01/22/2014 30.0  25.1 - 34.0 pg Final  . MCHC 01/22/2014 33.9  31.5 - 36.0 g/dL Final  . RBC 01/22/2014 3.80  3.70 - 5.45 10e6/uL Final  . RDW 01/22/2014 15.5* 11.2 - 14.5 % Final  . lymph# 01/22/2014 0.7* 0.9 - 3.3 10e3/uL Final  . MONO# 01/22/2014 0.4  0.1 - 0.9 10e3/uL Final  . Eosinophils Absolute 01/22/2014 0.0  0.0 - 0.5 10e3/uL Final  . Basophils Absolute 01/22/2014 0.0  0.0 - 0.1 10e3/uL Final  . NEUT% 01/22/2014 39.3  38.4 - 76.8 % Final  . LYMPH% 01/22/2014 37.7  14.0 - 49.7 % Final  . MONO% 01/22/2014 19.9* 0.0 - 14.0 % Final  . EOS% 01/22/2014 1.0  0.0 - 7.0 % Final  . BASO% 01/22/2014 2.1* 0.0 - 2.0 % Final  . Sodium 01/22/2014 137  136 - 145 mEq/L Final  . Potassium 01/22/2014 3.8  3.5 - 5.1 mEq/L Final  . Chloride 01/22/2014 98  98 - 109 mEq/L Final  . CO2 01/22/2014 28  22 - 29 mEq/L Final  . Glucose 01/22/2014 111  70 - 140 mg/dl Final  . BUN 01/22/2014 13.9  7.0 - 26.0 mg/dL Final  . Creatinine 01/22/2014 0.9  0.6 - 1.1 mg/dL Final  . Total Bilirubin 01/22/2014 0.33  0.20 - 1.20 mg/dL Final  . Alkaline Phosphatase 01/22/2014 69  40 - 150 U/L Final  . AST 01/22/2014 17  5 - 34 U/L Final  . ALT 01/22/2014 24  0 - 55 U/L Final  . Total Protein 01/22/2014 6.6  6.4 - 8.3 g/dL Final  . Albumin 01/22/2014 3.6  3.5 - 5.0 g/dL Final  . Calcium 01/22/2014 8.6  8.4 - 10.4 mg/dL Final  . Anion Gap 01/22/2014 11  3 - 11 mEq/L Final  . EGFR 01/22/2014 64* >90 ml/min/1.73 m2 Final   eGFR is calculated using the CKD-EPI Creatinine Equation (2009)  . Technologist Review 01/22/2014 [place] and Myelocytes present   Final     RADIOGRAPHIC STUDIES: No results found.  ASSESSMENT/PLAN:    Bronchitis Patient was diagnosed with probable to  bronchitis following complaint of increased, productive cough and intermittent fevers.  She initiated Levaquin therapy on Wednesday, 01/22/2014.  She states that her fever did break just yesterday; and her temperature last night was 98.0.  Today she woke up and her temperature was back up to 101.5; but currently temperature is 100.0.  Patient with no acute cough; and no acute respiratory distress on exam today.  Advised patient to continue to take the Levaquin on a daily basis as previously  directed.  Also advised both patient and her husband to call and go directly to the emergency department for any worsening symptoms over the weekend whatsoever.  Advised the patient to return to the [facility] on Monday 01/27/2014 if her symptoms do not improve.   Chemotherapy induced neutropenia Patient was found to be neutropenic with an ANC of 0.8 this past Wednesday, 01/22/2014.  Patient did receive a Granix injection on Wednesday, 01/22/2014; and ANC is currently 10.7.  Will continue to monitor closely.  Insomnia Patient continues to complain of chronic insomnia issues.  Upon further discussion-it does appear that patient may very well have her days and nights mixed up.  She says she sleeps most of every day; and is awakened every hour at night.  She was recently prescribed Restoril to try for at bedtime; but states that it has not been very helpful.  She states that she has tried Valium in the past; but it did not help either.  Advised patient to become more active throughout the day some: And perhaps this will help with sleep at night.  Stage IV squamous cell carcinoma of left lung Cycle 2, day 15 of the Abraxane only portion of patient's carboplatin/Abraxane chemotherapy was held this past Wednesday, 01/22/2014 due to neutropenia.  Patient received Granix 480 g subcutaneous at that time; and ANC has improved to 10.7.  Patient has plans to return on 01/29/2014 for her next  chemotherapy.  Weakness Patient is complaining of some mild increased generalized weakness and fatigue.  She does appear slightly shaky today as well.  Most likely, this is both a chemotherapy side effect; as well as a symptom of patient's bronchitis infection.  Patient was encouraged to push fluids and remain as active as possible.  Patient stated understanding of all instructions; and was in agreement with this plan of care. The patient knows to call the clinic with any problems, questions or concerns.   Review/collaboration with Dr. [name] regarding all aspects of patient's visit today.   Total time spent with patient was 25 minutes;  with greater than 75 percent of that time spent in face to face counseling regarding her symptoms.  and coordination of care and follow up.  Disclaimer: This note was dictated with voice recognition software. Similar sounding words can inadvertently be transcribed and may not be corrected upon review.   Drue Second, NP 01/24/2014

## 2014-01-24 NOTE — Assessment & Plan Note (Signed)
Patient continues to complain of chronic insomnia issues.  Upon further discussion-it does appear that patient may very well have her days and nights mixed up.  She says she sleeps most of every day; and is awakened every hour at night.  She was recently prescribed Restoril to try for at bedtime; but states that it has not been very helpful.  She states that she has tried Valium in the past; but it did not help either.  Advised patient to become more active throughout the day some: And perhaps this will help with sleep at night.

## 2014-01-24 NOTE — Telephone Encounter (Signed)
   Provider input needed: FEVER 101 WITH CHILLS   Reason for call: AS ABOVE  Respiratory: positive for cough, sputum and HAS LIGHT YELLOW TO GREEN COLOR.   ALLERGIES:  has No Known Allergies.  Patient last received chemotherapy/ treatment on 01/15/14.  Patient was last seen in the office on 01/22/14.  Next appt is 02/05/14.  Is patient having fevers greater than 100.5?  yes   Is patient having uncontrolled pain, or new pain? no   Is patient having new back pain that changes with position (worsens or eases when laying down?)  no   Is patient able to eat and drink? yes    Is patient able to pass stool without difficulty?   N/A     Is patient having uncontrolled nausea?  No     patient calls 01/24/2014 with complaint of FEVER 101 AND CHILLS  Summary Based on the above information advised patient to  AWAIT A RETURN CALL.   Waverly Ferrari M  01/24/2014, 10:59 AM   Background Info  Tammie Clay   DOB: Jun 28, 1945   MR#: [id number]   CSN#   [id number] 01/24/2014

## 2014-01-24 NOTE — Assessment & Plan Note (Signed)
Cycle 2, day 15 of the Abraxane only portion of patient's carboplatin/Abraxane chemotherapy was held this past Wednesday, 01/22/2014 due to neutropenia.  Patient received Granix 480 g subcutaneous at that time; and ANC has improved to 10.7.  Patient has plans to return on 01/29/2014 for her next chemotherapy.

## 2014-01-24 NOTE — Assessment & Plan Note (Signed)
Patient was diagnosed with probable to bronchitis following complaint of increased, productive cough and intermittent fevers.  She initiated Levaquin therapy on Wednesday, 01/22/2014.  She states that her fever did break just yesterday; and her temperature last night was 98.0.  Today she woke up and her temperature was back up to 101.5; but currently temperature is 100.0.  Patient with no acute cough; and no acute respiratory distress on exam today.  Advised patient to continue to take the Levaquin on a daily basis as previously directed.  Also advised both patient and her husband to call and go directly to the emergency department for any worsening symptoms over the weekend whatsoever.  Advised the patient to return to the [facility] on Monday 01/27/2014 if her symptoms do not improve.

## 2014-01-24 NOTE — Telephone Encounter (Signed)
VERBAL ORDER AND READ BACK TO CINDEE BACON,NP- PT. TO COME FOR LABS AND WAIT FOR RESULTS. NOTIFIED PT. SHE WILL ARRIVE IN THIRTY TO FORTY-FIVE MINUTES.

## 2014-01-27 ENCOUNTER — Telehealth: Payer: Self-pay | Admitting: *Deleted

## 2014-01-27 NOTE — Telephone Encounter (Signed)
-----   Message from Drue Second, NP sent at 01/24/2014  1:17 PM EST ----- PROVIDER:  [facility] Triage: follow up call 24-48 hours please.

## 2014-01-27 NOTE — Telephone Encounter (Signed)
Called patient to follow up on office visit from 12/18. Pt states she is "85%" better. Fever "broke over weekend" and feels much better. Has lab/chemo on 12/23. Pt to call if any problems.

## 2014-01-29 ENCOUNTER — Ambulatory Visit (HOSPITAL_BASED_OUTPATIENT_CLINIC_OR_DEPARTMENT_OTHER): Payer: Medicare Other

## 2014-01-29 ENCOUNTER — Ambulatory Visit (HOSPITAL_BASED_OUTPATIENT_CLINIC_OR_DEPARTMENT_OTHER): Payer: Medicare Other | Admitting: Lab

## 2014-01-29 DIAGNOSIS — Z5111 Encounter for antineoplastic chemotherapy: Secondary | ICD-10-CM

## 2014-01-29 DIAGNOSIS — C3492 Malignant neoplasm of unspecified part of left bronchus or lung: Secondary | ICD-10-CM

## 2014-01-29 DIAGNOSIS — C3412 Malignant neoplasm of upper lobe, left bronchus or lung: Secondary | ICD-10-CM

## 2014-01-29 LAB — CBC WITH DIFFERENTIAL/PLATELET
BASO%: 0.6 % (ref 0.0–2.0)
Basophils Absolute: 0.1 10*3/uL (ref 0.0–0.1)
EOS%: 0.2 % (ref 0.0–7.0)
Eosinophils Absolute: 0 10*3/uL (ref 0.0–0.5)
HCT: 35.2 % (ref 34.8–46.6)
HGB: 11.9 g/dL (ref 11.6–15.9)
LYMPH#: 1.7 10*3/uL (ref 0.9–3.3)
LYMPH%: 20.7 % (ref 14.0–49.7)
MCH: 30.1 pg (ref 25.1–34.0)
MCHC: 33.8 g/dL (ref 31.5–36.0)
MCV: 88.9 fL (ref 79.5–101.0)
MONO#: 0.8 10*3/uL (ref 0.1–0.9)
MONO%: 9.9 % (ref 0.0–14.0)
NEUT#: 5.7 10*3/uL (ref 1.5–6.5)
NEUT%: 68.6 % (ref 38.4–76.8)
PLATELETS: 227 10*3/uL (ref 145–400)
RBC: 3.96 10*6/uL (ref 3.70–5.45)
RDW: 16.7 % — ABNORMAL HIGH (ref 11.2–14.5)
WBC: 8.4 10*3/uL (ref 3.9–10.3)

## 2014-01-29 LAB — COMPREHENSIVE METABOLIC PANEL (CC13)
ALT: 22 U/L (ref 0–55)
AST: 19 U/L (ref 5–34)
Albumin: 3.6 g/dL (ref 3.5–5.0)
Alkaline Phosphatase: 76 U/L (ref 40–150)
Anion Gap: 12 mEq/L — ABNORMAL HIGH (ref 3–11)
BILIRUBIN TOTAL: 0.46 mg/dL (ref 0.20–1.20)
BUN: 16.3 mg/dL (ref 7.0–26.0)
CO2: 27 meq/L (ref 22–29)
CREATININE: 1 mg/dL (ref 0.6–1.1)
Calcium: 10.2 mg/dL (ref 8.4–10.4)
Chloride: 98 mEq/L (ref 98–109)
EGFR: 57 mL/min/{1.73_m2} — ABNORMAL LOW (ref >90)
Glucose: 136 mg/dl (ref 70–140)
Potassium: 4.1 mEq/L (ref 3.5–5.1)
Sodium: 137 mEq/L (ref 136–145)
Total Protein: 7.4 g/dL (ref 6.4–8.3)

## 2014-01-29 MED ORDER — ONDANSETRON 16 MG/50ML IVPB (CHCC)
INTRAVENOUS | Status: AC
  Filled 2014-01-29: qty 16

## 2014-01-29 MED ORDER — HEPARIN SOD (PORK) LOCK FLUSH 100 UNIT/ML IV SOLN
500.0000 [IU] | Freq: Once | INTRAVENOUS | Status: AC | PRN
  Administered 2014-01-29: 500 [IU]
  Filled 2014-01-29: qty 5

## 2014-01-29 MED ORDER — SODIUM CHLORIDE 0.9 % IJ SOLN
10.0000 mL | INTRAMUSCULAR | Status: DC | PRN
  Administered 2014-01-29: 10 mL
  Filled 2014-01-29: qty 10

## 2014-01-29 MED ORDER — ONDANSETRON 16 MG/50ML IVPB (CHCC)
16.0000 mg | Freq: Once | INTRAVENOUS | Status: AC
  Administered 2014-01-29: 16 mg via INTRAVENOUS

## 2014-01-29 MED ORDER — DEXAMETHASONE SODIUM PHOSPHATE 20 MG/5ML IJ SOLN
20.0000 mg | Freq: Once | INTRAMUSCULAR | Status: AC
  Administered 2014-01-29: 20 mg via INTRAVENOUS

## 2014-01-29 MED ORDER — PACLITAXEL PROTEIN-BOUND CHEMO INJECTION 100 MG
100.0000 mg/m2 | Freq: Once | INTRAVENOUS | Status: AC
  Administered 2014-01-29: 175 mg via INTRAVENOUS
  Filled 2014-01-29: qty 35

## 2014-01-29 MED ORDER — ONDANSETRON 8 MG/NS 50 ML IVPB
INTRAVENOUS | Status: AC
  Filled 2014-01-29: qty 8

## 2014-01-29 MED ORDER — DEXAMETHASONE SODIUM PHOSPHATE 20 MG/5ML IJ SOLN
INTRAMUSCULAR | Status: AC
  Filled 2014-01-29: qty 5

## 2014-01-29 MED ORDER — SODIUM CHLORIDE 0.9 % IV SOLN
Freq: Once | INTRAVENOUS | Status: AC
Start: 2014-01-29 — End: 2014-01-29
  Administered 2014-01-29: 11:00:00 via INTRAVENOUS

## 2014-01-29 MED ORDER — SODIUM CHLORIDE 0.9 % IV SOLN
450.0000 mg | Freq: Once | INTRAVENOUS | Status: AC
  Administered 2014-01-29: 450 mg via INTRAVENOUS
  Filled 2014-01-29: qty 45

## 2014-01-29 NOTE — Patient Instructions (Signed)
[facility] Discharge Instructions for Patients Receiving Chemotherapy  Today you received the following chemotherapy agents abraxane/carboplatin  To help prevent nausea and vomiting after your treatment, we encourage you to take your nausea medication as directed   If you develop nausea and vomiting that is not controlled by your nausea medication, call the clinic.   BELOW ARE SYMPTOMS THAT SHOULD BE REPORTED IMMEDIATELY:  *FEVER GREATER THAN 100.5 F  *CHILLS WITH OR WITHOUT FEVER  NAUSEA AND VOMITING THAT IS NOT CONTROLLED WITH YOUR NAUSEA MEDICATION  *UNUSUAL SHORTNESS OF BREATH  *UNUSUAL BRUISING OR BLEEDING  TENDERNESS IN MOUTH AND THROAT WITH OR WITHOUT PRESENCE OF ULCERS  *URINARY PROBLEMS  *BOWEL PROBLEMS  UNUSUAL RASH Items with * indicate a potential emergency and should be followed up as soon as possible.  Feel free to call the clinic you have any questions or concerns. The clinic phone number is [phone].

## 2014-02-05 ENCOUNTER — Other Ambulatory Visit (HOSPITAL_BASED_OUTPATIENT_CLINIC_OR_DEPARTMENT_OTHER): Payer: Medicare Other

## 2014-02-05 ENCOUNTER — Ambulatory Visit (HOSPITAL_BASED_OUTPATIENT_CLINIC_OR_DEPARTMENT_OTHER): Payer: Medicare Other

## 2014-02-05 ENCOUNTER — Encounter: Payer: Self-pay | Admitting: Physician Assistant

## 2014-02-05 ENCOUNTER — Ambulatory Visit (HOSPITAL_BASED_OUTPATIENT_CLINIC_OR_DEPARTMENT_OTHER): Payer: Medicare Other | Admitting: Physician Assistant

## 2014-02-05 VITALS — BP 136/75 | HR 86 | Temp 98.3°F | Resp 18 | Ht 62.0 in | Wt 169.5 lb

## 2014-02-05 DIAGNOSIS — C3412 Malignant neoplasm of upper lobe, left bronchus or lung: Secondary | ICD-10-CM

## 2014-02-05 DIAGNOSIS — C3492 Malignant neoplasm of unspecified part of left bronchus or lung: Secondary | ICD-10-CM

## 2014-02-05 DIAGNOSIS — Z5111 Encounter for antineoplastic chemotherapy: Secondary | ICD-10-CM

## 2014-02-05 LAB — COMPREHENSIVE METABOLIC PANEL (CC13)
ALBUMIN: 3.6 g/dL (ref 3.5–5.0)
ALT: 20 U/L (ref 0–55)
ANION GAP: 8 meq/L (ref 3–11)
AST: 19 U/L (ref 5–34)
Alkaline Phosphatase: 66 U/L (ref 40–150)
BUN: 20.7 mg/dL (ref 7.0–26.0)
CHLORIDE: 101 meq/L (ref 98–109)
CO2: 30 meq/L — AB (ref 22–29)
Calcium: 8.9 mg/dL (ref 8.4–10.4)
Creatinine: 0.8 mg/dL (ref 0.6–1.1)
EGFR: 71 mL/min/{1.73_m2} — ABNORMAL LOW (ref >90)
Glucose: 110 mg/dl (ref 70–140)
Potassium: 4.2 mEq/L (ref 3.5–5.1)
Sodium: 138 mEq/L (ref 136–145)
Total Bilirubin: 0.49 mg/dL (ref 0.20–1.20)
Total Protein: 6.8 g/dL (ref 6.4–8.3)

## 2014-02-05 LAB — CBC WITH DIFFERENTIAL/PLATELET
BASO%: 0.8 % (ref 0.0–2.0)
Basophils Absolute: 0 10*3/uL (ref 0.0–0.1)
EOS%: 0.5 % (ref 0.0–7.0)
Eosinophils Absolute: 0 10*3/uL (ref 0.0–0.5)
HCT: 33.6 % — ABNORMAL LOW (ref 34.8–46.6)
HGB: 10.9 g/dL — ABNORMAL LOW (ref 11.6–15.9)
LYMPH#: 1.5 10*3/uL (ref 0.9–3.3)
LYMPH%: 30.5 % (ref 14.0–49.7)
MCH: 29.7 pg (ref 25.1–34.0)
MCHC: 32.6 g/dL (ref 31.5–36.0)
MCV: 91.1 fL (ref 79.5–101.0)
MONO#: 0.5 10*3/uL (ref 0.1–0.9)
MONO%: 10.8 % (ref 0.0–14.0)
NEUT#: 2.7 10*3/uL (ref 1.5–6.5)
NEUT%: 57.4 % (ref 38.4–76.8)
Platelets: 303 10*3/uL (ref 145–400)
RBC: 3.69 10*6/uL — ABNORMAL LOW (ref 3.70–5.45)
RDW: 16.7 % — AB (ref 11.2–14.5)
WBC: 4.8 10*3/uL (ref 3.9–10.3)

## 2014-02-05 MED ORDER — HEPARIN SOD (PORK) LOCK FLUSH 100 UNIT/ML IV SOLN
500.0000 [IU] | Freq: Once | INTRAVENOUS | Status: AC | PRN
  Administered 2014-02-05: 500 [IU]
  Filled 2014-02-05: qty 5

## 2014-02-05 MED ORDER — ONDANSETRON 8 MG/NS 50 ML IVPB
INTRAVENOUS | Status: AC
  Filled 2014-02-05: qty 8

## 2014-02-05 MED ORDER — SODIUM CHLORIDE 0.9 % IV SOLN
Freq: Once | INTRAVENOUS | Status: AC
  Administered 2014-02-05: 14:00:00 via INTRAVENOUS

## 2014-02-05 MED ORDER — DEXAMETHASONE SODIUM PHOSPHATE 10 MG/ML IJ SOLN
10.0000 mg | Freq: Once | INTRAMUSCULAR | Status: AC
  Administered 2014-02-05: 10 mg via INTRAVENOUS

## 2014-02-05 MED ORDER — SODIUM CHLORIDE 0.9 % IJ SOLN
10.0000 mL | INTRAMUSCULAR | Status: DC | PRN
  Administered 2014-02-05: 10 mL
  Filled 2014-02-05: qty 10

## 2014-02-05 MED ORDER — ONDANSETRON 8 MG/50ML IVPB (CHCC)
8.0000 mg | Freq: Once | INTRAVENOUS | Status: AC
  Administered 2014-02-05: 8 mg via INTRAVENOUS

## 2014-02-05 MED ORDER — PACLITAXEL PROTEIN-BOUND CHEMO INJECTION 100 MG
100.0000 mg/m2 | Freq: Once | INTRAVENOUS | Status: AC
  Administered 2014-02-05: 175 mg via INTRAVENOUS
  Filled 2014-02-05: qty 35

## 2014-02-05 MED ORDER — DEXAMETHASONE SODIUM PHOSPHATE 10 MG/ML IJ SOLN
INTRAMUSCULAR | Status: AC
  Filled 2014-02-05: qty 1

## 2014-02-05 NOTE — Progress Notes (Addendum)
[facility] [phone]   Fax:(336) [place] [address] [place] [address]  DIAGNOSIS: Stage IV (T2, N2, M1a) non-small cell lung cancer, squamous cell carcinoma presented with central left upper lobe lung mass with associated postobstructive atelectasis as well as ipsilateral and subcarinal lymphadenopathy as well as pleural tumor spread diagnosed in October 2015.  PRIOR THERAPY: None.  CURRENT THERAPY: Systemic chemotherapy with carboplatin for AUC of 5 on day 1 and Abraxane 100 MG/M2 on days 1, 8 and 15 every 3 weeks. Status post 2 cycles, as well as day 1 of cycle #3.   INTERVAL HISTORY: Tammie Clay 68 y.o. female returns to the clinic today for follow-up visit accompanied by her significant other. The patient is feeling fine today with no specific complaints except for fatigue.  She is tolerating her current systemic chemotherapy with carboplatin and Abraxane fairly well. The patient denied having any significant chest pain, shortness of breath or hemoptysis. She has no nausea or vomiting. She denied having any significant weight loss or night sweats. She presents to start day 8 of cycle #3.   MEDICAL HISTORY: Past Medical History  Diagnosis Date  . Hypertension   . COPD (chronic obstructive pulmonary disease)   . Hyperlipidemia   . GERD (gastroesophageal reflux disease)   . Osteopenia   . Insomnia   . Mediastinal lymphadenopathy 11/19/13    PER CT  . Multiple lung nodules on CT 11/19/13    LEFT LUNG BASE  . Lung mass 11/19/13    RUL PER CT  . Coronary artery calcification seen on CAT scan 11/21/2013  . COPD (chronic obstructive pulmonary disease) 11/21/2013  . Pneumonia     hx of  . Anxiety     ALLERGIES:  has No Known Allergies.  MEDICATIONS:  Current Outpatient Prescriptions  Medication Sig Dispense Refill  . albuterol (PROVENTIL HFA;VENTOLIN HFA) 108 (90 BASE)  MCG/ACT inhaler Inhale 2 puffs into the lungs every 6 (six) hours as needed for wheezing.    . diazepam (VALIUM) 5 MG tablet Take 5 mg by mouth at bedtime.    . DULoxetine (CYMBALTA) 30 MG capsule Take 1 capsule (30 mg total) by mouth daily. 30 capsule 2  . HYDROcodone-acetaminophen (NORCO/VICODIN) 5-325 MG per tablet Take 1-2 tablets by mouth every 6 (six) hours as needed for moderate pain.    Marland Kitchen lidocaine-prilocaine (EMLA) cream Apply 1 application topically as needed. 30 g 0  . lisinopril-hydrochlorothiazide (PRINZIDE,ZESTORETIC) 20-12.5 MG per tablet Take 2 tablets by mouth every morning.     . metoprolol succinate (TOPROL-XL) 50 MG 24 hr tablet Take 50 mg by mouth every morning. Take with or immediately following a meal.    . Multiple Vitamin (MULTIVITAMIN WITH MINERALS) TABS Take 1 tablet by mouth daily.    . prochlorperazine (COMPAZINE) 10 MG tablet Take 1 tablet (10 mg total) by mouth every 6 (six) hours as needed for nausea or vomiting. 30 tablet 0  . tiotropium (SPIRIVA) 18 MCG inhalation capsule Place 18 mcg into inhaler and inhale daily.    . temazepam (RESTORIL) 30 MG capsule Take 1 capsule (30 mg total) by mouth at bedtime as needed for sleep. (Patient not taking: Reported on 02/05/2014) 30 capsule 0   No current facility-administered medications for this visit.   Facility-Administered Medications Ordered in Other Visits  Medication Dose Route Frequency Provider Last Rate Last Dose  . heparin lock flush  100 unit/mL  500 Units Intracatheter Once PRN Curt Bears, MD      . PACLitaxel-protein bound (ABRAXANE) chemo infusion 175 mg  100 mg/m2 (Treatment Plan Actual) Intravenous Once Curt Bears, MD      . sodium chloride 0.9 % injection 10 mL  10 mL Intracatheter PRN Curt Bears, MD        SURGICAL HISTORY:  Past Surgical History  Procedure Laterality Date  .  c section x 2    . Urethral cyst removed    . Tonsillectomy    . Appendectomy    . Colonoscopy with propofol  N/A 07/17/2012    Procedure: COLONOSCOPY WITH PROPOFOL;  Surgeon: Garlan Fair, MD;  Location: [facility];  Service: Endoscopy;  Laterality: N/A;  . Esophagogastroduodenoscopy (egd) with propofol N/A 07/17/2012    Procedure: ESOPHAGOGASTRODUODENOSCOPY (EGD) WITH PROPOFOL;  Surgeon: Garlan Fair, MD;  Location: [facility];  Service: Endoscopy;  Laterality: N/A;  . Video bronchoscopy with endobronchial ultrasound N/A 12/03/2013    Procedure: VIDEO BRONCHOSCOPY WITH ENDOBRONCHIAL ULTRASOUND WITH BIOPSIES;  Surgeon: Grace Isaac, MD;  Location: [facility];  Service: Thoracic;  Laterality: N/A;  . Portacath placement Left 12/10/2013    Procedure: INSERTION PORT-A-CATH;  Surgeon: Grace Isaac, MD;  Location: [facility];  Service: Thoracic;  Laterality: Left;    REVIEW OF SYSTEMS:  Constitutional: positive for fatigue Eyes: negative Ears, nose, mouth, throat, and face: negative Respiratory: positive for cough Cardiovascular: negative Gastrointestinal: negative Genitourinary:negative Integument/breast: negative Hematologic/lymphatic: negative Musculoskeletal:negative Neurological: negative Behavioral/Psych: negative Endocrine: negative Allergic/Immunologic: negative   PHYSICAL EXAMINATION: General appearance: alert, cooperative, fatigued and no distress Head: Normocephalic, without obvious abnormality, atraumatic Neck: no adenopathy, no JVD, supple, symmetrical, trachea midline and thyroid not enlarged, symmetric, no tenderness/mass/nodules Lymph nodes: Cervical, supraclavicular, and axillary nodes normal. Resp: clear to auscultation bilaterally Back: symmetric, no curvature. ROM normal. No CVA tenderness. Cardio: regular rate and rhythm, S1, S2 normal, no murmur, click, rub or gallop GI: soft, non-tender; bowel sounds normal; no masses,  no organomegaly Genitalia: defer exam Extremities: extremities normal, atraumatic, no cyanosis or edema Neurologic: Alert and oriented X 3,  normal strength and tone. Normal symmetric reflexes. Normal coordination and gait  ECOG PERFORMANCE STATUS: 1 - Symptomatic but completely ambulatory  Blood pressure 136/75, pulse 86, temperature 98.3 F (36.8 C), temperature source Oral, resp. rate 18, height 5\' 2"  (1.575 m), weight 169 lb 8 oz (76.885 kg), SpO2 100 %.  LABORATORY DATA: Lab Results  Component Value Date   WBC 4.8 02/05/2014   HGB 10.9* 02/05/2014   HCT 33.6* 02/05/2014   MCV 91.1 02/05/2014   PLT 303 02/05/2014      Chemistry      Component Value Date/Time   NA 138 02/05/2014 1302   NA 138 12/10/2013 0637   K 4.2 02/05/2014 1302   K 4.7 12/10/2013 0637   CL 99 12/10/2013 0637   CO2 30* 02/05/2014 1302   CO2 28 12/10/2013 0637   BUN 20.7 02/05/2014 1302   BUN 22 12/10/2013 0637   CREATININE 0.8 02/05/2014 1302   CREATININE 0.75 12/10/2013 0637   CREATININE 0.80 11/22/2013 1054      Component Value Date/Time   CALCIUM 8.9 02/05/2014 1302   CALCIUM 9.8 12/10/2013 0637   ALKPHOS 66 02/05/2014 1302   ALKPHOS 77 12/10/2013 0637   AST 19 02/05/2014 1302   AST 16 12/10/2013 0637   ALT 20 02/05/2014 1302   ALT 16 12/10/2013 0637   BILITOT 0.49  02/05/2014 1302   BILITOT 0.4 12/10/2013 7793       RADIOGRAPHIC STUDIES:  ASSESSMENT AND PLAN: This is a very pleasant 68 years old white female recently diagnosed with:  1) Stage IV non-small cell lung cancer, squamous cell carcinoma currently undergoing systemic chemotherapy with carboplatin and Abraxane status post 2 cycles, as well as day 1 of cycle #3. She is tolerating her treatment fairly well with no significant adverse effects except for fatigue. The patient was discussed with an also seen by Dr. [name]. She'll proceed with day 8 of cycle #3 today as scheduled. She will complete cycle #3 of schedule as long as her counts are within treat will range. She'll follow-up in 2 weeks prior to the start of cycle #4 with a restaging CT scan of her chest, abdomen  and pelvis with contrast to reevaluate her disease.  2) chemotherapy-induced neutropenia: resolved  3) fever and questionable acute bronchitis: resolved  The patient voices understanding of current disease status and treatment options and is in agreement with the current care plan.  All questions were answered. The patient knows to call the clinic with any problems, questions or concerns. We can certainly see the patient much sooner if necessary.  Carlton Adam PA-C  ADDENDUM: Hematology/Oncology Attending: I had a face to face encounter with the patient. I recommended her care plan. This is a very pleasant 68 years old white female recently diagnosed with metastatic non-small cell lung cancer, squamous cell carcinoma. She is currently undergoing systemic chemotherapy with carboplatin and Abraxane status post 2 cycles and she is currently undergoing cycle #3. The patient is tolerating her treatment fairly well with no significant adverse effects. She noticed significant improvement in her general condition except for mild fatigue. I recommended for her to complete cycle #3 as a scheduled. The patient would come back for follow-up visit in 2 weeks for reevaluation after repeating CT scan of the chest, abdomen and pelvis for restaging of her disease. She was advised to call immediately if she has any concerning symptoms in the interval.  Disclaimer: This note was dictated with voice recognition software. Similar sounding words can inadvertently be transcribed and may not be corrected upon review. Eilleen Kempf., MD 02/08/2014

## 2014-02-05 NOTE — Patient Instructions (Signed)
[facility] Discharge Instructions for Patients Receiving Chemotherapy  Today you received the following chemotherapy agents abraxane  To help prevent nausea and vomiting after your treatment, we encourage you to take your nausea medication as directed   If you develop nausea and vomiting that is not controlled by your nausea medication, call the clinic.   BELOW ARE SYMPTOMS THAT SHOULD BE REPORTED IMMEDIATELY:  *FEVER GREATER THAN 100.5 F  *CHILLS WITH OR WITHOUT FEVER  NAUSEA AND VOMITING THAT IS NOT CONTROLLED WITH YOUR NAUSEA MEDICATION  *UNUSUAL SHORTNESS OF BREATH  *UNUSUAL BRUISING OR BLEEDING  TENDERNESS IN MOUTH AND THROAT WITH OR WITHOUT PRESENCE OF ULCERS  *URINARY PROBLEMS  *BOWEL PROBLEMS  UNUSUAL RASH Items with * indicate a potential emergency and should be followed up as soon as possible.  Feel free to call the clinic you have any questions or concerns. The clinic phone number is [phone].

## 2014-02-06 NOTE — Patient Instructions (Signed)
Continue labs and chemotherapy is scheduled Follow-up in 2 weeks with a restaging CT scan of the chest, abdomen and pelvis to reevaluate your disease

## 2014-02-12 ENCOUNTER — Other Ambulatory Visit (HOSPITAL_BASED_OUTPATIENT_CLINIC_OR_DEPARTMENT_OTHER): Payer: Medicare Other

## 2014-02-12 ENCOUNTER — Ambulatory Visit (HOSPITAL_BASED_OUTPATIENT_CLINIC_OR_DEPARTMENT_OTHER): Payer: Medicare Other

## 2014-02-12 DIAGNOSIS — C782 Secondary malignant neoplasm of pleura: Secondary | ICD-10-CM

## 2014-02-12 DIAGNOSIS — C3412 Malignant neoplasm of upper lobe, left bronchus or lung: Secondary | ICD-10-CM

## 2014-02-12 DIAGNOSIS — C3492 Malignant neoplasm of unspecified part of left bronchus or lung: Secondary | ICD-10-CM

## 2014-02-12 DIAGNOSIS — Z5111 Encounter for antineoplastic chemotherapy: Secondary | ICD-10-CM

## 2014-02-12 LAB — COMPREHENSIVE METABOLIC PANEL (CC13)
ALT: 20 U/L (ref 0–55)
ANION GAP: 9 meq/L (ref 3–11)
AST: 19 U/L (ref 5–34)
Albumin: 3.6 g/dL (ref 3.5–5.0)
Alkaline Phosphatase: 73 U/L (ref 40–150)
BUN: 20.1 mg/dL (ref 7.0–26.0)
CALCIUM: 8.8 mg/dL (ref 8.4–10.4)
CO2: 28 meq/L (ref 22–29)
CREATININE: 0.8 mg/dL (ref 0.6–1.1)
Chloride: 104 mEq/L (ref 98–109)
EGFR: 77 mL/min/{1.73_m2} — AB (ref >90)
Glucose: 111 mg/dl (ref 70–140)
POTASSIUM: 4 meq/L (ref 3.5–5.1)
Sodium: 141 mEq/L (ref 136–145)
TOTAL PROTEIN: 6.6 g/dL (ref 6.4–8.3)
Total Bilirubin: 0.26 mg/dL (ref 0.20–1.20)

## 2014-02-12 LAB — CBC WITH DIFFERENTIAL/PLATELET
BASO%: 0.8 % (ref 0.0–2.0)
Basophils Absolute: 0 10*3/uL (ref 0.0–0.1)
EOS ABS: 0 10*3/uL (ref 0.0–0.5)
EOS%: 1 % (ref 0.0–7.0)
HCT: 31.1 % — ABNORMAL LOW (ref 34.8–46.6)
HGB: 10.2 g/dL — ABNORMAL LOW (ref 11.6–15.9)
LYMPH#: 1.3 10*3/uL (ref 0.9–3.3)
LYMPH%: 36.8 % (ref 14.0–49.7)
MCH: 30.4 pg (ref 25.1–34.0)
MCHC: 32.9 g/dL (ref 31.5–36.0)
MCV: 92.4 fL (ref 79.5–101.0)
MONO#: 0.4 10*3/uL (ref 0.1–0.9)
MONO%: 11.2 % (ref 0.0–14.0)
NEUT#: 1.7 10*3/uL (ref 1.5–6.5)
NEUT%: 50.2 % (ref 38.4–76.8)
Platelets: 216 10*3/uL (ref 145–400)
RBC: 3.37 10*6/uL — AB (ref 3.70–5.45)
RDW: 18 % — AB (ref 11.2–14.5)
WBC: 3.4 10*3/uL — AB (ref 3.9–10.3)

## 2014-02-12 MED ORDER — ONDANSETRON 8 MG/NS 50 ML IVPB
INTRAVENOUS | Status: AC
  Filled 2014-02-12: qty 8

## 2014-02-12 MED ORDER — PACLITAXEL PROTEIN-BOUND CHEMO INJECTION 100 MG
100.0000 mg/m2 | Freq: Once | INTRAVENOUS | Status: AC
  Administered 2014-02-12: 175 mg via INTRAVENOUS
  Filled 2014-02-12: qty 35

## 2014-02-12 MED ORDER — DEXAMETHASONE SODIUM PHOSPHATE 10 MG/ML IJ SOLN
INTRAMUSCULAR | Status: AC
  Filled 2014-02-12: qty 1

## 2014-02-12 MED ORDER — SODIUM CHLORIDE 0.9 % IJ SOLN
10.0000 mL | INTRAMUSCULAR | Status: DC | PRN
  Administered 2014-02-12: 10 mL
  Filled 2014-02-12: qty 10

## 2014-02-12 MED ORDER — HEPARIN SOD (PORK) LOCK FLUSH 100 UNIT/ML IV SOLN
500.0000 [IU] | Freq: Once | INTRAVENOUS | Status: AC | PRN
  Administered 2014-02-12: 500 [IU]
  Filled 2014-02-12: qty 5

## 2014-02-12 MED ORDER — ONDANSETRON 8 MG/50ML IVPB (CHCC)
8.0000 mg | Freq: Once | INTRAVENOUS | Status: AC
  Administered 2014-02-12: 8 mg via INTRAVENOUS

## 2014-02-12 MED ORDER — DEXAMETHASONE SODIUM PHOSPHATE 10 MG/ML IJ SOLN
10.0000 mg | Freq: Once | INTRAMUSCULAR | Status: AC
  Administered 2014-02-12: 10 mg via INTRAVENOUS

## 2014-02-12 MED ORDER — SODIUM CHLORIDE 0.9 % IV SOLN
Freq: Once | INTRAVENOUS | Status: AC
  Administered 2014-02-12: 12:00:00 via INTRAVENOUS

## 2014-02-12 NOTE — Patient Instructions (Signed)
[facility] Discharge Instructions for Patients Receiving Chemotherapy  Today you received the following chemotherapy agents Abraxane.  To help prevent nausea and vomiting after your treatment, we encourage you to take your nausea medication as directed.    If you develop nausea and vomiting that is not controlled by your nausea medication, call the clinic.   BELOW ARE SYMPTOMS THAT SHOULD BE REPORTED IMMEDIATELY:  *FEVER GREATER THAN 100.5 F  *CHILLS WITH OR WITHOUT FEVER  NAUSEA AND VOMITING THAT IS NOT CONTROLLED WITH YOUR NAUSEA MEDICATION  *UNUSUAL SHORTNESS OF BREATH  *UNUSUAL BRUISING OR BLEEDING  TENDERNESS IN MOUTH AND THROAT WITH OR WITHOUT PRESENCE OF ULCERS  *URINARY PROBLEMS  *BOWEL PROBLEMS  UNUSUAL RASH Items with * indicate a potential emergency and should be followed up as soon as possible.  Feel free to call the clinic you have any questions or concerns. The clinic phone number is [phone].

## 2014-02-14 ENCOUNTER — Ambulatory Visit (HOSPITAL_COMMUNITY)
Admission: RE | Admit: 2014-02-14 | Discharge: 2014-02-14 | Disposition: A | Discharge status: Home / Self Care | Payer: Medicare Other | Source: Ambulatory Visit | Attending: Physician Assistant | Admitting: Physician Assistant

## 2014-02-14 DIAGNOSIS — C3492 Malignant neoplasm of unspecified part of left bronchus or lung: Secondary | ICD-10-CM | POA: Diagnosis not present

## 2014-02-14 DIAGNOSIS — Z79899 Other long term (current) drug therapy: Secondary | ICD-10-CM | POA: Diagnosis not present

## 2014-02-14 DIAGNOSIS — R0602 Shortness of breath: Secondary | ICD-10-CM | POA: Diagnosis not present

## 2014-02-14 MED ORDER — IOHEXOL 300 MG/ML  SOLN
100.0000 mL | Freq: Once | INTRAMUSCULAR | Status: AC | PRN
  Administered 2014-02-14: 100 mL via INTRAVENOUS

## 2014-02-19 ENCOUNTER — Ambulatory Visit (HOSPITAL_BASED_OUTPATIENT_CLINIC_OR_DEPARTMENT_OTHER): Payer: Medicare Other

## 2014-02-19 ENCOUNTER — Other Ambulatory Visit: Payer: Medicare Other

## 2014-02-19 ENCOUNTER — Ambulatory Visit (HOSPITAL_BASED_OUTPATIENT_CLINIC_OR_DEPARTMENT_OTHER): Payer: Medicare Other | Admitting: Internal Medicine

## 2014-02-19 ENCOUNTER — Encounter: Payer: Self-pay | Admitting: Internal Medicine

## 2014-02-19 ENCOUNTER — Telehealth: Payer: Self-pay | Admitting: Internal Medicine

## 2014-02-19 ENCOUNTER — Other Ambulatory Visit (HOSPITAL_BASED_OUTPATIENT_CLINIC_OR_DEPARTMENT_OTHER): Payer: Medicare Other

## 2014-02-19 VITALS — BP 120/99 | HR 94 | Temp 98.2°F | Resp 18 | Ht 62.0 in | Wt 172.2 lb

## 2014-02-19 DIAGNOSIS — C3412 Malignant neoplasm of upper lobe, left bronchus or lung: Secondary | ICD-10-CM

## 2014-02-19 DIAGNOSIS — D6481 Anemia due to antineoplastic chemotherapy: Secondary | ICD-10-CM

## 2014-02-19 DIAGNOSIS — Z5111 Encounter for antineoplastic chemotherapy: Secondary | ICD-10-CM

## 2014-02-19 DIAGNOSIS — T451X5A Adverse effect of antineoplastic and immunosuppressive drugs, initial encounter: Secondary | ICD-10-CM

## 2014-02-19 DIAGNOSIS — C3492 Malignant neoplasm of unspecified part of left bronchus or lung: Secondary | ICD-10-CM

## 2014-02-19 LAB — COMPREHENSIVE METABOLIC PANEL (CC13)
ALBUMIN: 3.7 g/dL (ref 3.5–5.0)
ALT: 26 U/L (ref 0–55)
ANION GAP: 9 meq/L (ref 3–11)
AST: 21 U/L (ref 5–34)
Alkaline Phosphatase: 78 U/L (ref 40–150)
BUN: 21.6 mg/dL (ref 7.0–26.0)
CHLORIDE: 101 meq/L (ref 98–109)
CO2: 28 meq/L (ref 22–29)
Calcium: 8.9 mg/dL (ref 8.4–10.4)
Creatinine: 1 mg/dL (ref 0.6–1.1)
EGFR: 60 mL/min/{1.73_m2} — ABNORMAL LOW (ref >90)
Glucose: 115 mg/dl (ref 70–140)
POTASSIUM: 4.1 meq/L (ref 3.5–5.1)
Sodium: 138 mEq/L (ref 136–145)
Total Bilirubin: 0.56 mg/dL (ref 0.20–1.20)
Total Protein: 6.7 g/dL (ref 6.4–8.3)

## 2014-02-19 LAB — CBC WITH DIFFERENTIAL/PLATELET
BASO%: 0.4 % (ref 0.0–2.0)
BASOS ABS: 0 10*3/uL (ref 0.0–0.1)
EOS%: 0.7 % (ref 0.0–7.0)
Eosinophils Absolute: 0 10*3/uL (ref 0.0–0.5)
HCT: 31.4 % — ABNORMAL LOW (ref 34.8–46.6)
HEMOGLOBIN: 10.7 g/dL — AB (ref 11.6–15.9)
LYMPH#: 1.4 10*3/uL (ref 0.9–3.3)
LYMPH%: 30.1 % (ref 14.0–49.7)
MCH: 31.2 pg (ref 25.1–34.0)
MCHC: 34.1 g/dL (ref 31.5–36.0)
MCV: 91.5 fL (ref 79.5–101.0)
MONO#: 0.3 10*3/uL (ref 0.1–0.9)
MONO%: 6.8 % (ref 0.0–14.0)
NEUT%: 62 % (ref 38.4–76.8)
NEUTROS ABS: 2.9 10*3/uL (ref 1.5–6.5)
Platelets: 147 10*3/uL (ref 145–400)
RBC: 3.43 10*6/uL — ABNORMAL LOW (ref 3.70–5.45)
RDW: 18.3 % — ABNORMAL HIGH (ref 11.2–14.5)
WBC: 4.6 10*3/uL (ref 3.9–10.3)

## 2014-02-19 MED ORDER — SODIUM CHLORIDE 0.9 % IJ SOLN
10.0000 mL | INTRAMUSCULAR | Status: DC | PRN
  Administered 2014-02-19: 10 mL
  Filled 2014-02-19: qty 10

## 2014-02-19 MED ORDER — SODIUM CHLORIDE 0.9 % IV SOLN
Freq: Once | INTRAVENOUS | Status: AC
  Administered 2014-02-19: 13:00:00 via INTRAVENOUS

## 2014-02-19 MED ORDER — HEPARIN SOD (PORK) LOCK FLUSH 100 UNIT/ML IV SOLN
500.0000 [IU] | Freq: Once | INTRAVENOUS | Status: AC | PRN
  Administered 2014-02-19: 500 [IU]
  Filled 2014-02-19: qty 5

## 2014-02-19 MED ORDER — PACLITAXEL PROTEIN-BOUND CHEMO INJECTION 100 MG
100.0000 mg/m2 | Freq: Once | INTRAVENOUS | Status: AC
  Administered 2014-02-19: 175 mg via INTRAVENOUS
  Filled 2014-02-19: qty 35

## 2014-02-19 MED ORDER — ONDANSETRON 16 MG/50ML IVPB (CHCC)
16.0000 mg | Freq: Once | INTRAVENOUS | Status: AC
  Administered 2014-02-19: 16 mg via INTRAVENOUS

## 2014-02-19 MED ORDER — SODIUM CHLORIDE 0.9 % IV SOLN
450.0000 mg | Freq: Once | INTRAVENOUS | Status: AC
  Administered 2014-02-19: 450 mg via INTRAVENOUS
  Filled 2014-02-19: qty 45

## 2014-02-19 MED ORDER — DEXAMETHASONE SODIUM PHOSPHATE 20 MG/5ML IJ SOLN
INTRAMUSCULAR | Status: AC
  Filled 2014-02-19: qty 5

## 2014-02-19 MED ORDER — ONDANSETRON 16 MG/50ML IVPB (CHCC)
INTRAVENOUS | Status: AC
  Filled 2014-02-19: qty 16

## 2014-02-19 MED ORDER — DEXAMETHASONE SODIUM PHOSPHATE 20 MG/5ML IJ SOLN
20.0000 mg | Freq: Once | INTRAMUSCULAR | Status: AC
  Administered 2014-02-19: 20 mg via INTRAVENOUS

## 2014-02-19 NOTE — Progress Notes (Signed)
[facility] Telephone:([phone]   Fax:(336) [place] [address] [place] [address]  DIAGNOSIS: Stage IV (T2, N2, M1a) non-small cell lung cancer, squamous cell carcinoma presented with central left upper lobe lung mass with associated postobstructive atelectasis as well as ipsilateral and subcarinal lymphadenopathy as well as pleural tumor spread diagnosed in October 2015.  PRIOR THERAPY: None.  CURRENT THERAPY: Systemic chemotherapy with carboplatin for AUC of 5 on day 1 and Abraxane 100 MG/M2 on days 1, 8 and 15 every 3 weeks. Status post 3 cycles.   INTERVAL HISTORY: Tammie Clay 69 y.o. female returns to the clinic today for follow-up visit accompanied by her significant other and a friend. She is tolerating her current systemic chemotherapy with carboplatin and Abraxane fairly well. She denied having any significant fever or chills. The patient denied having any significant chest pain, shortness of breath, cough or hemoptysis. She has no nausea or vomiting. She denied having any significant weight loss or night sweats. She had repeat CT scan of the chest, abdomen and pelvis performed recently and she is here for evaluation and discussion of her scan results.  MEDICAL HISTORY: Past Medical History  Diagnosis Date  . Hypertension   . COPD (chronic obstructive pulmonary disease)   . Hyperlipidemia   . GERD (gastroesophageal reflux disease)   . Osteopenia   . Insomnia   . Mediastinal lymphadenopathy 11/19/13    PER CT  . Multiple lung nodules on CT 11/19/13    LEFT LUNG BASE  . Lung mass 11/19/13    RUL PER CT  . Coronary artery calcification seen on CAT scan 11/21/2013  . COPD (chronic obstructive pulmonary disease) 11/21/2013  . Pneumonia     hx of  . Anxiety     ALLERGIES:  has No Known Allergies.  MEDICATIONS:  Current Outpatient Prescriptions  Medication Sig Dispense Refill    . albuterol (PROVENTIL HFA;VENTOLIN HFA) 108 (90 BASE) MCG/ACT inhaler Inhale 2 puffs into the lungs every 6 (six) hours as needed for wheezing.    . diazepam (VALIUM) 5 MG tablet Take 5 mg by mouth at bedtime.    . DULoxetine (CYMBALTA) 30 MG capsule Take 1 capsule (30 mg total) by mouth daily. 30 capsule 2  . HYDROcodone-acetaminophen (NORCO/VICODIN) 5-325 MG per tablet Take 1-2 tablets by mouth every 6 (six) hours as needed for moderate pain.    Marland Kitchen lidocaine-prilocaine (EMLA) cream Apply 1 application topically as needed. 30 g 0  . lisinopril-hydrochlorothiazide (PRINZIDE,ZESTORETIC) 20-12.5 MG per tablet Take 2 tablets by mouth every morning.     . metoprolol succinate (TOPROL-XL) 50 MG 24 hr tablet Take 50 mg by mouth every morning. Take with or immediately following a meal.    . Multiple Vitamin (MULTIVITAMIN WITH MINERALS) TABS Take 1 tablet by mouth daily.    . prochlorperazine (COMPAZINE) 10 MG tablet Take 1 tablet (10 mg total) by mouth every 6 (six) hours as needed for nausea or vomiting. 30 tablet 0  . tiotropium (SPIRIVA) 18 MCG inhalation capsule Place 18 mcg into inhaler and inhale daily.     No current facility-administered medications for this visit.    SURGICAL HISTORY:  Past Surgical History  Procedure Laterality Date  .  c section x 2    . Urethral cyst removed    . Tonsillectomy    . Appendectomy    . Colonoscopy with propofol N/A 07/17/2012  Procedure: COLONOSCOPY WITH PROPOFOL;  Surgeon: Garlan Fair, MD;  Location: [facility];  Service: Endoscopy;  Laterality: N/A;  . Esophagogastroduodenoscopy (egd) with propofol N/A 07/17/2012    Procedure: ESOPHAGOGASTRODUODENOSCOPY (EGD) WITH PROPOFOL;  Surgeon: Garlan Fair, MD;  Location: [facility];  Service: Endoscopy;  Laterality: N/A;  . Video bronchoscopy with endobronchial ultrasound N/A 12/03/2013    Procedure: VIDEO BRONCHOSCOPY WITH ENDOBRONCHIAL ULTRASOUND WITH BIOPSIES;  Surgeon: Grace Isaac, MD;   Location: [facility];  Service: Thoracic;  Laterality: N/A;  . Portacath placement Left 12/10/2013    Procedure: INSERTION PORT-A-CATH;  Surgeon: Grace Isaac, MD;  Location: [facility];  Service: Thoracic;  Laterality: Left;    REVIEW OF SYSTEMS:  Constitutional: positive for fatigue Eyes: negative Ears, nose, mouth, throat, and face: negative Respiratory: positive for cough Cardiovascular: negative Gastrointestinal: negative Genitourinary:negative Integument/breast: negative Hematologic/lymphatic: negative Musculoskeletal:negative Neurological: negative Behavioral/Psych: negative Endocrine: negative Allergic/Immunologic: negative   PHYSICAL EXAMINATION: General appearance: alert, cooperative, fatigued and no distress Head: Normocephalic, without obvious abnormality, atraumatic Neck: no adenopathy, no JVD, supple, symmetrical, trachea midline and thyroid not enlarged, symmetric, no tenderness/mass/nodules Lymph nodes: Cervical, supraclavicular, and axillary nodes normal. Resp: clear to auscultation bilaterally Back: symmetric, no curvature. ROM normal. No CVA tenderness. Cardio: regular rate and rhythm, S1, S2 normal, no murmur, click, rub or gallop GI: soft, non-tender; bowel sounds normal; no masses,  no organomegaly Genitalia: defer exam Extremities: extremities normal, atraumatic, no cyanosis or edema Neurologic: Alert and oriented X 3, normal strength and tone. Normal symmetric reflexes. Normal coordination and gait  ECOG PERFORMANCE STATUS: 1 - Symptomatic but completely ambulatory  Blood pressure 120/99, pulse 94, temperature 98.2 F (36.8 C), temperature source Oral, resp. rate 18, height 5\' 2"  (1.575 m), weight 172 lb 3.2 oz (78.109 kg), SpO2 100 %.  LABORATORY DATA: Lab Results  Component Value Date   WBC 4.6 02/19/2014   HGB 10.7* 02/19/2014   HCT 31.4* 02/19/2014   MCV 91.5 02/19/2014   PLT 147 02/19/2014      Chemistry      Component Value Date/Time   NA 138  02/19/2014 1022   NA 138 12/10/2013 0637   K 4.1 02/19/2014 1022   K 4.7 12/10/2013 0637   CL 99 12/10/2013 0637   CO2 28 02/19/2014 1022   CO2 28 12/10/2013 0637   BUN 21.6 02/19/2014 1022   BUN 22 12/10/2013 0637   CREATININE 1.0 02/19/2014 1022   CREATININE 0.75 12/10/2013 0637   CREATININE 0.80 11/22/2013 1054      Component Value Date/Time   CALCIUM 8.9 02/19/2014 1022   CALCIUM 9.8 12/10/2013 0637   ALKPHOS 78 02/19/2014 1022   ALKPHOS 77 12/10/2013 0637   AST 21 02/19/2014 1022   AST 16 12/10/2013 0637   ALT 26 02/19/2014 1022   ALT 16 12/10/2013 0637   BILITOT 0.56 02/19/2014 1022   BILITOT 0.4 12/10/2013 0637       RADIOGRAPHIC STUDIES:  ASSESSMENT AND PLAN: This is a very pleasant 69 years old white female recently diagnosed with:  1) Stage IV non-small cell lung cancer, squamous cell carcinoma currently undergoing systemic chemotherapy with carboplatin and Abraxane status post 3 cycles. She is tolerating her treatment fairly well with no significant adverse effects except for fatigue. The recent CT scan of the chest, abdomen and pelvis showed significant improvement in her disease. I discussed the scan results and showed the images to the patient and her family. I recommended for her to continue her  current treatment with carboplatin and Abraxane as a scheduled. She will start cycle #4 today.  2) chemotherapy-induced anemia: We will continue to monitor her closely and consider her for PRBCs transfusion if hemoglobin less than 8.0 g/dL. She will come back for follow-up visit in 3 weeks for reevaluation before starting cycle #5.  The patient was advised to call immediately if she has any concerning symptoms in the interval.  The patient voices understanding of current disease status and treatment options and is in agreement with the current care plan.  All questions were answered. The patient knows to call the clinic with any problems, questions or concerns. We can  certainly see the patient much sooner if necessary.  I spent 15 minutes counseling the patient face to face. The total time spent in the appointment was 25 minutes.  Disclaimer: This note was dictated with voice recognition software. Similar sounding words can inadvertently be transcribed and may not be corrected upon review.

## 2014-02-19 NOTE — Telephone Encounter (Signed)
gv and printed appt sched and avs fo rpt for Jan thru March 2016.Marland Kitchen

## 2014-02-19 NOTE — Patient Instructions (Signed)
[facility] Discharge Instructions for Patients Receiving Chemotherapy  Today you received the following chemotherapy agents Abraxane and Carboplatin  To help prevent nausea and vomiting after your treatment, we encourage you to take your nausea medication as prescribed.  If you develop nausea and vomiting that is not controlled by your nausea medication, call the clinic.   BELOW ARE SYMPTOMS THAT SHOULD BE REPORTED IMMEDIATELY:  *FEVER GREATER THAN 100.5 F  *CHILLS WITH OR WITHOUT FEVER  NAUSEA AND VOMITING THAT IS NOT CONTROLLED WITH YOUR NAUSEA MEDICATION  *UNUSUAL SHORTNESS OF BREATH  *UNUSUAL BRUISING OR BLEEDING  TENDERNESS IN MOUTH AND THROAT WITH OR WITHOUT PRESENCE OF ULCERS  *URINARY PROBLEMS  *BOWEL PROBLEMS  UNUSUAL RASH Items with * indicate a potential emergency and should be followed up as soon as possible.  Feel free to call the clinic you have any questions or concerns. The clinic phone number is [phone].

## 2014-02-26 ENCOUNTER — Other Ambulatory Visit (HOSPITAL_BASED_OUTPATIENT_CLINIC_OR_DEPARTMENT_OTHER): Payer: Medicare Other

## 2014-02-26 ENCOUNTER — Ambulatory Visit (HOSPITAL_BASED_OUTPATIENT_CLINIC_OR_DEPARTMENT_OTHER): Payer: Medicare Other

## 2014-02-26 DIAGNOSIS — C3492 Malignant neoplasm of unspecified part of left bronchus or lung: Secondary | ICD-10-CM

## 2014-02-26 DIAGNOSIS — C3412 Malignant neoplasm of upper lobe, left bronchus or lung: Secondary | ICD-10-CM

## 2014-02-26 DIAGNOSIS — Z5111 Encounter for antineoplastic chemotherapy: Secondary | ICD-10-CM

## 2014-02-26 LAB — COMPREHENSIVE METABOLIC PANEL (CC13)
ALT: 21 U/L (ref 0–55)
AST: 19 U/L (ref 5–34)
Albumin: 3.7 g/dL (ref 3.5–5.0)
Alkaline Phosphatase: 73 U/L (ref 40–150)
Anion Gap: 9 mEq/L (ref 3–11)
BUN: 35 mg/dL — AB (ref 7.0–26.0)
CALCIUM: 8.9 mg/dL (ref 8.4–10.4)
CHLORIDE: 101 meq/L (ref 98–109)
CO2: 27 mEq/L (ref 22–29)
Creatinine: 0.9 mg/dL (ref 0.6–1.1)
EGFR: 68 mL/min/{1.73_m2} — ABNORMAL LOW (ref >90)
GLUCOSE: 125 mg/dL (ref 70–140)
Potassium: 4 mEq/L (ref 3.5–5.1)
SODIUM: 137 meq/L (ref 136–145)
TOTAL PROTEIN: 6.5 g/dL (ref 6.4–8.3)
Total Bilirubin: 0.34 mg/dL (ref 0.20–1.20)

## 2014-02-26 LAB — CBC WITH DIFFERENTIAL/PLATELET
BASO%: 0.7 % (ref 0.0–2.0)
Basophils Absolute: 0 10*3/uL (ref 0.0–0.1)
EOS%: 0.6 % (ref 0.0–7.0)
Eosinophils Absolute: 0 10*3/uL (ref 0.0–0.5)
HCT: 28.6 % — ABNORMAL LOW (ref 34.8–46.6)
HGB: 9.5 g/dL — ABNORMAL LOW (ref 11.6–15.9)
LYMPH#: 0.9 10*3/uL (ref 0.9–3.3)
LYMPH%: 30.2 % (ref 14.0–49.7)
MCH: 31.4 pg (ref 25.1–34.0)
MCHC: 33.2 g/dL (ref 31.5–36.0)
MCV: 94.9 fL (ref 79.5–101.0)
MONO#: 0.2 10*3/uL (ref 0.1–0.9)
MONO%: 6 % (ref 0.0–14.0)
NEUT#: 1.9 10*3/uL (ref 1.5–6.5)
NEUT%: 62.5 % (ref 38.4–76.8)
Platelets: 200 10*3/uL (ref 145–400)
RBC: 3.01 10*6/uL — ABNORMAL LOW (ref 3.70–5.45)
RDW: 20.2 % — AB (ref 11.2–14.5)
WBC: 3 10*3/uL — ABNORMAL LOW (ref 3.9–10.3)

## 2014-02-26 MED ORDER — SODIUM CHLORIDE 0.9 % IJ SOLN
10.0000 mL | INTRAMUSCULAR | Status: DC | PRN
  Administered 2014-02-26: 10 mL
  Filled 2014-02-26: qty 10

## 2014-02-26 MED ORDER — DEXAMETHASONE SODIUM PHOSPHATE 10 MG/ML IJ SOLN
10.0000 mg | Freq: Once | INTRAMUSCULAR | Status: AC
  Administered 2014-02-26: 10 mg via INTRAVENOUS

## 2014-02-26 MED ORDER — PACLITAXEL PROTEIN-BOUND CHEMO INJECTION 100 MG
100.0000 mg/m2 | Freq: Once | INTRAVENOUS | Status: AC
  Administered 2014-02-26: 175 mg via INTRAVENOUS
  Filled 2014-02-26: qty 35

## 2014-02-26 MED ORDER — SODIUM CHLORIDE 0.9 % IV SOLN
Freq: Once | INTRAVENOUS | Status: AC
  Administered 2014-02-26: 12:00:00 via INTRAVENOUS

## 2014-02-26 MED ORDER — HEPARIN SOD (PORK) LOCK FLUSH 100 UNIT/ML IV SOLN
500.0000 [IU] | Freq: Once | INTRAVENOUS | Status: AC | PRN
  Administered 2014-02-26: 500 [IU]
  Filled 2014-02-26: qty 5

## 2014-02-26 MED ORDER — ONDANSETRON 8 MG/50ML IVPB (CHCC)
8.0000 mg | Freq: Once | INTRAVENOUS | Status: AC
  Administered 2014-02-26: 8 mg via INTRAVENOUS

## 2014-02-26 MED ORDER — ONDANSETRON 8 MG/NS 50 ML IVPB
INTRAVENOUS | Status: AC
  Filled 2014-02-26: qty 8

## 2014-02-26 NOTE — Patient Instructions (Signed)
[facility] Discharge Instructions for Patients Receiving Chemotherapy  Today you received the following chemotherapy agents Abraxane.   To help prevent nausea and vomiting after your treatment, we encourage you to take your nausea medication as directed.    If you develop nausea and vomiting that is not controlled by your nausea medication, call the clinic.   BELOW ARE SYMPTOMS THAT SHOULD BE REPORTED IMMEDIATELY:  *FEVER GREATER THAN 100.5 F  *CHILLS WITH OR WITHOUT FEVER  NAUSEA AND VOMITING THAT IS NOT CONTROLLED WITH YOUR NAUSEA MEDICATION  *UNUSUAL SHORTNESS OF BREATH  *UNUSUAL BRUISING OR BLEEDING  TENDERNESS IN MOUTH AND THROAT WITH OR WITHOUT PRESENCE OF ULCERS  *URINARY PROBLEMS  *BOWEL PROBLEMS  UNUSUAL RASH Items with * indicate a potential emergency and should be followed up as soon as possible.  Feel free to call the clinic you have any questions or concerns. The clinic phone number is [phone].

## 2014-03-04 ENCOUNTER — Other Ambulatory Visit: Payer: Self-pay | Admitting: Internal Medicine

## 2014-03-05 ENCOUNTER — Ambulatory Visit: Payer: Medicare Other

## 2014-03-05 ENCOUNTER — Other Ambulatory Visit (HOSPITAL_BASED_OUTPATIENT_CLINIC_OR_DEPARTMENT_OTHER): Payer: Medicare Other

## 2014-03-05 DIAGNOSIS — C3412 Malignant neoplasm of upper lobe, left bronchus or lung: Secondary | ICD-10-CM

## 2014-03-05 DIAGNOSIS — C3492 Malignant neoplasm of unspecified part of left bronchus or lung: Secondary | ICD-10-CM

## 2014-03-05 LAB — CBC WITH DIFFERENTIAL/PLATELET
BASO%: 0.5 % (ref 0.0–2.0)
Basophils Absolute: 0 10*3/uL (ref 0.0–0.1)
EOS%: 0.5 % (ref 0.0–7.0)
Eosinophils Absolute: 0 10*3/uL (ref 0.0–0.5)
HCT: 27.4 % — ABNORMAL LOW (ref 34.8–46.6)
HEMOGLOBIN: 9.4 g/dL — AB (ref 11.6–15.9)
LYMPH#: 0.8 10*3/uL — AB (ref 0.9–3.3)
LYMPH%: 38.2 % (ref 14.0–49.7)
MCH: 31.9 pg (ref 25.1–34.0)
MCHC: 34.3 g/dL (ref 31.5–36.0)
MCV: 92.9 fL (ref 79.5–101.0)
MONO#: 0.3 10*3/uL (ref 0.1–0.9)
MONO%: 13.6 % (ref 0.0–14.0)
NEUT#: 1 10*3/uL — ABNORMAL LOW (ref 1.5–6.5)
NEUT%: 47.2 % (ref 38.4–76.8)
Platelets: 186 10*3/uL (ref 145–400)
RBC: 2.95 10*6/uL — ABNORMAL LOW (ref 3.70–5.45)
RDW: 20.1 % — ABNORMAL HIGH (ref 11.2–14.5)
WBC: 2.2 10*3/uL — ABNORMAL LOW (ref 3.9–10.3)
nRBC: 2 % — ABNORMAL HIGH (ref 0–0)

## 2014-03-05 LAB — COMPREHENSIVE METABOLIC PANEL (CC13)
ALK PHOS: 68 U/L (ref 40–150)
ALT: 25 U/L (ref 0–55)
ANION GAP: 11 meq/L (ref 3–11)
AST: 33 U/L (ref 5–34)
Albumin: 3.6 g/dL (ref 3.5–5.0)
BILIRUBIN TOTAL: 0.48 mg/dL (ref 0.20–1.20)
BUN: 23.2 mg/dL (ref 7.0–26.0)
CO2: 25 mEq/L (ref 22–29)
CREATININE: 0.9 mg/dL (ref 0.6–1.1)
Calcium: 8.2 mg/dL — ABNORMAL LOW (ref 8.4–10.4)
Chloride: 102 mEq/L (ref 98–109)
EGFR: 68 mL/min/{1.73_m2} — AB (ref >90)
Glucose: 131 mg/dl (ref 70–140)
Potassium: 4 mEq/L (ref 3.5–5.1)
Sodium: 137 mEq/L (ref 136–145)
Total Protein: 6.3 g/dL — ABNORMAL LOW (ref 6.4–8.3)

## 2014-03-05 NOTE — Progress Notes (Signed)
Hold treatment today per Dr. [name], neutropenic precautions reviewed, masks given. Neutopenia handout given as well. Patient to call clinic with any concerns or questions. Patient verbalized understanding to the above information.

## 2014-03-05 NOTE — Patient Instructions (Signed)
Neutropenia Neutropenia is a condition that occurs when the level of a certain type of white blood cell (neutrophil) in your body becomes lower than normal. Neutrophils are made in the bone marrow and fight infections. These cells protect against bacteria and viruses. The fewer neutrophils you have, and the longer your body remains without them, the greater your risk of getting a severe infection becomes. CAUSES  The cause of neutropenia may be hard to determine. However, it is usually due to 3 main problems:   Decreased production of neutrophils. This may be due to:  Certain medicines such as chemotherapy.  Genetic problems.  Cancer.  Radiation treatments.  Vitamin deficiency.  Some pesticides.  Increased destruction of neutrophils. This may be due to:  Overwhelming infections.  Hemolytic anemia. This is when the body destroys its own blood cells.  Chemotherapy.  Neutrophils moving to areas of the body where they cannot fight infections. This may be due to:  Dialysis procedures.  Conditions where the spleen becomes enlarged. Neutrophils are held in the spleen and are not available to the rest of the body.  Overwhelming infections. The neutrophils are held in the area of the infection and are not available to the rest of the body. SYMPTOMS  There are no specific symptoms of neutropenia. The lack of neutrophils can result in an infection, and an infection can cause various problems. DIAGNOSIS  Diagnosis is made by a blood test. A complete blood count is performed. The normal level of neutrophils in human blood differs with age and race. Infants have lower counts than older children and adults. African Americans have lower counts than Caucasians or Asians. The average adult level is 1500 cells/mm3 of blood. Neutrophil counts are interpreted as follows:  Greater than 1000 cells/mm3 gives normal protection against infection.  500 to 1000 cells/mm3 gives an increased risk for  infection.  200 to 500 cells/mm3 is a greater risk for severe infection.  Lower than 200 cells/mm3 is a marked risk of infection. This may require hospitalization and treatment with antibiotic medicines. TREATMENT  Treatment depends on the underlying cause, severity, and presence of infections or symptoms. It also depends on your health. Your caregiver will discuss the treatment plan with you. Mild cases are often easily treated and have a good outcome. Preventative measures may also be started to limit your risk of infections. Treatment can include:  Taking antibiotics.  Stopping medicines that are known to cause neutropenia.  Correcting nutritional deficiencies by eating green vegetables to supply folic acid and taking vitamin B supplements.  Stopping exposure to pesticides if your neutropenia is related to pesticide exposure.  Taking a blood growth factor called sargramostim, pegfilgrastim, or filgrastim if you are undergoing chemotherapy for cancer. This stimulates white blood cell production.  Removal of the spleen if you have Felty's syndrome and have repeated infections. HOME CARE INSTRUCTIONS   Follow your caregiver's instructions about when you need to have blood work done.  Wash your hands often. Make sure others who come in contact with you also wash their hands.  Wash raw fruits and vegetables before eating them. They can carry bacteria and fungi.  Avoid people with colds or spreadable (contagious) diseases (chickenpox, herpes zoster, influenza).  Avoid large crowds.  Avoid construction areas. The dust can release fungus into the air.  Be cautious around children in daycare or school environments.  Take care of your respiratory system by coughing and deep breathing.  Bathe daily.  Protect your skin from cuts and   burns.  Do not work in the garden or with flowers and plants.  Care for the mouth before and after meals by brushing with a soft toothbrush. If you have  mucositis, do not use mouthwash. Mouthwash contains alcohol and can dry out the mouth even more.  Clean the area between the genitals and the anus (perineal area) after urination and bowel movements. Women need to wipe from front to back.  Use a water soluble lubricant during sexual intercourse and practice good hygiene after. Do not have intercourse if you are severely neutropenic. Check with your caregiver for guidelines.  Exercise daily as tolerated.  Avoid people who were vaccinated with a live vaccine in the past 30 days. You should not receive live vaccines (polio, typhoid).  Do not provide direct care for pets. Avoid animal droppings. Do not clean litter boxes and bird cages.  Do not share food utensils.  Do not use tampons, enemas, or rectal suppositories unless directed by your caregiver.  Use an electric razor to remove hair.  Wash your hands after handling magazines, letters, and newspapers. SEEK IMMEDIATE MEDICAL CARE IF:   You have a fever.  You have chills or start to shake.  You feel nauseous or vomit.  You develop mouth sores.  You develop aches and pains.  You have redness and swelling around open wounds.  Your skin is warm to the touch.  You have pus coming from your wounds.  You develop swollen lymph nodes.  You feel weak or fatigued.  You develop red streaks on the skin. MAKE SURE YOU:  Understand these instructions.  Will watch your condition.  Will get help right away if you are not doing well or get worse. Document Released: 07/16/2001 Document Revised: 04/18/2011 Document Reviewed: 08/13/2010 Exit[employer] Patient Information 2015 [employer]. This information is not intended to replace advice given to you by your health care provider. Make sure you discuss any questions you have with your health care provider.  

## 2014-03-12 ENCOUNTER — Other Ambulatory Visit (HOSPITAL_BASED_OUTPATIENT_CLINIC_OR_DEPARTMENT_OTHER): Payer: Medicare Other

## 2014-03-12 ENCOUNTER — Ambulatory Visit: Payer: Medicare Other

## 2014-03-12 ENCOUNTER — Ambulatory Visit (HOSPITAL_BASED_OUTPATIENT_CLINIC_OR_DEPARTMENT_OTHER): Payer: Medicare Other

## 2014-03-12 ENCOUNTER — Ambulatory Visit (HOSPITAL_BASED_OUTPATIENT_CLINIC_OR_DEPARTMENT_OTHER): Payer: Medicare Other | Admitting: Internal Medicine

## 2014-03-12 ENCOUNTER — Encounter: Payer: Self-pay | Admitting: Internal Medicine

## 2014-03-12 ENCOUNTER — Telehealth: Payer: Self-pay | Admitting: Internal Medicine

## 2014-03-12 VITALS — BP 121/69 | HR 97 | Temp 98.0°F | Resp 18 | Ht 62.0 in | Wt 173.8 lb

## 2014-03-12 DIAGNOSIS — C3412 Malignant neoplasm of upper lobe, left bronchus or lung: Secondary | ICD-10-CM

## 2014-03-12 DIAGNOSIS — Z5111 Encounter for antineoplastic chemotherapy: Secondary | ICD-10-CM

## 2014-03-12 DIAGNOSIS — C3492 Malignant neoplasm of unspecified part of left bronchus or lung: Secondary | ICD-10-CM

## 2014-03-12 DIAGNOSIS — C782 Secondary malignant neoplasm of pleura: Secondary | ICD-10-CM

## 2014-03-12 DIAGNOSIS — D6481 Anemia due to antineoplastic chemotherapy: Secondary | ICD-10-CM

## 2014-03-12 LAB — COMPREHENSIVE METABOLIC PANEL (CC13)
ALT: 20 U/L (ref 0–55)
AST: 19 U/L (ref 5–34)
Albumin: 3.6 g/dL (ref 3.5–5.0)
Alkaline Phosphatase: 74 U/L (ref 40–150)
Anion Gap: 9 mEq/L (ref 3–11)
BUN: 19.7 mg/dL (ref 7.0–26.0)
CHLORIDE: 103 meq/L (ref 98–109)
CO2: 27 mEq/L (ref 22–29)
Calcium: 8.6 mg/dL (ref 8.4–10.4)
Creatinine: 0.8 mg/dL (ref 0.6–1.1)
EGFR: 72 mL/min/{1.73_m2} — AB (ref >90)
GLUCOSE: 117 mg/dL (ref 70–140)
Potassium: 3.8 mEq/L (ref 3.5–5.1)
Sodium: 139 mEq/L (ref 136–145)
TOTAL PROTEIN: 6.6 g/dL (ref 6.4–8.3)
Total Bilirubin: 0.52 mg/dL (ref 0.20–1.20)

## 2014-03-12 LAB — CBC WITH DIFFERENTIAL/PLATELET
BASO%: 0.8 % (ref 0.0–2.0)
BASOS ABS: 0 10*3/uL (ref 0.0–0.1)
EOS%: 0.3 % (ref 0.0–7.0)
Eosinophils Absolute: 0 10*3/uL (ref 0.0–0.5)
HCT: 28.3 % — ABNORMAL LOW (ref 34.8–46.6)
HGB: 9.3 g/dL — ABNORMAL LOW (ref 11.6–15.9)
LYMPH%: 26.8 % (ref 14.0–49.7)
MCH: 32.3 pg (ref 25.1–34.0)
MCHC: 32.8 g/dL (ref 31.5–36.0)
MCV: 98.6 fL (ref 79.5–101.0)
MONO#: 0.8 10*3/uL (ref 0.1–0.9)
MONO%: 15.9 % — AB (ref 0.0–14.0)
NEUT%: 56.2 % (ref 38.4–76.8)
NEUTROS ABS: 2.7 10*3/uL (ref 1.5–6.5)
Platelets: 183 10*3/uL (ref 145–400)
RBC: 2.87 10*6/uL — ABNORMAL LOW (ref 3.70–5.45)
RDW: 25.4 % — ABNORMAL HIGH (ref 11.2–14.5)
WBC: 4.8 10*3/uL (ref 3.9–10.3)
lymph#: 1.3 10*3/uL (ref 0.9–3.3)

## 2014-03-12 MED ORDER — SODIUM CHLORIDE 0.9 % IV SOLN
Freq: Once | INTRAVENOUS | Status: AC
  Administered 2014-03-12: 15:00:00 via INTRAVENOUS

## 2014-03-12 MED ORDER — DEXAMETHASONE SODIUM PHOSPHATE 20 MG/5ML IJ SOLN
20.0000 mg | Freq: Once | INTRAMUSCULAR | Status: AC
  Administered 2014-03-12: 20 mg via INTRAVENOUS

## 2014-03-12 MED ORDER — SODIUM CHLORIDE 0.9 % IJ SOLN
10.0000 mL | INTRAMUSCULAR | Status: DC | PRN
  Administered 2014-03-12: 10 mL
  Filled 2014-03-12: qty 10

## 2014-03-12 MED ORDER — ONDANSETRON 16 MG/50ML IVPB (CHCC)
16.0000 mg | Freq: Once | INTRAVENOUS | Status: AC
  Administered 2014-03-12: 16 mg via INTRAVENOUS

## 2014-03-12 MED ORDER — SODIUM CHLORIDE 0.9 % IV SOLN
450.0000 mg | Freq: Once | INTRAVENOUS | Status: AC
  Administered 2014-03-12: 450 mg via INTRAVENOUS
  Filled 2014-03-12: qty 45

## 2014-03-12 MED ORDER — DEXAMETHASONE SODIUM PHOSPHATE 20 MG/5ML IJ SOLN
INTRAMUSCULAR | Status: AC
Start: 2014-03-12 — End: 2014-03-12
  Filled 2014-03-12: qty 5

## 2014-03-12 MED ORDER — ONDANSETRON 16 MG/50ML IVPB (CHCC)
INTRAVENOUS | Status: AC
  Filled 2014-03-12: qty 16

## 2014-03-12 MED ORDER — DEXAMETHASONE SODIUM PHOSPHATE 20 MG/5ML IJ SOLN
INTRAMUSCULAR | Status: AC
  Filled 2014-03-12: qty 5

## 2014-03-12 MED ORDER — PACLITAXEL PROTEIN-BOUND CHEMO INJECTION 100 MG
100.0000 mg/m2 | Freq: Once | INTRAVENOUS | Status: AC
  Administered 2014-03-12: 175 mg via INTRAVENOUS
  Filled 2014-03-12: qty 35

## 2014-03-12 MED ORDER — HEPARIN SOD (PORK) LOCK FLUSH 100 UNIT/ML IV SOLN
500.0000 [IU] | Freq: Once | INTRAVENOUS | Status: AC | PRN
  Administered 2014-03-12: 500 [IU]
  Filled 2014-03-12: qty 5

## 2014-03-12 NOTE — Telephone Encounter (Signed)
gave pt avs report and appts for feb and march.

## 2014-03-12 NOTE — Progress Notes (Signed)
[facility] [phone]   Fax:(336) [place] [address] [address]  DIAGNOSIS: Stage IV (T2, N2, M1a) non-small cell lung cancer, squamous cell carcinoma presented with central left upper lobe lung mass with associated postobstructive atelectasis as well as ipsilateral and subcarinal lymphadenopathy as well as pleural tumor spread diagnosed in October 2015.  PRIOR THERAPY: None.  CURRENT THERAPY: Systemic chemotherapy with carboplatin for AUC of 5 on day 1 and Abraxane 100 MG/M2 on days 1, 8 and 15 every 3 weeks. Status post 4 cycles.   INTERVAL HISTORY: Tammie Clay 69 y.o. female returns to the clinic today for follow-up visit accompanied by her significant other. She is tolerating her current systemic chemotherapy with carboplatin and Abraxane fairly well. She had few episodes of nasal mucus discharge with blood tinge mainly in the morning hours. She missed day 15 of the last cycle because of neutropenia. She denied having any significant fever or chills. The patient denied having any significant chest pain, shortness of breath, cough or hemoptysis. She has no nausea or vomiting. She denied having any significant weight loss or night sweats.   MEDICAL HISTORY: Past Medical History  Diagnosis Date  . Hypertension   . COPD (chronic obstructive pulmonary disease)   . Hyperlipidemia   . GERD (gastroesophageal reflux disease)   . Osteopenia   . Insomnia   . Mediastinal lymphadenopathy 11/19/13    PER CT  . Multiple lung nodules on CT 11/19/13    LEFT LUNG BASE  . Lung mass 11/19/13    RUL PER CT  . Coronary artery calcification seen on CAT scan 11/21/2013  . COPD (chronic obstructive pulmonary disease) 11/21/2013  . Pneumonia     hx of  . Anxiety     ALLERGIES:  has No Known Allergies.  MEDICATIONS:  Current Outpatient Prescriptions  Medication Sig Dispense Refill  .  albuterol (PROVENTIL HFA;VENTOLIN HFA) 108 (90 BASE) MCG/ACT inhaler Inhale 2 puffs into the lungs every 6 (six) hours as needed for wheezing.    . diazepam (VALIUM) 5 MG tablet Take 5 mg by mouth at bedtime.    . DULoxetine (CYMBALTA) 30 MG capsule TAKE 1 CAPSULE (30 MG TOTAL) BY MOUTH DAILY. 30 capsule 2  . HYDROcodone-acetaminophen (NORCO/VICODIN) 5-325 MG per tablet Take 1-2 tablets by mouth every 6 (six) hours as needed for moderate pain.    Marland Kitchen lidocaine-prilocaine (EMLA) cream Apply 1 application topically as needed. 30 g 0  . lisinopril-hydrochlorothiazide (PRINZIDE,ZESTORETIC) 20-12.5 MG per tablet Take 2 tablets by mouth every morning.     . metoprolol succinate (TOPROL-XL) 50 MG 24 hr tablet Take 50 mg by mouth every morning. Take with or immediately following a meal.    . Multiple Vitamin (MULTIVITAMIN WITH MINERALS) TABS Take 1 tablet by mouth daily.    . prochlorperazine (COMPAZINE) 10 MG tablet Take 1 tablet (10 mg total) by mouth every 6 (six) hours as needed for nausea or vomiting. 30 tablet 0  . tiotropium (SPIRIVA) 18 MCG inhalation capsule Place 18 mcg into inhaler and inhale daily.     No current facility-administered medications for this visit.   Facility-Administered Medications Ordered in Other Visits  Medication Dose Route Frequency Provider Last Rate Last Dose  . sodium chloride 0.9 % injection 10 mL  10 mL Intracatheter PRN Curt Bears, MD   10 mL at 02/19/14 1537    SURGICAL HISTORY:  Past Surgical History  Procedure Laterality Date  .  c section x 2    . Urethral cyst removed    . Tonsillectomy    . Appendectomy    . Colonoscopy with propofol N/A 07/17/2012    Procedure: COLONOSCOPY WITH PROPOFOL;  Surgeon: Garlan Fair, MD;  Location: [facility];  Service: Endoscopy;  Laterality: N/A;  . Esophagogastroduodenoscopy (egd) with propofol N/A 07/17/2012    Procedure: ESOPHAGOGASTRODUODENOSCOPY (EGD) WITH PROPOFOL;  Surgeon: Garlan Fair, MD;  Location:  [facility];  Service: Endoscopy;  Laterality: N/A;  . Video bronchoscopy with endobronchial ultrasound N/A 12/03/2013    Procedure: VIDEO BRONCHOSCOPY WITH ENDOBRONCHIAL ULTRASOUND WITH BIOPSIES;  Surgeon: Grace Isaac, MD;  Location: [facility];  Service: Thoracic;  Laterality: N/A;  . Portacath placement Left 12/10/2013    Procedure: INSERTION PORT-A-CATH;  Surgeon: Grace Isaac, MD;  Location: [facility];  Service: Thoracic;  Laterality: Left;    REVIEW OF SYSTEMS:  Constitutional: positive for fatigue Eyes: negative Ears, nose, mouth, throat, and face: negative Respiratory: positive for cough Cardiovascular: negative Gastrointestinal: negative Genitourinary:negative Integument/breast: negative Hematologic/lymphatic: negative Musculoskeletal:negative Neurological: negative Behavioral/Psych: negative Endocrine: negative Allergic/Immunologic: negative   PHYSICAL EXAMINATION: General appearance: alert, cooperative, fatigued and no distress Head: Normocephalic, without obvious abnormality, atraumatic Neck: no adenopathy, no JVD, supple, symmetrical, trachea midline and thyroid not enlarged, symmetric, no tenderness/mass/nodules Lymph nodes: Cervical, supraclavicular, and axillary nodes normal. Resp: clear to auscultation bilaterally Back: symmetric, no curvature. ROM normal. No CVA tenderness. Cardio: regular rate and rhythm, S1, S2 normal, no murmur, click, rub or gallop GI: soft, non-tender; bowel sounds normal; no masses,  no organomegaly Genitalia: defer exam Extremities: extremities normal, atraumatic, no cyanosis or edema Neurologic: Alert and oriented X 3, normal strength and tone. Normal symmetric reflexes. Normal coordination and gait  ECOG PERFORMANCE STATUS: 1 - Symptomatic but completely ambulatory  Blood pressure 121/69, pulse 97, temperature 98 F (36.7 C), temperature source Oral, resp. rate 18, height 5\' 2"  (1.575 m), weight 173 lb 12.8 oz (78.835 kg), SpO2 100  %.  LABORATORY DATA: Lab Results  Component Value Date   WBC 4.8 03/12/2014   HGB 9.3* 03/12/2014   HCT 28.3* 03/12/2014   MCV 98.6 03/12/2014   PLT 183 03/12/2014      Chemistry      Component Value Date/Time   NA 139 03/12/2014 1300   NA 138 12/10/2013 0637   K 3.8 03/12/2014 1300   K 4.7 12/10/2013 0637   CL 99 12/10/2013 0637   CO2 27 03/12/2014 1300   CO2 28 12/10/2013 0637   BUN 19.7 03/12/2014 1300   BUN 22 12/10/2013 0637   CREATININE 0.8 03/12/2014 1300   CREATININE 0.75 12/10/2013 0637   CREATININE 0.80 11/22/2013 1054      Component Value Date/Time   CALCIUM 8.6 03/12/2014 1300   CALCIUM 9.8 12/10/2013 0637   ALKPHOS 74 03/12/2014 1300   ALKPHOS 77 12/10/2013 0637   AST 19 03/12/2014 1300   AST 16 12/10/2013 0637   ALT 20 03/12/2014 1300   ALT 16 12/10/2013 0637   BILITOT 0.52 03/12/2014 1300   BILITOT 0.4 12/10/2013 6503       RADIOGRAPHIC STUDIES:  ASSESSMENT AND PLAN: This is a very pleasant 68 years old white female recently diagnosed with:  1) Stage IV non-small cell lung cancer, squamous cell carcinoma currently undergoing systemic chemotherapy with carboplatin and Abraxane status post 4 cycles. She is tolerating her treatment fairly well with no significant  adverse effects except for fatigue. I recommended for the patient to proceed with cycle #5 today as a scheduled.  2) chemotherapy-induced anemia: We will continue to monitor her closely and consider her for PRBCs transfusion if hemoglobin less than 8.0 g/dL. She will come back for follow-up visit in 3 weeks for reevaluation before starting cycle #6. The patient was advised to call immediately if she has any concerning symptoms in the interval.  The patient voices understanding of current disease status and treatment options and is in agreement with the current care plan.  All questions were answered. The patient knows to call the clinic with any problems, questions or concerns. We can  certainly see the patient much sooner if necessary.  Disclaimer: This note was dictated with voice recognition software. Similar sounding words can inadvertently be transcribed and may not be corrected upon review.

## 2014-03-19 ENCOUNTER — Ambulatory Visit (HOSPITAL_BASED_OUTPATIENT_CLINIC_OR_DEPARTMENT_OTHER): Payer: Medicare Other

## 2014-03-19 ENCOUNTER — Other Ambulatory Visit (HOSPITAL_BASED_OUTPATIENT_CLINIC_OR_DEPARTMENT_OTHER): Payer: Medicare Other

## 2014-03-19 DIAGNOSIS — C782 Secondary malignant neoplasm of pleura: Secondary | ICD-10-CM

## 2014-03-19 DIAGNOSIS — C3492 Malignant neoplasm of unspecified part of left bronchus or lung: Secondary | ICD-10-CM

## 2014-03-19 DIAGNOSIS — C3412 Malignant neoplasm of upper lobe, left bronchus or lung: Secondary | ICD-10-CM

## 2014-03-19 DIAGNOSIS — Z5111 Encounter for antineoplastic chemotherapy: Secondary | ICD-10-CM

## 2014-03-19 LAB — COMPREHENSIVE METABOLIC PANEL (CC13)
ALT: 24 U/L (ref 0–55)
ANION GAP: 12 meq/L — AB (ref 3–11)
AST: 22 U/L (ref 5–34)
Albumin: 3.8 g/dL (ref 3.5–5.0)
Alkaline Phosphatase: 76 U/L (ref 40–150)
BUN: 28.6 mg/dL — ABNORMAL HIGH (ref 7.0–26.0)
CALCIUM: 9.4 mg/dL (ref 8.4–10.4)
CHLORIDE: 100 meq/L (ref 98–109)
CO2: 27 meq/L (ref 22–29)
Creatinine: 1 mg/dL (ref 0.6–1.1)
EGFR: 57 mL/min/{1.73_m2} — ABNORMAL LOW (ref >90)
Glucose: 94 mg/dl (ref 70–140)
Potassium: 4.3 mEq/L (ref 3.5–5.1)
SODIUM: 139 meq/L (ref 136–145)
TOTAL PROTEIN: 6.7 g/dL (ref 6.4–8.3)
Total Bilirubin: 0.58 mg/dL (ref 0.20–1.20)

## 2014-03-19 LAB — CBC WITH DIFFERENTIAL/PLATELET
BASO%: 0.6 % (ref 0.0–2.0)
BASOS ABS: 0 10*3/uL (ref 0.0–0.1)
EOS%: 0.6 % (ref 0.0–7.0)
Eosinophils Absolute: 0 10*3/uL (ref 0.0–0.5)
HEMATOCRIT: 28 % — AB (ref 34.8–46.6)
HGB: 9.7 g/dL — ABNORMAL LOW (ref 11.6–15.9)
LYMPH#: 1.4 10*3/uL (ref 0.9–3.3)
LYMPH%: 25.9 % (ref 14.0–49.7)
MCH: 33.7 pg (ref 25.1–34.0)
MCHC: 34.6 g/dL (ref 31.5–36.0)
MCV: 97.2 fL (ref 79.5–101.0)
MONO#: 0.4 10*3/uL (ref 0.1–0.9)
MONO%: 7.9 % (ref 0.0–14.0)
NEUT#: 3.5 10*3/uL (ref 1.5–6.5)
NEUT%: 65 % (ref 38.4–76.8)
PLATELETS: 199 10*3/uL (ref 145–400)
RBC: 2.88 10*6/uL — ABNORMAL LOW (ref 3.70–5.45)
RDW: 21.4 % — ABNORMAL HIGH (ref 11.2–14.5)
WBC: 5.4 10*3/uL (ref 3.9–10.3)

## 2014-03-19 MED ORDER — HEPARIN SOD (PORK) LOCK FLUSH 100 UNIT/ML IV SOLN
500.0000 [IU] | Freq: Once | INTRAVENOUS | Status: AC | PRN
  Administered 2014-03-19: 500 [IU]
  Filled 2014-03-19: qty 5

## 2014-03-19 MED ORDER — DEXAMETHASONE SODIUM PHOSPHATE 10 MG/ML IJ SOLN
INTRAMUSCULAR | Status: AC
  Filled 2014-03-19: qty 1

## 2014-03-19 MED ORDER — DEXAMETHASONE SODIUM PHOSPHATE 10 MG/ML IJ SOLN
10.0000 mg | Freq: Once | INTRAMUSCULAR | Status: AC
  Administered 2014-03-19: 10 mg via INTRAVENOUS

## 2014-03-19 MED ORDER — ONDANSETRON 8 MG/NS 50 ML IVPB
INTRAVENOUS | Status: AC
  Filled 2014-03-19: qty 8

## 2014-03-19 MED ORDER — ONDANSETRON 8 MG/50ML IVPB (CHCC)
8.0000 mg | Freq: Once | INTRAVENOUS | Status: AC
Start: 2014-03-19 — End: 2014-03-19
  Administered 2014-03-19: 8 mg via INTRAVENOUS

## 2014-03-19 MED ORDER — SODIUM CHLORIDE 0.9 % IV SOLN
Freq: Once | INTRAVENOUS | Status: AC
  Administered 2014-03-19: 12:00:00 via INTRAVENOUS

## 2014-03-19 MED ORDER — PACLITAXEL PROTEIN-BOUND CHEMO INJECTION 100 MG
100.0000 mg/m2 | Freq: Once | INTRAVENOUS | Status: AC
  Administered 2014-03-19: 175 mg via INTRAVENOUS
  Filled 2014-03-19: qty 35

## 2014-03-19 MED ORDER — SODIUM CHLORIDE 0.9 % IJ SOLN
10.0000 mL | INTRAMUSCULAR | Status: DC | PRN
  Administered 2014-03-19: 10 mL
  Filled 2014-03-19: qty 10

## 2014-03-19 NOTE — Patient Instructions (Signed)
[facility] Discharge Instructions for Patients Receiving Chemotherapy  Today you received the following chemotherapy agents abraxane  To help prevent nausea and vomiting after your treatment, we encourage you to take your nausea medication as directed   If you develop nausea and vomiting that is not controlled by your nausea medication, call the clinic.   BELOW ARE SYMPTOMS THAT SHOULD BE REPORTED IMMEDIATELY:  *FEVER GREATER THAN 100.5 F  *CHILLS WITH OR WITHOUT FEVER  NAUSEA AND VOMITING THAT IS NOT CONTROLLED WITH YOUR NAUSEA MEDICATION  *UNUSUAL SHORTNESS OF BREATH  *UNUSUAL BRUISING OR BLEEDING  TENDERNESS IN MOUTH AND THROAT WITH OR WITHOUT PRESENCE OF ULCERS  *URINARY PROBLEMS  *BOWEL PROBLEMS  UNUSUAL RASH Items with * indicate a potential emergency and should be followed up as soon as possible.  Feel free to call the clinic you have any questions or concerns. The clinic phone number is [phone].

## 2014-03-26 ENCOUNTER — Other Ambulatory Visit (HOSPITAL_BASED_OUTPATIENT_CLINIC_OR_DEPARTMENT_OTHER): Payer: Medicare Other

## 2014-03-26 ENCOUNTER — Ambulatory Visit (HOSPITAL_BASED_OUTPATIENT_CLINIC_OR_DEPARTMENT_OTHER): Payer: Medicare Other

## 2014-03-26 DIAGNOSIS — Z5111 Encounter for antineoplastic chemotherapy: Secondary | ICD-10-CM

## 2014-03-26 DIAGNOSIS — C3492 Malignant neoplasm of unspecified part of left bronchus or lung: Secondary | ICD-10-CM

## 2014-03-26 DIAGNOSIS — C3412 Malignant neoplasm of upper lobe, left bronchus or lung: Secondary | ICD-10-CM

## 2014-03-26 DIAGNOSIS — C782 Secondary malignant neoplasm of pleura: Secondary | ICD-10-CM

## 2014-03-26 LAB — CBC WITH DIFFERENTIAL/PLATELET
BASO%: 0.6 % (ref 0.0–2.0)
BASOS ABS: 0 10*3/uL (ref 0.0–0.1)
EOS%: 0.6 % (ref 0.0–7.0)
Eosinophils Absolute: 0 10*3/uL (ref 0.0–0.5)
HCT: 25.9 % — ABNORMAL LOW (ref 34.8–46.6)
HGB: 8.5 g/dL — ABNORMAL LOW (ref 11.6–15.9)
LYMPH%: 35 % (ref 14.0–49.7)
MCH: 33.6 pg (ref 25.1–34.0)
MCHC: 33 g/dL (ref 31.5–36.0)
MCV: 101.7 fL — ABNORMAL HIGH (ref 79.5–101.0)
MONO#: 0.3 10*3/uL (ref 0.1–0.9)
MONO%: 10.3 % (ref 0.0–14.0)
NEUT%: 53.5 % (ref 38.4–76.8)
NEUTROS ABS: 1.7 10*3/uL (ref 1.5–6.5)
PLATELETS: 169 10*3/uL (ref 145–400)
RBC: 2.54 10*6/uL — ABNORMAL LOW (ref 3.70–5.45)
RDW: 24.6 % — AB (ref 11.2–14.5)
WBC: 3.2 10*3/uL — ABNORMAL LOW (ref 3.9–10.3)
lymph#: 1.1 10*3/uL (ref 0.9–3.3)

## 2014-03-26 LAB — COMPREHENSIVE METABOLIC PANEL (CC13)
ALBUMIN: 3.7 g/dL (ref 3.5–5.0)
ALK PHOS: 70 U/L (ref 40–150)
ALT: 22 U/L (ref 0–55)
AST: 21 U/L (ref 5–34)
Anion Gap: 11 mEq/L (ref 3–11)
BUN: 26 mg/dL (ref 7.0–26.0)
CALCIUM: 9 mg/dL (ref 8.4–10.4)
CO2: 26 mEq/L (ref 22–29)
Chloride: 103 mEq/L (ref 98–109)
Creatinine: 0.9 mg/dL (ref 0.6–1.1)
EGFR: 66 mL/min/{1.73_m2} — AB (ref >90)
GLUCOSE: 109 mg/dL (ref 70–140)
Potassium: 3.8 mEq/L (ref 3.5–5.1)
Sodium: 140 mEq/L (ref 136–145)
TOTAL PROTEIN: 6.3 g/dL — AB (ref 6.4–8.3)
Total Bilirubin: 0.72 mg/dL (ref 0.20–1.20)

## 2014-03-26 MED ORDER — ONDANSETRON 8 MG/50ML IVPB (CHCC)
8.0000 mg | Freq: Once | INTRAVENOUS | Status: AC
  Administered 2014-03-26: 8 mg via INTRAVENOUS

## 2014-03-26 MED ORDER — SODIUM CHLORIDE 0.9 % IJ SOLN
10.0000 mL | INTRAMUSCULAR | Status: DC | PRN
  Administered 2014-03-26: 10 mL
  Filled 2014-03-26: qty 10

## 2014-03-26 MED ORDER — PACLITAXEL PROTEIN-BOUND CHEMO INJECTION 100 MG
100.0000 mg/m2 | Freq: Once | INTRAVENOUS | Status: AC
Start: 2014-03-26 — End: 2014-03-26
  Administered 2014-03-26: 175 mg via INTRAVENOUS
  Filled 2014-03-26: qty 35

## 2014-03-26 MED ORDER — HEPARIN SOD (PORK) LOCK FLUSH 100 UNIT/ML IV SOLN
500.0000 [IU] | Freq: Once | INTRAVENOUS | Status: AC | PRN
  Administered 2014-03-26: 500 [IU]
  Filled 2014-03-26: qty 5

## 2014-03-26 MED ORDER — DEXAMETHASONE SODIUM PHOSPHATE 10 MG/ML IJ SOLN
10.0000 mg | Freq: Once | INTRAMUSCULAR | Status: AC
  Administered 2014-03-26: 10 mg via INTRAVENOUS

## 2014-03-26 MED ORDER — ONDANSETRON 8 MG/NS 50 ML IVPB
INTRAVENOUS | Status: AC
  Filled 2014-03-26: qty 8

## 2014-03-26 MED ORDER — SODIUM CHLORIDE 0.9 % IV SOLN
Freq: Once | INTRAVENOUS | Status: AC
  Administered 2014-03-26: 12:00:00 via INTRAVENOUS

## 2014-03-26 MED ORDER — DEXAMETHASONE SODIUM PHOSPHATE 10 MG/ML IJ SOLN
INTRAMUSCULAR | Status: AC
  Filled 2014-03-26: qty 1

## 2014-03-26 NOTE — Patient Instructions (Signed)
[facility] Discharge Instructions for Patients Receiving Chemotherapy  Today you received the following chemotherapy agents abraxane  To help prevent nausea and vomiting after your treatment, we encourage you to take your nausea medication as directed   If you develop nausea and vomiting that is not controlled by your nausea medication, call the clinic.   BELOW ARE SYMPTOMS THAT SHOULD BE REPORTED IMMEDIATELY:  *FEVER GREATER THAN 100.5 F  *CHILLS WITH OR WITHOUT FEVER  NAUSEA AND VOMITING THAT IS NOT CONTROLLED WITH YOUR NAUSEA MEDICATION  *UNUSUAL SHORTNESS OF BREATH  *UNUSUAL BRUISING OR BLEEDING  TENDERNESS IN MOUTH AND THROAT WITH OR WITHOUT PRESENCE OF ULCERS  *URINARY PROBLEMS  *BOWEL PROBLEMS  UNUSUAL RASH Items with * indicate a potential emergency and should be followed up as soon as possible.  Feel free to call the clinic you have any questions or concerns. The clinic phone number is [phone].

## 2014-04-02 ENCOUNTER — Ambulatory Visit (HOSPITAL_BASED_OUTPATIENT_CLINIC_OR_DEPARTMENT_OTHER): Payer: Medicare Other

## 2014-04-02 ENCOUNTER — Encounter: Payer: Self-pay | Admitting: Physician Assistant

## 2014-04-02 ENCOUNTER — Other Ambulatory Visit (HOSPITAL_BASED_OUTPATIENT_CLINIC_OR_DEPARTMENT_OTHER): Payer: Medicare Other

## 2014-04-02 ENCOUNTER — Telehealth: Payer: Self-pay | Admitting: *Deleted

## 2014-04-02 ENCOUNTER — Telehealth: Payer: Self-pay | Admitting: Physician Assistant

## 2014-04-02 ENCOUNTER — Ambulatory Visit (HOSPITAL_BASED_OUTPATIENT_CLINIC_OR_DEPARTMENT_OTHER): Payer: Medicare Other | Admitting: Physician Assistant

## 2014-04-02 VITALS — BP 103/61 | HR 99 | Temp 97.9°F | Resp 18 | Ht 62.0 in | Wt 176.1 lb

## 2014-04-02 DIAGNOSIS — C3412 Malignant neoplasm of upper lobe, left bronchus or lung: Secondary | ICD-10-CM

## 2014-04-02 DIAGNOSIS — C782 Secondary malignant neoplasm of pleura: Secondary | ICD-10-CM

## 2014-04-02 DIAGNOSIS — Z5111 Encounter for antineoplastic chemotherapy: Secondary | ICD-10-CM

## 2014-04-02 DIAGNOSIS — C3492 Malignant neoplasm of unspecified part of left bronchus or lung: Secondary | ICD-10-CM

## 2014-04-02 DIAGNOSIS — D6481 Anemia due to antineoplastic chemotherapy: Secondary | ICD-10-CM

## 2014-04-02 LAB — CBC WITH DIFFERENTIAL/PLATELET
BASO%: 1.3 % (ref 0.0–2.0)
Basophils Absolute: 0.1 10*3/uL (ref 0.0–0.1)
EOS%: 0.3 % (ref 0.0–7.0)
Eosinophils Absolute: 0 10*3/uL (ref 0.0–0.5)
HEMATOCRIT: 26.1 % — AB (ref 34.8–46.6)
HGB: 8.7 g/dL — ABNORMAL LOW (ref 11.6–15.9)
LYMPH%: 31.6 % (ref 14.0–49.7)
MCH: 34.6 pg — AB (ref 25.1–34.0)
MCHC: 33.4 g/dL (ref 31.5–36.0)
MCV: 103.4 fL — AB (ref 79.5–101.0)
MONO#: 0.4 10*3/uL (ref 0.1–0.9)
MONO%: 9.4 % (ref 0.0–14.0)
NEUT#: 2.3 10*3/uL (ref 1.5–6.5)
NEUT%: 57.4 % (ref 38.4–76.8)
Platelets: 139 10*3/uL — ABNORMAL LOW (ref 145–400)
RBC: 2.52 10*6/uL — ABNORMAL LOW (ref 3.70–5.45)
RDW: 24.8 % — ABNORMAL HIGH (ref 11.2–14.5)
WBC: 3.9 10*3/uL (ref 3.9–10.3)
lymph#: 1.2 10*3/uL (ref 0.9–3.3)

## 2014-04-02 LAB — COMPREHENSIVE METABOLIC PANEL (CC13)
ALK PHOS: 69 U/L (ref 40–150)
ALT: 19 U/L (ref 0–55)
ANION GAP: 12 meq/L — AB (ref 3–11)
AST: 19 U/L (ref 5–34)
Albumin: 3.7 g/dL (ref 3.5–5.0)
BUN: 22.9 mg/dL (ref 7.0–26.0)
CO2: 27 mEq/L (ref 22–29)
Calcium: 9 mg/dL (ref 8.4–10.4)
Chloride: 101 mEq/L (ref 98–109)
Creatinine: 1.1 mg/dL (ref 0.6–1.1)
EGFR: 51 mL/min/{1.73_m2} — AB (ref >90)
Glucose: 115 mg/dl (ref 70–140)
POTASSIUM: 4.2 meq/L (ref 3.5–5.1)
Sodium: 139 mEq/L (ref 136–145)
Total Bilirubin: 0.62 mg/dL (ref 0.20–1.20)
Total Protein: 6.3 g/dL — ABNORMAL LOW (ref 6.4–8.3)

## 2014-04-02 MED ORDER — SODIUM CHLORIDE 0.9 % IJ SOLN
10.0000 mL | INTRAMUSCULAR | Status: DC | PRN
  Administered 2014-04-02: 10 mL
  Filled 2014-04-02: qty 10

## 2014-04-02 MED ORDER — PACLITAXEL PROTEIN-BOUND CHEMO INJECTION 100 MG
100.0000 mg/m2 | Freq: Once | INTRAVENOUS | Status: AC
  Administered 2014-04-02: 175 mg via INTRAVENOUS
  Filled 2014-04-02: qty 35

## 2014-04-02 MED ORDER — HEPARIN SOD (PORK) LOCK FLUSH 100 UNIT/ML IV SOLN
500.0000 [IU] | Freq: Once | INTRAVENOUS | Status: AC | PRN
  Administered 2014-04-02: 500 [IU]
  Filled 2014-04-02: qty 5

## 2014-04-02 MED ORDER — SODIUM CHLORIDE 0.9 % IV SOLN
420.0000 mg | Freq: Once | INTRAVENOUS | Status: AC
  Administered 2014-04-02: 420 mg via INTRAVENOUS
  Filled 2014-04-02: qty 42

## 2014-04-02 MED ORDER — ONDANSETRON 16 MG/50ML IVPB (CHCC)
INTRAVENOUS | Status: AC
  Filled 2014-04-02: qty 16

## 2014-04-02 MED ORDER — DEXAMETHASONE SODIUM PHOSPHATE 20 MG/5ML IJ SOLN
INTRAMUSCULAR | Status: AC
  Filled 2014-04-02: qty 5

## 2014-04-02 MED ORDER — SODIUM CHLORIDE 0.9 % IV SOLN
Freq: Once | INTRAVENOUS | Status: AC
  Administered 2014-04-02: 16:00:00 via INTRAVENOUS

## 2014-04-02 MED ORDER — DEXAMETHASONE SODIUM PHOSPHATE 20 MG/5ML IJ SOLN
20.0000 mg | Freq: Once | INTRAMUSCULAR | Status: AC
  Administered 2014-04-02: 20 mg via INTRAVENOUS

## 2014-04-02 MED ORDER — ONDANSETRON 16 MG/50ML IVPB (CHCC)
16.0000 mg | Freq: Once | INTRAVENOUS | Status: AC
  Administered 2014-04-02: 16 mg via INTRAVENOUS

## 2014-04-02 NOTE — Patient Instructions (Signed)
[facility] Discharge Instructions for Patients Receiving Chemotherapy  Today you received the following chemotherapy agents: Abraxane, Carboplatin  To help prevent nausea and vomiting after your treatment, we encourage you to take your nausea medication:Compazine 10 mg every 6 hours.   If you develop nausea and vomiting that is not controlled by your nausea medication, call the clinic.   BELOW ARE SYMPTOMS THAT SHOULD BE REPORTED IMMEDIATELY:  *FEVER GREATER THAN 100.5 F  *CHILLS WITH OR WITHOUT FEVER  NAUSEA AND VOMITING THAT IS NOT CONTROLLED WITH YOUR NAUSEA MEDICATION  *UNUSUAL SHORTNESS OF BREATH  *UNUSUAL BRUISING OR BLEEDING  TENDERNESS IN MOUTH AND THROAT WITH OR WITHOUT PRESENCE OF ULCERS  *URINARY PROBLEMS  *BOWEL PROBLEMS  UNUSUAL RASH Items with * indicate a potential emergency and should be followed up as soon as possible.  Feel free to call the clinic you have any questions or concerns. The clinic phone number is [phone].

## 2014-04-02 NOTE — Telephone Encounter (Signed)
Per staff message and POF I have scheduled appts. Advised scheduler of appts. JMW  

## 2014-04-02 NOTE — Telephone Encounter (Signed)
Gave avs & calendar for March. Sent message to schedule.

## 2014-04-02 NOTE — Progress Notes (Addendum)
[place] [phone]   Fax:(336) [address] [address]  DIAGNOSIS: Stage IV (T2, N2, M1a) non-small cell lung cancer, squamous cell carcinoma presented with central left upper lobe lung mass with associated postobstructive atelectasis as well as ipsilateral and subcarinal lymphadenopathy as well as pleural tumor spread diagnosed in October 2015.  PRIOR THERAPY: None.  CURRENT THERAPY: Systemic chemotherapy with carboplatin for AUC of 5 on day 1 and Abraxane 100 MG/M2 on days 1, 8 and 15 every 3 weeks. Status post 5 cycles.   INTERVAL HISTORY: Tammie Clay 69 y.o. female returns to the clinic today for follow-up visit accompanied by her significant other. She is tolerating her current systemic chemotherapy with carboplatin and Abraxane fairly well. She reports increased fatigue and malaise and continues to have leg weakness.  She denied having any significant fever or chills. The patient denied having any significant chest pain, shortness of breath, cough or hemoptysis. She has no nausea or vomiting. She denied having any significant weight loss or night sweats.   MEDICAL HISTORY: Past Medical History  Diagnosis Date  . Hypertension   . COPD (chronic obstructive pulmonary disease)   . Hyperlipidemia   . GERD (gastroesophageal reflux disease)   . Osteopenia   . Insomnia   . Mediastinal lymphadenopathy 11/19/13    PER CT  . Multiple lung nodules on CT 11/19/13    LEFT LUNG BASE  . Lung mass 11/19/13    RUL PER CT  . Coronary artery calcification seen on CAT scan 11/21/2013  . COPD (chronic obstructive pulmonary disease) 11/21/2013  . Pneumonia     hx of  . Anxiety     ALLERGIES:  has No Known Allergies.  MEDICATIONS:  Current Outpatient Prescriptions  Medication Sig Dispense Refill  . albuterol (PROVENTIL HFA;VENTOLIN HFA) 108 (90 BASE) MCG/ACT inhaler  Inhale 2 puffs into the lungs every 6 (six) hours as needed for wheezing.    . diazepam (VALIUM) 5 MG tablet Take 5 mg by mouth at bedtime.    . DULoxetine (CYMBALTA) 30 MG capsule TAKE 1 CAPSULE (30 MG TOTAL) BY MOUTH DAILY. 30 capsule 2  . HYDROcodone-acetaminophen (NORCO/VICODIN) 5-325 MG per tablet Take 1-2 tablets by mouth every 6 (six) hours as needed for moderate pain.    Marland Kitchen lidocaine-prilocaine (EMLA) cream Apply 1 application topically as needed. 30 g 0  . lisinopril-hydrochlorothiazide (PRINZIDE,ZESTORETIC) 20-12.5 MG per tablet Take 2 tablets by mouth every morning.     . metoprolol succinate (TOPROL-XL) 50 MG 24 hr tablet Take 50 mg by mouth every morning. Take with or immediately following a meal.    . Multiple Vitamin (MULTIVITAMIN WITH MINERALS) TABS Take 1 tablet by mouth daily.    . prochlorperazine (COMPAZINE) 10 MG tablet Take 1 tablet (10 mg total) by mouth every 6 (six) hours as needed for nausea or vomiting. 30 tablet 0  . tiotropium (SPIRIVA) 18 MCG inhalation capsule Place 18 mcg into inhaler and inhale daily.     No current facility-administered medications for this visit.   Facility-Administered Medications Ordered in Other Visits  Medication Dose Route Frequency Provider Last Rate Last Dose  . CARBOplatin (PARAPLATIN) 420 mg in sodium chloride 0.9 % 250 mL chemo infusion  420 mg Intravenous Once Curt Bears, MD 584 mL/hr at 04/02/14 1654 420 mg at 04/02/14 1654  . heparin lock flush 100 unit/mL  500 Units Intracatheter  Once PRN Curt Bears, MD      . sodium chloride 0.9 % injection 10 mL  10 mL Intracatheter PRN Curt Bears, MD   10 mL at 02/19/14 1537  . sodium chloride 0.9 % injection 10 mL  10 mL Intracatheter PRN Curt Bears, MD        SURGICAL HISTORY:  Past Surgical History  Procedure Laterality Date  .  c section x 2    . Urethral cyst removed    . Tonsillectomy    . Appendectomy    . Colonoscopy with propofol N/A 07/17/2012    Procedure:  COLONOSCOPY WITH PROPOFOL;  Surgeon: Garlan Fair, MD;  Location: [facility];  Service: Endoscopy;  Laterality: N/A;  . Esophagogastroduodenoscopy (egd) with propofol N/A 07/17/2012    Procedure: ESOPHAGOGASTRODUODENOSCOPY (EGD) WITH PROPOFOL;  Surgeon: Garlan Fair, MD;  Location: [facility];  Service: Endoscopy;  Laterality: N/A;  . Video bronchoscopy with endobronchial ultrasound N/A 12/03/2013    Procedure: VIDEO BRONCHOSCOPY WITH ENDOBRONCHIAL ULTRASOUND WITH BIOPSIES;  Surgeon: Grace Isaac, MD;  Location: [place];  Service: Thoracic;  Laterality: N/A;  . Portacath placement Left 12/10/2013    Procedure: INSERTION PORT-A-CATH;  Surgeon: Grace Isaac, MD;  Location: [place];  Service: Thoracic;  Laterality: Left;    REVIEW OF SYSTEMS:  Constitutional: positive for fatigue and malaise Eyes: negative Ears, nose, mouth, throat, and face: negative Respiratory: positive for cough Cardiovascular: negative Gastrointestinal: negative Genitourinary:negative Integument/breast: negative Hematologic/lymphatic: negative Musculoskeletal:negative Neurological: positive for weakness Behavioral/Psych: negative Endocrine: negative Allergic/Immunologic: negative   PHYSICAL EXAMINATION: General appearance: alert, cooperative, fatigued and no distress Head: Normocephalic, without obvious abnormality, atraumatic Neck: no adenopathy, no JVD, supple, symmetrical, trachea midline and thyroid not enlarged, symmetric, no tenderness/mass/nodules Lymph nodes: Cervical, supraclavicular, and axillary nodes normal. Resp: clear to auscultation bilaterally Back: symmetric, no curvature. ROM normal. No CVA tenderness. Cardio: regular rate and rhythm, S1, S2 normal, no murmur, click, rub or gallop GI: soft, non-tender; bowel sounds normal; no masses,  no organomegaly Genitalia: defer exam Extremities: extremities normal, atraumatic, no cyanosis or edema Neurologic: Alert and oriented X 3, normal  strength and tone. Normal symmetric reflexes. Normal coordination and gait  ECOG PERFORMANCE STATUS: 1 - Symptomatic but completely ambulatory  Blood pressure 103/61, pulse 99, temperature 97.9 F (36.6 C), temperature source Oral, resp. rate 18, height 5\' 2"  (1.575 m), weight 176 lb 1.6 oz (79.878 kg), SpO2 100 %.  LABORATORY DATA: Lab Results  Component Value Date   WBC 3.9 04/02/2014   HGB 8.7* 04/02/2014   HCT 26.1* 04/02/2014   MCV 103.4* 04/02/2014   PLT 139* 04/02/2014      Chemistry      Component Value Date/Time   NA 139 04/02/2014 1340   NA 138 12/10/2013 0637   K 4.2 04/02/2014 1340   K 4.7 12/10/2013 0637   CL 99 12/10/2013 0637   CO2 27 04/02/2014 1340   CO2 28 12/10/2013 0637   BUN 22.9 04/02/2014 1340   BUN 22 12/10/2013 0637   CREATININE 1.1 04/02/2014 1340   CREATININE 0.75 12/10/2013 0637   CREATININE 0.80 11/22/2013 1054      Component Value Date/Time   CALCIUM 9.0 04/02/2014 1340   CALCIUM 9.8 12/10/2013 0637   ALKPHOS 69 04/02/2014 1340   ALKPHOS 77 12/10/2013 0637   AST 19 04/02/2014 1340   AST 16 12/10/2013 0637   ALT 19 04/02/2014 1340   ALT 16 12/10/2013 0637   BILITOT 0.62 04/02/2014 1340  BILITOT 0.4 12/10/2013 6837       RADIOGRAPHIC STUDIES:  ASSESSMENT AND PLAN: This is a very pleasant 69 years old white female recently diagnosed with:  1) Stage IV non-small cell lung cancer, squamous cell carcinoma currently undergoing systemic chemotherapy with carboplatin and Abraxane status post 5 cycles. She is tolerating her treatment fairly well with no significant adverse effects except for fatigue and malaise. She is also noticing some leg weakness however this has been a long-standing problem. Patient was discussed with and also seen by Dr. [name]. She'll proceed with cycle #6 today as scheduled.   2) chemotherapy-induced anemia: We will continue to monitor her closely and consider her for PRBCs transfusion if hemoglobin less than 8.0  g/dL.  She'll follow-up in 3 weeks with a restaging CT scan of the chest, abdomen and pelvis with contrast to reevaluate her disease. She will continue with weekly labs in the interim.  The patient was advised to call immediately if she has any concerning symptoms in the interval.  The patient voices understanding of current disease status and treatment options and is in agreement with the current care plan.  All questions were answered. The patient knows to call the clinic with any problems, questions or concerns. We can certainly see the patient much sooner if necessary.  Carlton Adam, PA-C 04/02/2014  ADDENDUM: Hematology/Oncology Attending: I had a face to face encounter with the patient. I recommended her care plan. This is a very pleasant 69 years old white female with a stage IV non-small cell lung cancer, squamous cell carcinoma currently undergoing systemic chemotherapy with carboplatin and Abraxane status post 5 cycles. The patient is tolerating her treatment fairly well except for increasing fatigue secondary to chemotherapy-induced anemia. She denied having any significant peripheral neuropathy. She has no nausea or vomiting. I recommended for the patient to proceed with cycle #6 as a scheduled. She will come back for follow-up visit in 3 weeks for evaluation after repeating CT scan of the chest, abdomen and pelvis for restaging of her disease. For the chemotherapy-induced anemia, we'll consider the patient for PRBCs transfusion as her hemoglobin decreases less than 8.0 g/dL. The patient was advised to call immediately if she has any concerning symptoms in the interval.  Disclaimer: This note was dictated with voice recognition software. Similar sounding words can inadvertently be transcribed and may not be corrected upon review. Eilleen Kempf., MD 04/08/2014

## 2014-04-08 NOTE — Patient Instructions (Signed)
Continue labs and chemotherapy as scheduled Follow up in 3 weeks with a restaging CT scan of your chest, abdomen and pelvis to re-evaluate your disease

## 2014-04-09 ENCOUNTER — Ambulatory Visit (HOSPITAL_BASED_OUTPATIENT_CLINIC_OR_DEPARTMENT_OTHER): Payer: Medicare Other

## 2014-04-09 ENCOUNTER — Ambulatory Visit (HOSPITAL_COMMUNITY)
Admission: RE | Admit: 2014-04-09 | Discharge: 2014-04-09 | Disposition: A | Discharge status: Home / Self Care | Payer: Medicare Other | Source: Ambulatory Visit | Attending: Internal Medicine | Admitting: Internal Medicine

## 2014-04-09 ENCOUNTER — Other Ambulatory Visit: Payer: Self-pay | Admitting: Medical Oncology

## 2014-04-09 ENCOUNTER — Other Ambulatory Visit (HOSPITAL_BASED_OUTPATIENT_CLINIC_OR_DEPARTMENT_OTHER): Payer: Medicare Other

## 2014-04-09 DIAGNOSIS — D6489 Other specified anemias: Secondary | ICD-10-CM

## 2014-04-09 DIAGNOSIS — D6481 Anemia due to antineoplastic chemotherapy: Secondary | ICD-10-CM

## 2014-04-09 DIAGNOSIS — C3492 Malignant neoplasm of unspecified part of left bronchus or lung: Secondary | ICD-10-CM

## 2014-04-09 DIAGNOSIS — T451X5A Adverse effect of antineoplastic and immunosuppressive drugs, initial encounter: Secondary | ICD-10-CM | POA: Insufficient documentation

## 2014-04-09 DIAGNOSIS — Z5111 Encounter for antineoplastic chemotherapy: Secondary | ICD-10-CM

## 2014-04-09 DIAGNOSIS — C3412 Malignant neoplasm of upper lobe, left bronchus or lung: Secondary | ICD-10-CM

## 2014-04-09 LAB — COMPREHENSIVE METABOLIC PANEL (CC13)
ALT: 15 U/L (ref 0–55)
ANION GAP: 14 meq/L — AB (ref 3–11)
AST: 21 U/L (ref 5–34)
Albumin: 3.6 g/dL (ref 3.5–5.0)
Alkaline Phosphatase: 74 U/L (ref 40–150)
BUN: 28 mg/dL — AB (ref 7.0–26.0)
CHLORIDE: 101 meq/L (ref 98–109)
CO2: 24 mEq/L (ref 22–29)
Calcium: 8.8 mg/dL (ref 8.4–10.4)
Creatinine: 1.1 mg/dL (ref 0.6–1.1)
EGFR: 53 mL/min/{1.73_m2} — ABNORMAL LOW (ref >90)
GLUCOSE: 131 mg/dL (ref 70–140)
Potassium: 4 mEq/L (ref 3.5–5.1)
Sodium: 139 mEq/L (ref 136–145)
Total Bilirubin: 0.65 mg/dL (ref 0.20–1.20)
Total Protein: 6.2 g/dL — ABNORMAL LOW (ref 6.4–8.3)

## 2014-04-09 LAB — CBC WITH DIFFERENTIAL/PLATELET
BASO%: 0.6 % (ref 0.0–2.0)
Basophils Absolute: 0 10*3/uL (ref 0.0–0.1)
EOS%: 0.4 % (ref 0.0–7.0)
Eosinophils Absolute: 0 10*3/uL (ref 0.0–0.5)
HEMATOCRIT: 23.6 % — AB (ref 34.8–46.6)
HGB: 7.7 g/dL — ABNORMAL LOW (ref 11.6–15.9)
LYMPH%: 29.6 % (ref 14.0–49.7)
MCH: 34.5 pg — AB (ref 25.1–34.0)
MCHC: 32.9 g/dL (ref 31.5–36.0)
MCV: 105 fL — ABNORMAL HIGH (ref 79.5–101.0)
MONO#: 0.3 10*3/uL (ref 0.1–0.9)
MONO%: 8.6 % (ref 0.0–14.0)
NEUT#: 1.9 10*3/uL (ref 1.5–6.5)
NEUT%: 60.8 % (ref 38.4–76.8)
Platelets: 118 10*3/uL — ABNORMAL LOW (ref 145–400)
RBC: 2.24 10*6/uL — AB (ref 3.70–5.45)
RDW: 22.8 % — ABNORMAL HIGH (ref 11.2–14.5)
WBC: 3.1 10*3/uL — ABNORMAL LOW (ref 3.9–10.3)
lymph#: 0.9 10*3/uL (ref 0.9–3.3)

## 2014-04-09 LAB — ABO/RH: ABO/RH(D): O POS

## 2014-04-09 LAB — PREPARE RBC (CROSSMATCH)

## 2014-04-09 MED ORDER — SODIUM CHLORIDE 0.9 % IJ SOLN
10.0000 mL | INTRAMUSCULAR | Status: DC | PRN
  Filled 2014-04-09: qty 10

## 2014-04-09 MED ORDER — ONDANSETRON 8 MG/50ML IVPB (CHCC)
8.0000 mg | Freq: Once | INTRAVENOUS | Status: AC
  Administered 2014-04-09: 8 mg via INTRAVENOUS

## 2014-04-09 MED ORDER — DIPHENHYDRAMINE HCL 25 MG PO CAPS
25.0000 mg | ORAL_CAPSULE | Freq: Once | ORAL | Status: DC
  Administered 2014-04-09: 25 mg via ORAL

## 2014-04-09 MED ORDER — DEXAMETHASONE SODIUM PHOSPHATE 10 MG/ML IJ SOLN
10.0000 mg | Freq: Once | INTRAMUSCULAR | Status: AC
  Administered 2014-04-09: 10 mg via INTRAVENOUS

## 2014-04-09 MED ORDER — SODIUM CHLORIDE 0.9 % IJ SOLN
10.0000 mL | INTRAMUSCULAR | Status: DC | PRN
  Administered 2014-04-09: 10 mL
  Filled 2014-04-09: qty 10

## 2014-04-09 MED ORDER — ACETAMINOPHEN 325 MG PO TABS
ORAL_TABLET | ORAL | Status: AC
  Filled 2014-04-09: qty 2

## 2014-04-09 MED ORDER — SODIUM CHLORIDE 0.9 % IV SOLN
250.0000 mL | Freq: Once | INTRAVENOUS | Status: AC
  Administered 2014-04-09: 250 mL via INTRAVENOUS

## 2014-04-09 MED ORDER — HEPARIN SOD (PORK) LOCK FLUSH 100 UNIT/ML IV SOLN
500.0000 [IU] | Freq: Every day | INTRAVENOUS | Status: DC | PRN
  Filled 2014-04-09: qty 5

## 2014-04-09 MED ORDER — ONDANSETRON 8 MG/NS 50 ML IVPB
INTRAVENOUS | Status: AC
  Filled 2014-04-09: qty 8

## 2014-04-09 MED ORDER — PACLITAXEL PROTEIN-BOUND CHEMO INJECTION 100 MG
100.0000 mg/m2 | Freq: Once | INTRAVENOUS | Status: AC
  Administered 2014-04-09: 175 mg via INTRAVENOUS
  Filled 2014-04-09: qty 35

## 2014-04-09 MED ORDER — DEXAMETHASONE SODIUM PHOSPHATE 10 MG/ML IJ SOLN
INTRAMUSCULAR | Status: AC
  Filled 2014-04-09: qty 1

## 2014-04-09 MED ORDER — HEPARIN SOD (PORK) LOCK FLUSH 100 UNIT/ML IV SOLN
500.0000 [IU] | Freq: Once | INTRAVENOUS | Status: AC | PRN
  Administered 2014-04-09: 500 [IU]
  Filled 2014-04-09: qty 5

## 2014-04-09 MED ORDER — DIPHENHYDRAMINE HCL 25 MG PO CAPS
ORAL_CAPSULE | ORAL | Status: AC
  Filled 2014-04-09: qty 1

## 2014-04-09 MED ORDER — ACETAMINOPHEN 325 MG PO TABS
650.0000 mg | ORAL_TABLET | Freq: Once | ORAL | Status: DC
  Administered 2014-04-09: 650 mg via ORAL

## 2014-04-09 MED ORDER — SODIUM CHLORIDE 0.9 % IV SOLN: Freq: Once | INTRAVENOUS | Status: DC

## 2014-04-09 NOTE — Progress Notes (Signed)
Hgb: 7.7. MD [facility] notified. Patient will receive 2 units of PRBCs. Ok to treat with Abraxane.

## 2014-04-09 NOTE — Patient Instructions (Addendum)
[place] Discharge Instructions for Patients Receiving Chemotherapy  Today you received the following chemotherapy agents Abraxane.   To help prevent nausea and vomiting after your treatment, we encourage you to take your nausea medication as directed.   If you develop nausea and vomiting that is not controlled by your nausea medication, call the clinic.   BELOW ARE SYMPTOMS THAT SHOULD BE REPORTED IMMEDIATELY:  *FEVER GREATER THAN 100.5 F  *CHILLS WITH OR WITHOUT FEVER  NAUSEA AND VOMITING THAT IS NOT CONTROLLED WITH YOUR NAUSEA MEDICATION  *UNUSUAL SHORTNESS OF BREATH  *UNUSUAL BRUISING OR BLEEDING  TENDERNESS IN MOUTH AND THROAT WITH OR WITHOUT PRESENCE OF ULCERS  *URINARY PROBLEMS  *BOWEL PROBLEMS  UNUSUAL RASH Items with * indicate a potential emergency and should be followed up as soon as possible.  Feel free to call the clinic you have any questions or concerns. The clinic phone number is [phone].   Blood Transfusion Information WHAT IS A BLOOD TRANSFUSION? A transfusion is the replacement of blood or some of its parts. Blood is made up of multiple cells which provide different functions.  Red blood cells carry oxygen and are used for blood loss replacement.  White blood cells fight against infection.  Platelets control bleeding.  Plasma helps clot blood.  Other blood products are available for specialized needs, such as hemophilia or other clotting disorders. BEFORE THE TRANSFUSION  Who gives blood for transfusions?   You may be able to donate blood to be used at a later date on yourself (autologous donation).  Relatives can be asked to donate blood. This is generally not any safer than if you have received blood from a stranger. The same precautions are taken to ensure safety when a relative's blood is donated.  Healthy volunteers who are fully evaluated to make sure their blood is safe. This is blood bank blood. Transfusion  therapy is the safest it has ever been in the practice of medicine. Before blood is taken from a donor, a complete history is taken to make sure that person has no history of diseases nor engages in risky social behavior (examples are intravenous drug use or sexual activity with multiple partners). The donor's travel history is screened to minimize risk of transmitting infections, such as malaria. The donated blood is tested for signs of infectious diseases, such as HIV and hepatitis. The blood is then tested to be sure it is compatible with you in order to minimize the chance of a transfusion reaction. If you or a relative donates blood, this is often done in anticipation of surgery and is not appropriate for emergency situations. It takes many days to process the donated blood. RISKS AND COMPLICATIONS Although transfusion therapy is very safe and saves many lives, the main dangers of transfusion include:   Getting an infectious disease.  Developing a transfusion reaction. This is an allergic reaction to something in the blood you were given. Every precaution is taken to prevent this. The decision to have a blood transfusion has been considered carefully by your caregiver before blood is given. Blood is not given unless the benefits outweigh the risks. AFTER THE TRANSFUSION  Right after receiving a blood transfusion, you will usually feel much better and more energetic. This is especially true if your red blood cells have gotten low (anemic). The transfusion raises the level of the red blood cells which carry oxygen, and this usually causes an energy increase.  The nurse administering the transfusion will monitor you  carefully for complications. HOME CARE INSTRUCTIONS  No special instructions are needed after a transfusion. You may find your energy is better. Speak with your caregiver about any limitations on activity for underlying diseases you may have. SEEK MEDICAL CARE IF:   Your condition is  not improving after your transfusion.  You develop redness or irritation at the intravenous (IV) site. SEEK IMMEDIATE MEDICAL CARE IF:  Any of the following symptoms occur over the next 12 hours:  Shaking chills.  You have a temperature by mouth above 102 F (38.9 C), not controlled by medicine.  Chest, back, or muscle pain.  People around you feel you are not acting correctly or are confused.  Shortness of breath or difficulty breathing.  Dizziness and fainting.  You get a rash or develop hives.  You have a decrease in urine output.  Your urine turns a dark color or changes to pink, red, or brown. Any of the following symptoms occur over the next 10 days:  You have a temperature by mouth above 102 F (38.9 C), not controlled by medicine.  Shortness of breath.  Weakness after normal activity.  The white part of the eye turns yellow (jaundice).  You have a decrease in the amount of urine or are urinating less often.  Your urine turns a dark color or changes to pink, red, or brown. Document Released: 01/22/2000 Document Revised: 04/18/2011 Document Reviewed: 09/10/2007 [facility] Patient Information 2015 [place], [place]. This information is not intended to replace advice given to you by your health care provider. Make sure you discuss any questions you have with your health care provider.

## 2014-04-09 NOTE — Addendum Note (Signed)
Addended by: Ardeen Garland on: 04/09/2014 12:16 PM   Modules accepted: Orders, SmartSet

## 2014-04-10 LAB — TYPE AND SCREEN
ABO/RH(D): O POS
ANTIBODY SCREEN: NEGATIVE
Unit division: 0
Unit division: 0

## 2014-04-16 ENCOUNTER — Other Ambulatory Visit (HOSPITAL_BASED_OUTPATIENT_CLINIC_OR_DEPARTMENT_OTHER): Payer: Medicare Other

## 2014-04-16 ENCOUNTER — Ambulatory Visit: Payer: Medicare Other

## 2014-04-16 DIAGNOSIS — C782 Secondary malignant neoplasm of pleura: Secondary | ICD-10-CM

## 2014-04-16 DIAGNOSIS — C3492 Malignant neoplasm of unspecified part of left bronchus or lung: Secondary | ICD-10-CM

## 2014-04-16 DIAGNOSIS — C3412 Malignant neoplasm of upper lobe, left bronchus or lung: Secondary | ICD-10-CM

## 2014-04-16 LAB — COMPREHENSIVE METABOLIC PANEL (CC13)
ALBUMIN: 3.4 g/dL — AB (ref 3.5–5.0)
ALK PHOS: 62 U/L (ref 40–150)
ALT: 20 U/L (ref 0–55)
AST: 19 U/L (ref 5–34)
Anion Gap: 10 mEq/L (ref 3–11)
BUN: 19 mg/dL (ref 7.0–26.0)
CHLORIDE: 104 meq/L (ref 98–109)
CO2: 26 mEq/L (ref 22–29)
Calcium: 8.6 mg/dL (ref 8.4–10.4)
Creatinine: 0.8 mg/dL (ref 0.6–1.1)
EGFR: 75 mL/min/{1.73_m2} — ABNORMAL LOW (ref >90)
Glucose: 101 mg/dl (ref 70–140)
Potassium: 4 mEq/L (ref 3.5–5.1)
Sodium: 140 mEq/L (ref 136–145)
Total Bilirubin: 0.73 mg/dL (ref 0.20–1.20)
Total Protein: 6.1 g/dL — ABNORMAL LOW (ref 6.4–8.3)

## 2014-04-16 LAB — CBC WITH DIFFERENTIAL/PLATELET
BASO%: 0.4 % (ref 0.0–2.0)
BASOS ABS: 0 10*3/uL (ref 0.0–0.1)
EOS ABS: 0 10*3/uL (ref 0.0–0.5)
EOS%: 0.4 % (ref 0.0–7.0)
HEMATOCRIT: 29.3 % — AB (ref 34.8–46.6)
HGB: 10 g/dL — ABNORMAL LOW (ref 11.6–15.9)
LYMPH%: 43 % (ref 14.0–49.7)
MCH: 33.9 pg (ref 25.1–34.0)
MCHC: 34.1 g/dL (ref 31.5–36.0)
MCV: 99.3 fL (ref 79.5–101.0)
MONO#: 0.4 10*3/uL (ref 0.1–0.9)
MONO%: 15.2 % — AB (ref 0.0–14.0)
NEUT#: 1 10*3/uL — ABNORMAL LOW (ref 1.5–6.5)
NEUT%: 41 % (ref 38.4–76.8)
PLATELETS: 94 10*3/uL — AB (ref 145–400)
RBC: 2.95 10*6/uL — ABNORMAL LOW (ref 3.70–5.45)
RDW: 20.1 % — AB (ref 11.2–14.5)
WBC: 2.4 10*3/uL — ABNORMAL LOW (ref 3.9–10.3)
lymph#: 1 10*3/uL (ref 0.9–3.3)
nRBC: 2 % — ABNORMAL HIGH (ref 0–0)

## 2014-04-16 NOTE — Progress Notes (Signed)
Met pt in lobby, due to Pass Christian 1.0, per Dr. [name], hold today's tx. Calendar given with appts and neutropenic precautions explained to pt. Pt voices understanding.

## 2014-04-18 ENCOUNTER — Ambulatory Visit (HOSPITAL_COMMUNITY)
Admission: RE | Admit: 2014-04-18 | Discharge: 2014-04-18 | Disposition: A | Discharge status: Home / Self Care | Payer: Medicare Other | Source: Ambulatory Visit | Attending: Physician Assistant | Admitting: Physician Assistant

## 2014-04-18 DIAGNOSIS — Z79899 Other long term (current) drug therapy: Secondary | ICD-10-CM | POA: Insufficient documentation

## 2014-04-18 DIAGNOSIS — C3492 Malignant neoplasm of unspecified part of left bronchus or lung: Secondary | ICD-10-CM | POA: Diagnosis present

## 2014-04-18 MED ORDER — IOHEXOL 300 MG/ML  SOLN
100.0000 mL | Freq: Once | INTRAMUSCULAR | Status: AC | PRN
  Administered 2014-04-18: 100 mL via INTRAVENOUS

## 2014-04-23 ENCOUNTER — Encounter: Payer: Self-pay | Admitting: Nurse Practitioner

## 2014-04-23 ENCOUNTER — Ambulatory Visit: Payer: Medicare Other

## 2014-04-23 ENCOUNTER — Other Ambulatory Visit (HOSPITAL_BASED_OUTPATIENT_CLINIC_OR_DEPARTMENT_OTHER): Payer: Medicare Other

## 2014-04-23 ENCOUNTER — Telehealth: Payer: Self-pay | Admitting: Nurse Practitioner

## 2014-04-23 ENCOUNTER — Ambulatory Visit (HOSPITAL_BASED_OUTPATIENT_CLINIC_OR_DEPARTMENT_OTHER): Payer: Medicare Other | Admitting: Nurse Practitioner

## 2014-04-23 VITALS — BP 125/67 | HR 108 | Temp 98.4°F | Resp 18 | Ht 62.0 in | Wt 176.2 lb

## 2014-04-23 DIAGNOSIS — C3492 Malignant neoplasm of unspecified part of left bronchus or lung: Secondary | ICD-10-CM

## 2014-04-23 DIAGNOSIS — C3412 Malignant neoplasm of upper lobe, left bronchus or lung: Secondary | ICD-10-CM

## 2014-04-23 DIAGNOSIS — C7800 Secondary malignant neoplasm of unspecified lung: Secondary | ICD-10-CM

## 2014-04-23 DIAGNOSIS — C787 Secondary malignant neoplasm of liver and intrahepatic bile duct: Secondary | ICD-10-CM

## 2014-04-23 LAB — COMPREHENSIVE METABOLIC PANEL (CC13)
ALBUMIN: 3.5 g/dL (ref 3.5–5.0)
ALT: 18 U/L (ref 0–55)
ANION GAP: 10 meq/L (ref 3–11)
AST: 17 U/L (ref 5–34)
Alkaline Phosphatase: 68 U/L (ref 40–150)
BILIRUBIN TOTAL: 0.66 mg/dL (ref 0.20–1.20)
BUN: 23.8 mg/dL (ref 7.0–26.0)
CHLORIDE: 100 meq/L (ref 98–109)
CO2: 30 meq/L — AB (ref 22–29)
Calcium: 9.3 mg/dL (ref 8.4–10.4)
Creatinine: 0.8 mg/dL (ref 0.6–1.1)
EGFR: 71 mL/min/{1.73_m2} — ABNORMAL LOW (ref >90)
Glucose: 103 mg/dl (ref 70–140)
Potassium: 4.1 mEq/L (ref 3.5–5.1)
SODIUM: 140 meq/L (ref 136–145)
TOTAL PROTEIN: 6.5 g/dL (ref 6.4–8.3)

## 2014-04-23 LAB — CBC WITH DIFFERENTIAL/PLATELET
BASO%: 0.7 % (ref 0.0–2.0)
Basophils Absolute: 0 10*3/uL (ref 0.0–0.1)
EOS%: 0.3 % (ref 0.0–7.0)
Eosinophils Absolute: 0 10*3/uL (ref 0.0–0.5)
HCT: 29.6 % — ABNORMAL LOW (ref 34.8–46.6)
HGB: 9.8 g/dL — ABNORMAL LOW (ref 11.6–15.9)
LYMPH%: 30.6 % (ref 14.0–49.7)
MCH: 34.2 pg — AB (ref 25.1–34.0)
MCHC: 33.1 g/dL (ref 31.5–36.0)
MCV: 103.3 fL — ABNORMAL HIGH (ref 79.5–101.0)
MONO#: 0.6 10*3/uL (ref 0.1–0.9)
MONO%: 15.3 % — ABNORMAL HIGH (ref 0.0–14.0)
NEUT#: 2.2 10*3/uL (ref 1.5–6.5)
NEUT%: 53.1 % (ref 38.4–76.8)
Platelets: 115 10*3/uL — ABNORMAL LOW (ref 145–400)
RBC: 2.87 10*6/uL — AB (ref 3.70–5.45)
RDW: 22.8 % — ABNORMAL HIGH (ref 11.2–14.5)
WBC: 4.2 10*3/uL (ref 3.9–10.3)
lymph#: 1.3 10*3/uL (ref 0.9–3.3)

## 2014-04-23 MED ORDER — PROCHLORPERAZINE MALEATE 10 MG PO TABS: 10.0000 mg | ORAL_TABLET | Freq: Four times a day (QID) | ORAL | Status: AC | PRN

## 2014-04-23 NOTE — Telephone Encounter (Signed)
Gave avs & calendar for May. °

## 2014-04-23 NOTE — Progress Notes (Addendum)
[place] OFFICE PROGRESS NOTE   Diagnosis:  Stage IV (T2, N2, M1a) non-small cell lung cancer, squamous cell carcinoma presented with central left upper lobe lung mass with associated postobstructive atelectasis as well as ipsilateral and subcarinal lymphadenopathy as well as pleural tumor spread diagnosed in October 2015.  PRIOR THERAPY: None.  CURRENT THERAPY: Systemic chemotherapy with carboplatin for AUC of 5 on day 1 and Abraxane 100 MG/M2 on days 1, 8 and 15 every 3 weeks. Status post 6 cycles.   INTERVAL HISTORY:   Ms. Ahart returns as scheduled. Cycle 6 day 15 Abraxane was held on 04/16/2014 due to neutropenia. She received a blood transfusion 04/09/2014. She remains fatigued. She has stable dyspnea on exertion. No cough or fever. She denies nausea/vomiting. No mouth sores. No diarrhea or constipation. No rash. She has persistent mild numbness in the fingertips and toes. She notes difficulty with her earrings.  Objective:  Vital signs in last 24 hours:  Blood pressure 125/67, pulse 108, temperature 98.4 F (36.9 C), temperature source Oral, resp. rate 18, height 5\' 2"  (1.575 m), weight 176 lb 3.2 oz (79.924 kg), SpO2 98 %.    HEENT: No thrush or ulcers. Lymphatics: No palpable cervical or supraclavicular lymph nodes. Resp: Lungs clear bilaterally. Cardio: Regular rate and rhythm. GI: Abdomen soft and nontender. No hepatomegaly. Vascular: No leg edema. Neuro: Vibratory sense mildly to moderately decreased over the fingertips per tuning fork exam.  Skin: No rash.   Lab Results:  Lab Results  Component Value Date   WBC 4.2 04/23/2014   HGB 9.8* 04/23/2014   HCT 29.6* 04/23/2014   MCV 103.3* 04/23/2014   PLT 115* 04/23/2014   NEUTROABS 2.2 04/23/2014    Imaging:  No results found.  Medications: I have reviewed the patient's current medications.  Assessment/Plan: 1. Stage IV non-small cell lung cancer, squamous cell carcinoma most recently  treated with carboplatin/Abraxane for 6 cycles. Restaging CT evaluation 04/18/2014 with stable disease and no new disease. 2. Anemia related to chemotherapy status post 2 units packed red blood cells 04/09/2014.   Disposition: Ms. Rice appears stable. She has completed 6 cycles of carboplatin/Abraxane. The recent restaging CT scans showed stable disease and no new disease. Dr. [name] discussed a treatment break versus "maintenance" Abraxane. It was decided that she will have a break from treatment and repeat CT scans at a two-month interval. She will return for a follow-up visit 2-3 days after the scans to review the results. She will contact the office in the interim with any problems.  Patient seen with Dr. [name].    [name] ANP/GNP-BC   04/23/2014  1:41 PM  ADDENDUM: Hematology/Oncology Attending:  I had a face to face encounter with the patient. I recommended her care plan. This is a very pleasant 69 years old white female with metastatic non-small cell lung cancer, squamous cell carcinoma recently completed 6 cycles of systemic chemotherapy with carboplatin and Abraxane with partial response after cycle #3 and a stable disease after cycle #6. The patient tolerated her previous treatment fairly well except for the fatigue from chemotherapy-induced anemia and she required PRBCs transfusion 2 weeks ago. The patient is feeling much better today. Her recent CT scan showed no evidence for disease progression. I had a lengthy discussion with the patient and her significant other about her current condition and treatment options. I gave the patient the option of continuous observation and close monitoring with repeat CT scan in 2 months versus consideration of maintenance  treatment with single agent Abraxane. The patient would like to proceed with the observation for now. We will see her back for follow-up visit in 2 months after repeating CT scan of the chest, abdomen and pelvis for  restaging of her disease. The patient was advised to call immediately if she has any concerning symptoms in the interval.  Disclaimer: This note was dictated with voice recognition software. Similar sounding words can inadvertently be transcribed and may be missed upon review. Eilleen Kempf., MD 04/23/2014

## 2014-04-24 ENCOUNTER — Telehealth: Payer: Self-pay | Admitting: *Deleted

## 2014-04-24 NOTE — Telephone Encounter (Signed)
WHEN CAN PT. GO TO THE DENTIST? CAN PT. HAVE A MANICURE AND PEDICURE? COULD PT. TAKE CLARITIN? IS PT.'S CANCER GONE? HAS THE STAGE OF PT.'S CANCER CHANGE? TALKED WITH DR.MOHAMED. HE WILL CALL PT. TODAY OR TOMORROW CONCERNING A POSSIBLE CLINICAL TRIAL. NOTIFIED PT. SHE VOICES UNDERSTANDING.

## 2014-04-25 ENCOUNTER — Encounter: Payer: Self-pay | Admitting: Medical Oncology

## 2014-04-30 ENCOUNTER — Other Ambulatory Visit (HOSPITAL_BASED_OUTPATIENT_CLINIC_OR_DEPARTMENT_OTHER): Payer: Medicare Other

## 2014-04-30 ENCOUNTER — Other Ambulatory Visit: Payer: Self-pay | Admitting: Medical Oncology

## 2014-04-30 ENCOUNTER — Other Ambulatory Visit: Payer: Self-pay

## 2014-04-30 ENCOUNTER — Encounter: Payer: Medicare Other | Admitting: Medical Oncology

## 2014-04-30 ENCOUNTER — Telehealth: Payer: Self-pay | Admitting: Medical Oncology

## 2014-04-30 ENCOUNTER — Other Ambulatory Visit (HOSPITAL_COMMUNITY)
Admission: RE | Admit: 2014-04-30 | Discharge: 2014-04-30 | Disposition: A | Discharge status: Home / Self Care | Payer: Medicare Other | Source: Ambulatory Visit | Attending: Internal Medicine | Admitting: Internal Medicine

## 2014-04-30 DIAGNOSIS — C3492 Malignant neoplasm of unspecified part of left bronchus or lung: Secondary | ICD-10-CM | POA: Insufficient documentation

## 2014-04-30 DIAGNOSIS — Z79899 Other long term (current) drug therapy: Secondary | ICD-10-CM

## 2014-04-30 DIAGNOSIS — Z87891 Personal history of nicotine dependence: Secondary | ICD-10-CM | POA: Diagnosis not present

## 2014-04-30 LAB — MAGNESIUM (CC13): Magnesium: 1.4 mg/dl — CL (ref 1.5–2.5)

## 2014-04-30 LAB — T3, FREE: T3, Free: 2.9 pg/mL (ref 2.3–4.2)

## 2014-04-30 LAB — CBC WITH DIFFERENTIAL/PLATELET
BASO%: 0.5 % (ref 0.0–2.0)
BASOS ABS: 0 10*3/uL (ref 0.0–0.1)
EOS ABS: 0 10*3/uL (ref 0.0–0.5)
EOS%: 0.5 % (ref 0.0–7.0)
HCT: 29.9 % — ABNORMAL LOW (ref 34.8–46.6)
HGB: 9.8 g/dL — ABNORMAL LOW (ref 11.6–15.9)
LYMPH#: 1.3 10*3/uL (ref 0.9–3.3)
LYMPH%: 25.2 % (ref 14.0–49.7)
MCH: 33.8 pg (ref 25.1–34.0)
MCHC: 32.8 g/dL (ref 31.5–36.0)
MCV: 103.1 fL — ABNORMAL HIGH (ref 79.5–101.0)
MONO#: 0.5 10*3/uL (ref 0.1–0.9)
MONO%: 9.3 % (ref 0.0–14.0)
NEUT#: 3.3 10*3/uL (ref 1.5–6.5)
NEUT%: 64.5 % (ref 38.4–76.8)
Platelets: 209 10*3/uL (ref 145–400)
RBC: 2.9 10*6/uL — AB (ref 3.70–5.45)
RDW: 23.2 % — ABNORMAL HIGH (ref 11.2–14.5)
WBC: 5.1 10*3/uL (ref 3.9–10.3)

## 2014-04-30 LAB — COMPREHENSIVE METABOLIC PANEL (CC13)
ALT: 17 U/L (ref 0–55)
ANION GAP: 10 meq/L (ref 3–11)
AST: 18 U/L (ref 5–34)
Albumin: 3.9 g/dL (ref 3.5–5.0)
Alkaline Phosphatase: 75 U/L (ref 40–150)
BUN: 19.8 mg/dL (ref 7.0–26.0)
CALCIUM: 9.6 mg/dL (ref 8.4–10.4)
CHLORIDE: 102 meq/L (ref 98–109)
CO2: 27 mEq/L (ref 22–29)
Creatinine: 0.8 mg/dL (ref 0.6–1.1)
EGFR: 72 mL/min/{1.73_m2} — AB (ref >90)
Glucose: 105 mg/dl (ref 70–140)
Potassium: 3.8 mEq/L (ref 3.5–5.1)
SODIUM: 139 meq/L (ref 136–145)
Total Bilirubin: 0.58 mg/dL (ref 0.20–1.20)
Total Protein: 6.9 g/dL (ref 6.4–8.3)

## 2014-04-30 LAB — PHOSPHORUS: PHOSPHORUS: 3.6 mg/dL (ref 2.3–4.6)

## 2014-04-30 LAB — LACTATE DEHYDROGENASE (CC13): LDH: 241 U/L (ref 125–245)

## 2014-04-30 LAB — HEPATITIS C ANTIBODY: HCV Ab: NEGATIVE

## 2014-04-30 LAB — HEPATITIS B SURFACE ANTIGEN: HEP B S AG: NEGATIVE

## 2014-04-30 LAB — TSH CHCC: TSH: 1.192 m(IU)/L (ref 0.308–3.960)

## 2014-04-30 LAB — T4, FREE: FREE T4: 1.18 ng/dL (ref 0.80–1.80)

## 2014-04-30 MED ORDER — MAGNESIUM OXIDE 400 (241.3 MG) MG PO TABS: 400.0000 mg | ORAL_TABLET | Freq: Three times a day (TID) | ORAL | Status: DC

## 2014-04-30 NOTE — Telephone Encounter (Signed)
Left message for pt to pick up mag oxide rx and to call for any questions.

## 2014-05-08 ENCOUNTER — Other Ambulatory Visit: Payer: Self-pay | Admitting: Medical Oncology

## 2014-05-08 ENCOUNTER — Other Ambulatory Visit: Payer: Self-pay | Admitting: Internal Medicine

## 2014-05-08 ENCOUNTER — Telehealth: Payer: Self-pay | Admitting: Internal Medicine

## 2014-05-08 DIAGNOSIS — C3492 Malignant neoplasm of unspecified part of left bronchus or lung: Secondary | ICD-10-CM

## 2014-05-08 NOTE — Telephone Encounter (Signed)
added appts...per mira everything ok...she will contact pt with appts.

## 2014-05-09 ENCOUNTER — Other Ambulatory Visit: Payer: Medicare Other

## 2014-05-09 ENCOUNTER — Encounter: Payer: Self-pay | Admitting: Nurse Practitioner

## 2014-05-09 ENCOUNTER — Other Ambulatory Visit (HOSPITAL_BASED_OUTPATIENT_CLINIC_OR_DEPARTMENT_OTHER): Payer: Medicare Other

## 2014-05-09 ENCOUNTER — Telehealth: Payer: Self-pay | Admitting: Internal Medicine

## 2014-05-09 ENCOUNTER — Encounter: Payer: Self-pay | Admitting: Internal Medicine

## 2014-05-09 ENCOUNTER — Ambulatory Visit (HOSPITAL_BASED_OUTPATIENT_CLINIC_OR_DEPARTMENT_OTHER): Payer: Medicare Other

## 2014-05-09 ENCOUNTER — Ambulatory Visit (HOSPITAL_BASED_OUTPATIENT_CLINIC_OR_DEPARTMENT_OTHER): Payer: Medicare Other | Admitting: Nurse Practitioner

## 2014-05-09 ENCOUNTER — Encounter: Payer: Self-pay | Admitting: Medical Oncology

## 2014-05-09 VITALS — BP 147/72 | HR 106 | Temp 98.1°F | Resp 18 | Ht 62.0 in | Wt 174.7 lb

## 2014-05-09 DIAGNOSIS — C3492 Malignant neoplasm of unspecified part of left bronchus or lung: Secondary | ICD-10-CM

## 2014-05-09 DIAGNOSIS — T451X5A Adverse effect of antineoplastic and immunosuppressive drugs, initial encounter: Secondary | ICD-10-CM

## 2014-05-09 DIAGNOSIS — K5900 Constipation, unspecified: Secondary | ICD-10-CM

## 2014-05-09 DIAGNOSIS — Z5112 Encounter for antineoplastic immunotherapy: Secondary | ICD-10-CM

## 2014-05-09 DIAGNOSIS — D6481 Anemia due to antineoplastic chemotherapy: Secondary | ICD-10-CM | POA: Diagnosis not present

## 2014-05-09 DIAGNOSIS — Z006 Encounter for examination for normal comparison and control in clinical research program: Secondary | ICD-10-CM

## 2014-05-09 LAB — CBC WITH DIFFERENTIAL/PLATELET
BASO%: 0.6 % (ref 0.0–2.0)
Basophils Absolute: 0 10*3/uL (ref 0.0–0.1)
EOS%: 0.8 % (ref 0.0–7.0)
Eosinophils Absolute: 0 10*3/uL (ref 0.0–0.5)
HCT: 31.5 % — ABNORMAL LOW (ref 34.8–46.6)
HGB: 10.4 g/dL — ABNORMAL LOW (ref 11.6–15.9)
LYMPH#: 1.3 10*3/uL (ref 0.9–3.3)
LYMPH%: 24.2 % (ref 14.0–49.7)
MCH: 34.4 pg — ABNORMAL HIGH (ref 25.1–34.0)
MCHC: 32.9 g/dL (ref 31.5–36.0)
MCV: 104.6 fL — ABNORMAL HIGH (ref 79.5–101.0)
MONO#: 0.5 10*3/uL (ref 0.1–0.9)
MONO%: 9.7 % (ref 0.0–14.0)
NEUT%: 64.7 % (ref 38.4–76.8)
NEUTROS ABS: 3.6 10*3/uL (ref 1.5–6.5)
Platelets: 274 10*3/uL (ref 145–400)
RBC: 3.01 10*6/uL — AB (ref 3.70–5.45)
RDW: 22 % — ABNORMAL HIGH (ref 11.2–14.5)
WBC: 5.5 10*3/uL (ref 3.9–10.3)

## 2014-05-09 LAB — COMPREHENSIVE METABOLIC PANEL (CC13)
ALT: 24 U/L (ref 0–55)
ANION GAP: 9 meq/L (ref 3–11)
AST: 19 U/L (ref 5–34)
Albumin: 3.8 g/dL (ref 3.5–5.0)
Alkaline Phosphatase: 86 U/L (ref 40–150)
BUN: 24.5 mg/dL (ref 7.0–26.0)
CO2: 31 mEq/L — ABNORMAL HIGH (ref 22–29)
Calcium: 9.9 mg/dL (ref 8.4–10.4)
Chloride: 100 mEq/L (ref 98–109)
Creatinine: 0.9 mg/dL (ref 0.6–1.1)
EGFR: 68 mL/min/{1.73_m2} — ABNORMAL LOW (ref >90)
Glucose: 115 mg/dl (ref 70–140)
Potassium: 3.9 mEq/L (ref 3.5–5.1)
Sodium: 140 mEq/L (ref 136–145)
Total Bilirubin: 0.36 mg/dL (ref 0.20–1.20)
Total Protein: 6.9 g/dL (ref 6.4–8.3)

## 2014-05-09 LAB — RESEARCH LABS

## 2014-05-09 MED ORDER — SODIUM CHLORIDE 0.9 % IV SOLN
240.0000 mg | Freq: Once | INTRAVENOUS | Status: AC
  Administered 2014-05-09: 240 mg via INTRAVENOUS
  Filled 2014-05-09: qty 24

## 2014-05-09 MED ORDER — SODIUM CHLORIDE 0.9 % IJ SOLN
10.0000 mL | INTRAMUSCULAR | Status: DC | PRN
Start: 2014-05-09 — End: 2014-05-09
  Administered 2014-05-09: 10 mL
  Filled 2014-05-09: qty 10

## 2014-05-09 MED ORDER — SODIUM CHLORIDE 0.9 % IV SOLN
Freq: Once | INTRAVENOUS | Status: AC
  Administered 2014-05-09: 15:00:00 via INTRAVENOUS

## 2014-05-09 MED ORDER — HEPARIN SOD (PORK) LOCK FLUSH 100 UNIT/ML IV SOLN
500.0000 [IU] | Freq: Once | INTRAVENOUS | Status: AC | PRN
  Administered 2014-05-09: 500 [IU]
  Filled 2014-05-09: qty 5

## 2014-05-09 NOTE — Telephone Encounter (Signed)
Appointments made and patient will get a new schedule and avs while seen today  anne

## 2014-05-09 NOTE — Progress Notes (Signed)
BMS [id number] - questionnaires (PROs) - patient into the cancer center for routine visit. Patient was given PROs upon arrival to the cancer center. The patient completed her PROs (EQ-5D-3L first and then the FACT-L) before any study procedures were performed. I checked the PROs for completeness.  Research blood was drawn.  The patient was thanked for her continued support of this clinical trial. Barb Kaeley Vinje 05/09/2014 01:11 PM

## 2014-05-09 NOTE — Progress Notes (Signed)
BMS [id number]: Group B: Arm A: Cycle 1/Day 1/Week 0 Patient arrived this afternoon for start of first treatment of Nivolumab. Patient completed FACT-L and EQ-5D questionnaire with Tammie Clay, Research Assistant, upon arrival to [facility]. Met with patient and Tammie Clay, significant other, after patient's completion of questionnaire and waited with patient till her lab appointment. Research blood and MD ordered labs drawn, pregnancy test not completed due to patient not of child-bearing age. Patient met with Tammie Fitz, NP for targeted physical exam, ECOG status, collection of weight, resting heart rate and oxygen level, temperature and respirations, as well as, any changes to baseline adverse events documented. Patient reports no new changes since her last visit with Tammie Card, NP and  Dr. Julien Clay on April 20, 2014. Per Tammie Fitz NP, patient ok to proceed with today's treatment, after assessment and review of labs. I provided patient with the study provided Nivolumab Participant Information and Alert Clay after reviewing both sides of Clay with her. [place] medication list collected .Treatment infusion sign given to Tammie Oliphant, RN. Patient's questions answered to her satisfaction. Patient knows to contact Dr. Julien Clay with any questions or concerns. Tammie Dan, RN, Clinical Research 05/09/2014 4:32 PM

## 2014-05-09 NOTE — Patient Instructions (Signed)
[place] Discharge Instructions for Patients Receiving Chemotherapy  Today you received the following chemotherapy agents Nivolumab  To help prevent nausea and vomiting after your treatment, we encourage you to take your nausea medication as needed   If you develop nausea and vomiting that is not controlled by your nausea medication, call the clinic.   BELOW ARE SYMPTOMS THAT SHOULD BE REPORTED IMMEDIATELY:  *FEVER GREATER THAN 100.5 F  *CHILLS WITH OR WITHOUT FEVER  NAUSEA AND VOMITING THAT IS NOT CONTROLLED WITH YOUR NAUSEA MEDICATION  *UNUSUAL SHORTNESS OF BREATH  *UNUSUAL BRUISING OR BLEEDING  TENDERNESS IN MOUTH AND THROAT WITH OR WITHOUT PRESENCE OF ULCERS  *URINARY PROBLEMS  *BOWEL PROBLEMS  UNUSUAL RASH Items with * indicate a potential emergency and should be followed up as soon as possible.  Feel free to call the clinic you have any questions or concerns. The clinic phone number is [phone].  Please show the [place] at check-in to the Emergency Department and triage nurse.

## 2014-05-09 NOTE — Progress Notes (Signed)
[place] OFFICE PROGRESS NOTE   Diagnosis:  Stage IV (T2, N2, M1a) non-small cell lung cancer, squamous cell carcinoma presented with central left upper lobe lung mass with associated postobstructive atelectasis as well as ipsilateral and subcarinal lymphadenopathy as well as pleural tumor spread diagnosed in October 2015.   PRIOR THERAPY: Systemic chemotherapy with carboplatin for AUC of 5 on day 1 and Abraxane 100 MG/M2 on days 1, 8 and 15 every 3 weeks. Status post 6 cycles.   CURRENT THERAPY: Nivolumab per research protocol: BMS 370 Group B, Arm A, given every 2 weeks. Today is day 1, cycle 1.  INTERVAL HISTORY:   Ms. Cavallaro returns today for follow up of her non-small cell lung cancer, accompanied by her husband. Since her last visit, she was accepted into a research study utilizing the drug Nivolumab, and she is here to begin cycle 1. Her main complaint has been fatigue. She will rest well at night, despite requiring a nap sometimes during the day. She denies fevers, chills, nausea or vomiting. She moves her bowels well with the use of miralax daily. Her appetite is good. 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 She is chronically short of breath, butt uses her symbicort and albuterol inhalers as advised. She denies cough, hemoptysis, or chest pain. Her eyes have been watering lately, but she attributes this to her allergies. She takes claratin daily.  Objective:  Vital signs in last 24 hours:  Blood pressure 147/72, pulse 106, temperature 98.1 F (36.7 C), temperature source Oral, resp. rate 18, height 5\' 2"  (1.575 m), weight 174 lb 11.2 oz (79.243 kg), SpO2 100 %.  ECOG performance status: 1-Restricted in physically strenuous activity but ambulatory and able to carry out work of a light or sedentary nature, e.g., light house work, office work  Skin:  warm, dry  HEENT: sclerae anicteric, conjunctivae pink, oropharynx clear. No thrush or mucositis.  Lymph Nodes: No cervical or supraclavicular lymphadenopathy  Lungs: clear to auscultation bilaterally, no rales, wheezes, or rhonci  Heart: regular rate and rhythm  Abdomen: round, soft, non tender, positive bowel sounds  Musculoskeletal: No focal spinal tenderness, no peripheral edema  Neuro: non focal, well oriented, positive affect    Lab Results:  Lab Results  Component Value Date   WBC 5.5 05/09/2014   HGB 10.4* 05/09/2014   HCT 31.5* 05/09/2014   MCV 104.6* 05/09/2014   PLT 274 05/09/2014   NEUTROABS 3.6 05/09/2014     Chemistry      Component Value Date/Time   NA 140 05/09/2014 1318   NA 138 12/10/2013 0637   K 3.9 05/09/2014 1318   K 4.7 12/10/2013 0637   CL 99 12/10/2013 0637   CO2 31* 05/09/2014 1318   CO2 28 12/10/2013 0637   BUN 24.5 05/09/2014 1318   BUN 22 12/10/2013 0637   CREATININE 0.9 05/09/2014 1318   CREATININE 0.75 12/10/2013 0637   CREATININE 0.80 11/22/2013 1054      Component Value Date/Time   CALCIUM 9.9 05/09/2014 1318   CALCIUM 9.8 12/10/2013 0637   ALKPHOS 86 05/09/2014 1318   ALKPHOS 77 12/10/2013 0637   AST 19 05/09/2014 1318   AST 16 12/10/2013 0637   ALT 24 05/09/2014 1318   ALT 16 12/10/2013 0637   BILITOT 0.36 05/09/2014 1318   BILITOT 0.4 12/10/2013 0637       Imaging:  No results  found.  Medications: I have reviewed the patient's current medications.  Assessment/Plan: 1. Stage IV non-small cell lung cancer, squamous cell carcinoma most recently treated with carboplatin/Abraxane for 6 cycles. To begin cycle 1 of nivolumab today, given every 2 weeks.  2. Constipation: continue miralax daily 3. Grade 1 fatigue: continue to monitor, rest when necessary 4. Grade 1 neuropathy: continue to monitor 5. Treatment related anemia: hgb 10.4, stable, continue to monitor    Disposition:  Ms. Blakley is doing well today. The labs were  reviewed in detail and were entirely stable. She will proceed with cycle 1 of nivolumab as planned today. She will continue with this treatment every 2 weeks, and will return for office visits with the same frequency. She understands and agrees with this plan. She has been encouraged to call with any issues that might arise before her next visit here.   Laurie Panda, NP 05/09/2014

## 2014-05-12 ENCOUNTER — Other Ambulatory Visit: Payer: Medicare Other

## 2014-05-13 ENCOUNTER — Encounter: Payer: Self-pay | Admitting: Medical Oncology

## 2014-05-13 ENCOUNTER — Telehealth: Payer: Self-pay | Admitting: Medical Oncology

## 2014-05-13 ENCOUNTER — Telehealth: Payer: Self-pay | Admitting: *Deleted

## 2014-05-13 NOTE — Telephone Encounter (Signed)
Called patient to follow up on 1st chemo. Pt states she tolerated it well. Denies N/V or diarrhea. Drinking fluids well. No questions or concerns at this time

## 2014-05-13 NOTE — Progress Notes (Signed)
BMS [id number]: Group B: Arm A At Screening, due to no option for this group to document that ALK and EGFR testing not completed and per protocol ALK and EGFR testing are not required for Group B, per Gertie Exon, BS, Clinical Trial Manager, was informed to document in [facility] as EGFR for this patient. Also per Tilford Pillar, since this patient has not had prior radiation treatment and in [place] no option to document this, enrolled patient, as instructed, using the ablative option. Screening and enrollment completed in [facility] as instructed by Charissa. [name], RN, Clinical Research 05/13/2014 11:53 AM

## 2014-05-13 NOTE — Telephone Encounter (Signed)
BMS 370, Group B: Arm A Follow up call to patient to inquire how she was feeling after Cycle 1 Week 1 Day 1 on Friday 04/01. Spoke with patient's significant other, Mr. Tammie Clay, patient was in restroom when I called. Per Mr. Tammie Michaelis, Tammie Clay did very well and has had no complaints. Mr. Tammie Clay does state patient continues with history of morning aches to both feet and states she takes two tylenol and ache goes away by noon. No other complaints noted and denies questions at this time. Encouraged Mr. Tammie Clay and patient to contact me or Dr. [name] with any questions and concerns. Thanked him for his time.  [name], RN, Clinical Research 05/13/2014 5:04 PM

## 2014-05-20 ENCOUNTER — Other Ambulatory Visit: Payer: Self-pay | Admitting: *Deleted

## 2014-05-20 ENCOUNTER — Other Ambulatory Visit: Payer: Self-pay | Admitting: Medical Oncology

## 2014-05-20 DIAGNOSIS — C3492 Malignant neoplasm of unspecified part of left bronchus or lung: Secondary | ICD-10-CM

## 2014-05-21 ENCOUNTER — Other Ambulatory Visit (HOSPITAL_BASED_OUTPATIENT_CLINIC_OR_DEPARTMENT_OTHER): Payer: Medicare Other

## 2014-05-21 ENCOUNTER — Telehealth: Payer: Self-pay | Admitting: Physician Assistant

## 2014-05-21 ENCOUNTER — Encounter: Payer: Self-pay | Admitting: Medical Oncology

## 2014-05-21 ENCOUNTER — Ambulatory Visit (HOSPITAL_BASED_OUTPATIENT_CLINIC_OR_DEPARTMENT_OTHER): Payer: Medicare Other

## 2014-05-21 ENCOUNTER — Encounter: Payer: Self-pay | Admitting: Physician Assistant

## 2014-05-21 ENCOUNTER — Telehealth: Payer: Self-pay | Admitting: *Deleted

## 2014-05-21 ENCOUNTER — Other Ambulatory Visit (HOSPITAL_COMMUNITY)
Admission: RE | Admit: 2014-05-21 | Discharge: 2014-05-21 | Disposition: A | Discharge status: Home / Self Care | Payer: Medicare Other | Source: Ambulatory Visit | Attending: Internal Medicine | Admitting: Internal Medicine

## 2014-05-21 ENCOUNTER — Ambulatory Visit (HOSPITAL_BASED_OUTPATIENT_CLINIC_OR_DEPARTMENT_OTHER): Payer: Medicare Other | Admitting: Physician Assistant

## 2014-05-21 VITALS — BP 101/57 | HR 81 | Temp 98.0°F | Resp 16 | Ht 62.0 in | Wt 175.2 lb

## 2014-05-21 DIAGNOSIS — D6481 Anemia due to antineoplastic chemotherapy: Secondary | ICD-10-CM

## 2014-05-21 DIAGNOSIS — C3492 Malignant neoplasm of unspecified part of left bronchus or lung: Secondary | ICD-10-CM

## 2014-05-21 DIAGNOSIS — Z006 Encounter for examination for normal comparison and control in clinical research program: Secondary | ICD-10-CM

## 2014-05-21 DIAGNOSIS — Z5112 Encounter for antineoplastic immunotherapy: Secondary | ICD-10-CM | POA: Diagnosis not present

## 2014-05-21 DIAGNOSIS — C3490 Malignant neoplasm of unspecified part of unspecified bronchus or lung: Secondary | ICD-10-CM | POA: Diagnosis present

## 2014-05-21 DIAGNOSIS — Z87891 Personal history of nicotine dependence: Secondary | ICD-10-CM | POA: Insufficient documentation

## 2014-05-21 LAB — MAGNESIUM (CC13): Magnesium: 2.1 mg/dl (ref 1.5–2.5)

## 2014-05-21 LAB — COMPREHENSIVE METABOLIC PANEL (CC13)
ALBUMIN: 3.7 g/dL (ref 3.5–5.0)
ALT: 31 U/L (ref 0–55)
AST: 28 U/L (ref 5–34)
Alkaline Phosphatase: 83 U/L (ref 40–150)
Anion Gap: 10 mEq/L (ref 3–11)
BUN: 19.1 mg/dL (ref 7.0–26.0)
CHLORIDE: 100 meq/L (ref 98–109)
CO2: 31 mEq/L — ABNORMAL HIGH (ref 22–29)
Calcium: 9.7 mg/dL (ref 8.4–10.4)
Creatinine: 1 mg/dL (ref 0.6–1.1)
EGFR: 60 mL/min/{1.73_m2} — AB (ref >90)
GLUCOSE: 128 mg/dL (ref 70–140)
POTASSIUM: 4 meq/L (ref 3.5–5.1)
Sodium: 140 mEq/L (ref 136–145)
TOTAL PROTEIN: 7 g/dL (ref 6.4–8.3)
Total Bilirubin: 0.32 mg/dL (ref 0.20–1.20)

## 2014-05-21 LAB — CBC WITH DIFFERENTIAL/PLATELET
BASO%: 1 % (ref 0.0–2.0)
Basophils Absolute: 0.1 10*3/uL (ref 0.0–0.1)
EOS ABS: 0.1 10*3/uL (ref 0.0–0.5)
EOS%: 1 % (ref 0.0–7.0)
HCT: 33.1 % — ABNORMAL LOW (ref 34.8–46.6)
HGB: 10.9 g/dL — ABNORMAL LOW (ref 11.6–15.9)
LYMPH%: 20.7 % (ref 14.0–49.7)
MCH: 34.3 pg — AB (ref 25.1–34.0)
MCHC: 33 g/dL (ref 31.5–36.0)
MCV: 104 fL — ABNORMAL HIGH (ref 79.5–101.0)
MONO#: 0.5 10*3/uL (ref 0.1–0.9)
MONO%: 7.5 % (ref 0.0–14.0)
NEUT%: 69.8 % (ref 38.4–76.8)
NEUTROS ABS: 4.4 10*3/uL (ref 1.5–6.5)
PLATELETS: 292 10*3/uL (ref 145–400)
RBC: 3.18 10*6/uL — ABNORMAL LOW (ref 3.70–5.45)
RDW: 20.2 % — ABNORMAL HIGH (ref 11.2–14.5)
WBC: 6.4 10*3/uL (ref 3.9–10.3)
lymph#: 1.3 10*3/uL (ref 0.9–3.3)

## 2014-05-21 LAB — PHOSPHORUS: Phosphorus: 3.3 mg/dL (ref 2.3–4.6)

## 2014-05-21 LAB — LACTATE DEHYDROGENASE (CC13): LDH: 209 U/L (ref 125–245)

## 2014-05-21 MED ORDER — SODIUM CHLORIDE 0.9 % IV SOLN
Freq: Once | INTRAVENOUS | Status: AC
  Administered 2014-05-21: 15:00:00 via INTRAVENOUS

## 2014-05-21 MED ORDER — SODIUM CHLORIDE 0.9 % IJ SOLN
10.0000 mL | INTRAMUSCULAR | Status: DC | PRN
Start: 2014-05-21 — End: 2014-05-21
  Administered 2014-05-21: 10 mL
  Filled 2014-05-21: qty 10

## 2014-05-21 MED ORDER — SODIUM CHLORIDE 0.9 % IV SOLN
240.0000 mg | Freq: Once | INTRAVENOUS | Status: AC
  Administered 2014-05-21: 240 mg via INTRAVENOUS
  Filled 2014-05-21: qty 24

## 2014-05-21 MED ORDER — HEPARIN SOD (PORK) LOCK FLUSH 100 UNIT/ML IV SOLN
500.0000 [IU] | Freq: Once | INTRAVENOUS | Status: AC | PRN
  Administered 2014-05-21: 500 [IU]
  Filled 2014-05-21: qty 5

## 2014-05-21 NOTE — Telephone Encounter (Signed)
Per staff message and POF I have scheduled appts. Advised scheduler of appts. JMW  

## 2014-05-21 NOTE — Progress Notes (Signed)
Dr. [name] here to see patient before treatment.

## 2014-05-21 NOTE — Telephone Encounter (Signed)
Pt confirmed labs/ov per 04/13 POF, gave pt AVS and Calendar.... KJ, sent msg to add chemo

## 2014-05-21 NOTE — Patient Instructions (Signed)
[facility] Discharge Instructions for Patients Receiving Chemotherapy  Today you received the following chemotherapy agents Nivolumab  To help prevent nausea and vomiting after your treatment, we encourage you to take your nausea medication as needed   If you develop nausea and vomiting that is not controlled by your nausea medication, call the clinic.   BELOW ARE SYMPTOMS THAT SHOULD BE REPORTED IMMEDIATELY:  *FEVER GREATER THAN 100.5 F  *CHILLS WITH OR WITHOUT FEVER  NAUSEA AND VOMITING THAT IS NOT CONTROLLED WITH YOUR NAUSEA MEDICATION  *UNUSUAL SHORTNESS OF BREATH  *UNUSUAL BRUISING OR BLEEDING  TENDERNESS IN MOUTH AND THROAT WITH OR WITHOUT PRESENCE OF ULCERS  *URINARY PROBLEMS  *BOWEL PROBLEMS  UNUSUAL RASH Items with * indicate a potential emergency and should be followed up as soon as possible.  Feel free to call the clinic you have any questions or concerns. The clinic phone number is [phone].  Please show the [facility] at check-in to the Emergency Department and triage nurse.

## 2014-05-21 NOTE — Progress Notes (Addendum)
[place] [phone]   Fax:([phone]) [place] [address] [place] [address]  DIAGNOSIS: Stage IV (T2, N2, M1a) non-small cell lung cancer, squamous cell carcinoma presented with central left upper lobe lung mass with associated postobstructive atelectasis as well as ipsilateral and subcarinal lymphadenopathy as well as pleural tumor spread diagnosed in October 2015.  PRIOR THERAPY: Systemic chemotherapy with carboplatin for AUC of 5 on day 1 and Abraxane 100 MG/M2 on days 1, 8 and 15 every 3 weeks. Status post 6 cycles.   CURRENT THERAPY: Nivolumab  per research protocol BMS 370 group E, arm A, given every 2 weeks. Status post 1 cycle  INTERVAL HISTORY: Tammie Clay 69 y.o. female returns to the clinic today for follow-up visit accompanied by her significant other. She tolerated her first cycle of  Nivolumab without difficulty. Specifically she had no increased shortness of breath, skin rash or diarrhea. She presents today for cycle #2.  She denied having any significant fever or chills. The patient denied having any significant chest pain, shortness of breath, cough or hemoptysis. She has no nausea or vomiting. She denied having any significant weight loss or night sweats. She continues to have some fatigue.  MEDICAL HISTORY: Past Medical History  Diagnosis Date  . Hypertension   . COPD (chronic obstructive pulmonary disease)   . Hyperlipidemia   . GERD (gastroesophageal reflux disease)   . Osteopenia   . Insomnia   . Mediastinal lymphadenopathy 11/19/13    PER CT  . Multiple lung nodules on CT 11/19/13    LEFT LUNG BASE  . Lung mass 11/19/13    RUL PER CT  . Coronary artery calcification seen on CAT scan 11/21/2013  . COPD (chronic obstructive pulmonary disease) 11/21/2013  . Pneumonia     hx of  . Anxiety     ALLERGIES:  has No Known Allergies.  MEDICATIONS:  Current  Outpatient Prescriptions  Medication Sig Dispense Refill  . acetaminophen (TYLENOL) 325 MG tablet Take 650 mg by mouth. Take 2 tablets every morning    . albuterol (PROVENTIL HFA;VENTOLIN HFA) 108 (90 BASE) MCG/ACT inhaler Inhale 2 puffs into the lungs every 6 (six) hours as needed for wheezing.    . budesonide-formoterol (SYMBICORT) 160-4.5 MCG/ACT inhaler Inhale 2 puffs into the lungs 2 (two) times daily.    . calcium carbonate (TUMS - DOSED IN MG ELEMENTAL CALCIUM) 500 MG chewable tablet Chew 1 tablet by mouth daily.    . diazepam (VALIUM) 5 MG tablet Take 5 mg by mouth at bedtime.    . docusate sodium (COLACE) 100 MG capsule Take 100 mg by mouth 2 (two) times daily.    . DULoxetine (CYMBALTA) 30 MG capsule TAKE 1 CAPSULE (30 MG TOTAL) BY MOUTH DAILY. 30 capsule 2  . HYDROcodone-acetaminophen (NORCO) 10-325 MG per tablet Take 1 tablet by mouth every 6 (six) hours as needed.    . lidocaine-prilocaine (EMLA) cream Apply 1 application topically as needed. 30 g 0  . lisinopril-hydrochlorothiazide (PRINZIDE,ZESTORETIC) 20-12.5 MG per tablet Take 2 tablets by mouth every morning.     . loratadine (CLARITIN) 10 MG tablet Take 10 mg by mouth daily.    . magnesium oxide (MAG-OX) 400 (241.3 MG) MG tablet Take 1 tablet (400 mg total) by mouth 3 (three) times daily. 90 tablet 0  . metoprolol succinate (TOPROL-XL) 50 MG 24 hr tablet Take 50 mg by mouth every  morning. Take with or immediately following a meal.    . Multiple Vitamin (MULTIVITAMIN WITH MINERALS) TABS Take 1 tablet by mouth daily.    . polyethylene glycol (MIRALAX / GLYCOLAX) packet Take 17 g by mouth daily as needed.    . prochlorperazine (COMPAZINE) 10 MG tablet Take 1 tablet (10 mg total) by mouth every 6 (six) hours as needed for nausea or vomiting. 30 tablet 1  . ranitidine (ZANTAC) 150 MG tablet Take 150 mg by mouth 2 (two) times daily.    . SYMBICORT 80-4.5 MCG/ACT inhaler   3   No current facility-administered medications for this  visit.   Facility-Administered Medications Ordered in Other Visits  Medication Dose Route Frequency Provider Last Rate Last Dose  . 0.9 %  sodium chloride infusion   Intravenous Once Curt Bears, MD      . heparin lock flush 100 unit/mL  500 Units Intracatheter Once PRN Curt Bears, MD      . INV-nivolumab BMS CA 209-370 (OPDIVO) 240 mg in sodium chloride 0.9 % 100 mL (1.9355 mg/mL) chemo infusion  240 mg Intravenous Once Curt Bears, MD      . sodium chloride 0.9 % injection 10 mL  10 mL Intracatheter PRN Curt Bears, MD        SURGICAL HISTORY:  Past Surgical History  Procedure Laterality Date  .  c section x 2    . Urethral cyst removed    . Tonsillectomy    . Appendectomy    . Colonoscopy with propofol N/A 07/17/2012    Procedure: COLONOSCOPY WITH PROPOFOL;  Surgeon: Garlan Fair, MD;  Location: [facility];  Service: Endoscopy;  Laterality: N/A;  . Esophagogastroduodenoscopy (egd) with propofol N/A 07/17/2012    Procedure: ESOPHAGOGASTRODUODENOSCOPY (EGD) WITH PROPOFOL;  Surgeon: Garlan Fair, MD;  Location: [facility];  Service: Endoscopy;  Laterality: N/A;  . Video bronchoscopy with endobronchial ultrasound N/A 12/03/2013    Procedure: VIDEO BRONCHOSCOPY WITH ENDOBRONCHIAL ULTRASOUND WITH BIOPSIES;  Surgeon: Grace Isaac, MD;  Location: [facility];  Service: Thoracic;  Laterality: N/A;  . Portacath placement Left 12/10/2013    Procedure: INSERTION PORT-A-CATH;  Surgeon: Grace Isaac, MD;  Location: [facility];  Service: Thoracic;  Laterality: Left;    REVIEW OF SYSTEMS:  Constitutional: positive for fatigue Eyes: negative Ears, nose, mouth, throat, and face: negative Respiratory: negative Cardiovascular: negative Gastrointestinal: negative Genitourinary:negative Integument/breast: negative Hematologic/lymphatic: negative Musculoskeletal:negative Neurological: negative Behavioral/Psych: negative Endocrine: negative Allergic/Immunologic: negative     PHYSICAL EXAMINATION: General appearance: alert, cooperative, fatigued and no distress Head: Normocephalic, without obvious abnormality, atraumatic Neck: no adenopathy, no JVD, supple, symmetrical, trachea midline and thyroid not enlarged, symmetric, no tenderness/mass/nodules Lymph nodes: Cervical, supraclavicular, and axillary nodes normal. Resp: clear to auscultation bilaterally Back: symmetric, no curvature. ROM normal. No CVA tenderness. Cardio: regular rate and rhythm, S1, S2 normal, no murmur, click, rub or gallop GI: soft, non-tender; bowel sounds normal; no masses,  no organomegaly Genitalia: defer exam Extremities: extremities normal, atraumatic, no cyanosis or edema Neurologic: Alert and oriented X 3, normal strength and tone. Normal symmetric reflexes. Normal coordination and gait  ECOG PERFORMANCE STATUS: 1 - Symptomatic but completely ambulatory  Blood pressure 101/57, pulse 81, temperature 98 F (36.7 C), temperature source Oral, resp. rate 16, height 5\' 2"  (1.575 m), weight 175 lb 3.2 oz (79.47 kg), SpO2 99 %.  LABORATORY DATA: Lab Results  Component Value Date   WBC 6.4 05/21/2014   HGB 10.9* 05/21/2014   HCT 33.1*  05/21/2014   MCV 104.0* 05/21/2014   PLT 292 05/21/2014      Chemistry      Component Value Date/Time   NA 140 05/21/2014 1238   NA 138 12/10/2013 0637   K 4.0 05/21/2014 1238   K 4.7 12/10/2013 0637   CL 99 12/10/2013 0637   CO2 31* 05/21/2014 1238   CO2 28 12/10/2013 0637   BUN 19.1 05/21/2014 1238   BUN 22 12/10/2013 0637   CREATININE 1.0 05/21/2014 1238   CREATININE 0.75 12/10/2013 0637   CREATININE 0.80 11/22/2013 1054      Component Value Date/Time   CALCIUM 9.7 05/21/2014 1238   CALCIUM 9.8 12/10/2013 0637   ALKPHOS 83 05/21/2014 1238   ALKPHOS 77 12/10/2013 0637   AST 28 05/21/2014 1238   AST 16 12/10/2013 0637   ALT 31 05/21/2014 1238   ALT 16 12/10/2013 0637   BILITOT 0.32 05/21/2014 1238   BILITOT 0.4 12/10/2013 0637        RADIOGRAPHIC STUDIES:  ASSESSMENT AND PLAN: This is a very pleasant 69 years old white female recently diagnosed with:  1) Stage IV non-small cell lung cancer, squamous cell carcinoma currently undergoing systemic chemotherapy with carboplatin and Abraxane status post 6 cycles. Her recent CT scan revealed evidence for disease progression. She is currently being treated on the BMS 370 research protocol group B, arm a with Nivolumab given every 2 weeks, status post 1 cycle.  Patient was discussed with and also seen by Dr. [name]. She'll proceed with cycle #2 today as scheduled.   2) chemotherapy-induced anemia: Improved. We will continue to monitor her closely and consider her for PRBCs transfusion if hemoglobin less than 8.0 g/dL.  She'll follow-up in 2 weeks prior to cycle #3. We will reschedule the CT scan she had for 06/17/2014 to either 06/30/2014 or 07/01/2014 per protocol.   The patient was advised to call immediately if she has any concerning symptoms in the interval.  The patient voices understanding of current disease status and treatment options and is in agreement with the current care plan.  All questions were answered. The patient knows to call the clinic with any problems, questions or concerns. We can certainly see the patient much sooner if necessary.  Carlton Adam, PA-C 05/21/2014  ADDENDUM: Hematology/Oncology Attending: I had a face to face encounter with the patient. I recommended her care plan. This is a very pleasant 69 years old white female with metastatic non-small cell lung cancer, squamous cell carcinoma completed 6 cycles of induction chemotherapy with carboplatin and Abraxane with partial response. The patient is currently enrolled on the BMS clinical trial, checkmate 370 and she was randomized to treatment with Nivolumab. She status post 1 cycle and tolerated the first cycle of her treatment fairly well with no significant adverse effects. I  recommended for the patient to proceed with cycle #2 today as scheduled. She would come back for follow-up visit in 2 weeks for reevaluation before starting cycle #3. The patient was advised to call immediately if she has any concerning symptoms in the interval.  Disclaimer: This note was dictated with voice recognition software. Similar sounding words can inadvertently be transcribed and may not be corrected upon review. Eilleen Kempf., MD 05/25/2014

## 2014-05-21 NOTE — Progress Notes (Signed)
BMS [id number]; Group B: Arm A: Cycle 2, Day 1 Patient here for Cycle 2 Day 1 of Nivolumab. Patient completed FACT-L and EQ-5D questionnaire with me prior to appointment with PA, Awilda Metro. Resting vital signs with O2 collected. Physical measurements, targeted physical exam, ECOG performance status, adverse events assessment, protocol required labs, and review of concomitant medications completed. Patient denies any adverse events or new complaints, baseline reported fatigue and neuropathy continues and patient states has not worsened. Labs reviewed with PA and per Awilda Metro, PA., patient ok to proceed with treatment today. All patient's questions answered to her satisfaction and patient denies any questions at this time, encouraged her to call clinic with any questions or concerns. Study assigned Nivolumab vial numbers information sheet given to [facility], pharmacist and treatment infusion sign given to Royce Macadamia, RN.  [name], RN, Clinical Research 05/21/2014 3:04 PM

## 2014-05-22 ENCOUNTER — Other Ambulatory Visit: Payer: Self-pay | Admitting: Medical Oncology

## 2014-05-22 ENCOUNTER — Telehealth: Payer: Self-pay | Admitting: Internal Medicine

## 2014-05-22 DIAGNOSIS — C3492 Malignant neoplasm of unspecified part of left bronchus or lung: Secondary | ICD-10-CM

## 2014-05-22 NOTE — Telephone Encounter (Signed)
Called to confirm appointment for May. informed she can recieved calendar at next appointment in April.

## 2014-05-23 NOTE — Patient Instructions (Signed)
Follow-up in 2 weeks

## 2014-05-24 ENCOUNTER — Other Ambulatory Visit: Payer: Self-pay | Admitting: Internal Medicine

## 2014-06-03 ENCOUNTER — Other Ambulatory Visit: Payer: Self-pay | Admitting: Internal Medicine

## 2014-06-03 DIAGNOSIS — C3490 Malignant neoplasm of unspecified part of unspecified bronchus or lung: Secondary | ICD-10-CM

## 2014-06-04 ENCOUNTER — Encounter: Payer: Self-pay | Admitting: Physician Assistant

## 2014-06-04 ENCOUNTER — Ambulatory Visit (HOSPITAL_BASED_OUTPATIENT_CLINIC_OR_DEPARTMENT_OTHER): Payer: Medicare Other | Admitting: Physician Assistant

## 2014-06-04 ENCOUNTER — Other Ambulatory Visit: Payer: Self-pay | Admitting: Medical Oncology

## 2014-06-04 ENCOUNTER — Telehealth: Payer: Self-pay | Admitting: Internal Medicine

## 2014-06-04 ENCOUNTER — Other Ambulatory Visit (HOSPITAL_BASED_OUTPATIENT_CLINIC_OR_DEPARTMENT_OTHER): Payer: Medicare Other | Admitting: *Deleted

## 2014-06-04 ENCOUNTER — Ambulatory Visit (HOSPITAL_BASED_OUTPATIENT_CLINIC_OR_DEPARTMENT_OTHER): Payer: Medicare Other

## 2014-06-04 ENCOUNTER — Other Ambulatory Visit (HOSPITAL_BASED_OUTPATIENT_CLINIC_OR_DEPARTMENT_OTHER): Payer: Medicare Other

## 2014-06-04 ENCOUNTER — Encounter: Payer: Self-pay | Admitting: Medical Oncology

## 2014-06-04 ENCOUNTER — Other Ambulatory Visit (HOSPITAL_COMMUNITY)
Admission: RE | Admit: 2014-06-04 | Discharge: 2014-06-04 | Disposition: A | Discharge status: Home / Self Care | Payer: Medicare Other | Source: Ambulatory Visit | Attending: Internal Medicine | Admitting: Internal Medicine

## 2014-06-04 ENCOUNTER — Other Ambulatory Visit: Payer: Self-pay | Admitting: Hematology and Oncology

## 2014-06-04 VITALS — BP 110/67 | HR 90 | Temp 98.1°F | Resp 18 | Ht 62.0 in | Wt 170.8 lb

## 2014-06-04 DIAGNOSIS — Z006 Encounter for examination for normal comparison and control in clinical research program: Secondary | ICD-10-CM

## 2014-06-04 DIAGNOSIS — C3490 Malignant neoplasm of unspecified part of unspecified bronchus or lung: Secondary | ICD-10-CM | POA: Diagnosis present

## 2014-06-04 DIAGNOSIS — C3492 Malignant neoplasm of unspecified part of left bronchus or lung: Secondary | ICD-10-CM

## 2014-06-04 DIAGNOSIS — Z5112 Encounter for antineoplastic immunotherapy: Secondary | ICD-10-CM

## 2014-06-04 DIAGNOSIS — Z87891 Personal history of nicotine dependence: Secondary | ICD-10-CM | POA: Diagnosis not present

## 2014-06-04 LAB — CBC WITH DIFFERENTIAL/PLATELET
BASO%: 0.8 % (ref 0.0–2.0)
Basophils Absolute: 0.1 10*3/uL (ref 0.0–0.1)
EOS ABS: 0.1 10*3/uL (ref 0.0–0.5)
EOS%: 1.2 % (ref 0.0–7.0)
HCT: 34.1 % — ABNORMAL LOW (ref 34.8–46.6)
HGB: 11.1 g/dL — ABNORMAL LOW (ref 11.6–15.9)
LYMPH#: 1.1 10*3/uL (ref 0.9–3.3)
LYMPH%: 14.3 % (ref 14.0–49.7)
MCH: 33.3 pg (ref 25.1–34.0)
MCHC: 32.7 g/dL (ref 31.5–36.0)
MCV: 102 fL — ABNORMAL HIGH (ref 79.5–101.0)
MONO#: 0.7 10*3/uL (ref 0.1–0.9)
MONO%: 8.6 % (ref 0.0–14.0)
NEUT%: 75.1 % (ref 38.4–76.8)
NEUTROS ABS: 5.7 10*3/uL (ref 1.5–6.5)
Platelets: 266 10*3/uL (ref 145–400)
RBC: 3.34 10*6/uL — ABNORMAL LOW (ref 3.70–5.45)
RDW: 18.3 % — AB (ref 11.2–14.5)
WBC: 7.6 10*3/uL (ref 3.9–10.3)

## 2014-06-04 LAB — COMPREHENSIVE METABOLIC PANEL (CC13)
ALT: 16 U/L (ref 0–55)
AST: 18 U/L (ref 5–34)
Albumin: 3.7 g/dL (ref 3.5–5.0)
Alkaline Phosphatase: 80 U/L (ref 40–150)
Anion Gap: 11 mEq/L (ref 3–11)
BUN: 22.8 mg/dL (ref 7.0–26.0)
CALCIUM: 9.5 mg/dL (ref 8.4–10.4)
CHLORIDE: 101 meq/L (ref 98–109)
CO2: 27 mEq/L (ref 22–29)
Creatinine: 1 mg/dL (ref 0.6–1.1)
EGFR: 60 mL/min/{1.73_m2} — AB (ref >90)
GLUCOSE: 105 mg/dL (ref 70–140)
POTASSIUM: 4.6 meq/L (ref 3.5–5.1)
Sodium: 140 mEq/L (ref 136–145)
Total Bilirubin: 0.35 mg/dL (ref 0.20–1.20)
Total Protein: 6.6 g/dL (ref 6.4–8.3)

## 2014-06-04 LAB — PHOSPHORUS: PHOSPHORUS: 2.9 mg/dL (ref 2.3–4.6)

## 2014-06-04 LAB — MAGNESIUM (CC13): MAGNESIUM: 2 mg/dL (ref 1.5–2.5)

## 2014-06-04 LAB — LACTATE DEHYDROGENASE (CC13): LDH: 196 U/L (ref 125–245)

## 2014-06-04 MED ORDER — SODIUM CHLORIDE 0.9 % IJ SOLN
10.0000 mL | INTRAMUSCULAR | Status: DC | PRN
  Administered 2014-06-04: 10 mL
  Filled 2014-06-04: qty 10

## 2014-06-04 MED ORDER — SODIUM CHLORIDE 0.9 % IV SOLN
Freq: Once | INTRAVENOUS | Status: AC
  Administered 2014-06-04: 14:00:00 via INTRAVENOUS

## 2014-06-04 MED ORDER — MAGNESIUM OXIDE 400 (241.3 MG) MG PO TABS: 400.0000 mg | ORAL_TABLET | Freq: Two times a day (BID) | ORAL | Status: DC

## 2014-06-04 MED ORDER — INV-NIVOLUMAB CHEMO INJECTION 100 MG/10 ML BMS CA 209-370
240.0000 mg | Freq: Once | INTRAVENOUS | Status: AC
  Administered 2014-06-04: 240 mg via INTRAVENOUS
  Filled 2014-06-04: qty 24

## 2014-06-04 MED ORDER — GABAPENTIN 100 MG PO CAPS: 100.0000 mg | ORAL_CAPSULE | Freq: Three times a day (TID) | ORAL | Status: DC

## 2014-06-04 MED ORDER — HEPARIN SOD (PORK) LOCK FLUSH 100 UNIT/ML IV SOLN
500.0000 [IU] | Freq: Once | INTRAVENOUS | Status: AC | PRN
  Administered 2014-06-04: 500 [IU]
  Filled 2014-06-04: qty 5

## 2014-06-04 NOTE — Patient Instructions (Signed)
[facility] Discharge Instructions for Patients Receiving Chemotherapy  Today you received the following chemotherapy agents nivolumab   To help prevent nausea and vomiting after your treatment, we encourage you to take your nausea medication as directed   If you develop nausea and vomiting that is not controlled by your nausea medication, call the clinic.   BELOW ARE SYMPTOMS THAT SHOULD BE REPORTED IMMEDIATELY:  *FEVER GREATER THAN 100.5 F  *CHILLS WITH OR WITHOUT FEVER  NAUSEA AND VOMITING THAT IS NOT CONTROLLED WITH YOUR NAUSEA MEDICATION  *UNUSUAL SHORTNESS OF BREATH  *UNUSUAL BRUISING OR BLEEDING  TENDERNESS IN MOUTH AND THROAT WITH OR WITHOUT PRESENCE OF ULCERS  *URINARY PROBLEMS  *BOWEL PROBLEMS  UNUSUAL RASH Items with * indicate a potential emergency and should be followed up as soon as possible.  Feel free to call the clinic you have any questions or concerns. The clinic phone number is [phone].  

## 2014-06-04 NOTE — Progress Notes (Signed)
BMS [id number]; Group B: Arm A: Cycle 3, Day 1 Patient here for Cycle 3 Day 1 of Nivolumab. Patient completed FACT-L and EQ-5D questionnaire with me prior to lab drawn and appointment with PA, Awilda Metro. Resting vital signs with O2 collected. Physical measurements, targeted physical exam, ECOG performance status, adverse events assessment, protocol required labs, and review of concomitant medications completed. Patient denies any adverse events or new complaints, baseline reported neuropathy continues and patient states has not worsened but reports neuropathy in feet as "annoying", per Dr. [name] patient to have a new prescription for Neurontin 100 mg for patient to take 3 times per day. Patient also reports improvement in energy level. Labs reviewed with PA and per Awilda Metro, PA., patient ok to proceed with treatment today. Per Dr. [name], patient to decrease magnesium from 3 times daily to two times daily. Patient verbalized understanding.  All patient's questions answered to her satisfaction and patient denies any questions at this time, encouraged her to call clinic with any questions or concerns. Study assigned Nivolumab vial numbers information sheet given to pharmacist, Jaclyn Shaggy  and treatment infusion sign given to Margaretmary Eddy, Therapist, sports. Patient to return in 2 weeks for Cycle 4.  [name], RN, Clinical Research 05/21/2014 3:04 PM

## 2014-06-04 NOTE — Progress Notes (Addendum)
[place] Telephone:(336) [phone]   Fax:(336) [place] [address] [place] [address]  DIAGNOSIS: Stage IV (T2, N2, M1a) non-small cell lung cancer, squamous cell carcinoma presented with central left upper lobe lung mass with associated postobstructive atelectasis as well as ipsilateral and subcarinal lymphadenopathy as well as pleural tumor spread diagnosed in October 2015.  PRIOR THERAPY: Systemic chemotherapy with carboplatin for AUC of 5 on day 1 and Abraxane 100 MG/M2 on days 1, 8 and 15 every 3 weeks. Status post 6 cycles.   CURRENT THERAPY: Nivolumab  per research protocol BMS 370 group E, arm A, given every 2 weeks. Status post 2 cycles  INTERVAL HISTORY: Tammie Clay 69 y.o. female returns to the clinic today for follow-up visit accompanied by her significant other. She tolerated her first cycle of  Nivolumab without difficulty. Specifically she had no increased shortness of breath, skin rash or diarrhea. She presents today for cycle #3.  She denied having any significant fever or chills. The patient denied having any significant chest pain, shortness of breath, cough or hemoptysis. She has no nausea or vomiting. She denied having any significant weight loss or night sweats. She continues to have some fatigue. She also continues to complain of some neuropathy symptoms that were a result of the previous chemotherapy regimen.  MEDICAL HISTORY: Past Medical History  Diagnosis Date  . Hypertension   . COPD (chronic obstructive pulmonary disease)   . Hyperlipidemia   . GERD (gastroesophageal reflux disease)   . Osteopenia   . Insomnia   . Mediastinal lymphadenopathy 11/19/13    PER CT  . Multiple lung nodules on CT 11/19/13    LEFT LUNG BASE  . Lung mass 11/19/13    RUL PER CT  . Coronary artery calcification seen on CAT scan 11/21/2013  . COPD (chronic obstructive pulmonary disease)  11/21/2013  . Pneumonia     hx of  . Anxiety     ALLERGIES:  has No Known Allergies.  MEDICATIONS:  Current Outpatient Prescriptions  Medication Sig Dispense Refill  . acetaminophen (TYLENOL) 325 MG tablet Take 650 mg by mouth. Take 2 tablets every morning    . albuterol (PROVENTIL HFA;VENTOLIN HFA) 108 (90 BASE) MCG/ACT inhaler Inhale 2 puffs into the lungs every 6 (six) hours as needed for wheezing.    . budesonide-formoterol (SYMBICORT) 160-4.5 MCG/ACT inhaler Inhale 2 puffs into the lungs 2 (two) times daily.    . diazepam (VALIUM) 5 MG tablet Take 5 mg by mouth at bedtime.    . docusate sodium (COLACE) 100 MG capsule Take 100 mg by mouth 2 (two) times daily.    . DULoxetine (CYMBALTA) 30 MG capsule TAKE 1 CAPSULE (30 MG TOTAL) BY MOUTH DAILY. 30 capsule 2  . HYDROcodone-acetaminophen (NORCO) 10-325 MG per tablet Take 1 tablet by mouth every 6 (six) hours as needed.    . lidocaine-prilocaine (EMLA) cream Apply 1 application topically as needed. 30 g 0  . lisinopril-hydrochlorothiazide (PRINZIDE,ZESTORETIC) 20-12.5 MG per tablet Take 2 tablets by mouth every morning.     . loratadine (CLARITIN) 10 MG tablet Take 10 mg by mouth daily.    . magnesium oxide (MAG-OX) 400 (241.3 MG) MG tablet Take 1 tablet (400 mg total) by mouth 2 (two) times daily. 60 tablet 0  . metoprolol succinate (TOPROL-XL) 50 MG 24 hr tablet Take 50 mg by mouth every morning. Take with or immediately  following a meal.    . Multiple Vitamin (MULTIVITAMIN WITH MINERALS) TABS Take 1 tablet by mouth daily.    . polyethylene glycol (MIRALAX / GLYCOLAX) packet Take 17 g by mouth daily as needed.    . prochlorperazine (COMPAZINE) 10 MG tablet Take 1 tablet (10 mg total) by mouth every 6 (six) hours as needed for nausea or vomiting. 30 tablet 1  . ranitidine (ZANTAC) 150 MG tablet Take 150 mg by mouth 2 (two) times daily.    . SYMBICORT 80-4.5 MCG/ACT inhaler   3  . calcium carbonate (TUMS - DOSED IN MG ELEMENTAL CALCIUM)  500 MG chewable tablet Chew 1 tablet by mouth daily.    Marland Kitchen gabapentin (NEURONTIN) 100 MG capsule Take 1 capsule (100 mg total) by mouth 3 (three) times daily. 90 capsule 1   No current facility-administered medications for this visit.    SURGICAL HISTORY:  Past Surgical History  Procedure Laterality Date  .  c section x 2    . Urethral cyst removed    . Tonsillectomy    . Appendectomy    . Colonoscopy with propofol N/A 07/17/2012    Procedure: COLONOSCOPY WITH PROPOFOL;  Surgeon: Garlan Fair, MD;  Location: [facility];  Service: Endoscopy;  Laterality: N/A;  . Esophagogastroduodenoscopy (egd) with propofol N/A 07/17/2012    Procedure: ESOPHAGOGASTRODUODENOSCOPY (EGD) WITH PROPOFOL;  Surgeon: Garlan Fair, MD;  Location: [facility];  Service: Endoscopy;  Laterality: N/A;  . Video bronchoscopy with endobronchial ultrasound N/A 12/03/2013    Procedure: VIDEO BRONCHOSCOPY WITH ENDOBRONCHIAL ULTRASOUND WITH BIOPSIES;  Surgeon: Grace Isaac, MD;  Location: [place];  Service: Thoracic;  Laterality: N/A;  . Portacath placement Left 12/10/2013    Procedure: INSERTION PORT-A-CATH;  Surgeon: Grace Isaac, MD;  Location: [place];  Service: Thoracic;  Laterality: Left;    REVIEW OF SYSTEMS:  Constitutional: positive for fatigue Eyes: negative Ears, nose, mouth, throat, and face: negative Respiratory: negative Cardiovascular: negative Gastrointestinal: negative Genitourinary:negative Integument/breast: negative Hematologic/lymphatic: negative Musculoskeletal:negative Neurological: positive for paresthesia Behavioral/Psych: negative Endocrine: negative Allergic/Immunologic: negative   PHYSICAL EXAMINATION: General appearance: alert, cooperative, fatigued and no distress Head: Normocephalic, without obvious abnormality, atraumatic Neck: no adenopathy, no JVD, supple, symmetrical, trachea midline and thyroid not enlarged, symmetric, no tenderness/mass/nodules Lymph nodes:  Cervical, supraclavicular, and axillary nodes normal. Resp: clear to auscultation bilaterally Back: symmetric, no curvature. ROM normal. No CVA tenderness. Cardio: regular rate and rhythm, S1, S2 normal, no murmur, click, rub or gallop GI: soft, non-tender; bowel sounds normal; no masses,  no organomegaly Genitalia: defer exam Extremities: extremities normal, atraumatic, no cyanosis or edema Neurologic: Alert and oriented X 3, normal strength and tone. Normal symmetric reflexes. Normal coordination and gait  ECOG PERFORMANCE STATUS: 1 - Symptomatic but completely ambulatory  Blood pressure 110/67, pulse 90, temperature 98.1 F (36.7 C), temperature source Oral, resp. rate 18, height '5\' 2"'$  (1.575 m), weight 170 lb 12.8 oz (77.474 kg), SpO2 100 %.  LABORATORY DATA: Lab Results  Component Value Date   WBC 7.6 06/04/2014   HGB 11.1* 06/04/2014   HCT 34.1* 06/04/2014   MCV 102.0* 06/04/2014   PLT 266 06/04/2014      Chemistry      Component Value Date/Time   NA 140 06/04/2014 1311   NA 138 12/10/2013 0637   K 4.6 06/04/2014 1311   K 4.7 12/10/2013 0637   CL 99 12/10/2013 0637   CO2 27 06/04/2014 1311   CO2 28 12/10/2013 6160  BUN 22.8 06/04/2014 1311   BUN 22 12/10/2013 0637   CREATININE 1.0 06/04/2014 1311   CREATININE 0.75 12/10/2013 0637   CREATININE 0.80 11/22/2013 1054      Component Value Date/Time   CALCIUM 9.5 06/04/2014 1311   CALCIUM 9.8 12/10/2013 0637   ALKPHOS 80 06/04/2014 1311   ALKPHOS 77 12/10/2013 0637   AST 18 06/04/2014 1311   AST 16 12/10/2013 0637   ALT 16 06/04/2014 1311   ALT 16 12/10/2013 0637   BILITOT 0.35 06/04/2014 1311   BILITOT 0.4 12/10/2013 7829       RADIOGRAPHIC STUDIES:  ASSESSMENT AND PLAN: This is a very pleasant 69 years old white female recently diagnosed with:  1) Stage IV non-small cell lung cancer, squamous cell carcinoma currently undergoing systemic chemotherapy with carboplatin and Abraxane status post 6 cycles. Her  recent CT scan revealed evidence for disease progression. She is currently being treated on the BMS 370 research protocol group B, arm a with Nivolumab given every 2 weeks, status post 2 cycles.  Patient was discussed with and also seen by Dr. [name]. She'll proceed with cycle #3 today as scheduled. For the peripheral neuropathy symptoms, patient will be started on gabapentin 100 mg by mouth 3 times daily prescription for this medication was sent her pharmacy of record via E scribed. Additionally we will decrease her magnesium oxide from 400 mg 3 times a day to 400 mg twice a day.  2) chemotherapy-induced anemia: Improved. We will continue to monitor her closely and consider her for PRBCs transfusion if hemoglobin less than 8.0 g/dL.  She'll follow-up in 2 weeks prior to cycle #4. Her restaging CT scan she has been scheduled for 06/30/2014 per protocol.   The patient was advised to call immediately if she has any concerning symptoms in the interval.  The patient voices understanding of current disease status and treatment options and is in agreement with the current care plan.  All questions were answered. The patient knows to call the clinic with any problems, questions or concerns. We can certainly see the patient much sooner if necessary.  Carlton Adam, PA-C 06/04/2014  ADDENDUM:  Hematology/Oncology Attending:  I had a face to face encounter with the patient. I recommended his care plan. This is a very pleasant 69 years old white female with metastatic non-small cell lung cancer, squamous cell carcinoma. She status post induction chemotherapy with carboplatin and Abraxane. She is currently undergoing maintenance treatment on Nivolumab according to the BMS clinical trial checkmate 370. She status post 2 cycles and tolerating the treatment fairly well with no significant adverse effects. I recommended for the patient to proceed with cycle #3 today as a scheduled. She will come back for  follow-up visit in 2 weeks for reevaluation before starting cycle #4. The patient was advised to call immediately if she has any concerning symptoms in the interval.  Disclaimer: This note was dictated with voice recognition software. Similar sounding words can inadvertently be transcribed and may be missed upon review. Eilleen Kempf., MD 06/07/2014

## 2014-06-04 NOTE — Telephone Encounter (Signed)
Gave avs & calendar for May. °

## 2014-06-05 ENCOUNTER — Other Ambulatory Visit: Payer: Self-pay | Admitting: Medical Oncology

## 2014-06-06 NOTE — Patient Instructions (Signed)
Start taking gabapentin 100 mg by mouth 3 times daily for your peripheral neuropathy symptoms Decrease your magnesium oxide from 400 mg by mouth 3 times a day to 400 mg by mouth twice a day Follow-up in 2 weeks prior to your next scheduled cycle of immunotherapy

## 2014-06-09 ENCOUNTER — Other Ambulatory Visit: Payer: Self-pay | Admitting: Medical Oncology

## 2014-06-17 ENCOUNTER — Other Ambulatory Visit: Payer: Self-pay | Admitting: Medical Oncology

## 2014-06-17 ENCOUNTER — Other Ambulatory Visit: Payer: Self-pay | Admitting: *Deleted

## 2014-06-17 ENCOUNTER — Telehealth: Payer: Self-pay | Admitting: Internal Medicine

## 2014-06-17 ENCOUNTER — Other Ambulatory Visit: Payer: Medicare Other

## 2014-06-17 DIAGNOSIS — C3492 Malignant neoplasm of unspecified part of left bronchus or lung: Secondary | ICD-10-CM

## 2014-06-17 NOTE — Telephone Encounter (Signed)
lvm for pt regarding to 5.23 lab and ct cx and moved to 5.26 er Scientist, research (life sciences)

## 2014-06-18 ENCOUNTER — Ambulatory Visit (HOSPITAL_BASED_OUTPATIENT_CLINIC_OR_DEPARTMENT_OTHER): Payer: Medicare Other | Admitting: Physician Assistant

## 2014-06-18 ENCOUNTER — Encounter: Payer: Self-pay | Admitting: Medical Oncology

## 2014-06-18 ENCOUNTER — Ambulatory Visit (HOSPITAL_BASED_OUTPATIENT_CLINIC_OR_DEPARTMENT_OTHER): Payer: Medicare Other

## 2014-06-18 ENCOUNTER — Other Ambulatory Visit: Payer: Self-pay | Admitting: Medical Oncology

## 2014-06-18 ENCOUNTER — Telehealth: Payer: Self-pay | Admitting: Internal Medicine

## 2014-06-18 ENCOUNTER — Other Ambulatory Visit (HOSPITAL_COMMUNITY)
Admission: RE | Admit: 2014-06-18 | Discharge: 2014-06-18 | Disposition: A | Discharge status: Home / Self Care | Payer: Medicare Other | Source: Ambulatory Visit | Attending: Internal Medicine | Admitting: Internal Medicine

## 2014-06-18 ENCOUNTER — Encounter: Payer: Self-pay | Admitting: Oncology

## 2014-06-18 ENCOUNTER — Encounter: Payer: Self-pay | Admitting: Physician Assistant

## 2014-06-18 ENCOUNTER — Other Ambulatory Visit (HOSPITAL_BASED_OUTPATIENT_CLINIC_OR_DEPARTMENT_OTHER): Payer: Medicare Other

## 2014-06-18 VITALS — BP 122/67 | HR 94 | Temp 98.3°F | Resp 19 | Ht 62.0 in | Wt 169.5 lb

## 2014-06-18 DIAGNOSIS — Z79899 Other long term (current) drug therapy: Secondary | ICD-10-CM | POA: Diagnosis not present

## 2014-06-18 DIAGNOSIS — G629 Polyneuropathy, unspecified: Secondary | ICD-10-CM

## 2014-06-18 DIAGNOSIS — Z87891 Personal history of nicotine dependence: Secondary | ICD-10-CM | POA: Insufficient documentation

## 2014-06-18 DIAGNOSIS — C3492 Malignant neoplasm of unspecified part of left bronchus or lung: Secondary | ICD-10-CM | POA: Insufficient documentation

## 2014-06-18 DIAGNOSIS — Z5112 Encounter for antineoplastic immunotherapy: Secondary | ICD-10-CM | POA: Diagnosis not present

## 2014-06-18 DIAGNOSIS — Z006 Encounter for examination for normal comparison and control in clinical research program: Secondary | ICD-10-CM

## 2014-06-18 DIAGNOSIS — C3412 Malignant neoplasm of upper lobe, left bronchus or lung: Secondary | ICD-10-CM

## 2014-06-18 LAB — CBC WITH DIFFERENTIAL/PLATELET
BASO%: 0.8 % (ref 0.0–2.0)
Basophils Absolute: 0.1 10*3/uL (ref 0.0–0.1)
EOS ABS: 0.2 10*3/uL (ref 0.0–0.5)
EOS%: 2.3 % (ref 0.0–7.0)
HEMATOCRIT: 36 % (ref 34.8–46.6)
HGB: 12 g/dL (ref 11.6–15.9)
LYMPH#: 1.2 10*3/uL (ref 0.9–3.3)
LYMPH%: 16 % (ref 14.0–49.7)
MCH: 33.2 pg (ref 25.1–34.0)
MCHC: 33.3 g/dL (ref 31.5–36.0)
MCV: 99.7 fL (ref 79.5–101.0)
MONO#: 0.6 10*3/uL (ref 0.1–0.9)
MONO%: 7.2 % (ref 0.0–14.0)
NEUT#: 5.7 10*3/uL (ref 1.5–6.5)
NEUT%: 73.7 % (ref 38.4–76.8)
PLATELETS: 254 10*3/uL (ref 145–400)
RBC: 3.61 10*6/uL — ABNORMAL LOW (ref 3.70–5.45)
RDW: 17.2 % — ABNORMAL HIGH (ref 11.2–14.5)
WBC: 7.7 10*3/uL (ref 3.9–10.3)

## 2014-06-18 LAB — COMPREHENSIVE METABOLIC PANEL (CC13)
ALBUMIN: 3.9 g/dL (ref 3.5–5.0)
ALK PHOS: 81 U/L (ref 40–150)
ALT: 16 U/L (ref 0–55)
AST: 18 U/L (ref 5–34)
Anion Gap: 11 mEq/L (ref 3–11)
BILIRUBIN TOTAL: 0.41 mg/dL (ref 0.20–1.20)
BUN: 20.5 mg/dL (ref 7.0–26.0)
CO2: 29 mEq/L (ref 22–29)
Calcium: 9.8 mg/dL (ref 8.4–10.4)
Chloride: 100 mEq/L (ref 98–109)
Creatinine: 1 mg/dL (ref 0.6–1.1)
EGFR: 56 mL/min/{1.73_m2} — ABNORMAL LOW (ref >90)
Glucose: 105 mg/dl (ref 70–140)
Potassium: 3.7 mEq/L (ref 3.5–5.1)
Sodium: 141 mEq/L (ref 136–145)
Total Protein: 7.2 g/dL (ref 6.4–8.3)

## 2014-06-18 LAB — RESEARCH LABS

## 2014-06-18 LAB — TSH CHCC: TSH: 0.715 m(IU)/L (ref 0.308–3.960)

## 2014-06-18 LAB — LACTATE DEHYDROGENASE (CC13): LDH: 205 U/L (ref 125–245)

## 2014-06-18 LAB — PHOSPHORUS: PHOSPHORUS: 2.9 mg/dL (ref 2.5–4.6)

## 2014-06-18 LAB — MAGNESIUM (CC13): Magnesium: 1.8 mg/dl (ref 1.5–2.5)

## 2014-06-18 MED ORDER — SODIUM CHLORIDE 0.9 % IV SOLN
Freq: Once | INTRAVENOUS | Status: AC
  Administered 2014-06-18: 13:00:00 via INTRAVENOUS

## 2014-06-18 MED ORDER — SODIUM CHLORIDE 0.9 % IV SOLN
240.0000 mg | Freq: Once | INTRAVENOUS | Status: AC
  Administered 2014-06-18: 240 mg via INTRAVENOUS
  Filled 2014-06-18: qty 24

## 2014-06-18 MED ORDER — HEPARIN SOD (PORK) LOCK FLUSH 100 UNIT/ML IV SOLN
500.0000 [IU] | Freq: Once | INTRAVENOUS | Status: AC | PRN
  Administered 2014-06-18: 500 [IU]
  Filled 2014-06-18: qty 5

## 2014-06-18 MED ORDER — SODIUM CHLORIDE 0.9 % IJ SOLN
10.0000 mL | INTRAMUSCULAR | Status: DC | PRN
  Administered 2014-06-18: 10 mL
  Filled 2014-06-18: qty 10

## 2014-06-18 NOTE — Progress Notes (Addendum)
[place] Telephone:(336) [phone]   Fax:(336) [place] [address] [place] [address]  DIAGNOSIS: Stage IV (T2, N2, M1a) non-small cell lung cancer, squamous cell carcinoma presented with central left upper lobe lung mass with associated postobstructive atelectasis as well as ipsilateral and subcarinal lymphadenopathy as well as pleural tumor spread diagnosed in October 2015.  PRIOR THERAPY: Systemic chemotherapy with carboplatin for AUC of 5 on day 1 and Abraxane 100 MG/M2 on days 1, 8 and 15 every 3 weeks. Status post 6 cycles.   CURRENT THERAPY: Nivolumab  per research protocol BMS 370 group B, arm A, given every 2 weeks. Status post 3 cycles  INTERVAL HISTORY: Tammie Clay 69 y.o. female returns to the clinic today for follow-up visit accompanied by her significant other. She is tolerating her treatment with Nivolumab without difficulty. Specifically she had no increased shortness of breath, skin rash or diarrhea. She actually reports that she feels good and feels that her breathing has improved. She presents today for cycle #4.  She denied having any significant fever or chills. The patient denied having any significant chest pain, shortness of breath, cough or hemoptysis. She has no nausea or vomiting. She denied having any significant weight loss or night sweats. Her energy level has improved.  She also continues to complain of some neuropathy symptoms that were a result of the previous chemotherapy regimen. She is currently taking gabapentin 100 mg by mouth 3 times daily with minimal change in her symptoms.  MEDICAL HISTORY: Past Medical History  Diagnosis Date  . Hypertension   . COPD (chronic obstructive pulmonary disease)   . Hyperlipidemia   . GERD (gastroesophageal reflux disease)   . Osteopenia   . Insomnia   . Mediastinal lymphadenopathy 11/19/13    PER CT  . Multiple lung nodules on CT  11/19/13    LEFT LUNG BASE  . Lung mass 11/19/13    RUL PER CT  . Coronary artery calcification seen on CAT scan 11/21/2013  . COPD (chronic obstructive pulmonary disease) 11/21/2013  . Pneumonia     hx of  . Anxiety     ALLERGIES:  has No Known Allergies.  MEDICATIONS:  Current Outpatient Prescriptions  Medication Sig Dispense Refill  . acetaminophen (TYLENOL) 325 MG tablet Take 650 mg by mouth. Take 2 tablets every morning    . albuterol (PROVENTIL HFA;VENTOLIN HFA) 108 (90 BASE) MCG/ACT inhaler Inhale 2 puffs into the lungs every 6 (six) hours as needed for wheezing.    . calcium carbonate (TUMS - DOSED IN MG ELEMENTAL CALCIUM) 500 MG chewable tablet Chew 1 tablet by mouth daily.    . diazepam (VALIUM) 5 MG tablet Take 5 mg by mouth at bedtime.    . docusate sodium (COLACE) 100 MG capsule Take 100 mg by mouth 2 (two) times daily.    . DULoxetine (CYMBALTA) 30 MG capsule TAKE 1 CAPSULE (30 MG TOTAL) BY MOUTH DAILY. 30 capsule 2  . gabapentin (NEURONTIN) 100 MG capsule Take 1 capsule (100 mg total) by mouth 3 (three) times daily. 90 capsule 1  . HYDROcodone-acetaminophen (NORCO) 10-325 MG per tablet Take 1 tablet by mouth every 6 (six) hours as needed.    . lidocaine-prilocaine (EMLA) cream Apply 1 application topically as needed. 30 g 0  . lisinopril-hydrochlorothiazide (PRINZIDE,ZESTORETIC) 20-12.5 MG per tablet Take 2 tablets by mouth every morning.     . loratadine (CLARITIN)  10 MG tablet Take 10 mg by mouth daily.    . magnesium oxide (MAG-OX) 400 (241.3 MG) MG tablet Take 1 tablet (400 mg total) by mouth 2 (two) times daily. 60 tablet 0  . metoprolol succinate (TOPROL-XL) 50 MG 24 hr tablet Take 50 mg by mouth every morning. Take with or immediately following a meal.    . Multiple Vitamin (MULTIVITAMIN WITH MINERALS) TABS Take 1 tablet by mouth daily.    . polyethylene glycol (MIRALAX / GLYCOLAX) packet Take 17 g by mouth daily as needed.    . prochlorperazine (COMPAZINE) 10 MG  tablet Take 1 tablet (10 mg total) by mouth every 6 (six) hours as needed for nausea or vomiting. 30 tablet 1  . ranitidine (ZANTAC) 150 MG tablet Take 150 mg by mouth 2 (two) times daily.    . SYMBICORT 80-4.5 MCG/ACT inhaler   3   No current facility-administered medications for this visit.   Facility-Administered Medications Ordered in Other Visits  Medication Dose Route Frequency Provider Last Rate Last Dose  . sodium chloride 0.9 % injection 10 mL  10 mL Intracatheter PRN Curt Bears, MD   10 mL at 06/18/14 1420    SURGICAL HISTORY:  Past Surgical History  Procedure Laterality Date  .  c section x 2    . Urethral cyst removed    . Tonsillectomy    . Appendectomy    . Colonoscopy with propofol N/A 07/17/2012    Procedure: COLONOSCOPY WITH PROPOFOL;  Surgeon: Garlan Fair, MD;  Location: [facility];  Service: Endoscopy;  Laterality: N/A;  . Esophagogastroduodenoscopy (egd) with propofol N/A 07/17/2012    Procedure: ESOPHAGOGASTRODUODENOSCOPY (EGD) WITH PROPOFOL;  Surgeon: Garlan Fair, MD;  Location: [facility];  Service: Endoscopy;  Laterality: N/A;  . Video bronchoscopy with endobronchial ultrasound N/A 12/03/2013    Procedure: VIDEO BRONCHOSCOPY WITH ENDOBRONCHIAL ULTRASOUND WITH BIOPSIES;  Surgeon: Grace Isaac, MD;  Location: [facility];  Service: Thoracic;  Laterality: N/A;  . Portacath placement Left 12/10/2013    Procedure: INSERTION PORT-A-CATH;  Surgeon: Grace Isaac, MD;  Location: [facility];  Service: Thoracic;  Laterality: Left;    REVIEW OF SYSTEMS:  Constitutional: positive for fatigue Eyes: negative Ears, nose, mouth, throat, and face: negative Respiratory: negative Cardiovascular: negative Gastrointestinal: negative Genitourinary:negative Integument/breast: negative Hematologic/lymphatic: negative Musculoskeletal:negative Neurological: positive for paresthesia Behavioral/Psych: negative Endocrine: negative Allergic/Immunologic: negative    PHYSICAL EXAMINATION: General appearance: alert, cooperative, fatigued and no distress Head: Normocephalic, without obvious abnormality, atraumatic Neck: no adenopathy, no JVD, supple, symmetrical, trachea midline and thyroid not enlarged, symmetric, no tenderness/mass/nodules Lymph nodes: Cervical, supraclavicular, and axillary nodes normal. Resp: clear to auscultation bilaterally Back: symmetric, no curvature. ROM normal. No CVA tenderness. Cardio: regular rate and rhythm, S1, S2 normal, no murmur, click, rub or gallop GI: soft, non-tender; bowel sounds normal; no masses,  no organomegaly Genitalia: defer exam Extremities: extremities normal, atraumatic, no cyanosis or edema Neurologic: Alert and oriented X 3, normal strength and tone. Normal symmetric reflexes. Normal coordination and gait  ECOG PERFORMANCE STATUS: 1 - Symptomatic but completely ambulatory  Blood pressure 122/67, pulse 94, temperature 98.3 F (36.8 C), temperature source Oral, resp. rate 19, height '5\' 2"'$  (1.575 m), weight 169 lb 8 oz (76.885 kg), SpO2 99 %.  LABORATORY DATA: Lab Results  Component Value Date   WBC 7.7 06/18/2014   HGB 12.0 06/18/2014   HCT 36.0 06/18/2014   MCV 99.7 06/18/2014   PLT 254 06/18/2014  Chemistry      Component Value Date/Time   NA 141 06/18/2014 1103   NA 138 12/10/2013 0637   K 3.7 06/18/2014 1103   K 4.7 12/10/2013 0637   CL 99 12/10/2013 0637   CO2 29 06/18/2014 1103   CO2 28 12/10/2013 0637   BUN 20.5 06/18/2014 1103   BUN 22 12/10/2013 0637   CREATININE 1.0 06/18/2014 1103   CREATININE 0.75 12/10/2013 0637   CREATININE 0.80 11/22/2013 1054      Component Value Date/Time   CALCIUM 9.8 06/18/2014 1103   CALCIUM 9.8 12/10/2013 0637   ALKPHOS 81 06/18/2014 1103   ALKPHOS 77 12/10/2013 0637   AST 18 06/18/2014 1103   AST 16 12/10/2013 0637   ALT 16 06/18/2014 1103   ALT 16 12/10/2013 0637   BILITOT 0.41 06/18/2014 1103   BILITOT 0.4 12/10/2013 0637        RADIOGRAPHIC STUDIES:  ASSESSMENT AND PLAN: This is a very pleasant 69 years old white female recently diagnosed with:  1) Stage IV non-small cell lung cancer, squamous cell carcinoma currently undergoing systemic chemotherapy with carboplatin and Abraxane status post 6 cycles. Her recent CT scan revealed no evidence for disease progression. She is currently being treated on the BMS 370 research protocol group B, arm a with Nivolumab given every 2 weeks, status post 3 cycles.  Patient was discussed with and also seen by Dr. [name]. She'll proceed with cycle #3 today as scheduled. For the peripheral neuropathy symptoms, we will increase her gabapentin to 200 mg by mouth 3 times daily. She will continue magnesium oxide 400 mg by mouth twice daily.   2) chemotherapy-induced anemia: Improved. We will continue to monitor her closely and consider her for PRBCs transfusion if hemoglobin less than 8.0 g/dL.  She'll follow-up in 2 weeks prior to cycle #5. Her restaging CT scan she has been scheduled for 06/30/2014 per protocol.   The patient was advised to call immediately if she has any concerning symptoms in the interval.  The patient voices understanding of current disease status and treatment options and is in agreement with the current care plan.  All questions were answered. The patient knows to call the clinic with any problems, questions or concerns. We can certainly see the patient much sooner if necessary.  Carlton Adam, PA-C 06/18/2014  ADDENDUM: Hematology/Oncology Attending: I had a face to face encounter with the patient. I recommended her care plan. This is a very pleasant 69 years old white female with a stage IV non-small cell lung cancer, squamous cell carcinoma status post induction chemotherapy with carboplatin and Abraxane with partial response. The patient is currently undergoing maintenance treatment with Nivolumab according to the BMS clinical trial checkmate 370.  She is status post 3 cycles of treatment and tolerating the immunotherapy fairly well. I recommended for the patient to proceed with cycle #4 today as scheduled. She will come back for follow-up visit in 2 weeks for reevaluation after repeating CT scan of the chest, abdomen and pelvis for restaging of her disease. The patient was advised to call immediately if she has any concerning symptoms in the interval.  Disclaimer: This note was dictated with voice recognition software. Similar sounding words can inadvertently be transcribed and may be missed upon review. Eilleen Kempf., MD 06/22/2014

## 2014-06-18 NOTE — Progress Notes (Signed)
BMS [id number]; Group B: Arm A: Cycle 4, Day 1 Patient here today for Cycle 4. PRO's completed with research assistant, Remer Macho, prior to lab appointment. I met with patient and Shela Nevin during patient's appointment with PA, Awilda Metro. Resting vital signs with O2 collected. Physical measurements, targeted physical exam, ECOG performance status, adverse events assessment, protocol required labs, research labs, TSH levels and review of concomitant medications completed. Patient denies any adverse events or new complaints. Baseline reported neuropathy continues with patient stating it hasn't worsened but it hasn't improved either. Per Dr. [name] patient to increase Neurontin to 2 tablets (173m each) to take 3 times per day, patient verbalized understanding. Patient continues to report improvement in energy level. Labs reviewed with PA and Dr. MJulien Nordmann labs within treatment parameters and patient is ok to proceed with Cycle 4 of treatment today. All patient's questions answered to her satisfaction, denies further questions/concerns at this time and knows to call clinic should she have any. Study assigned Nivolumab vial numbers information sheet given to pharmacist CJaclyn Shaggyand treatment infusion info sheet given to TJonelle Sports RN. Patient knows to return for Cycle 5 on May 25th for labs/MD/Tx and for CT scan on 05/26, appt with Dr. MJulien Nordmannto follow soon after. I thanked patient for her time and participation in study and encouraged her to call with any questions/concerns. LAdele Dan RN, Clinical Research 06/18/2014 2:05 PM

## 2014-06-18 NOTE — Progress Notes (Signed)
06/18/14 - BMS 370 PRO's - Patient into the cancer center this morning for routine visit.  She completed the PRO's before going to the lab. She had her research labs drawn.  I thanked the patient for her continued support in this clinical trial. Remer Macho 06/18/14 - 11:15 am

## 2014-06-18 NOTE — Patient Instructions (Signed)
[facility] Discharge Instructions for Patients Receiving Chemotherapy  Today you received the following chemotherapy agents: nivolumab  To help prevent nausea and vomiting after your treatment, we encourage you to take your nausea medication.  Take it as often as prescribed.     If you develop nausea and vomiting that is not controlled by your nausea medication, call the clinic. If it is after clinic hours your family physician or the after hours number for the clinic or go to the Emergency Department.   BELOW ARE SYMPTOMS THAT SHOULD BE REPORTED IMMEDIATELY:  *FEVER GREATER THAN 100.5 F  *CHILLS WITH OR WITHOUT FEVER  NAUSEA AND VOMITING THAT IS NOT CONTROLLED WITH YOUR NAUSEA MEDICATION  *UNUSUAL SHORTNESS OF BREATH  *UNUSUAL BRUISING OR BLEEDING  TENDERNESS IN MOUTH AND THROAT WITH OR WITHOUT PRESENCE OF ULCERS  *URINARY PROBLEMS  *BOWEL PROBLEMS  UNUSUAL RASH Items with * indicate a potential emergency and should be followed up as soon as possible.  Feel free to call the clinic you have any questions or concerns. The clinic phone number is [phone].   I have been informed and understand all the instructions given to me. I know to contact the clinic, my physician, or go to the Emergency Department if any problems should occur. I do not have any questions at this time, but understand that I may call the clinic during office hours   should I have any questions or need assistance in obtaining follow up care.    __________________________________________  _____________  __________ Signature of Patient or Authorized Representative            Date                   Time    __________________________________________ Nurse's Signature    

## 2014-06-18 NOTE — Telephone Encounter (Signed)
per pof to sch pt appt-cld [facility] sch on & or 7:30 available-adv pt-pt agreed to 7:30-per Mirijana to sch in 10 slot stated per MM-sch appt-pt sch copy of sch

## 2014-06-19 ENCOUNTER — Ambulatory Visit: Payer: Medicare Other | Admitting: Internal Medicine

## 2014-06-20 NOTE — Patient Instructions (Signed)
Follow-up in 2 weeks Increase your gabapentin to 200 mg by mouth 3 times daily

## 2014-06-25 ENCOUNTER — Telehealth: Payer: Self-pay | Admitting: Internal Medicine

## 2014-06-25 ENCOUNTER — Encounter: Payer: Self-pay | Admitting: Medical Oncology

## 2014-06-25 ENCOUNTER — Other Ambulatory Visit: Payer: Self-pay | Admitting: Medical Oncology

## 2014-06-25 DIAGNOSIS — C3492 Malignant neoplasm of unspecified part of left bronchus or lung: Secondary | ICD-10-CM

## 2014-06-25 NOTE — Telephone Encounter (Signed)
pt called to get later time for appt...only day we can change is 6.22 with PA availability...done....pt aware of change

## 2014-06-25 NOTE — Telephone Encounter (Signed)
Called and left a message with added 6/8 and 6/22 appointments

## 2014-06-30 ENCOUNTER — Ambulatory Visit (HOSPITAL_COMMUNITY): Payer: Medicare Other

## 2014-06-30 ENCOUNTER — Other Ambulatory Visit: Payer: Self-pay | Admitting: Medical Oncology

## 2014-06-30 ENCOUNTER — Encounter: Payer: Self-pay | Admitting: Medical Oncology

## 2014-06-30 ENCOUNTER — Other Ambulatory Visit: Payer: Medicare Other

## 2014-06-30 DIAGNOSIS — C3492 Malignant neoplasm of unspecified part of left bronchus or lung: Secondary | ICD-10-CM

## 2014-07-02 ENCOUNTER — Other Ambulatory Visit (HOSPITAL_COMMUNITY)
Admission: RE | Admit: 2014-07-02 | Discharge: 2014-07-02 | Disposition: A | Discharge status: Home / Self Care | Payer: Medicare Other | Source: Ambulatory Visit | Attending: Internal Medicine | Admitting: Internal Medicine

## 2014-07-02 ENCOUNTER — Encounter: Payer: Self-pay | Admitting: Internal Medicine

## 2014-07-02 ENCOUNTER — Encounter: Payer: Self-pay | Admitting: Oncology

## 2014-07-02 ENCOUNTER — Ambulatory Visit (HOSPITAL_BASED_OUTPATIENT_CLINIC_OR_DEPARTMENT_OTHER): Payer: Medicare Other

## 2014-07-02 ENCOUNTER — Other Ambulatory Visit (HOSPITAL_BASED_OUTPATIENT_CLINIC_OR_DEPARTMENT_OTHER): Payer: Medicare Other

## 2014-07-02 ENCOUNTER — Ambulatory Visit (HOSPITAL_BASED_OUTPATIENT_CLINIC_OR_DEPARTMENT_OTHER): Payer: Medicare Other | Admitting: Internal Medicine

## 2014-07-02 ENCOUNTER — Encounter: Payer: Self-pay | Admitting: Medical Oncology

## 2014-07-02 VITALS — BP 133/76 | HR 89 | Temp 97.9°F | Resp 19 | Ht 62.0 in | Wt 170.3 lb

## 2014-07-02 DIAGNOSIS — C3490 Malignant neoplasm of unspecified part of unspecified bronchus or lung: Secondary | ICD-10-CM | POA: Insufficient documentation

## 2014-07-02 DIAGNOSIS — C3492 Malignant neoplasm of unspecified part of left bronchus or lung: Secondary | ICD-10-CM

## 2014-07-02 DIAGNOSIS — Z5112 Encounter for antineoplastic immunotherapy: Secondary | ICD-10-CM | POA: Diagnosis not present

## 2014-07-02 DIAGNOSIS — G620 Drug-induced polyneuropathy: Secondary | ICD-10-CM

## 2014-07-02 DIAGNOSIS — Z006 Encounter for examination for normal comparison and control in clinical research program: Secondary | ICD-10-CM | POA: Diagnosis not present

## 2014-07-02 DIAGNOSIS — T451X5A Adverse effect of antineoplastic and immunosuppressive drugs, initial encounter: Secondary | ICD-10-CM

## 2014-07-02 DIAGNOSIS — C3412 Malignant neoplasm of upper lobe, left bronchus or lung: Secondary | ICD-10-CM | POA: Diagnosis not present

## 2014-07-02 DIAGNOSIS — Z87891 Personal history of nicotine dependence: Secondary | ICD-10-CM | POA: Insufficient documentation

## 2014-07-02 LAB — COMPREHENSIVE METABOLIC PANEL (CC13)
ALBUMIN: 3.6 g/dL (ref 3.5–5.0)
ALT: 14 U/L (ref 0–55)
ANION GAP: 12 meq/L — AB (ref 3–11)
AST: 17 U/L (ref 5–34)
Alkaline Phosphatase: 69 U/L (ref 40–150)
BILIRUBIN TOTAL: 0.28 mg/dL (ref 0.20–1.20)
BUN: 22.7 mg/dL (ref 7.0–26.0)
CO2: 28 meq/L (ref 22–29)
Calcium: 9.3 mg/dL (ref 8.4–10.4)
Chloride: 102 mEq/L (ref 98–109)
Creatinine: 0.9 mg/dL (ref 0.6–1.1)
EGFR: 63 mL/min/{1.73_m2} — ABNORMAL LOW (ref >90)
Glucose: 107 mg/dl (ref 70–140)
Potassium: 3.8 mEq/L (ref 3.5–5.1)
SODIUM: 142 meq/L (ref 136–145)
Total Protein: 6.9 g/dL (ref 6.4–8.3)

## 2014-07-02 LAB — CBC WITH DIFFERENTIAL/PLATELET
BASO%: 0.6 % (ref 0.0–2.0)
BASOS ABS: 0 10*3/uL (ref 0.0–0.1)
EOS ABS: 0.2 10*3/uL (ref 0.0–0.5)
EOS%: 2.5 % (ref 0.0–7.0)
HEMATOCRIT: 34.5 % — AB (ref 34.8–46.6)
HEMOGLOBIN: 11.5 g/dL — AB (ref 11.6–15.9)
LYMPH%: 18.2 % (ref 14.0–49.7)
MCH: 32.7 pg (ref 25.1–34.0)
MCHC: 33.4 g/dL (ref 31.5–36.0)
MCV: 97.9 fL (ref 79.5–101.0)
MONO#: 0.6 10*3/uL (ref 0.1–0.9)
MONO%: 10.1 % (ref 0.0–14.0)
NEUT%: 68.6 % (ref 38.4–76.8)
NEUTROS ABS: 4.3 10*3/uL (ref 1.5–6.5)
Platelets: 231 10*3/uL (ref 145–400)
RBC: 3.52 10*6/uL — ABNORMAL LOW (ref 3.70–5.45)
RDW: 16.5 % — AB (ref 11.2–14.5)
WBC: 6.3 10*3/uL (ref 3.9–10.3)
lymph#: 1.1 10*3/uL (ref 0.9–3.3)

## 2014-07-02 LAB — LACTATE DEHYDROGENASE (CC13): LDH: 180 U/L (ref 125–245)

## 2014-07-02 LAB — PHOSPHORUS: PHOSPHORUS: 3.4 mg/dL (ref 2.5–4.6)

## 2014-07-02 LAB — MAGNESIUM (CC13): Magnesium: 1.7 mg/dl (ref 1.5–2.5)

## 2014-07-02 MED ORDER — SODIUM CHLORIDE 0.9 % IV SOLN
Freq: Once | INTRAVENOUS | Status: AC
  Administered 2014-07-02: 11:00:00 via INTRAVENOUS

## 2014-07-02 MED ORDER — HEPARIN SOD (PORK) LOCK FLUSH 100 UNIT/ML IV SOLN
500.0000 [IU] | Freq: Once | INTRAVENOUS | Status: AC | PRN
  Administered 2014-07-02: 500 [IU]
  Filled 2014-07-02: qty 5

## 2014-07-02 MED ORDER — SODIUM CHLORIDE 0.9 % IJ SOLN
10.0000 mL | INTRAMUSCULAR | Status: DC | PRN
  Administered 2014-07-02: 10 mL
  Filled 2014-07-02: qty 10

## 2014-07-02 MED ORDER — SODIUM CHLORIDE 0.9 % IV SOLN
240.0000 mg | Freq: Once | INTRAVENOUS | Status: AC
  Administered 2014-07-02: 240 mg via INTRAVENOUS
  Filled 2014-07-02: qty 24

## 2014-07-02 NOTE — Patient Instructions (Signed)
[place] Discharge Instructions for Patients Receiving Chemotherapy  Today you received the following chemotherapy agents Nivolumab.  To help prevent nausea and vomiting after your treatment, we encourage you to take your nausea medication Compazine 10 mg every six hours if needed or thirty minutes before meals and at bedtime.   If you develop nausea and vomiting that is not controlled by your nausea medication, call the clinic.   BELOW ARE SYMPTOMS THAT SHOULD BE REPORTED IMMEDIATELY:  *FEVER GREATER THAN 100.5 F  *CHILLS WITH OR WITHOUT FEVER  NAUSEA AND VOMITING THAT IS NOT CONTROLLED WITH YOUR NAUSEA MEDICATION  *UNUSUAL SHORTNESS OF BREATH  *UNUSUAL BRUISING OR BLEEDING  TENDERNESS IN MOUTH AND THROAT WITH OR WITHOUT PRESENCE OF ULCERS  *URINARY PROBLEMS  *BOWEL PROBLEMS  UNUSUAL RASH Items with * indicate a potential emergency and should be followed up as soon as possible.  Feel free to call the clinic you have any questions or concerns. The clinic phone number is [phone].  Please show the [place] at check-in to the Emergency Department and triage nurse.  Nivolumab injection What is this medicine? NIVOLUMAB (nye VOL ue mab) is used to treat certain types of melanoma and lung cancer. This medicine may be used for other purposes; ask your health care provider or pharmacist if you have questions. COMMON BRAND NAME(S): Opdivo What should I tell my health care provider before I take this medicine? They need to know if you have any of these conditions: -eye disease, vision problems -history of pancreatitis -immune system problems -inflammatory bowel disease -kidney disease -liver disease -lung disease -lupus -myasthenia gravis -multiple sclerosis -organ transplant -stomach or intestine problems -thyroid disease -tingling of the fingers or toes, or other nerve disorder -an unusual or allergic reaction to nivolumab, other  medicines, foods, dyes, or preservatives -pregnant or trying to get pregnant -breast-feeding How should I use this medicine? This medicine is for infusion into a vein. It is given by a health care professional in a hospital or clinic setting. A special MedGuide will be given to you before each treatment. Be sure to read this information carefully each time. Talk to your pediatrician regarding the use of this medicine in children. Special care may be needed. Overdosage: If you think you've taken too much of this medicine contact a poison control center or emergency room at once. Overdosage: If you think you have taken too much of this medicine contact a poison control center or emergency room at once. NOTE: This medicine is only for you. Do not share this medicine with others. What if I miss a dose? It is important not to miss your dose. Call your doctor or health care professional if you are unable to keep an appointment. What may interact with this medicine? Interactions have not been studied. This list may not describe all possible interactions. Give your health care provider a list of all the medicines, herbs, non-prescription drugs, or dietary supplements you use. Also tell them if you smoke, drink alcohol, or use illegal drugs. Some items may interact with your medicine. What should I watch for while using this medicine? Tell your doctor or healthcare professional if your symptoms do not start to get better or if they get worse. Your condition will be monitored carefully while you are receiving this medicine. You may need blood work done while you are taking this medicine. What side effects may I notice from receiving this medicine? Side effects that you should report to  your doctor or health care professional as soon as possible: -allergic reactions like skin rash, itching or hives, swelling of the face, lips, or tongue -black, tarry stools -bloody or watery diarrhea -changes in  vision -chills -cough -depressed mood -eye pain -feeling anxious -fever -general ill feeling or flu-like symptoms -hair loss -loss of appetite -low blood counts - this medicine may decrease the number of white blood cells, red blood cells and platelets. You may be at increased risk for infections and bleeding -pain, tingling, numbness in the hands or feet -redness, blistering, peeling or loosening of the skin, including inside the mouth -red pinpoint spots on skin -signs of decreased platelets or bleeding - bruising, pinpoint red spots on the skin, black, tarry stools, blood in the urine -signs of decreased red blood cells - unusually weak or tired, feeling faint or lightheaded, falls -signs of infection - fever or chills, cough, sore throat, pain or trouble passing urine -signs and symptoms of a dangerous change in heartbeat or heart rhythm like chest pain; dizziness; fast or irregular heartbeat; palpitations; feeling faint or lightheaded, falls; breathing problems -signs and symptoms of high blood sugar such as dizziness; dry mouth; dry skin; fruity breath; nausea; stomach pain; increased hunger or thirst; increased urination -signs and symptoms of kidney injury like trouble passing urine or change in the amount of urine -signs and symptoms of liver injury like dark yellow or brown urine; general ill feeling or flu-like symptoms; light-colored stools; loss of appetite; nausea; right upper belly pain; unusually weak or tired; yellowing of the eyes or skin -signs and symptoms of increased potassium like muscle weakness; chest pain; or fast, irregular heartbeat -signs and symptoms of low potassium like muscle cramps or muscle pain; chest pain; dizziness; feeling faint or lightheaded, falls; palpitations; breathing problems; or fast, irregular heartbeat -swelling of the ankles, feet, hands -weight gainSide effects that usually do not require medical attention (report to your doctor or health  care professional if they continue or are bothersome): -constipation -general ill feeling or flu-like symptoms -hair loss -loss of appetite -nausea, vomiting This list may not describe all possible side effects. Call your doctor for medical advice about side effects. You may report side effects to FDA at 1-800-FDA-1088. Where should I keep my medicine? This drug is given in a hospital or clinic and will not be stored at home. NOTE: This sheet is a summary. It may not cover all possible information. If you have questions about this medicine, talk to your doctor, pharmacist, or health care provider.  2015, Elsevier/Gold Standard. (2013-04-15 13:18:19)

## 2014-07-02 NOTE — Progress Notes (Signed)
07/02/14 - BMS 370 PRO's - Patient into cancer center this morning for routine visit.  She completed the PRO's before going to the lab. I thanked the patient for her continued support in this clinical trial. Tammie Clay  07/02/14 - 8:45 am

## 2014-07-02 NOTE — Progress Notes (Signed)
[facility] [phone]   Fax:([phone]) [address]  [place] [address]  DIAGNOSIS: Stage IV (T2, N2, M1a) non-small cell lung cancer, squamous cell carcinoma presented with central left upper lobe lung mass with associated postobstructive atelectasis as well as ipsilateral and subcarinal lymphadenopathy as well as pleural tumor spread diagnosed in October 2015.  PRIOR THERAPY: Systemic chemotherapy with carboplatin for AUC of 5 on day 1 and Abraxane 100 MG/M2 on days 1, 8 and 15 every 3 weeks. Status post 6 cycles.   CURRENT THERAPY: Nivolumab per research protocol BMS 370 group B, arm A, given every 2 weeks. Status post 4 cycles  INTERVAL HISTORY: Tammie Clay 69 y.o. female returns to the clinic today for follow-up visit accompanied by her significant other. She is currently undergoing treatment with immunotherapy with Nivolumab 3 MG/KG every 2 weeks is status post 4 cycles. She is tolerating her treatment fairly well with no significant adverse effect except for the persistent peripheral neuropathy from her previous treatment with carboplatin and Abraxane. She is currently on treatment with Neurontin 200 mg by mouth 3 times a day. She did not notice much improvement in her neuropathy after increasing the dose of Neurontin. She denied having any significant fever or chills. The patient denied having any significant chest pain, shortness of breath, cough or hemoptysis. She has no nausea or vomiting. She denied having any significant weight loss or night sweats. She is here today to start cycle #5 of her immunotherapy.  MEDICAL HISTORY: Past Medical History  Diagnosis Date  . Hypertension   . COPD (chronic obstructive pulmonary disease)   . Hyperlipidemia   . GERD (gastroesophageal reflux disease)   . Osteopenia   . Insomnia   . Mediastinal lymphadenopathy 11/19/13    PER CT  .  Multiple lung nodules on CT 11/19/13    LEFT LUNG BASE  . Lung mass 11/19/13    RUL PER CT  . Coronary artery calcification seen on CAT scan 11/21/2013  . COPD (chronic obstructive pulmonary disease) 11/21/2013  . Pneumonia     hx of  . Anxiety     ALLERGIES:  has No Known Allergies.  MEDICATIONS:  Current Outpatient Prescriptions  Medication Sig Dispense Refill  . acetaminophen (TYLENOL) 325 MG tablet Take 650 mg by mouth. Take 2 tablets every morning    . albuterol (PROVENTIL HFA;VENTOLIN HFA) 108 (90 BASE) MCG/ACT inhaler Inhale 2 puffs into the lungs every 6 (six) hours as needed for wheezing.    . calcium carbonate (TUMS - DOSED IN MG ELEMENTAL CALCIUM) 500 MG chewable tablet Chew 1 tablet by mouth daily.    . diazepam (VALIUM) 5 MG tablet Take 5 mg by mouth at bedtime.    . docusate sodium (COLACE) 100 MG capsule Take 100 mg by mouth 2 (two) times daily.    . DULoxetine (CYMBALTA) 30 MG capsule TAKE 1 CAPSULE (30 MG TOTAL) BY MOUTH DAILY. 30 capsule 2  . gabapentin (NEURONTIN) 100 MG capsule Take 1 capsule (100 mg total) by mouth 3 (three) times daily. (Patient taking differently: Take 100 mg by mouth 3 (three) times daily. Taking 2 capsules TID) 90 capsule 1  . HYDROcodone-acetaminophen (NORCO) 10-325 MG per tablet Take 1 tablet by mouth every 6 (six) hours as needed.    . lidocaine-prilocaine (EMLA) cream Apply 1 application topically as needed. 30 g 0  . lisinopril-hydrochlorothiazide (PRINZIDE,ZESTORETIC) 20-12.5  MG per tablet Take 2 tablets by mouth every morning.     . loratadine (CLARITIN) 10 MG tablet Take 10 mg by mouth daily.    . magnesium oxide (MAG-OX) 400 (241.3 MG) MG tablet Take 1 tablet (400 mg total) by mouth 2 (two) times daily. 60 tablet 0  . metoprolol succinate (TOPROL-XL) 50 MG 24 hr tablet Take 50 mg by mouth every morning. Take with or immediately following a meal.    . Multiple Vitamin (MULTIVITAMIN WITH MINERALS) TABS Take 1 tablet by mouth daily.    .  polyethylene glycol (MIRALAX / GLYCOLAX) packet Take 17 g by mouth daily as needed.    . prochlorperazine (COMPAZINE) 10 MG tablet Take 1 tablet (10 mg total) by mouth every 6 (six) hours as needed for nausea or vomiting. 30 tablet 1  . ranitidine (ZANTAC) 150 MG tablet Take 150 mg by mouth 2 (two) times daily.    . SYMBICORT 80-4.5 MCG/ACT inhaler   3   No current facility-administered medications for this visit.    SURGICAL HISTORY:  Past Surgical History  Procedure Laterality Date  .  c section x 2    . Urethral cyst removed    . Tonsillectomy    . Appendectomy    . Colonoscopy with propofol N/A 07/17/2012    Procedure: COLONOSCOPY WITH PROPOFOL;  Surgeon: Garlan Fair, MD;  Location: [facility];  Service: Endoscopy;  Laterality: N/A;  . Esophagogastroduodenoscopy (egd) with propofol N/A 07/17/2012    Procedure: ESOPHAGOGASTRODUODENOSCOPY (EGD) WITH PROPOFOL;  Surgeon: Garlan Fair, MD;  Location: [facility];  Service: Endoscopy;  Laterality: N/A;  . Video bronchoscopy with endobronchial ultrasound N/A 12/03/2013    Procedure: VIDEO BRONCHOSCOPY WITH ENDOBRONCHIAL ULTRASOUND WITH BIOPSIES;  Surgeon: Grace Isaac, MD;  Location: [facility];  Service: Thoracic;  Laterality: N/A;  . Portacath placement Left 12/10/2013    Procedure: INSERTION PORT-A-CATH;  Surgeon: Grace Isaac, MD;  Location: [facility];  Service: Thoracic;  Laterality: Left;    REVIEW OF SYSTEMS:  A comprehensive review of systems was negative except for: Neurological: positive for paresthesia   PHYSICAL EXAMINATION: General appearance: alert, cooperative, fatigued and no distress Head: Normocephalic, without obvious abnormality, atraumatic Neck: no adenopathy, no JVD, supple, symmetrical, trachea midline and thyroid not enlarged, symmetric, no tenderness/mass/nodules Lymph nodes: Cervical, supraclavicular, and axillary nodes normal. Resp: clear to auscultation bilaterally Back: symmetric, no curvature. ROM  normal. No CVA tenderness. Cardio: regular rate and rhythm, S1, S2 normal, no murmur, click, rub or gallop GI: soft, non-tender; bowel sounds normal; no masses,  no organomegaly Genitalia: defer exam Extremities: extremities normal, atraumatic, no cyanosis or edema Neurologic: Alert and oriented X 3, normal strength and tone. Normal symmetric reflexes. Normal coordination and gait  ECOG PERFORMANCE STATUS: 1 - Symptomatic but completely ambulatory  Blood pressure 133/76, pulse 89, temperature 97.9 F (36.6 C), temperature source Oral, resp. rate 19, height '5\' 2"'$  (1.575 m), weight 170 lb 4.8 oz (77.248 kg), SpO2 100 %.  LABORATORY DATA: Lab Results  Component Value Date   WBC 6.3 07/02/2014   HGB 11.5* 07/02/2014   HCT 34.5* 07/02/2014   MCV 97.9 07/02/2014   PLT 231 07/02/2014      Chemistry      Component Value Date/Time   NA 142 07/02/2014 0856   NA 138 12/10/2013 0637   K 3.8 07/02/2014 0856   K 4.7 12/10/2013 0637   CL 99 12/10/2013 0637   CO2 28 07/02/2014 0856  CO2 28 12/10/2013 0637   BUN 22.7 07/02/2014 0856   BUN 22 12/10/2013 0637   CREATININE 0.9 07/02/2014 0856   CREATININE 0.75 12/10/2013 0637   CREATININE 0.80 11/22/2013 1054      Component Value Date/Time   CALCIUM 9.3 07/02/2014 0856   CALCIUM 9.8 12/10/2013 0637   ALKPHOS 69 07/02/2014 0856   ALKPHOS 77 12/10/2013 0637   AST 17 07/02/2014 0856   AST 16 12/10/2013 0637   ALT 14 07/02/2014 0856   ALT 16 12/10/2013 0637   BILITOT 0.28 07/02/2014 0856   BILITOT 0.4 12/10/2013 0637       RADIOGRAPHIC STUDIES:  ASSESSMENT AND PLAN: This is a very pleasant 69 years old white female recently diagnosed with:  1) Stage IV non-small cell lung cancer, squamous cell carcinoma completed systemic chemotherapy with carboplatin and Abraxane status post 6 cycles.  She is currently on treatment with immunotherapy according to the BMS checkmate 370 clinical trial with Nivolumab.she status post 4 cycles and  tolerating her treatment fairly well.we will proceed with cycle #5 today as scheduled. The patient is scheduled to have repeat CT scan of the chest, abdomen and pelvis tomorrow for restaging of her disease. For the peripheral neuropathy, the patient will continue her current treatment with Neurontin. The patient was advised to call immediately if she has any concerning symptoms in the interval.  The patient voices understanding of current disease status and treatment options and is in agreement with the current care plan.  All questions were answered. The patient knows to call the clinic with any problems, questions or concerns. We can certainly see the patient much sooner if necessary.  Disclaimer: This note was dictated with voice recognition software. Similar sounding words can inadvertently be transcribed and may not be corrected upon review.

## 2014-07-02 NOTE — Progress Notes (Signed)
Discharged at 1210 with spouse, ambulatory in no distress.

## 2014-07-02 NOTE — Progress Notes (Signed)
BMS [id number]; Group B: Arm A: Cycle 5, Day 1 Patient here today for Cycle 5. PRO's completed with research assistant, Remer Macho, prior to lab appointment. I met with patient and Shela Nevin during patient's appointment with Dr.Mohamed. Resting vital signs with O2 collected. Physical measurements, targeted physical exam, ECOG performance status, adverse events assessment, protocol required labs and review of concomitant medications completed. Patient denies any adverse events or new complaints. Baseline reported neuropathy continues with patient confirming neuropathy continues to be at the same as baseline, has not worsened and with no improvment. Labs reviewed by Dr. [name] and MD, labs within treatment parameters and patient is ok to proceed with Cycle 5 of treatment today. All patient's questions answered to her satisfaction, denies further questions/concerns at this time and knows to call clinic should she have any. Study assigned Nivolumab vial numbers information sheet given to pharmacist Lattie Haw and treatment infusion info sheet given to infusion RN, Mackie Pai. Patient knows to return tomorrow morning for CT scan and MD appointment for review of scan. Confirmed with patient of time to be here as well as that she does have contrast and directions of how and when to drink. Patient denies further questions at this time, I thanked her for her time and participation in study and encouraged her to call Dr. [name] or me with any questions/concerns. [name], RN, Clinical Research 07/02/2014 1:21 PM

## 2014-07-03 ENCOUNTER — Ambulatory Visit (HOSPITAL_BASED_OUTPATIENT_CLINIC_OR_DEPARTMENT_OTHER): Payer: Medicare Other | Admitting: Internal Medicine

## 2014-07-03 ENCOUNTER — Ambulatory Visit (HOSPITAL_COMMUNITY)
Admission: RE | Admit: 2014-07-03 | Discharge: 2014-07-03 | Disposition: A | Discharge status: Home / Self Care | Payer: Medicare Other | Source: Ambulatory Visit | Attending: Nurse Practitioner | Admitting: Nurse Practitioner

## 2014-07-03 ENCOUNTER — Ambulatory Visit (HOSPITAL_COMMUNITY): Payer: Medicare Other

## 2014-07-03 ENCOUNTER — Encounter (HOSPITAL_COMMUNITY): Payer: Self-pay

## 2014-07-03 VITALS — BP 135/74 | HR 104 | Temp 98.1°F | Resp 18 | Ht 62.0 in | Wt 169.4 lb

## 2014-07-03 DIAGNOSIS — G609 Hereditary and idiopathic neuropathy, unspecified: Secondary | ICD-10-CM

## 2014-07-03 DIAGNOSIS — C3490 Malignant neoplasm of unspecified part of unspecified bronchus or lung: Secondary | ICD-10-CM | POA: Insufficient documentation

## 2014-07-03 DIAGNOSIS — C3492 Malignant neoplasm of unspecified part of left bronchus or lung: Secondary | ICD-10-CM

## 2014-07-03 DIAGNOSIS — C3402 Malignant neoplasm of left main bronchus: Secondary | ICD-10-CM | POA: Insufficient documentation

## 2014-07-03 DIAGNOSIS — C3412 Malignant neoplasm of upper lobe, left bronchus or lung: Secondary | ICD-10-CM | POA: Diagnosis not present

## 2014-07-03 MED ORDER — IOHEXOL 300 MG/ML  SOLN
100.0000 mL | Freq: Once | INTRAMUSCULAR | Status: AC | PRN
  Administered 2014-07-03: 100 mL via INTRAVENOUS

## 2014-07-03 NOTE — Progress Notes (Signed)
[place] [phone]   Fax:([phone]) [place] [address] [employer] [address] [place] [address]  DIAGNOSIS: Stage IV (T2, N2, M1a) non-small cell lung cancer, squamous cell carcinoma presented with central left upper lobe lung mass with associated postobstructive atelectasis as well as ipsilateral and subcarinal lymphadenopathy as well as pleural tumor spread diagnosed in October 2015.  PRIOR THERAPY: Systemic chemotherapy with carboplatin for AUC of 5 on day 1 and Abraxane 100 MG/M2 on days 1, 8 and 15 every 3 weeks. Status post 6 cycles.   CURRENT THERAPY: Nivolumab per research protocol BMS 370 group B, arm A, given every 2 weeks. Status post 5 cycles  INTERVAL HISTORY: Shasta A Shin 69 y.o. female returns to the clinic today for follow-up visit accompanied by her significant other. She is currently undergoing treatment with immunotherapy with Nivolumab 3 MG/KG every 2 weeks is status post 5 cycles. She'll receive cycle #5 yesterday. She denied having any significant fever or chills. The patient denied having any significant chest pain, shortness of breath, cough or hemoptysis. She has no nausea or vomiting. She denied having any significant weight loss or night sweats. She had repeat CT scan of the chest, abdomen and pelvis performed earlier today and she is here for evaluation and discussion of her scan results.  MEDICAL HISTORY: Past Medical History  Diagnosis Date  . Hypertension   . COPD (chronic obstructive pulmonary disease)   . Hyperlipidemia   . GERD (gastroesophageal reflux disease)   . Osteopenia   . Insomnia   . Mediastinal lymphadenopathy 11/19/13    PER CT  . Multiple lung nodules on CT 11/19/13    LEFT LUNG BASE  . Lung mass 11/19/13    RUL PER CT  . Coronary artery calcification seen on CAT scan 11/21/2013  . COPD (chronic obstructive pulmonary disease) 11/21/2013  . Pneumonia     hx  of  . Anxiety     ALLERGIES:  has No Known Allergies.  MEDICATIONS:  Current Outpatient Prescriptions  Medication Sig Dispense Refill  . acetaminophen (TYLENOL) 325 MG tablet Take 650 mg by mouth. Take 2 tablets every morning    . albuterol (PROVENTIL HFA;VENTOLIN HFA) 108 (90 BASE) MCG/ACT inhaler Inhale 2 puffs into the lungs every 6 (six) hours as needed for wheezing.    . calcium carbonate (TUMS - DOSED IN MG ELEMENTAL CALCIUM) 500 MG chewable tablet Chew 1 tablet by mouth daily.    . diazepam (VALIUM) 5 MG tablet Take 5 mg by mouth at bedtime.    . docusate sodium (COLACE) 100 MG capsule Take 100 mg by mouth 2 (two) times daily.    . DULoxetine (CYMBALTA) 30 MG capsule TAKE 1 CAPSULE (30 MG TOTAL) BY MOUTH DAILY. 30 capsule 2  . gabapentin (NEURONTIN) 100 MG capsule Take 1 capsule (100 mg total) by mouth 3 (three) times daily. (Patient taking differently: Take 100 mg by mouth 3 (three) times daily. Taking 2 capsules TID) 90 capsule 1  . HYDROcodone-acetaminophen (NORCO) 10-325 MG per tablet Take 1 tablet by mouth every 6 (six) hours as needed.    . lidocaine-prilocaine (EMLA) cream Apply 1 application topically as needed. 30 g 0  . lisinopril-hydrochlorothiazide (PRINZIDE,ZESTORETIC) 20-12.5 MG per tablet Take 2 tablets by mouth every morning.     . loratadine (CLARITIN) 10 MG tablet Take 10 mg by mouth daily.    . magnesium oxide (MAG-OX) 400 (241.3 MG)  MG tablet Take 1 tablet (400 mg total) by mouth 2 (two) times daily. 60 tablet 0  . metoprolol succinate (TOPROL-XL) 50 MG 24 hr tablet Take 50 mg by mouth every morning. Take with or immediately following a meal.    . Multiple Vitamin (MULTIVITAMIN WITH MINERALS) TABS Take 1 tablet by mouth daily.    . polyethylene glycol (MIRALAX / GLYCOLAX) packet Take 17 g by mouth daily as needed.    . prochlorperazine (COMPAZINE) 10 MG tablet Take 1 tablet (10 mg total) by mouth every 6 (six) hours as needed for nausea or vomiting. 30 tablet 1  .  ranitidine (ZANTAC) 150 MG tablet Take 150 mg by mouth 2 (two) times daily.    . SYMBICORT 80-4.5 MCG/ACT inhaler   3   No current facility-administered medications for this visit.    SURGICAL HISTORY:  Past Surgical History  Procedure Laterality Date  .  c section x 2    . Urethral cyst removed    . Tonsillectomy    . Appendectomy    . Colonoscopy with propofol N/A 07/17/2012    Procedure: COLONOSCOPY WITH PROPOFOL;  Surgeon: Garlan Fair, MD;  Location: [facility];  Service: Endoscopy;  Laterality: N/A;  . Esophagogastroduodenoscopy (egd) with propofol N/A 07/17/2012    Procedure: ESOPHAGOGASTRODUODENOSCOPY (EGD) WITH PROPOFOL;  Surgeon: Garlan Fair, MD;  Location: [facility];  Service: Endoscopy;  Laterality: N/A;  . Video bronchoscopy with endobronchial ultrasound N/A 12/03/2013    Procedure: VIDEO BRONCHOSCOPY WITH ENDOBRONCHIAL ULTRASOUND WITH BIOPSIES;  Surgeon: Grace Isaac, MD;  Location: [facility];  Service: Thoracic;  Laterality: N/A;  . Portacath placement Left 12/10/2013    Procedure: INSERTION PORT-A-CATH;  Surgeon: Grace Isaac, MD;  Location: [facility];  Service: Thoracic;  Laterality: Left;    REVIEW OF SYSTEMS:  A comprehensive review of systems was negative except for: Neurological: positive for paresthesia   PHYSICAL EXAMINATION: General appearance: alert, cooperative, fatigued and no distress Head: Normocephalic, without obvious abnormality, atraumatic Neck: no adenopathy, no JVD, supple, symmetrical, trachea midline and thyroid not enlarged, symmetric, no tenderness/mass/nodules Lymph nodes: Cervical, supraclavicular, and axillary nodes normal. Resp: clear to auscultation bilaterally Back: symmetric, no curvature. ROM normal. No CVA tenderness. Cardio: regular rate and rhythm, S1, S2 normal, no murmur, click, rub or gallop GI: soft, non-tender; bowel sounds normal; no masses,  no organomegaly Genitalia: defer exam Extremities: extremities normal,  atraumatic, no cyanosis or edema Neurologic: Alert and oriented X 3, normal strength and tone. Normal symmetric reflexes. Normal coordination and gait  ECOG PERFORMANCE STATUS: 1 - Symptomatic but completely ambulatory  Blood pressure 135/74, pulse 104, temperature 98.1 F (36.7 C), temperature source Oral, resp. rate 18, height '5\' 2"'$  (1.575 m), weight 169 lb 6.4 oz (76.839 kg), SpO2 98 %.  LABORATORY DATA: Lab Results  Component Value Date   WBC 6.3 07/02/2014   HGB 11.5* 07/02/2014   HCT 34.5* 07/02/2014   MCV 97.9 07/02/2014   PLT 231 07/02/2014      Chemistry      Component Value Date/Time   NA 142 07/02/2014 0856   NA 138 12/10/2013 0637   K 3.8 07/02/2014 0856   K 4.7 12/10/2013 0637   CL 99 12/10/2013 0637   CO2 28 07/02/2014 0856   CO2 28 12/10/2013 0637   BUN 22.7 07/02/2014 0856   BUN 22 12/10/2013 0637   CREATININE 0.9 07/02/2014 0856   CREATININE 0.75 12/10/2013 0637   CREATININE 0.80 11/22/2013 1054  Component Value Date/Time   CALCIUM 9.3 07/02/2014 0856   CALCIUM 9.8 12/10/2013 0637   ALKPHOS 69 07/02/2014 0856   ALKPHOS 77 12/10/2013 0637   AST 17 07/02/2014 0856   AST 16 12/10/2013 0637   ALT 14 07/02/2014 0856   ALT 16 12/10/2013 0637   BILITOT 0.28 07/02/2014 0856   BILITOT 0.4 12/10/2013 0637       RADIOGRAPHIC STUDIES: Ct Chest W Contrast  07/03/2014   CLINICAL DATA:  Restaging stage IV squamous cell carcinoma of the long diagnosed in October 2015. Chemotherapy ongoing. Subsequent encounter.  EXAM: CT CHEST, ABDOMEN, AND PELVIS WITH CONTRAST  TECHNIQUE: Multidetector CT imaging of the chest, abdomen and pelvis was performed following the standard protocol during bolus administration of intravenous contrast.  CONTRAST:  149m OMNIPAQUE IOHEXOL 300 MG/ML  SOLN  COMPARISON:  Prior examinations 04/18/2014 and 02/14/2014.  FINDINGS: CT CHEST FINDINGS  RECIST 1.1  Target Lesions:  1. Left mediastinum, soft tissue thickening: 2.6 x 2.3 cm on image  26 (previously 2.8 x 2.2 cm). 2. Right middle lobe branching nodularity: On the current examination, this has the appearance of focal thickening along the major fissure, best seen on sagittal image number 28. Nonmeasurable. 3. Left upper lobe nodule: 6 mm left upper lobe nodule on image number 18, unchanged. Non-target Lesions:  1. None. Mediastinum/Nodes: Interval development of progressive mediastinal lymphadenopathy. There is a subcarinal node measuring 1.8 cm short axis on image 26. There is a right paraesophageal node measuring 1.3 cm on image 28. There is a left infrahilar node measuring 10 mm on image 27. Small right paratracheal, AP window and right hilar lymph nodes are unchanged. There is no axillary lymphadenopathy. The thyroid gland, trachea and esophagus demonstrate no significant findings. The heart size is normal. There is no pericardial effusion.Again noted is atherosclerosis of the aorta, great vessels and coronary arteries. Left subclavian Port-A-Cath tip is at the SVC right atrial junction.  Lungs/Pleura: There is no pleural effusion.As above, residual mass-like density extending anteriorly from the left hilum along the mediastinal border is not significantly changed, measuring approximately 2.6 x 2.3 cm. Associated narrowing of the left upper lobe bronchus is stable. Irregular 6 mm left upper lobe nodule on image 18 is stable. There is a new subpleural nodule in the left lower lobe along the diaphragmatic surface, measuring 8 mm on image 38. In addition, there is a new 4 mm subpleural nodule in the left lower lobe on image 31. In the right lung, there is a stable calcified right upper lobe granuloma. Ill-defined right lower lobe ground-glass nodular density appears slightly larger, measuring 8 mm on image 31. Moderate emphysematous changes are again noted.  Musculoskeletal/Chest wall: No chest wall mass or suspicious osseous findings.  CT ABDOMEN AND PELVIS FINDINGS  Hepatobiliary: Several tiny  low-density hepatic lesions are unchanged, benign based on stability. No new or enlarging hepatic lesions identified. No evidence of gallstones, gallbladder wall thickening or biliary dilatation.  Pancreas: Unremarkable. No pancreatic ductal dilatation or surrounding inflammatory changes.  Spleen: Normal in size without focal abnormality.  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 Calcifications centrally within the left kidney are stable, likely vascular. No evidence of renal mass or hydronephrosis. The bladder is nearly empty and suboptimally evaluated. A degree of pelvic floor laxity is likely.  Stomach/Bowel: No evidence of bowel wall thickening, distention or surrounding inflammatory change.Diverticular changes of the sigmoid colon are noted. There is moderate stool throughout  the colon.  Vascular/Lymphatic: Small lymph nodes within the porta hepatis measuring up to 8 mm on image 55 are stable. There is no retroperitoneal or mesenteric lymphadenopathy. There is extensive atherosclerosis of the aorta, its branches and the iliac arteries. There is stable dilatation of the aorta to 3.1 cm on image 63 with associated mural thrombus.  Reproductive: Low-density left adnexal lesion measures 4.6 cm on image 96 (previously 4.9 cm). The uterus and right ovary appear normal.  Other: No evidence of abdominal wall mass or hernia.  Musculoskeletal: No acute or significant osseous findings. There are degenerative and postsurgical changes in the lower lumbar spine.  IMPRESSION: 1. Interval development of subcarinal, paraesophageal and left hilar adenopathy consistent with metastatic lung cancer. 2. There are new subpleural nodules in the left lung which may reflect metastases as well. The 6 mm left upper lobe nodule is unchanged. Lingular perihilar density is also stable. 3. No evidence of metastatic disease within the abdomen or pelvis. 4. Stable to slightly  decreased size of cystic left adnexal lesion. 5. Extensive atherosclerosis.   Electronically Signed   By: Richardean Sale M.D.   On: 07/03/2014 08:17   Ct Abdomen Pelvis W Contrast  07/03/2014   CLINICAL DATA:  Restaging stage IV squamous cell carcinoma of the long diagnosed in October 2015. Chemotherapy ongoing. Subsequent encounter.  EXAM: CT CHEST, ABDOMEN, AND PELVIS WITH CONTRAST  TECHNIQUE: Multidetector CT imaging of the chest, abdomen and pelvis was performed following the standard protocol during bolus administration of intravenous contrast.  CONTRAST:  123m OMNIPAQUE IOHEXOL 300 MG/ML  SOLN  COMPARISON:  Prior examinations 04/18/2014 and 02/14/2014.  FINDINGS: CT CHEST FINDINGS  RECIST 1.1  Target Lesions:  1. Left mediastinum, soft tissue thickening: 2.6 x 2.3 cm on image 26 (previously 2.8 x 2.2 cm). 2. Right middle lobe branching nodularity: On the current examination, this has the appearance of focal thickening along the major fissure, best seen on sagittal image number 28. Nonmeasurable. 3. Left upper lobe nodule: 6 mm left upper lobe nodule on image number 18, unchanged. Non-target Lesions:  1. None. Mediastinum/Nodes: Interval development of progressive mediastinal lymphadenopathy. There is a subcarinal node measuring 1.8 cm short axis on image 26. There is a right paraesophageal node measuring 1.3 cm on image 28. There is a left infrahilar node measuring 10 mm on image 27. Small right paratracheal, AP window and right hilar lymph nodes are unchanged. There is no axillary lymphadenopathy. The thyroid gland, trachea and esophagus demonstrate no significant findings. The heart size is normal. There is no pericardial effusion.Again noted is atherosclerosis of the aorta, great vessels and coronary arteries. Left subclavian Port-A-Cath tip is at the SVC right atrial junction.  Lungs/Pleura: There is no pleural effusion.As above, residual mass-like density extending anteriorly from the left hilum  along the mediastinal border is not significantly changed, measuring approximately 2.6 x 2.3 cm. Associated narrowing of the left upper lobe bronchus is stable. Irregular 6 mm left upper lobe nodule on image 18 is stable. There is a new subpleural nodule in the left lower lobe along the diaphragmatic surface, measuring 8 mm on image 38. In addition, there is a new 4 mm subpleural nodule in the left lower lobe on image 31. In the right lung, there is a stable calcified right upper lobe granuloma. Ill-defined right lower lobe ground-glass nodular density appears slightly larger, measuring 8 mm on image 31. Moderate emphysematous changes are again noted.  Musculoskeletal/Chest wall: No chest wall mass or suspicious  osseous findings.  CT ABDOMEN AND PELVIS FINDINGS  Hepatobiliary: Several tiny low-density hepatic lesions are unchanged, benign based on stability. No new or enlarging hepatic lesions identified. No evidence of gallstones, gallbladder wall thickening or biliary dilatation.  Pancreas: Unremarkable. No pancreatic ductal dilatation or surrounding inflammatory changes.  Spleen: Normal in size without focal abnormality.  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 Calcifications centrally within the left kidney are stable, likely vascular. No evidence of renal mass or hydronephrosis. The bladder is nearly empty and suboptimally evaluated. A degree of pelvic floor laxity is likely.  Stomach/Bowel: No evidence of bowel wall thickening, distention or surrounding inflammatory change.Diverticular changes of the sigmoid colon are noted. There is moderate stool throughout the colon.  Vascular/Lymphatic: Small lymph nodes within the porta hepatis measuring up to 8 mm on image 55 are stable. There is no retroperitoneal or mesenteric lymphadenopathy. There is extensive atherosclerosis of the aorta, its branches and the iliac arteries. There is stable  dilatation of the aorta to 3.1 cm on image 63 with associated mural thrombus.  Reproductive: Low-density left adnexal lesion measures 4.6 cm on image 96 (previously 4.9 cm). The uterus and right ovary appear normal.  Other: No evidence of abdominal wall mass or hernia.  Musculoskeletal: No acute or significant osseous findings. There are degenerative and postsurgical changes in the lower lumbar spine.  IMPRESSION: 1. Interval development of subcarinal, paraesophageal and left hilar adenopathy consistent with metastatic lung cancer. 2. There are new subpleural nodules in the left lung which may reflect metastases as well. The 6 mm left upper lobe nodule is unchanged. Lingular perihilar density is also stable. 3. No evidence of metastatic disease within the abdomen or pelvis. 4. Stable to slightly decreased size of cystic left adnexal lesion. 5. Extensive atherosclerosis.   Electronically Signed   By: Richardean Sale M.D.   On: 07/03/2014 08:17   ASSESSMENT AND PLAN: This is a very pleasant 69 years old white female recently diagnosed with:  1) Stage IV non-small cell lung cancer, squamous cell carcinoma completed systemic chemotherapy with carboplatin and Abraxane status post 6 cycles.  She is currently on treatment with immunotherapy according to the BMS checkmate 370 clinical trial with Nivolumab.she status post 5 cycles and tolerating her treatment fairly well. The recent CT scan of the Chest, Abdomen and pelvis showed stable disease in the measurable lesion but there was interval development of small subcarinal, paraesophageal and left hilar adenopathy consistent with metastatic lung cancer. There was also new subpleural nodules in the left lung that may reflect metastases as well. The patient is asymptomatic. I had a lengthy discussion with the patient and her friend today about her scan results and showed them the images. This is most likely to disease progression but I cannot completely rule out  pseudo-progression at this point. I discussed with the patient the option of continuing treatment after disease progression and repeat CT scan in 8 weeks versus discontinuing her treatment with immunotherapy and switching to chemotherapy like docetaxel and Cyramza or single agent gemcitabine. After lengthy discussion of all the option, the patient would like to continue on the immunotherapy for few more cycles until the next the scan. She understands the possibility of continuous disease progression and she is agreeable to proceed with the treatment. She would come back for follow-up visit in 2 weeks for reevaluation before starting cycle #6. For the peripheral neuropathy, the patient will continue her current treatment with Neurontin.  The patient was advised to call immediately if she has any concerning symptoms in the interval.  The patient voices understanding of current disease status and treatment options and is in agreement with the current care plan.  All questions were answered. The patient knows to call the clinic with any problems, questions or concerns. We can certainly see the patient much sooner if necessary.  Disclaimer: This note was dictated with voice recognition software. Similar sounding words can inadvertently be transcribed and may not be corrected upon review.

## 2014-07-05 ENCOUNTER — Encounter: Payer: Self-pay | Admitting: Internal Medicine

## 2014-07-10 ENCOUNTER — Telehealth: Payer: Self-pay | Admitting: *Deleted

## 2014-07-10 NOTE — Telephone Encounter (Signed)
VM message from pt @ 1023 am.stating that she wants to report that she had 2 episodes of diarrhea yesterday. She feels ok but wanted to report this as she is on clinical trial-Opdivo.

## 2014-07-11 ENCOUNTER — Telehealth: Payer: Self-pay | Admitting: *Deleted

## 2014-07-11 NOTE — Telephone Encounter (Signed)
Patient states that she has recently started to have diarrhea. Stools have increased to 4x/day from 2x/day. Instructed patient to take imodium as directed and if symptoms do not resolve then call into the [facility] for further care. Patient verbalized understanding.

## 2014-07-14 ENCOUNTER — Ambulatory Visit (HOSPITAL_BASED_OUTPATIENT_CLINIC_OR_DEPARTMENT_OTHER): Payer: Medicare Other

## 2014-07-14 ENCOUNTER — Other Ambulatory Visit: Payer: Self-pay | Admitting: Medical Oncology

## 2014-07-14 ENCOUNTER — Telehealth: Payer: Self-pay | Admitting: *Deleted

## 2014-07-14 ENCOUNTER — Telehealth: Payer: Self-pay | Admitting: Nurse Practitioner

## 2014-07-14 ENCOUNTER — Encounter: Payer: Self-pay | Admitting: Medical Oncology

## 2014-07-14 ENCOUNTER — Other Ambulatory Visit (HOSPITAL_BASED_OUTPATIENT_CLINIC_OR_DEPARTMENT_OTHER): Payer: Medicare Other

## 2014-07-14 ENCOUNTER — Ambulatory Visit (HOSPITAL_BASED_OUTPATIENT_CLINIC_OR_DEPARTMENT_OTHER): Payer: Medicare Other | Admitting: Nurse Practitioner

## 2014-07-14 ENCOUNTER — Encounter: Payer: Self-pay | Admitting: Nurse Practitioner

## 2014-07-14 VITALS — BP 109/67 | HR 76 | Temp 97.8°F | Resp 16

## 2014-07-14 VITALS — Wt 166.8 lb

## 2014-07-14 DIAGNOSIS — R197 Diarrhea, unspecified: Secondary | ICD-10-CM

## 2014-07-14 DIAGNOSIS — E860 Dehydration: Secondary | ICD-10-CM | POA: Insufficient documentation

## 2014-07-14 DIAGNOSIS — C3412 Malignant neoplasm of upper lobe, left bronchus or lung: Secondary | ICD-10-CM

## 2014-07-14 DIAGNOSIS — C3492 Malignant neoplasm of unspecified part of left bronchus or lung: Secondary | ICD-10-CM

## 2014-07-14 LAB — COMPREHENSIVE METABOLIC PANEL (CC13)
ALT: 18 U/L (ref 0–55)
AST: 18 U/L (ref 5–34)
Albumin: 3.6 g/dL (ref 3.5–5.0)
Alkaline Phosphatase: 64 U/L (ref 40–150)
Anion Gap: 10 mEq/L (ref 3–11)
BILIRUBIN TOTAL: 0.4 mg/dL (ref 0.20–1.20)
BUN: 18.8 mg/dL (ref 7.0–26.0)
CO2: 28 mEq/L (ref 22–29)
Calcium: 9.4 mg/dL (ref 8.4–10.4)
Chloride: 101 mEq/L (ref 98–109)
Creatinine: 1 mg/dL (ref 0.6–1.1)
EGFR: 55 mL/min/{1.73_m2} — AB (ref >90)
GLUCOSE: 92 mg/dL (ref 70–140)
Potassium: 3.9 mEq/L (ref 3.5–5.1)
SODIUM: 139 meq/L (ref 136–145)
Total Protein: 7.2 g/dL (ref 6.4–8.3)

## 2014-07-14 LAB — CBC WITH DIFFERENTIAL/PLATELET
BASO%: 0.4 % (ref 0.0–2.0)
BASOS ABS: 0 10*3/uL (ref 0.0–0.1)
EOS ABS: 0.1 10*3/uL (ref 0.0–0.5)
EOS%: 1 % (ref 0.0–7.0)
HCT: 35.3 % (ref 34.8–46.6)
HGB: 11.5 g/dL — ABNORMAL LOW (ref 11.6–15.9)
LYMPH%: 23.7 % (ref 14.0–49.7)
MCH: 31.3 pg (ref 25.1–34.0)
MCHC: 32.6 g/dL (ref 31.5–36.0)
MCV: 96.1 fL (ref 79.5–101.0)
MONO#: 0.7 10*3/uL (ref 0.1–0.9)
MONO%: 12.5 % (ref 0.0–14.0)
NEUT#: 3.5 10*3/uL (ref 1.5–6.5)
NEUT%: 62.4 % (ref 38.4–76.8)
PLATELETS: 264 10*3/uL (ref 145–400)
RBC: 3.68 10*6/uL — ABNORMAL LOW (ref 3.70–5.45)
RDW: 15.6 % — ABNORMAL HIGH (ref 11.2–14.5)
WBC: 5.7 10*3/uL (ref 3.9–10.3)
lymph#: 1.3 10*3/uL (ref 0.9–3.3)

## 2014-07-14 MED ORDER — DIPHENOXYLATE-ATROPINE 2.5-0.025 MG PO TABS: 2.0000 | ORAL_TABLET | Freq: Four times a day (QID) | ORAL | Status: DC | PRN

## 2014-07-14 MED ORDER — SODIUM CHLORIDE 0.9 % IV SOLN
Freq: Once | INTRAVENOUS | Status: AC
  Administered 2014-07-14: 16:00:00 via INTRAVENOUS

## 2014-07-14 MED ORDER — SODIUM CHLORIDE 0.9 % IV SOLN
Freq: Once | INTRAVENOUS | Status: AC
  Administered 2014-07-14: 16:00:00 via INTRAVENOUS
  Filled 2014-07-14: qty 4

## 2014-07-14 MED ORDER — HEPARIN SOD (PORK) LOCK FLUSH 100 UNIT/ML IV SOLN
500.0000 [IU] | Freq: Once | INTRAVENOUS | Status: AC
  Administered 2014-07-14: 500 [IU] via INTRAVENOUS
  Filled 2014-07-14: qty 5

## 2014-07-14 MED ORDER — SODIUM CHLORIDE 0.9 % IJ SOLN
10.0000 mL | INTRAMUSCULAR | Status: DC | PRN
  Administered 2014-07-14: 10 mL via INTRAVENOUS
  Filled 2014-07-14: qty 10

## 2014-07-14 NOTE — Telephone Encounter (Signed)
Pt complains of diahrrea since 6/2 took immodium 2 pills each morning and 1 pill after each stool x 2 loose stools.  Pt advised she has done this Fri, Sat.  & Sun, and does not know what else to do. Discussed with pt she may need to be seen by symptom management, pt requested later afternoon appt.

## 2014-07-14 NOTE — Telephone Encounter (Signed)
per pof pt already sch for next appt-appt sch

## 2014-07-14 NOTE — Assessment & Plan Note (Signed)
Patient reports chronic diarrhea up to 4 stools per day since this past Thursday, 07/10/2014.  Patient has been taken up to 4 Imodium a day with only minimal effectiveness.  Patient also states she had one episode of nausea/vomiting; but has had no further vomiting.  Patient also denies any other new symptoms whatsoever.  She denies any recent fevers or chills.  Patient was given a prescription for Lomotil to try for treatment of diarrhea.  She was advised to alternate both the Imodium in the Lomotil on an every 4-6 hour basis if needed for diarrhea.

## 2014-07-14 NOTE — Patient Instructions (Signed)

## 2014-07-14 NOTE — Progress Notes (Signed)
SYMPTOM MANAGEMENT CLINIC   HPI: Tammie Clay 69 y.o. female diagnosed with lung cancer.  Patient last received the trial of Nivolumab immunotherapy as per research protocol BMS 370, group B, arm A on 07/02/2014.  Patient reports chronic diarrhea up to 4 stools per day since this past Thursday, 07/10/2014.  Patient has been taken up to 4 Imodium a day with only minimal effectiveness.  Patient also states she had one episode of nausea/vomiting; but has had no further vomiting.  Patient also denies any other new symptoms whatsoever.  She denies any recent fevers or chills.  Patient was given a prescription for Lomotil to try for treatment of diarrhea.  She was advised to alternate both the Imodium in the Lomotil on an every 4-6 hour basis if needed for diarrhea.   HPI  ROS  Past Medical History  Diagnosis Date  . Hypertension   . COPD (chronic obstructive pulmonary disease)   . Hyperlipidemia   . GERD (gastroesophageal reflux disease)   . Osteopenia   . Insomnia   . Mediastinal lymphadenopathy 11/19/13    PER CT  . Multiple lung nodules on CT 11/19/13    LEFT LUNG BASE  . Lung mass 11/19/13    RUL PER CT  . Coronary artery calcification seen on CAT scan 11/21/2013  . COPD (chronic obstructive pulmonary disease) 11/21/2013  . Pneumonia     hx of  . Anxiety     Past Surgical History  Procedure Laterality Date  .  c section x 2    . Urethral cyst removed    . Tonsillectomy    . Appendectomy    . Colonoscopy with propofol N/A 07/17/2012    Procedure: COLONOSCOPY WITH PROPOFOL;  Surgeon: Garlan Fair, MD;  Location: [facility];  Service: Endoscopy;  Laterality: N/A;  . Esophagogastroduodenoscopy (egd) with propofol N/A 07/17/2012    Procedure: ESOPHAGOGASTRODUODENOSCOPY (EGD) WITH PROPOFOL;  Surgeon: Garlan Fair, MD;  Location: [facility];  Service: Endoscopy;  Laterality: N/A;  . Video bronchoscopy with endobronchial ultrasound N/A 12/03/2013   Procedure: VIDEO BRONCHOSCOPY WITH ENDOBRONCHIAL ULTRASOUND WITH BIOPSIES;  Surgeon: Grace Isaac, MD;  Location: [facility];  Service: Thoracic;  Laterality: N/A;  . Portacath placement Left 12/10/2013    Procedure: INSERTION PORT-A-CATH;  Surgeon: Grace Isaac, MD;  Location: [facility];  Service: Thoracic;  Laterality: Left;    has Hyperlipidemia; GERD (gastroesophageal reflux disease); Osteopenia; Left Upper Lobe mass, 4cm; Mediastinal lymphadenopathy; Coronary artery calcification seen on CAT scan; COPD (chronic obstructive pulmonary disease); Stage IV squamous cell carcinoma of left lung; Neoplasm related pain; Rectal bleeding; Chemotherapy induced neutropenia; Weakness; Insomnia; Bronchitis; Antineoplastic chemotherapy induced anemia; Constipation; Chemotherapy-induced neuropathy; Diarrhea; and Dehydration on her problem list.    has No Known Allergies.    Medication List       This list is accurate as of: 07/14/14  4:25 PM.  Always use your most recent med list.               acetaminophen 325 MG tablet  Commonly known as:  TYLENOL  Take 650 mg by mouth. Take 2 tablets every morning     albuterol 108 (90 BASE) MCG/ACT inhaler  Commonly known as:  PROVENTIL HFA;VENTOLIN HFA  Inhale 2 puffs into the lungs every 6 (six) hours as needed for wheezing.     calcium carbonate 500 MG chewable tablet  Commonly known as:  TUMS - dosed in mg elemental calcium  Chew 1 tablet  by mouth daily.     diazepam 5 MG tablet  Commonly known as:  VALIUM  Take 5 mg by mouth at bedtime.     diphenoxylate-atropine 2.5-0.025 MG per tablet  Commonly known as:  LOMOTIL  Take 2 tablets by mouth 4 (four) times daily as needed for diarrhea or loose stools.     docusate sodium 100 MG capsule  Commonly known as:  COLACE  Take 100 mg by mouth 2 (two) times daily.     DULoxetine 30 MG capsule  Commonly known as:  CYMBALTA  TAKE 1 CAPSULE (30 MG TOTAL) BY MOUTH DAILY.     gabapentin 100 MG capsule    Commonly known as:  NEURONTIN  Take 1 capsule (100 mg total) by mouth 3 (three) times daily.     HYDROcodone-acetaminophen 10-325 MG per tablet  Commonly known as:  NORCO  Take 1 tablet by mouth every 6 (six) hours as needed.     lidocaine-prilocaine cream  Commonly known as:  EMLA  Apply 1 application topically as needed.     lisinopril-hydrochlorothiazide 20-12.5 MG per tablet  Commonly known as:  PRINZIDE,ZESTORETIC  Take 2 tablets by mouth every morning.     loratadine 10 MG tablet  Commonly known as:  CLARITIN  Take 10 mg by mouth daily.     magnesium oxide 400 (241.3 MG) MG tablet  Commonly known as:  MAG-OX  Take 1 tablet (400 mg total) by mouth 2 (two) times daily.     metoprolol succinate 50 MG 24 hr tablet  Commonly known as:  TOPROL-XL  Take 50 mg by mouth every morning. Take with or immediately following a meal.     multivitamin with minerals Tabs tablet  Take 1 tablet by mouth daily.     polyethylene glycol packet  Commonly known as:  MIRALAX / GLYCOLAX  Take 17 g by mouth daily as needed.     prochlorperazine 10 MG tablet  Commonly known as:  COMPAZINE  Take 1 tablet (10 mg total) by mouth every 6 (six) hours as needed for nausea or vomiting.     ranitidine 150 MG tablet  Commonly known as:  ZANTAC  Take 150 mg by mouth 2 (two) times daily.     SYMBICORT 80-4.5 MCG/ACT inhaler  Generic drug:  budesonide-formoterol         PHYSICAL EXAMINATION  Oncology Vitals 07/14/2014 07/03/2014 07/02/2014 06/18/2014 06/04/2014 05/21/2014 05/09/2014  Height - 158 cm 158 cm 158 cm 158 cm 158 cm 158 cm  Weight 75.66 kg 76.839 kg 77.248 kg 76.885 kg 77.474 kg 79.47 kg 79.243 kg  Weight (lbs) 166 lbs 13 oz 169 lbs 6 oz 170 lbs 5 oz 169 lbs 8 oz 170 lbs 13 oz 175 lbs 3 oz 174 lbs 11 oz  BMI (kg/m2) - 30.98 kg/m2 31.15 kg/m2 31 kg/m2 31.24 kg/m2 32.04 kg/m2 31.95 kg/m2  Temp - 98.1 97.9 98.3 98.1 98 98.1  Pulse - 104 89 94 90 81 106  Resp - 18 19 19 18 16 18   SpO2 - 98 100  99 100 99 100  BSA (m2) - 1.83 m2 1.84 m2 1.83 m2 1.84 m2 1.86 m2 1.86 m2   BP Readings from Last 3 Encounters:  07/03/14 135/74  07/02/14 133/76  06/18/14 122/67    Physical Exam  Constitutional: She is oriented to person, place, and time and well-developed, well-nourished, and in no distress.  HENT:  Head: Normocephalic and atraumatic.  Eyes: Conjunctivae and EOM are  normal. Pupils are equal, round, and reactive to light. Right eye exhibits no discharge. Left eye exhibits no discharge. No scleral icterus.  Neck: Normal range of motion.  Pulmonary/Chest: Effort normal. No respiratory distress.  Musculoskeletal: Normal range of motion.  Neurological: She is alert and oriented to person, place, and time.  Skin: Skin is warm and dry.  Psychiatric: Affect normal.  Nursing note and vitals reviewed.   LABORATORY DATA:. Appointment on 07/14/2014  Component Date Value Ref Range Status  . WBC 07/14/2014 5.7  3.9 - 10.3 10e3/uL Final  . NEUT# 07/14/2014 3.5  1.5 - 6.5 10e3/uL Final  . HGB 07/14/2014 11.5* 11.6 - 15.9 g/dL Final  . HCT 07/14/2014 35.3  34.8 - 46.6 % Final  . Platelets 07/14/2014 264  145 - 400 10e3/uL Final  . MCV 07/14/2014 96.1  79.5 - 101.0 fL Final  . MCH 07/14/2014 31.3  25.1 - 34.0 pg Final  . MCHC 07/14/2014 32.6  31.5 - 36.0 g/dL Final  . RBC 07/14/2014 3.68* 3.70 - 5.45 10e6/uL Final  . RDW 07/14/2014 15.6* 11.2 - 14.5 % Final  . lymph# 07/14/2014 1.3  0.9 - 3.3 10e3/uL Final  . MONO# 07/14/2014 0.7  0.1 - 0.9 10e3/uL Final  . Eosinophils Absolute 07/14/2014 0.1  0.0 - 0.5 10e3/uL Final  . Basophils Absolute 07/14/2014 0.0  0.0 - 0.1 10e3/uL Final  . NEUT% 07/14/2014 62.4  38.4 - 76.8 % Final  . LYMPH% 07/14/2014 23.7  14.0 - 49.7 % Final  . MONO% 07/14/2014 12.5  0.0 - 14.0 % Final  . EOS% 07/14/2014 1.0  0.0 - 7.0 % Final  . BASO% 07/14/2014 0.4  0.0 - 2.0 % Final  . Sodium 07/14/2014 139  136 - 145 mEq/L Final  . Potassium 07/14/2014 3.9  3.5 - 5.1  mEq/L Final  . Chloride 07/14/2014 101  98 - 109 mEq/L Final  . CO2 07/14/2014 28  22 - 29 mEq/L Final  . Glucose 07/14/2014 92  70 - 140 mg/dl Final  . BUN 07/14/2014 18.8  7.0 - 26.0 mg/dL Final  . Creatinine 07/14/2014 1.0  0.6 - 1.1 mg/dL Final  . Total Bilirubin 07/14/2014 0.40  0.20 - 1.20 mg/dL Final  . Alkaline Phosphatase 07/14/2014 64  40 - 150 U/L Final  . AST 07/14/2014 18  5 - 34 U/L Final  . ALT 07/14/2014 18  0 - 55 U/L Final  . Total Protein 07/14/2014 7.2  6.4 - 8.3 g/dL Final  . Albumin 07/14/2014 3.6  3.5 - 5.0 g/dL Final  . Calcium 07/14/2014 9.4  8.4 - 10.4 mg/dL Final  . Anion Gap 07/14/2014 10  3 - 11 mEq/L Final  . EGFR 07/14/2014 55* >90 ml/min/1.73 m2 Final   eGFR is calculated using the CKD-EPI Creatinine Equation (2009)     RADIOGRAPHIC STUDIES: No results found.  ASSESSMENT/PLAN:    Stage IV squamous cell carcinoma of left lung Patient last received the trial of Nivolumab immunotherapy as per research protocol BMS 370, group B, arm A on 07/02/2014.  Patient is scheduled to return for labs, follow up visit, and the next cycle of immunotherapy on 07/16/2014.   Diarrhea Patient reports chronic diarrhea up to 4 stools per day since this past Thursday, 07/10/2014.  Patient has been taken up to 4 Imodium a day with only minimal effectiveness.  Patient also states she had one episode of nausea/vomiting; but has had no further vomiting.  Patient also denies any other new symptoms  whatsoever.  She denies any recent fevers or chills.  Patient was given a prescription for Lomotil to try for treatment of diarrhea.  She was advised to alternate both the Imodium in the Lomotil on an every 4-6 hour basis if needed for diarrhea.   Dehydration Patient has been experiencing intermittent diarrhea since this past Thursday, chin second 2016.  She feels slightly dehydrated today; and will receive 1 L normal saline IV fluid rehydration while [facility].  Chills.  Also  encouraged to push fluids at home   Patient stated understanding of all instructions; and was in agreement with this plan of care. The patient knows to call the clinic with any problems, questions or concerns.   Review/collaboration with Dr. [name] regarding all aspects of patient's visit today.   Total time spent with patient was 25 minutes;  with greater than 75 percent of that time spent in face to face counseling regarding patient's symptoms,  and coordination of care and follow up.  Disclaimer: This note was dictated with voice recognition software. Similar sounding words can inadvertently be transcribed and may not be corrected upon review.   Drue Second, NP 07/14/2014

## 2014-07-14 NOTE — Assessment & Plan Note (Signed)
Patient has been experiencing intermittent diarrhea since this past Thursday, chin second 2016.  She feels slightly dehydrated today; and will receive 1 L normal saline IV fluid rehydration while cancer Center.  Chills.  Also encouraged to push fluids at home

## 2014-07-14 NOTE — Assessment & Plan Note (Signed)
Patient last received the trial of Nivolumab immunotherapy as per research protocol BMS 370, group B, arm A on 07/02/2014.  Patient is scheduled to return for labs, follow up visit, and the next cycle of immunotherapy on 07/16/2014.

## 2014-07-14 NOTE — Telephone Encounter (Signed)
Pt seeing NP/CB in infusion room after labs per 06/06 POF, pt is aware... KJ

## 2014-07-14 NOTE — Progress Notes (Signed)
BMS 370: GROUP B, ARM A Patient here today d/t diarrhea. Met with patient today in treatment room for unscheduled visit. Patient reports having diarrhea since Thursday (06/02) evening after dinner with one episode of vomiting that same evening. Patient reports that she has had no nausea or vomiting since that one episode. Patient states she has had diarrhea daily for five days now with no relief with imodium. Patient called triage 06/2 reporting two episodes of diarrhea, called triage 06/03 again to report diarrhea had increased to 4x/day and was instructed at that time to take imodium. Patient states diarrhea continued through Saturday and Sunday with approximately up to 4 episodes each day since 06/03 and she called this morning to report having no relief. Patient was given appointment this afternoon with symptom management clinic NP,  Selena Lesser and NP ordered for IVF and antiemetics to be given to patient today. I spoke with patient during her IVF treatment, patient states she had some initial mild abdominal cramping on 06/02 and reports none at this time. Patient denies; fever, nausea, vomiting, SOB, pain, blood in stool, dysphagia or any other symptoms. Patient does confirm decrease in appetite.  Patient to be seen today by Selena Lesser, NP. Patient's labs resulted within her normal. I encouraged patient to call clinic should symptoms worsen, patient gave verbal understanding. Patient scheduled for cycle 6 and to see Darrol Jump, PA prior to cycle 6 treatment on June 8th, along with lab appt. Patient denied questions at this time and knows to call clinic with any questions/concerns. [name], RN, Clinical Research 07/14/2014 4:49 PM

## 2014-07-14 NOTE — Telephone Encounter (Signed)
Reviewed with Cyndee, NP, pof to scheduling for Lab CBC/CMET and IVF, NP to see pt in infusion room. Notified pt who verbalized she will be here at 230pm for appts

## 2014-07-15 ENCOUNTER — Telehealth: Payer: Self-pay

## 2014-07-15 NOTE — Telephone Encounter (Signed)
Called to follow up with pt after [facility] visit yesterday 07/14/14. Pt states she feels a lot better and had a small regular bowel movement this morning.States she is eating and drinking well today. Verbalized understanding of appts tomorrow 07/16/14. Pt denies any other questions or concerns at this time.

## 2014-07-16 ENCOUNTER — Ambulatory Visit (HOSPITAL_BASED_OUTPATIENT_CLINIC_OR_DEPARTMENT_OTHER): Payer: Medicare Other | Admitting: Lab

## 2014-07-16 ENCOUNTER — Encounter: Payer: Self-pay | Admitting: Physician Assistant

## 2014-07-16 ENCOUNTER — Encounter: Payer: Self-pay | Admitting: Medical Oncology

## 2014-07-16 ENCOUNTER — Encounter: Payer: Self-pay | Admitting: Internal Medicine

## 2014-07-16 ENCOUNTER — Other Ambulatory Visit (HOSPITAL_COMMUNITY)
Admission: RE | Admit: 2014-07-16 | Discharge: 2014-07-16 | Disposition: A | Discharge status: Home / Self Care | Payer: Medicare Other | Source: Ambulatory Visit | Attending: Internal Medicine | Admitting: Internal Medicine

## 2014-07-16 ENCOUNTER — Telehealth: Payer: Self-pay | Admitting: Internal Medicine

## 2014-07-16 ENCOUNTER — Ambulatory Visit (HOSPITAL_BASED_OUTPATIENT_CLINIC_OR_DEPARTMENT_OTHER): Payer: Medicare Other | Admitting: Physician Assistant

## 2014-07-16 ENCOUNTER — Ambulatory Visit (HOSPITAL_BASED_OUTPATIENT_CLINIC_OR_DEPARTMENT_OTHER): Payer: Medicare Other

## 2014-07-16 VITALS — BP 130/77 | HR 94 | Temp 98.3°F | Resp 18 | Ht 62.0 in | Wt 166.8 lb

## 2014-07-16 DIAGNOSIS — C3492 Malignant neoplasm of unspecified part of left bronchus or lung: Secondary | ICD-10-CM

## 2014-07-16 DIAGNOSIS — C3412 Malignant neoplasm of upper lobe, left bronchus or lung: Secondary | ICD-10-CM

## 2014-07-16 DIAGNOSIS — Z87891 Personal history of nicotine dependence: Secondary | ICD-10-CM | POA: Insufficient documentation

## 2014-07-16 DIAGNOSIS — Z5112 Encounter for antineoplastic immunotherapy: Secondary | ICD-10-CM | POA: Diagnosis not present

## 2014-07-16 DIAGNOSIS — Z006 Encounter for examination for normal comparison and control in clinical research program: Secondary | ICD-10-CM

## 2014-07-16 LAB — CBC WITH DIFFERENTIAL/PLATELET
BASO%: 0.7 % (ref 0.0–2.0)
Basophils Absolute: 0 10*3/uL (ref 0.0–0.1)
EOS%: 1.5 % (ref 0.0–7.0)
Eosinophils Absolute: 0.1 10*3/uL (ref 0.0–0.5)
HCT: 34 % — ABNORMAL LOW (ref 34.8–46.6)
HGB: 11.2 g/dL — ABNORMAL LOW (ref 11.6–15.9)
LYMPH#: 1.2 10*3/uL (ref 0.9–3.3)
LYMPH%: 19.8 % (ref 14.0–49.7)
MCH: 31.2 pg (ref 25.1–34.0)
MCHC: 33 g/dL (ref 31.5–36.0)
MCV: 94.7 fL (ref 79.5–101.0)
MONO#: 0.6 10*3/uL (ref 0.1–0.9)
MONO%: 9.9 % (ref 0.0–14.0)
NEUT#: 4 10*3/uL (ref 1.5–6.5)
NEUT%: 68.1 % (ref 38.4–76.8)
Platelets: 254 10*3/uL (ref 145–400)
RBC: 3.58 10*6/uL — AB (ref 3.70–5.45)
RDW: 15.2 % — ABNORMAL HIGH (ref 11.2–14.5)
WBC: 5.8 10*3/uL (ref 3.9–10.3)

## 2014-07-16 LAB — COMPREHENSIVE METABOLIC PANEL (CC13)
ALT: 15 U/L (ref 0–55)
AST: 18 U/L (ref 5–34)
Albumin: 3.4 g/dL — ABNORMAL LOW (ref 3.5–5.0)
Alkaline Phosphatase: 62 U/L (ref 40–150)
Anion Gap: 12 mEq/L — ABNORMAL HIGH (ref 3–11)
BILIRUBIN TOTAL: 0.35 mg/dL (ref 0.20–1.20)
BUN: 18.9 mg/dL (ref 7.0–26.0)
CHLORIDE: 103 meq/L (ref 98–109)
CO2: 27 meq/L (ref 22–29)
Calcium: 9.3 mg/dL (ref 8.4–10.4)
Creatinine: 1 mg/dL (ref 0.6–1.1)
EGFR: 58 mL/min/{1.73_m2} — ABNORMAL LOW (ref >90)
Glucose: 108 mg/dl (ref 70–140)
Potassium: 3.6 mEq/L (ref 3.5–5.1)
Sodium: 142 mEq/L (ref 136–145)
TOTAL PROTEIN: 6.9 g/dL (ref 6.4–8.3)

## 2014-07-16 LAB — PHOSPHORUS: Phosphorus: 3.2 mg/dL (ref 2.5–4.6)

## 2014-07-16 LAB — MAGNESIUM (CC13): Magnesium: 1.5 mg/dl (ref 1.5–2.5)

## 2014-07-16 LAB — LACTATE DEHYDROGENASE (CC13): LDH: 177 U/L (ref 125–245)

## 2014-07-16 MED ORDER — SODIUM CHLORIDE 0.9 % IJ SOLN
10.0000 mL | INTRAMUSCULAR | Status: DC | PRN
  Administered 2014-07-16: 10 mL
  Filled 2014-07-16: qty 10

## 2014-07-16 MED ORDER — GABAPENTIN 300 MG PO CAPS: 300.0000 mg | ORAL_CAPSULE | Freq: Three times a day (TID) | ORAL | Status: DC

## 2014-07-16 MED ORDER — SODIUM CHLORIDE 0.9 % IV SOLN
Freq: Once | INTRAVENOUS | Status: AC
  Administered 2014-07-16: 10:00:00 via INTRAVENOUS

## 2014-07-16 MED ORDER — HEPARIN SOD (PORK) LOCK FLUSH 100 UNIT/ML IV SOLN
500.0000 [IU] | Freq: Once | INTRAVENOUS | Status: AC | PRN
  Administered 2014-07-16: 500 [IU]
  Filled 2014-07-16: qty 5

## 2014-07-16 MED ORDER — SODIUM CHLORIDE 0.9 % IV SOLN
240.0000 mg | Freq: Once | INTRAVENOUS | Status: AC
  Administered 2014-07-16: 240 mg via INTRAVENOUS
  Filled 2014-07-16: qty 24

## 2014-07-16 NOTE — Progress Notes (Signed)
[facility] Telephone:([phone]) [phone]   Fax:(336) [place] [address] [employer] [address] [place] [address]  DIAGNOSIS: Stage IV (T2, N2, M1a) non-small cell lung cancer, squamous cell carcinoma presented with central left upper lobe lung mass with associated postobstructive atelectasis as well as ipsilateral and subcarinal lymphadenopathy as well as pleural tumor spread diagnosed in October 2015.  PRIOR THERAPY: Systemic chemotherapy with carboplatin for AUC of 5 on day 1 and Abraxane 100 MG/M2 on days 1, 8 and 15 every 3 weeks. Status post 6 cycles.   CURRENT THERAPY: Nivolumab per research protocol BMS 370 group B, arm A, given every 2 weeks. Status post 5 cycles  INTERVAL HISTORY: Tammie Clay 69 y.o. female returns to the clinic today for follow-up visit accompanied by her significant other. She is currently undergoing treatment with immunotherapy with Nivolumab 3 MG/KG every 2 weeks is status post 5 cycles. She did have some issues with diarrhea however they have resolved Sunday. She reports that she did not require Lomotil. She continues to have peripheral neuropathy symptoms in her feet. She states that when she will wears open toed shoes it feels like she has and in her feet and when she wears closed toe shoes it feels like she has a skin in her feet. She is currently taking gabapentin 200 mg by mouth 3 times daily and does not find this to be helpful.  She denied having any significant fever or chills. The patient denied having any significant chest pain, shortness of breath, cough or hemoptysis. She has no nausea or vomiting. She denied having any significant weight loss or night sweats. She had repeat CT scan of the chest, abdomen and pelvis performed earlier today and she is here for evaluation and discussion of her scan results.  MEDICAL HISTORY: Past Medical History  Diagnosis Date  . Hypertension   . COPD  (chronic obstructive pulmonary disease)   . Hyperlipidemia   . GERD (gastroesophageal reflux disease)   . Osteopenia   . Insomnia   . Mediastinal lymphadenopathy 11/19/13    PER CT  . Multiple lung nodules on CT 11/19/13    LEFT LUNG BASE  . Lung mass 11/19/13    RUL PER CT  . Coronary artery calcification seen on CAT scan 11/21/2013  . COPD (chronic obstructive pulmonary disease) 11/21/2013  . Pneumonia     hx of  . Anxiety     ALLERGIES:  has No Known Allergies.  MEDICATIONS:  Current Outpatient Prescriptions  Medication Sig Dispense Refill  . acetaminophen (TYLENOL) 325 MG tablet Take 650 mg by mouth. Take 2 tablets every morning    . albuterol (PROVENTIL HFA;VENTOLIN HFA) 108 (90 BASE) MCG/ACT inhaler Inhale 2 puffs into the lungs every 6 (six) hours as needed for wheezing.    . calcium carbonate (TUMS - DOSED IN MG ELEMENTAL CALCIUM) 500 MG chewable tablet Chew 1 tablet by mouth daily.    . diazepam (VALIUM) 5 MG tablet Take 5 mg by mouth at bedtime.    . DULoxetine (CYMBALTA) 30 MG capsule TAKE 1 CAPSULE (30 MG TOTAL) BY MOUTH DAILY. 30 capsule 2  . HYDROcodone-acetaminophen (NORCO) 10-325 MG per tablet Take 1 tablet by mouth every 6 (six) hours as needed.    . lidocaine-prilocaine (EMLA) cream Apply 1 application topically as needed. 30 g 0  . lisinopril-hydrochlorothiazide (PRINZIDE,ZESTORETIC) 20-12.5 MG per tablet Take 2 tablets by mouth every morning.     Marland Kitchen  loratadine (CLARITIN) 10 MG tablet Take 10 mg by mouth daily.    . magnesium oxide (MAG-OX) 400 (241.3 MG) MG tablet Take 1 tablet (400 mg total) by mouth 2 (two) times daily. 60 tablet 0  . metoprolol succinate (TOPROL-XL) 50 MG 24 hr tablet Take 50 mg by mouth every morning. Take with or immediately following a meal.    . Multiple Vitamin (MULTIVITAMIN WITH MINERALS) TABS Take 1 tablet by mouth daily.    . prochlorperazine (COMPAZINE) 10 MG tablet Take 1 tablet (10 mg total) by mouth every 6 (six) hours as needed  for nausea or vomiting. 30 tablet 1  . ranitidine (ZANTAC) 150 MG tablet Take 150 mg by mouth 2 (two) times daily.    . SYMBICORT 80-4.5 MCG/ACT inhaler   3  . diphenoxylate-atropine (LOMOTIL) 2.5-0.025 MG per tablet Take 2 tablets by mouth 4 (four) times daily as needed for diarrhea or loose stools. (Patient not taking: Reported on 07/16/2014) 30 tablet 0  . docusate sodium (COLACE) 100 MG capsule Take 100 mg by mouth 2 (two) times daily.    Marland Kitchen gabapentin (NEURONTIN) 300 MG capsule Take 1 capsule (300 mg total) by mouth 3 (three) times daily. 90 capsule 2  . polyethylene glycol (MIRALAX / GLYCOLAX) packet Take 17 g by mouth daily as needed.     No current facility-administered medications for this visit.   Facility-Administered Medications Ordered in Other Visits  Medication Dose Route Frequency Provider Last Rate Last Dose  . sodium chloride 0.9 % injection 10 mL  10 mL Intracatheter PRN Curt Bears, MD   10 mL at 07/16/14 1123    SURGICAL HISTORY:  Past Surgical History  Procedure Laterality Date  .  c section x 2    . Urethral cyst removed    . Tonsillectomy    . Appendectomy    . Colonoscopy with propofol N/A 07/17/2012    Procedure: COLONOSCOPY WITH PROPOFOL;  Surgeon: Garlan Fair, MD;  Location: [facility];  Service: Endoscopy;  Laterality: N/A;  . Esophagogastroduodenoscopy (egd) with propofol N/A 07/17/2012    Procedure: ESOPHAGOGASTRODUODENOSCOPY (EGD) WITH PROPOFOL;  Surgeon: Garlan Fair, MD;  Location: [facility];  Service: Endoscopy;  Laterality: N/A;  . Video bronchoscopy with endobronchial ultrasound N/A 12/03/2013    Procedure: VIDEO BRONCHOSCOPY WITH ENDOBRONCHIAL ULTRASOUND WITH BIOPSIES;  Surgeon: Grace Isaac, MD;  Location: [facility];  Service: Thoracic;  Laterality: N/A;  . Portacath placement Left 12/10/2013    Procedure: INSERTION PORT-A-CATH;  Surgeon: Grace Isaac, MD;  Location: [facility];  Service: Thoracic;  Laterality: Left;    REVIEW OF  SYSTEMS:  A comprehensive review of systems was negative except for: Gastrointestinal: positive for diarrhea and that has resolved Neurological: positive for paresthesia   PHYSICAL EXAMINATION: General appearance: alert, cooperative, fatigued and no distress Head: Normocephalic, without obvious abnormality, atraumatic Neck: no adenopathy, no JVD, supple, symmetrical, trachea midline and thyroid not enlarged, symmetric, no tenderness/mass/nodules Lymph nodes: Cervical, supraclavicular, and axillary nodes normal. Resp: clear to auscultation bilaterally Back: symmetric, no curvature. ROM normal. No CVA tenderness. Cardio: regular rate and rhythm, S1, S2 normal, no murmur, click, rub or gallop GI: soft, non-tender; bowel sounds normal; no masses,  no organomegaly Genitalia: defer exam Extremities: extremities normal, atraumatic, no cyanosis or edema Neurologic: Alert and oriented X 3, normal strength and tone. Normal symmetric reflexes. Normal coordination and gait  ECOG PERFORMANCE STATUS: 1 - Symptomatic but completely ambulatory  Blood pressure 130/77, pulse 94, temperature 98.3  F (36.8 C), temperature source Oral, resp. rate 18, height '5\' 2"'$  (1.575 m), weight 166 lb 12.8 oz (75.66 kg), SpO2 98 %.  LABORATORY DATA: Lab Results  Component Value Date   WBC 5.8 07/16/2014   HGB 11.2* 07/16/2014   HCT 34.0* 07/16/2014   MCV 94.7 07/16/2014   PLT 254 07/16/2014      Chemistry      Component Value Date/Time   NA 142 07/16/2014 0819   NA 138 12/10/2013 0637   K 3.6 07/16/2014 0819   K 4.7 12/10/2013 0637   CL 99 12/10/2013 0637   CO2 27 07/16/2014 0819   CO2 28 12/10/2013 0637   BUN 18.9 07/16/2014 0819   BUN 22 12/10/2013 0637   CREATININE 1.0 07/16/2014 0819   CREATININE 0.75 12/10/2013 0637   CREATININE 0.80 11/22/2013 1054      Component Value Date/Time   CALCIUM 9.3 07/16/2014 0819   CALCIUM 9.8 12/10/2013 0637   ALKPHOS 62 07/16/2014 0819   ALKPHOS 77 12/10/2013 0637     AST 18 07/16/2014 0819   AST 16 12/10/2013 0637   ALT 15 07/16/2014 0819   ALT 16 12/10/2013 0637   BILITOT 0.35 07/16/2014 0819   BILITOT 0.4 12/10/2013 0637       RADIOGRAPHIC STUDIES: No results found. ASSESSMENT AND PLAN: This is a very pleasant 69 years old white female recently diagnosed with:  1) Stage IV non-small cell lung cancer, squamous cell carcinoma completed systemic chemotherapy with carboplatin and Abraxane status post 6 cycles.  She is currently on treatment with immunotherapy according to the BMS checkmate 370 clinical trial with Nivolumab.she status post 5 cycles and tolerating her treatment fairly well. The recent CT scan of the Chest, Abdomen and pelvis showed stable disease in the measurable lesion but there was interval development of small subcarinal, paraesophageal and left hilar adenopathy consistent with metastatic lung cancer. There was also new subpleural nodules in the left lung that may reflect metastases as well. The patient is asymptomatic. The patient was discussed with and also seen by Dr. [name]. Dr. [name] had a lengthy discussion with the patient and her friend today about her scan results and showed them the images. This is most likely to disease progression but we cannot completely rule out pseudo-progression at this point. She is continuing with an additional 4 cycles of immunotherapy to be followed by repeat CT scan for reevaluation. She will proceed with cycle #6 today as scheduled. Regarding her paresthesias, I will increase her gabapentin to 300 mg by mouth 3 times daily and a prescription for this medication was called into her pharmacy of record. She will follow-up in 2 weeks prior to the start of cycle #7. The patient was advised to call immediately if she has any concerning symptoms in the interval.  The patient voices understanding of current disease status and treatment options and is in agreement with the current care plan.  All  questions were answered. The patient knows to call the clinic with any problems, questions or concerns. We can certainly see the patient much sooner if necessary.  Carlton Adam , PA-C 07/16/2014  ADDENDUM: Hematology/Oncology Attending: I had a face to face encounter with the patient today. I recommended her care plan. This is a very pleasant 69 years old white female with a stage IV non-small cell lung cancer, squamous cell carcinoma status post induction chemotherapy with carboplatin and Abraxane and currently undergoing systemic treatment with Nivolumab according to the BMS checkmate  370 clinical trial. She is status post 5 cycles of her treatment and tolerating it fairly well. She had one day of diarrhea which completely resolved spontaneously with no symptomatic treatment and most likely unrelated to her treatment. I recommended for the patient to proceed with cycle #6 today as a scheduled. She would come back for follow-up visit in 2 weeks for reevaluation before starting cycle #7.  The patient was advised to call immediately if she has any concerning symptoms in the interval.  Disclaimer: This note was dictated with voice recognition software. Similar sounding words can inadvertently be transcribed and may be missed upon review. Eilleen Kempf., MD 07/16/2014

## 2014-07-16 NOTE — Progress Notes (Signed)
BMS [id number] - questionnaires (PROs) - patient into the cancer center for routine visit. Patient was given PROs upon arrival to the cancer center. The patient completed her PROs (EQ-[id number] first and then the FACT-L booklet) before any study procedures were performed. I checked the PROs for completeness.  Patient's research blood was drawn 06/18/14.  The patient was thanked for her continued support of this clinical trial. Barb Faisal Stradling 07/16/2014.08:27AM

## 2014-07-16 NOTE — Patient Instructions (Signed)
[facility] Discharge Instructions for Patients Receiving Chemotherapy  Today you received the following biotherapy agents: Nivolumab.  Take Lomotil at the first sign of diarrhea. Take 2 four times daily as needed. Call the office for diarrhea that does not respond to Lomotil.   If you develop nausea and vomiting that is not controlled by your nausea medication, call the clinic.   BELOW ARE SYMPTOMS THAT SHOULD BE REPORTED IMMEDIATELY:  *FEVER GREATER THAN 100.5 F  *CHILLS WITH OR WITHOUT FEVER  NAUSEA AND VOMITING THAT IS NOT CONTROLLED WITH YOUR NAUSEA MEDICATION  *UNUSUAL SHORTNESS OF BREATH  *UNUSUAL BRUISING OR BLEEDING  TENDERNESS IN MOUTH AND THROAT WITH OR WITHOUT PRESENCE OF ULCERS  *URINARY PROBLEMS  *BOWEL PROBLEMS  UNUSUAL RASH Items with * indicate a potential emergency and should be followed up as soon as possible.  Feel free to call the clinic you have any questions or concerns. The clinic phone number is [phone].  Please show the [facility] at check-in to the Emergency Department and triage nurse.

## 2014-07-16 NOTE — Telephone Encounter (Signed)
Gave adn printed appt sched and avs fo rpt for June and July...NO pof...extended pt appts

## 2014-07-16 NOTE — Patient Instructions (Signed)
Your gabapentin has been increased to 300 mg by mouth 3 times daily Follow-up in 2 weeks prior to next scheduled cycle of immunotherapy

## 2014-07-16 NOTE — Progress Notes (Signed)
BMS [id number]; Group B: Arm A: Cycle 6, Day 1 Met with patient and significant other in exam room for scheduled visit, cycle 6/day 1. Patient reports that diarrhea episodes have resolved and that she has not had any diarrhea since Monday evening (06/06). Patient reports she has not had to take the lomotil that was prescribed to her during her visit in symptom management clinic with NP, Selena Lesser, Monday. Patient reports energy and appetite as "pretty good." Patient continues to report with difficulty sleeping and takes valium nightly. Patient also reports Neurontin has had no affect on her continuous neuropathy. Per PA, Awilda Metro, patient to increase Neurontin to 3 capsules, 3 times per day. Patient denies; pain, nausea, vomiting, bowel or bladder issues, chest pain, cough or rashes. Patient continues to report "some" shortness of breath with exertion. Patient reports no other symptoms today, other than what is documented during this visit. Per MD and PA, patients labs within treatment level and patient to continue with cycle 6 today. Patient gave verbal understanding of new Neurontin instructions. All patient's questions answered to her satisfaction. Patient knows to call clinic should she have more complications with diarrhea and any other new symptoms. Encouraged patient to call with any questions/concerns. I thanked patient for her time and she knows to return in two weeks for next appointment on 06/22 lab/PA/tx. Nivolumab vial assignment received from study website. Vial assignment given to pharmacist Romualdo Bolk, infusion sign provided to treatment nurse Rosalio Macadamia, RN.  [name], RN, Clinical Research 07/16/2014 11:18 AM

## 2014-07-19 ENCOUNTER — Encounter (HOSPITAL_COMMUNITY): Payer: Self-pay | Admitting: Emergency Medicine

## 2014-07-19 ENCOUNTER — Emergency Department (HOSPITAL_COMMUNITY)
Admission: EM | Admit: 2014-07-19 | Discharge: 2014-07-19 | Disposition: A | Discharge status: Home / Self Care | Payer: Medicare Other | Attending: Emergency Medicine | Admitting: Emergency Medicine

## 2014-07-19 DIAGNOSIS — R197 Diarrhea, unspecified: Secondary | ICD-10-CM | POA: Insufficient documentation

## 2014-07-19 DIAGNOSIS — K219 Gastro-esophageal reflux disease without esophagitis: Secondary | ICD-10-CM | POA: Insufficient documentation

## 2014-07-19 DIAGNOSIS — Z79899 Other long term (current) drug therapy: Secondary | ICD-10-CM | POA: Insufficient documentation

## 2014-07-19 DIAGNOSIS — Z87891 Personal history of nicotine dependence: Secondary | ICD-10-CM | POA: Insufficient documentation

## 2014-07-19 DIAGNOSIS — R5383 Other fatigue: Secondary | ICD-10-CM | POA: Diagnosis not present

## 2014-07-19 DIAGNOSIS — I10 Essential (primary) hypertension: Secondary | ICD-10-CM | POA: Diagnosis not present

## 2014-07-19 DIAGNOSIS — C3490 Malignant neoplasm of unspecified part of unspecified bronchus or lung: Secondary | ICD-10-CM | POA: Diagnosis not present

## 2014-07-19 DIAGNOSIS — R110 Nausea: Secondary | ICD-10-CM | POA: Diagnosis not present

## 2014-07-19 DIAGNOSIS — J449 Chronic obstructive pulmonary disease, unspecified: Secondary | ICD-10-CM | POA: Diagnosis not present

## 2014-07-19 DIAGNOSIS — E860 Dehydration: Secondary | ICD-10-CM

## 2014-07-19 DIAGNOSIS — Z8701 Personal history of pneumonia (recurrent): Secondary | ICD-10-CM | POA: Diagnosis not present

## 2014-07-19 DIAGNOSIS — Z8739 Personal history of other diseases of the musculoskeletal system and connective tissue: Secondary | ICD-10-CM | POA: Insufficient documentation

## 2014-07-19 DIAGNOSIS — Z862 Personal history of diseases of the blood and blood-forming organs and certain disorders involving the immune mechanism: Secondary | ICD-10-CM | POA: Diagnosis not present

## 2014-07-19 DIAGNOSIS — F419 Anxiety disorder, unspecified: Secondary | ICD-10-CM | POA: Diagnosis not present

## 2014-07-19 LAB — BASIC METABOLIC PANEL
Anion gap: 8 (ref 5–15)
BUN: 21 mg/dL — ABNORMAL HIGH (ref 6–20)
CO2: 28 mmol/L (ref 22–32)
CREATININE: 1.01 mg/dL — AB (ref 0.44–1.00)
Calcium: 8.7 mg/dL — ABNORMAL LOW (ref 8.9–10.3)
Chloride: 99 mmol/L — ABNORMAL LOW (ref 101–111)
GFR calc Af Amer: >60 mL/min (ref >60)
GFR calc non Af Amer: 55 mL/min — ABNORMAL LOW (ref >60)
GLUCOSE: 108 mg/dL — AB (ref 65–99)
POTASSIUM: 3.4 mmol/L — AB (ref 3.5–5.1)
SODIUM: 135 mmol/L (ref 135–145)

## 2014-07-19 LAB — CBC WITH DIFFERENTIAL/PLATELET
BASOS ABS: 0 10*3/uL (ref 0.0–0.1)
BASOS PCT: 0 % (ref 0–1)
Eosinophils Absolute: 0.1 10*3/uL (ref 0.0–0.7)
Eosinophils Relative: 1 % (ref 0–5)
HCT: 33 % — ABNORMAL LOW (ref 36.0–46.0)
Hemoglobin: 10.6 g/dL — ABNORMAL LOW (ref 12.0–15.0)
Lymphocytes Relative: 19 % (ref 12–46)
Lymphs Abs: 1.2 10*3/uL (ref 0.7–4.0)
MCH: 30.8 pg (ref 26.0–34.0)
MCHC: 32.1 g/dL (ref 30.0–36.0)
MCV: 95.9 fL (ref 78.0–100.0)
MONOS PCT: 9 % (ref 3–12)
Monocytes Absolute: 0.6 10*3/uL (ref 0.1–1.0)
Neutro Abs: 4.4 10*3/uL (ref 1.7–7.7)
Neutrophils Relative %: 71 % (ref 43–77)
Platelets: 262 10*3/uL (ref 150–400)
RBC: 3.44 MIL/uL — ABNORMAL LOW (ref 3.87–5.11)
RDW: 14.3 % (ref 11.5–15.5)
WBC: 6.3 10*3/uL (ref 4.0–10.5)

## 2014-07-19 MED ORDER — SODIUM CHLORIDE 0.9 % IV BOLUS (SEPSIS)
1000.0000 mL | Freq: Once | INTRAVENOUS | Status: AC
  Administered 2014-07-19: 1000 mL via INTRAVENOUS

## 2014-07-19 MED ORDER — HEPARIN SOD (PORK) LOCK FLUSH 100 UNIT/ML IV SOLN
500.0000 [IU] | Freq: Once | INTRAVENOUS | Status: AC
Start: 2014-07-19 — End: 2014-07-19
  Administered 2014-07-19: 500 [IU]
  Filled 2014-07-19: qty 5

## 2014-07-19 MED ORDER — POTASSIUM CHLORIDE CRYS ER 20 MEQ PO TBCR
40.0000 meq | EXTENDED_RELEASE_TABLET | Freq: Once | ORAL | Status: AC
  Administered 2014-07-19: 40 meq via ORAL
  Filled 2014-07-19: qty 2

## 2014-07-19 NOTE — Discharge Instructions (Signed)
If you were given medicines take as directed.  If you are on coumadin or contraceptives realize their levels and effectiveness is altered by many different medicines.  If you have any reaction (rash, tongues swelling, other) to the medicines stop taking and see a physician.    If your blood pressure was elevated in the ER make sure you follow up for management with a primary doctor or return for chest pain, shortness of breath or stroke symptoms.  Please follow up as directed and return to the ER or see a physician for new or worsening symptoms.  Thank you. Filed Vitals:   07/19/14 1533  BP: 147/92  Pulse: 94  Temp: 97.8 F (36.6 C)  TempSrc: Oral  Resp: 21  SpO2: 99%

## 2014-07-19 NOTE — ED Notes (Addendum)
Pt reports diarrhea for past 3 days, has had 4 episodes today. No emesis, some abd cramping. Last chemo Wednesday.

## 2014-07-19 NOTE — ED Provider Notes (Signed)
CSN: [id number]     Arrival date & time 07/19/14  1525 History   First MD Initiated Contact with Patient 07/19/14 1531     Chief Complaint  Patient presents with  . Diarrhea     (Consider location/radiation/quality/duration/timing/severity/associated sxs/prior Treatment) HPI Comments: 69 year old female history of reflux, COPD, stage IV lung cancer followed by oncology with chemotherapy history, anemia presents with recurrent diarrhea for 3 days nonbloody, no recent antibody, no C. difficile history. Partially 4 episodes per day. Patient feels low more fatigued in general mild dehydration. Patient tried Imodium with minimal improvement.  Mild abdominal cramping no focal pain. No fevers.  Patient is a 69 y.o. female presenting with diarrhea. The history is provided by the patient.  Diarrhea Associated symptoms: no abdominal pain, no chills, no fever, no headaches and no vomiting     Past Medical History  Diagnosis Date  . Hypertension   . COPD (chronic obstructive pulmonary disease)   . Hyperlipidemia   . GERD (gastroesophageal reflux disease)   . Osteopenia   . Insomnia   . Mediastinal lymphadenopathy 11/19/13    PER CT  . Multiple lung nodules on CT 11/19/13    LEFT LUNG BASE  . Lung mass 11/19/13    RUL PER CT  . Coronary artery calcification seen on CAT scan 11/21/2013  . COPD (chronic obstructive pulmonary disease) 11/21/2013  . Pneumonia     hx of  . Anxiety    Past Surgical History  Procedure Laterality Date  .  c section x 2    . Urethral cyst removed    . Tonsillectomy    . Appendectomy    . Colonoscopy with propofol N/A 07/17/2012    Procedure: COLONOSCOPY WITH PROPOFOL;  Surgeon: Garlan Fair, MD;  Location: [facility];  Service: Endoscopy;  Laterality: N/A;  . Esophagogastroduodenoscopy (egd) with propofol N/A 07/17/2012    Procedure: ESOPHAGOGASTRODUODENOSCOPY (EGD) WITH PROPOFOL;  Surgeon: Garlan Fair, MD;  Location: [facility];  Service:  Endoscopy;  Laterality: N/A;  . Video bronchoscopy with endobronchial ultrasound N/A 12/03/2013    Procedure: VIDEO BRONCHOSCOPY WITH ENDOBRONCHIAL ULTRASOUND WITH BIOPSIES;  Surgeon: Grace Isaac, MD;  Location: [facility];  Service: Thoracic;  Laterality: N/A;  . Portacath placement Left 12/10/2013    Procedure: INSERTION PORT-A-CATH;  Surgeon: Grace Isaac, MD;  Location: [facility];  Service: Thoracic;  Laterality: Left;   Family History  Problem Relation Age of Onset  . Diabetes Father   . Heart disease Father     MI  . CVA Mother   . Cancer Cousin     LIPOSARCOMA  . Heart disease Cousin     HEART TRANSPLANTS X 2   History  Substance Use Topics  . Smoking status: Former Smoker -- 0.50 packs/day for 50 years    Types: Cigarettes    Quit date: 10/08/2012  . Smokeless tobacco: Never Used     Comment: smokes Vapor daily  . Alcohol Use: Yes     Comment: social   OB History    No data available     Review of Systems  Constitutional: Positive for fatigue. Negative for fever and chills.  HENT: Negative for congestion.   Eyes: Negative for visual disturbance.  Respiratory: Negative for shortness of breath.   Cardiovascular: Negative for chest pain.  Gastrointestinal: Positive for nausea and diarrhea. Negative for vomiting and abdominal pain.  Genitourinary: Negative for dysuria and flank pain.  Musculoskeletal: Negative for back pain, neck pain and  neck stiffness.  Skin: Negative for rash.  Neurological: Negative for light-headedness and headaches.      Allergies  Review of patient's allergies indicates no known allergies.  Home Medications   Prior to Admission medications   Medication Sig Start Date End Date Taking? Authorizing Provider  acetaminophen (TYLENOL) 325 MG tablet Take 650 mg by mouth daily as needed for moderate pain or headache. Take 2 tablets every morning   Yes Historical Provider, MD  albuterol (PROVENTIL HFA;VENTOLIN HFA) 108 (90 BASE) MCG/ACT  inhaler Inhale 2 puffs into the lungs every 6 (six) hours as needed for wheezing.   Yes Historical Provider, MD  calcium carbonate (TUMS - DOSED IN MG ELEMENTAL CALCIUM) 500 MG chewable tablet Chew 1 tablet by mouth daily as needed for indigestion or heartburn.    Yes Historical Provider, MD  diazepam (VALIUM) 5 MG tablet Take 5 mg by mouth at bedtime.   Yes Historical Provider, MD  diphenoxylate-atropine (LOMOTIL) 2.5-0.025 MG per tablet Take 2 tablets by mouth 4 (four) times daily as needed for diarrhea or loose stools. 07/14/14  Yes Susanne Borders, NP  docusate sodium (COLACE) 100 MG capsule Take 100 mg by mouth 2 (two) times daily as needed for moderate constipation.    Yes Historical Provider, MD  DULoxetine (CYMBALTA) 30 MG capsule TAKE 1 CAPSULE (30 MG TOTAL) BY MOUTH DAILY. 06/03/14  Yes Curt Bears, MD  gabapentin (NEURONTIN) 300 MG capsule Take 1 capsule (300 mg total) by mouth 3 (three) times daily. 07/16/14  Yes Carlton Adam, PA-C  HYDROcodone-acetaminophen (NORCO) 10-325 MG per tablet Take 1 tablet by mouth every 6 (six) hours as needed for moderate pain or severe pain.    Yes Historical Provider, MD  lidocaine-prilocaine (EMLA) cream Apply 1 application topically as needed. 12/05/13  Yes Curt Bears, MD  lisinopril-hydrochlorothiazide (PRINZIDE,ZESTORETIC) 20-12.5 MG per tablet Take 2 tablets by mouth every morning.    Yes Historical Provider, MD  loratadine (CLARITIN) 10 MG tablet Take 10 mg by mouth daily.   Yes Historical Provider, MD  magnesium oxide (MAG-OX) 400 (241.3 MG) MG tablet Take 1 tablet (400 mg total) by mouth 2 (two) times daily. 06/04/14  Yes Adrena E Johnson, PA-C  metoprolol succinate (TOPROL-XL) 50 MG 24 hr tablet Take 50 mg by mouth every morning. Take with or immediately following a meal.   Yes Historical Provider, MD  Multiple Vitamin (MULTIVITAMIN WITH MINERALS) TABS Take 1 tablet by mouth daily.   Yes Historical Provider, MD  polyethylene glycol (MIRALAX /  GLYCOLAX) packet Take 17 g by mouth daily as needed for moderate constipation or severe constipation.    Yes Historical Provider, MD  prochlorperazine (COMPAZINE) 10 MG tablet Take 1 tablet (10 mg total) by mouth every 6 (six) hours as needed for nausea or vomiting. 04/23/14  Yes Owens Shark, NP  ranitidine (ZANTAC) 150 MG tablet Take 150 mg by mouth 2 (two) times daily as needed for heartburn.    Yes Historical Provider, MD  SYMBICORT 80-4.5 MCG/ACT inhaler Inhale 2 puffs into the lungs 2 (two) times daily.  04/30/14  Yes Historical Provider, MD   BP 147/92 mmHg  Pulse 94  Temp(Src) 97.8 F (36.6 C) (Oral)  Resp 21  SpO2 99% Physical Exam  Constitutional: She is oriented to person, place, and time. She appears well-developed and well-nourished.  HENT:  Head: Normocephalic and atraumatic.  Dry mucous membranes  Eyes: Conjunctivae are normal. Right eye exhibits no discharge. Left eye exhibits no discharge.  Neck: Normal range of motion. Neck supple. No tracheal deviation present.  Cardiovascular: Normal rate and regular rhythm.   Pulmonary/Chest: Effort normal and breath sounds normal.  Abdominal: Soft. She exhibits no distension. There is no tenderness. There is no guarding.  Musculoskeletal: She exhibits no edema.  Neurological: She is alert and oriented to person, place, and time.  Skin: Skin is warm. No rash noted.  Psychiatric: She has a normal mood and affect.  Nursing note and vitals reviewed.   ED Course  Procedures (including critical care time) Labs Review Labs Reviewed  CBC WITH DIFFERENTIAL/PLATELET - Abnormal; Notable for the following:    RBC 3.44 (*)    Hemoglobin 10.6 (*)    HCT 33.0 (*)    All other components within normal limits  BASIC METABOLIC PANEL - Abnormal; Notable for the following:    Potassium 3.4 (*)    Chloride 99 (*)    Glucose, Bld 108 (*)    BUN 21 (*)    Creatinine, Ser 1.01 (*)    Calcium 8.7 (*)    GFR calc non Af Amer 55 (*)    All  other components within normal limits  STOOL CULTURE    Imaging Review No results found.   EKG Interpretation None      MDM   Final diagnoses:  Diarrhea  Dehydration    Patient presents with mild dehydration and recurrent diarrhea for 3 days.   plan for stool culture to help outpatient physician and follow. IV fluids, basic blood work reviewed mild low potassium. Oral potassium given. Mild worsening kidney function, IV fluids given. Patient stable for outpatient follow-up.  Pt improved on recheck.  Po K given. Tolerating po.  No episodes in ED.   Results and differential diagnosis were discussed with the patient/parent/guardian. Close follow up outpatient was discussed, comfortable with the plan.   Medications  sodium chloride 0.9 % bolus 1,000 mL (0 mLs Intravenous Stopped 07/19/14 1710)  potassium chloride SA (K-DUR,KLOR-CON) CR tablet 40 mEq (40 mEq Oral Given 07/19/14 1704)    Filed Vitals:   07/19/14 1533  BP: 147/92  Pulse: 94  Temp: 97.8 F (36.6 C)  TempSrc: Oral  Resp: 21  SpO2: 99%    Final diagnoses:  Diarrhea  Dehydration      Elnora Morrison, MD 07/19/14 1747

## 2014-07-21 ENCOUNTER — Telehealth: Payer: Self-pay | Admitting: Medical Oncology

## 2014-07-21 NOTE — Telephone Encounter (Signed)
BMS 370: Group B, Arm A Follow up call to patient d/t patient ED visit on Saturday 06/11 for diarrhea. Patient states onset of diarrhea started Thursday evening 06/09 and resolved by Sunday morning 06/12, reporting up to 4 episodes per day. Patient states she had no other symptoms other than cramping during the days of diarrhea, denies nausea or vomiting as well as pain. Patient was given IVF and potassium chloride SA CR tablet 40 mEq in ED and discharged same day. Patient does state she took only OTC  Imodium '2mg'$ , '4mg'$  with initial diarrhea and then 2 mg after each episode and stopped taking Imodium on Friday. States she was concerned about dehydration and felt it best to go to ED. Patient reports to have had 2 "normal bowel movements" today and reports no further issues at this time. I encouraged patient to call clinic should she develop any other symtoms/questions/concerns. Patient gave verbal understanding.  Mssg forwarded to Dr. [name] for review. [name], RN, Clinical Research 07/21/2014 4:16 PM

## 2014-07-21 NOTE — Telephone Encounter (Signed)
Pt fells better today had a normal Bm and tolerating the BRAT diet and gatorade. I instructed her to call for any problems.

## 2014-07-22 ENCOUNTER — Telehealth: Payer: Self-pay | Admitting: Internal Medicine

## 2014-07-22 NOTE — Telephone Encounter (Signed)
AM PAL - moved 7/6 appointments to 12:30pm. Spoke with patient and she will get new schedule 6/22. Confirmed 07/30/14 appointment.

## 2014-07-29 ENCOUNTER — Other Ambulatory Visit: Payer: Self-pay | Admitting: Medical Oncology

## 2014-07-29 DIAGNOSIS — C3492 Malignant neoplasm of unspecified part of left bronchus or lung: Secondary | ICD-10-CM

## 2014-07-30 ENCOUNTER — Ambulatory Visit: Payer: Medicare Other

## 2014-07-30 ENCOUNTER — Other Ambulatory Visit (HOSPITAL_COMMUNITY)
Admission: RE | Admit: 2014-07-30 | Discharge: 2014-07-30 | Disposition: A | Discharge status: Home / Self Care | Payer: Medicare Other | Source: Ambulatory Visit | Attending: Internal Medicine | Admitting: Internal Medicine

## 2014-07-30 ENCOUNTER — Encounter: Payer: Self-pay | Admitting: Medical Oncology

## 2014-07-30 ENCOUNTER — Telehealth: Payer: Self-pay | Admitting: *Deleted

## 2014-07-30 ENCOUNTER — Other Ambulatory Visit (HOSPITAL_BASED_OUTPATIENT_CLINIC_OR_DEPARTMENT_OTHER): Payer: Medicare Other

## 2014-07-30 ENCOUNTER — Other Ambulatory Visit: Payer: Self-pay | Admitting: Medical Oncology

## 2014-07-30 ENCOUNTER — Ambulatory Visit (HOSPITAL_BASED_OUTPATIENT_CLINIC_OR_DEPARTMENT_OTHER): Payer: Medicare Other

## 2014-07-30 ENCOUNTER — Telehealth: Payer: Self-pay | Admitting: Internal Medicine

## 2014-07-30 ENCOUNTER — Other Ambulatory Visit: Payer: Medicare Other

## 2014-07-30 ENCOUNTER — Ambulatory Visit (HOSPITAL_BASED_OUTPATIENT_CLINIC_OR_DEPARTMENT_OTHER): Payer: Medicare Other | Admitting: Physician Assistant

## 2014-07-30 ENCOUNTER — Encounter: Payer: Self-pay | Admitting: Physician Assistant

## 2014-07-30 ENCOUNTER — Ambulatory Visit: Payer: Medicare Other | Admitting: Physician Assistant

## 2014-07-30 VITALS — BP 141/74 | HR 85 | Temp 98.4°F | Resp 18 | Ht 62.0 in | Wt 166.7 lb

## 2014-07-30 DIAGNOSIS — Z006 Encounter for examination for normal comparison and control in clinical research program: Secondary | ICD-10-CM | POA: Diagnosis not present

## 2014-07-30 DIAGNOSIS — C3492 Malignant neoplasm of unspecified part of left bronchus or lung: Secondary | ICD-10-CM

## 2014-07-30 DIAGNOSIS — E860 Dehydration: Secondary | ICD-10-CM

## 2014-07-30 DIAGNOSIS — C3490 Malignant neoplasm of unspecified part of unspecified bronchus or lung: Secondary | ICD-10-CM | POA: Diagnosis present

## 2014-07-30 DIAGNOSIS — R197 Diarrhea, unspecified: Secondary | ICD-10-CM | POA: Diagnosis not present

## 2014-07-30 DIAGNOSIS — E876 Hypokalemia: Secondary | ICD-10-CM

## 2014-07-30 DIAGNOSIS — Z79899 Other long term (current) drug therapy: Secondary | ICD-10-CM

## 2014-07-30 LAB — T3, FREE: T3, Free: 5.4 pg/mL — ABNORMAL HIGH (ref 2.3–4.2)

## 2014-07-30 LAB — TSH CHCC

## 2014-07-30 LAB — CBC WITH DIFFERENTIAL/PLATELET
BASO%: 0.4 % (ref 0.0–2.0)
Basophils Absolute: 0 10*3/uL (ref 0.0–0.1)
EOS ABS: 0 10*3/uL (ref 0.0–0.5)
EOS%: 0.7 % (ref 0.0–7.0)
HEMATOCRIT: 33.3 % — AB (ref 34.8–46.6)
HGB: 11 g/dL — ABNORMAL LOW (ref 11.6–15.9)
LYMPH#: 0.9 10*3/uL (ref 0.9–3.3)
LYMPH%: 13.6 % — ABNORMAL LOW (ref 14.0–49.7)
MCH: 30.1 pg (ref 25.1–34.0)
MCHC: 32.9 g/dL (ref 31.5–36.0)
MCV: 91.6 fL (ref 79.5–101.0)
MONO#: 0.5 10*3/uL (ref 0.1–0.9)
MONO%: 8.4 % (ref 0.0–14.0)
NEUT%: 76.9 % — AB (ref 38.4–76.8)
NEUTROS ABS: 5 10*3/uL (ref 1.5–6.5)
Platelets: 274 10*3/uL (ref 145–400)
RBC: 3.64 10*6/uL — ABNORMAL LOW (ref 3.70–5.45)
RDW: 15.3 % — ABNORMAL HIGH (ref 11.2–14.5)
WBC: 6.5 10*3/uL (ref 3.9–10.3)

## 2014-07-30 LAB — COMPREHENSIVE METABOLIC PANEL (CC13)
ALK PHOS: 65 U/L (ref 40–150)
ALT: 16 U/L (ref 0–55)
AST: 17 U/L (ref 5–34)
Albumin: 3.5 g/dL (ref 3.5–5.0)
Anion Gap: 10 mEq/L (ref 3–11)
BILIRUBIN TOTAL: 0.46 mg/dL (ref 0.20–1.20)
BUN: 10.7 mg/dL (ref 7.0–26.0)
CO2: 29 mEq/L (ref 22–29)
Calcium: 9.1 mg/dL (ref 8.4–10.4)
Chloride: 90 mEq/L — ABNORMAL LOW (ref 98–109)
Creatinine: 0.8 mg/dL (ref 0.6–1.1)
EGFR: 73 mL/min/{1.73_m2} — ABNORMAL LOW (ref >90)
Glucose: 92 mg/dl (ref 70–140)
POTASSIUM: 3.3 meq/L — AB (ref 3.5–5.1)
SODIUM: 129 meq/L — AB (ref 136–145)
TOTAL PROTEIN: 6.7 g/dL (ref 6.4–8.3)

## 2014-07-30 LAB — PHOSPHORUS: Phosphorus: 3.5 mg/dL (ref 2.5–4.6)

## 2014-07-30 LAB — MAGNESIUM (CC13): Magnesium: 1.3 mg/dl — CL (ref 1.5–2.5)

## 2014-07-30 LAB — RESEARCH LABS

## 2014-07-30 LAB — T4, FREE: Free T4: 2.05 ng/dL — ABNORMAL HIGH (ref 0.80–1.80)

## 2014-07-30 LAB — LACTATE DEHYDROGENASE (CC13): LDH: 170 U/L (ref 125–245)

## 2014-07-30 MED ORDER — POTASSIUM CHLORIDE CRYS ER 20 MEQ PO TBCR: 20.0000 meq | EXTENDED_RELEASE_TABLET | Freq: Every day | ORAL | Status: DC

## 2014-07-30 MED ORDER — SODIUM CHLORIDE 0.9 % IV SOLN
Freq: Once | INTRAVENOUS | Status: AC
Start: 2014-07-30 — End: 2014-07-30
  Administered 2014-07-30: 13:00:00 via INTRAVENOUS

## 2014-07-30 MED ORDER — HEPARIN SOD (PORK) LOCK FLUSH 100 UNIT/ML IV SOLN
500.0000 [IU] | INTRAVENOUS | Status: AC | PRN
  Administered 2014-07-30: 500 [IU]
  Filled 2014-07-30: qty 5

## 2014-07-30 MED ORDER — MAGNESIUM SULFATE 50 % IJ SOLN
INTRAMUSCULAR | Status: DC
Start: 2014-07-30 — End: 2014-08-05
  Administered 2014-07-30: 13:00:00 via INTRAVENOUS
  Filled 2014-07-30: qty 1000

## 2014-07-30 MED ORDER — SODIUM CHLORIDE 0.9 % IJ SOLN
10.0000 mL | INTRAMUSCULAR | Status: AC | PRN
  Administered 2014-07-30: 10 mL
  Filled 2014-07-30: qty 10

## 2014-07-30 NOTE — Telephone Encounter (Signed)
Per staff message and POF I have scheduled appts. Advised scheduler of appts. JMW  

## 2014-07-30 NOTE — Progress Notes (Signed)
BMS 370 Group B: Arm A, Cycle 7 week 12 Met with patient and significant other in exam room, after patient's lab appointment. Patient here today for Cycle 7, lab and office visit with PA Awilda Metro. Patient reports having intermittent diarrhea starting June 11, in which she visited ED and was given IVF and  1 tab of 40 meq potassium (as was reported on the 13th research encounter), patient reports a few days of normal bowel movements and on the 13th one episode of diarrhea, the 17th another episode of diarrhea, in which she took  imodium, and on the 19th reports 11 episodes of diarrhea throughout the day, she continues to report 2 episodes of diarrhea on the 20th, soft stool on the 21st and this morning reports to have had 4 episodes of diarrhea. Patient reports to have taken lomotil and imodium as instructed, during the times was experiencing diarrhea, with no relief. Patient kept a diary of occurences and provided research nurse with copy.  Patient denies nausea or vomiting, denies having any other symptoms including pain and bleeding. Denies taking any new medication.  Patient confirms that her bowels normally move an average of twice daily with a soft consistency. Chemistry labs resulted with low potassium and magnesium. Dr [name] reviewed patient's labs and symptoms. Per MD, Cycle 7 to be held today, patient to receive IVF with magnesium and patient to start potassium 20 meq for 7 days. Patient educated on BRAT diet and to drink plenty of fluids. Patient to return in one week for follow up and review of symptoms, labs and Cycle 7. Patient instructed to go ED should symptoms worsen. Patient gave verbal understanding, denies further questions at this time and knows to call Dr. [name] or myself with any questions or concerns. Patient to return 08/06/2014 for f/u. [name], RN, Clinical Research 07/30/2014 5:07 PM

## 2014-07-30 NOTE — Progress Notes (Addendum)
[facility] [phone]   Fax:([phone] [place] [address] [employer] [address] [place] [address]  DIAGNOSIS: Stage IV (T2, N2, M1a) non-small cell lung cancer, squamous cell carcinoma presented with central left upper lobe lung mass with associated postobstructive atelectasis as well as ipsilateral and subcarinal lymphadenopathy as well as pleural tumor spread diagnosed in October 2015.  PRIOR THERAPY: Systemic chemotherapy with carboplatin for AUC of 5 on day 1 and Abraxane 100 MG/M2 on days 1, 8 and 15 every 3 weeks. Status post 6 cycles.   CURRENT THERAPY: Nivolumab per research protocol BMS 370 group B, arm A, given every 2 weeks. Status post 6 cycles  INTERVAL HISTORY: Tammie Clay 69 y.o. female returns to the clinic today for follow-up visit accompanied by her significant other. She is currently undergoing treatment with immunotherapy with Nivolumab 3 MG/KG every 2 weeks is status post 6 cycles. She did have some issues with diarrhea and was evaluated in the emergency room. She reports that she normally has 2 bowel movements daily that are soft in nature. Her "diarrhea" consists of her stool becoming looser and looser and then watery. She's not had any blood or mucus contained within the diarrhea. She reports occasional abdominal cramping but no frank abdominal pain. She denied fever, chills, shortness breath or cough. She's had no skin rashes. She was taking oral magnesium as prescribed.  The patient denied having any significant chest pain, shortness of breath, cough or hemoptysis. She has no nausea or vomiting. She denied having any significant weight loss or night sweats.   MEDICAL HISTORY: Past Medical History  Diagnosis Date  . Hypertension   . COPD (chronic obstructive pulmonary disease)   . Hyperlipidemia   . GERD (gastroesophageal reflux disease)   . Osteopenia   . Insomnia   . Mediastinal  lymphadenopathy 11/19/13    PER CT  . Multiple lung nodules on CT 11/19/13    LEFT LUNG BASE  . Lung mass 11/19/13    RUL PER CT  . Coronary artery calcification seen on CAT scan 11/21/2013  . COPD (chronic obstructive pulmonary disease) 11/21/2013  . Pneumonia     hx of  . Anxiety     ALLERGIES:  has No Known Allergies.  MEDICATIONS:  Current Outpatient Prescriptions  Medication Sig Dispense Refill  . acetaminophen (TYLENOL) 325 MG tablet Take 650 mg by mouth daily as needed for moderate pain or headache. Take 2 tablets every morning    . albuterol (PROVENTIL HFA;VENTOLIN HFA) 108 (90 BASE) MCG/ACT inhaler Inhale 2 puffs into the lungs every 6 (six) hours as needed for wheezing.    . calcium carbonate (TUMS - DOSED IN MG ELEMENTAL CALCIUM) 500 MG chewable tablet Chew 1 tablet by mouth daily as needed for indigestion or heartburn.     . diazepam (VALIUM) 5 MG tablet Take 5 mg by mouth at bedtime.    . diphenoxylate-atropine (LOMOTIL) 2.5-0.025 MG per tablet Take 2 tablets by mouth 4 (four) times daily as needed for diarrhea or loose stools. 30 tablet 0  . docusate sodium (COLACE) 100 MG capsule Take 100 mg by mouth 2 (two) times daily as needed for moderate constipation.     . DULoxetine (CYMBALTA) 30 MG capsule TAKE 1 CAPSULE (30 MG TOTAL) BY MOUTH DAILY. 30 capsule 2  . gabapentin (NEURONTIN) 300 MG capsule Take 1 capsule (300 mg total) by mouth 3 (three) times daily. [place]  capsule 2  . HYDROcodone-acetaminophen (NORCO) 10-325 MG per tablet Take 1 tablet by mouth every 6 (six) hours as needed for moderate pain or severe pain.     Marland Kitchen lidocaine-prilocaine (EMLA) cream Apply 1 application topically as needed. 30 g 0  . lisinopril-hydrochlorothiazide (PRINZIDE,ZESTORETIC) 20-12.5 MG per tablet Take 2 tablets by mouth every morning.     . loperamide (IMODIUM) 2 MG capsule Take by mouth as needed for diarrhea or loose stools.    Marland Kitchen loratadine (CLARITIN) 10 MG tablet Take 10 mg by mouth daily.      . magnesium oxide (MAG-OX) 400 (241.3 MG) MG tablet Take 1 tablet (400 mg total) by mouth 2 (two) times daily. 60 tablet 0  . metoprolol succinate (TOPROL-XL) 50 MG 24 hr tablet Take 50 mg by mouth every morning. Take with or immediately following a meal.    . Multiple Vitamin (MULTIVITAMIN WITH MINERALS) TABS Take 1 tablet by mouth daily.    . prochlorperazine (COMPAZINE) 10 MG tablet Take 1 tablet (10 mg total) by mouth every 6 (six) hours as needed for nausea or vomiting. 30 tablet 1  . ranitidine (ZANTAC) 150 MG tablet Take 150 mg by mouth 2 (two) times daily as needed for heartburn.     . SYMBICORT 80-4.5 MCG/ACT inhaler Inhale 2 puffs into the lungs 2 (two) times daily.   3  . polyethylene glycol (MIRALAX / GLYCOLAX) packet Take 17 g by mouth daily as needed for moderate constipation or severe constipation.      Current Facility-Administered Medications  Medication Dose Route Frequency Provider Last Rate Last Dose  . sodium chloride 0.9 % 1,000 mL with magnesium sulfate 2 g infusion   Intravenous Continuous Carlton Adam, PA-C   Stopped at 07/30/14 1523    SURGICAL HISTORY:  Past Surgical History  Procedure Laterality Date  .  c section x 2    . Urethral cyst removed    . Tonsillectomy    . Appendectomy    . Colonoscopy with propofol N/A 07/17/2012    Procedure: COLONOSCOPY WITH PROPOFOL;  Surgeon: Garlan Fair, MD;  Location: [facility];  Service: Endoscopy;  Laterality: N/A;  . Esophagogastroduodenoscopy (egd) with propofol N/A 07/17/2012    Procedure: ESOPHAGOGASTRODUODENOSCOPY (EGD) WITH PROPOFOL;  Surgeon: Garlan Fair, MD;  Location: [facility];  Service: Endoscopy;  Laterality: N/A;  . Video bronchoscopy with endobronchial ultrasound N/A 12/03/2013    Procedure: VIDEO BRONCHOSCOPY WITH ENDOBRONCHIAL ULTRASOUND WITH BIOPSIES;  Surgeon: Grace Isaac, MD;  Location: [facility];  Service: Thoracic;  Laterality: N/A;  . Portacath placement Left 12/10/2013     Procedure: INSERTION PORT-A-CATH;  Surgeon: Grace Isaac, MD;  Location: [facility];  Service: Thoracic;  Laterality: Left;    REVIEW OF SYSTEMS:  A comprehensive review of systems was negative except for: Gastrointestinal: positive for diarrhea and that has resolved Neurological: positive for paresthesia   PHYSICAL EXAMINATION: General appearance: alert, cooperative, fatigued and no distress Head: Normocephalic, without obvious abnormality, atraumatic Neck: no adenopathy, no JVD, supple, symmetrical, trachea midline and thyroid not enlarged, symmetric, no tenderness/mass/nodules Lymph nodes: Cervical, supraclavicular, and axillary nodes normal. Resp: clear to auscultation bilaterally Back: symmetric, no curvature. ROM normal. No CVA tenderness. Cardio: regular rate and rhythm, S1, S2 normal, no murmur, click, rub or gallop GI: soft, non-tender; bowel sounds normal; no masses,  no organomegaly Genitalia: defer exam Extremities: extremities normal, atraumatic, no cyanosis or edema Neurologic: Alert and oriented X 3, normal strength and tone. Normal  symmetric reflexes. Normal coordination and gait  ECOG PERFORMANCE STATUS: 1 - Symptomatic but completely ambulatory  Blood pressure 141/74, pulse 85, temperature 98.4 F (36.9 C), temperature source Oral, resp. rate 18, height '5\' 2"'$  (1.575 m), weight 166 lb 11.2 oz (75.615 kg), SpO2 96 %.  LABORATORY DATA: Lab Results  Component Value Date   WBC 6.5 07/30/2014   HGB 11.0* 07/30/2014   HCT 33.3* 07/30/2014   MCV 91.6 07/30/2014   PLT 274 07/30/2014      Chemistry      Component Value Date/Time   NA 129* 07/30/2014 1107   NA 135 07/19/2014 1607   K 3.3* 07/30/2014 1107   K 3.4* 07/19/2014 1607   CL 99* 07/19/2014 1607   CO2 29 07/30/2014 1107   CO2 28 07/19/2014 1607   BUN 10.7 07/30/2014 1107   BUN 21* 07/19/2014 1607   CREATININE 0.8 07/30/2014 1107   CREATININE 1.01* 07/19/2014 1607   CREATININE 0.80 11/22/2013 1054       Component Value Date/Time   CALCIUM 9.1 07/30/2014 1107   CALCIUM 8.7* 07/19/2014 1607   ALKPHOS 65 07/30/2014 1107   ALKPHOS 77 12/10/2013 0637   AST 17 07/30/2014 1107   AST 16 12/10/2013 0637   ALT 16 07/30/2014 1107   ALT 16 12/10/2013 0637   BILITOT 0.46 07/30/2014 1107   BILITOT 0.4 12/10/2013 0637       RADIOGRAPHIC STUDIES: No results found. ASSESSMENT AND PLAN: This is a very pleasant 69 years old white female recently diagnosed with:  1) Stage IV non-small cell lung cancer, squamous cell carcinoma completed systemic chemotherapy with carboplatin and Abraxane status post 6 cycles.  She is currently on treatment with immunotherapy according to the BMS checkmate 370 clinical trial with Nivolumab.she status post 6 cycles and tolerating her treatment fairly well. The recent CT scan of the Chest, Abdomen and pelvis showed stable disease in the measurable lesion but there was interval development of small subcarinal, paraesophageal and left hilar adenopathy consistent with metastatic lung cancer. There was also new subpleural nodules in the left lung that may reflect metastases as well. The patient was discussed with and also seen by Dr. [name]. We will discontinue the oral magnesium however her magnesium level is low at 1.3. She'll be given IV fluids today, 1 L of normal saline with 2 g magnesium sulfate. Her potassium is also low at 3.3 and a prescription for potassium chloride 20 mEq by mouth daily for the next 7 days was sent her pharmacy of record. We will delay the start of cycle #7 x 1 week. She will follow-up in one week for reevaluation prior to the start of cycle #7.  Regarding her paresthesias, continue on gabapentin 300 mg by mouth 3 times daily. S The patient was advised to call immediately if she has any concerning symptoms in the interval.  The patient voices understanding of current disease status and treatment options and is in agreement with the current care  plan.  All questions were answered. The patient knows to call the clinic with any problems, questions or concerns. We can certainly see the patient much sooner if necessary.  Carlton Adam, PA-C  07/30/2014  ADDENDUM: Hematology/Oncology Attending: I had a face to face encounter with the patient. I recommended her care plan. This is a very pleasant 69 years old white female with metastatic non-small cell lung cancer, squamous cell carcinoma who is currently undergoing treatment with immunotherapy with Nivolumab according to the BMS  checkmate 370 clinical trial status post 6 cycles. She has been tolerating her treatment fairly well but recently she had several episodes of diarrhea on daily basis with associated hypokalemia and hypomagnesemia. We will recommended for the patient to continue her current treatment with Imodium and Lomotil for now. We will arrange for the patient to receive IV hydration with normal saline in the clinic today in addition to IV magnesium sulfate. We will also give the patient prescription for potassium chloride supplements. We will continue to monitor her closely. We will delay the start of cycle #7 by 1 week until improvement of her diarrhea. For the peripheral neuropathy, she will continue on gabapentin. The patient was advised to call immediately if she has any concerning symptoms in the interval.  Disclaimer: This note was dictated with voice recognition software. Similar sounding words can inadvertently be transcribed and may be missed upon review. Eilleen Kempf., MD 08/05/2014

## 2014-07-30 NOTE — Telephone Encounter (Signed)
per pof to sch pt appt-gave pt copy of avs-MW sch trmt

## 2014-07-30 NOTE — Patient Instructions (Signed)
Hypomagnesemia Magnesium is a common ion (mineral) in the body which is needed for metabolism. It is about how the body handles food and other chemical reactions necessary for life. Only about 2% of the magnesium in our body is found in the blood. When this is low, it is called hypomagnesemia. The blood will measure only a tiny amount of the magnesium in our body. When it is low in our blood, it does not mean that the whole body supply is low. The normal serum concentration ranges from 1.8-2.5 mEq/L. When the level gets to be less than 1.0 mEq/L, a number of problems begin to happen.  CAUSES   Receiving intravenous fluids without magnesium replacement.  Loss of magnesium from the bowel by nasogastric suction.  Loss of magnesium from nausea and vomiting or severe diarrhea. Any of the inflammatory bowel conditions can cause this.  Abuse of alcohol often leads to low serum magnesium.  An inherited form of magnesium loss happens when the kidneys lose magnesium. This is called familial or primary hypomagnesemia.  Some medications such as diuretics also cause the loss of magnesium. SYMPTOMS  These following problems are worse if the changes in magnesium levels come on suddenly.  Tremor.  Confusion.  Muscle weakness.  Oversensitive to sights and sounds.  Sensitive reflexes.  Depression.  Muscular fibrillations.  Overreactivity of the nerves.  Irritability.  Psychosis.  Spasms of the hand muscles.  Tetany (where the muscles go into uncontrollable spasms). DIAGNOSIS  This condition can be diagnosed by blood tests. TREATMENT   In an emergency, magnesium can be given intravenously (by vein).  If the condition is less worrisome, it can be corrected by diet. High levels of magnesium are found in green leafy vegetables, peas, beans, and nuts among other things. It can also be given through medications by mouth.  If it is being caused by medications, changes can be made.  If  alcohol is a problem, help is available if there are difficulties giving it up. Document Released: 10/20/2004 Document Revised: 06/10/2013 Document Reviewed: 09/14/2007 [facility] Patient Information 2015 [place], [place]. This information is not intended to replace advice given to you by your health care provider. Make sure you discuss any questions you have with your health care provider.  Today you received IV fluids with magnesium.

## 2014-07-31 ENCOUNTER — Telehealth: Payer: Self-pay | Admitting: Medical Oncology

## 2014-07-31 NOTE — Telephone Encounter (Signed)
BMS 370 Group B: Arm A Patient called me this morning, states she could not remember what she was told yesterday during her office visit as to whether she was to continue taking her magnesium oxide tablets that were previously prescribed. Informed patient that per PA Awilda Metro, she was stop taking the tablets. Patient voiced confirmation that she would not take them again unless instructed to. Patient also confirmed that she has her potassium prescription and will start taking those today as instructed. Patient informed me that last night she had an additional seven more episodes of diarrhea, but she does report a regular bowel movement this morning. Encouraged patient to call Dr. [name] or myself with any questions/concerns and should her symptoms worsen or is having pain she is to go to ED. Patient gave verbal understanding.   mssg forwarded to MD/PA for review.

## 2014-08-02 ENCOUNTER — Other Ambulatory Visit: Payer: Self-pay | Admitting: Nurse Practitioner

## 2014-08-03 NOTE — Patient Instructions (Signed)
Discontinue your oral magnesium oxide. Take potassium 20 mEq by mouth for the next 7 days for your low potassium level Follow-up in one week for reevaluation prior to resuming her treatment with immunotherapy

## 2014-08-04 ENCOUNTER — Telehealth: Payer: Self-pay | Admitting: Medical Oncology

## 2014-08-04 NOTE — Telephone Encounter (Signed)
BMS 370 Group B: Arm A Patient called wanting to give me an update to her status since last weeks office visit and delayed treatment. Patient reports to still be having diarrhea daily, no more than four episodes per day. Patient reports that after her IVF + mag treatment on Wednesday (06/22) she had 4 episodes of diarrhea, Thursday (06/23) she had 3 episodes of diarrhea, Friday (06/24) one episode, Saturday (06/25) she reports to having regular BM in the day and then at night was woken up with one episode and then another in the morning, having Sunday (06/26) 4 episodes of diarrhea. Patient states this morning she had dry heaves without emesis and so far 1 episode of diarrhea. Patient does deny any nausea or vomiting, other than the one time dry heaves, she denies having any pain, no fever, and no blood in stool as well as any other symptoms. She denies no new medications, other than the potassium prescription that was prescribed to her at last visit. I confirmed with patient that she is not taking any stool softeners or laxatives. Patient was encouraged to go to ED should she develop any pain or bleeding or new symptoms, patient gave verbal understanding. Confirmed with patient that she is staying well hydrated and patient states she is drinking "more than enough of Gatorade and water." Patient with f/u appointment this Wednesday (06/29) for re-evaluation. Patient knows to call clinic with questions/concerns. Informed patient that I will forward this message to Dr. [name] for review.   mssg forwarded to MD.

## 2014-08-05 ENCOUNTER — Other Ambulatory Visit: Payer: Self-pay | Admitting: Medical Oncology

## 2014-08-05 ENCOUNTER — Encounter: Payer: Self-pay | Admitting: Medical Oncology

## 2014-08-05 ENCOUNTER — Telehealth: Payer: Self-pay | Admitting: Nurse Practitioner

## 2014-08-05 ENCOUNTER — Ambulatory Visit (HOSPITAL_COMMUNITY)
Admission: RE | Admit: 2014-08-05 | Discharge: 2014-08-05 | Disposition: A | Discharge status: Home / Self Care | Payer: Medicare Other | Source: Ambulatory Visit | Attending: Internal Medicine | Admitting: Internal Medicine

## 2014-08-05 ENCOUNTER — Other Ambulatory Visit (HOSPITAL_BASED_OUTPATIENT_CLINIC_OR_DEPARTMENT_OTHER): Payer: Medicare Other

## 2014-08-05 ENCOUNTER — Ambulatory Visit (HOSPITAL_BASED_OUTPATIENT_CLINIC_OR_DEPARTMENT_OTHER): Payer: Medicare Other | Admitting: Nurse Practitioner

## 2014-08-05 ENCOUNTER — Other Ambulatory Visit (HOSPITAL_COMMUNITY)
Admission: AD | Admit: 2014-08-05 | Discharge: 2014-08-05 | Disposition: A | Discharge status: Home / Self Care | Payer: Medicare Other | Source: Ambulatory Visit | Attending: Internal Medicine | Admitting: Internal Medicine

## 2014-08-05 ENCOUNTER — Telehealth: Payer: Self-pay | Admitting: Medical Oncology

## 2014-08-05 DIAGNOSIS — C3492 Malignant neoplasm of unspecified part of left bronchus or lung: Secondary | ICD-10-CM

## 2014-08-05 DIAGNOSIS — E860 Dehydration: Secondary | ICD-10-CM | POA: Diagnosis not present

## 2014-08-05 DIAGNOSIS — Z95828 Presence of other vascular implants and grafts: Secondary | ICD-10-CM

## 2014-08-05 DIAGNOSIS — Z79899 Other long term (current) drug therapy: Secondary | ICD-10-CM | POA: Diagnosis not present

## 2014-08-05 DIAGNOSIS — Z006 Encounter for examination for normal comparison and control in clinical research program: Secondary | ICD-10-CM

## 2014-08-05 DIAGNOSIS — E0590 Thyrotoxicosis, unspecified without thyrotoxic crisis or storm: Secondary | ICD-10-CM

## 2014-08-05 DIAGNOSIS — R197 Diarrhea, unspecified: Secondary | ICD-10-CM

## 2014-08-05 DIAGNOSIS — Z87891 Personal history of nicotine dependence: Secondary | ICD-10-CM | POA: Insufficient documentation

## 2014-08-05 LAB — CBC WITH DIFFERENTIAL/PLATELET
BASO%: 0.3 % (ref 0.0–2.0)
BASOS ABS: 0 10*3/uL (ref 0.0–0.1)
EOS ABS: 0.1 10*3/uL (ref 0.0–0.5)
EOS%: 0.7 % (ref 0.0–7.0)
HCT: 32.9 % — ABNORMAL LOW (ref 34.8–46.6)
HGB: 10.9 g/dL — ABNORMAL LOW (ref 11.6–15.9)
LYMPH%: 14.7 % (ref 14.0–49.7)
MCH: 30.3 pg (ref 25.1–34.0)
MCHC: 33.1 g/dL (ref 31.5–36.0)
MCV: 91.4 fL (ref 79.5–101.0)
MONO#: 0.6 10*3/uL (ref 0.1–0.9)
MONO%: 8.5 % (ref 0.0–14.0)
NEUT#: 5.1 10*3/uL (ref 1.5–6.5)
NEUT%: 75.8 % (ref 38.4–76.8)
Platelets: 252 10*3/uL (ref 145–400)
RBC: 3.6 10*6/uL — ABNORMAL LOW (ref 3.70–5.45)
RDW: 14.1 % (ref 11.2–14.5)
WBC: 6.7 10*3/uL (ref 3.9–10.3)
lymph#: 1 10*3/uL (ref 0.9–3.3)

## 2014-08-05 LAB — TSH CHCC: TSH: <0.08 m(IU)/L — ABNORMAL LOW (ref 0.308–3.960)

## 2014-08-05 LAB — COMPREHENSIVE METABOLIC PANEL (CC13)
ALBUMIN: 3.5 g/dL (ref 3.5–5.0)
ALT: 15 U/L (ref 0–55)
AST: 18 U/L (ref 5–34)
Alkaline Phosphatase: 66 U/L (ref 40–150)
Anion Gap: 8 mEq/L (ref 3–11)
BUN: 13.5 mg/dL (ref 7.0–26.0)
CHLORIDE: 98 meq/L (ref 98–109)
CO2: 28 mEq/L (ref 22–29)
Calcium: 9.1 mg/dL (ref 8.4–10.4)
Creatinine: 0.8 mg/dL (ref 0.6–1.1)
EGFR: 75 mL/min/{1.73_m2} — ABNORMAL LOW (ref >90)
Glucose: 92 mg/dl (ref 70–140)
POTASSIUM: 4 meq/L (ref 3.5–5.1)
Sodium: 134 mEq/L — ABNORMAL LOW (ref 136–145)
Total Bilirubin: 0.36 mg/dL (ref 0.20–1.20)
Total Protein: 6.8 g/dL (ref 6.4–8.3)

## 2014-08-05 LAB — CLOSTRIDIUM DIFFICILE BY PCR: Toxigenic C. Difficile by PCR: NEGATIVE

## 2014-08-05 LAB — MAGNESIUM (CC13): MAGNESIUM: 1.2 mg/dL — AB (ref 1.5–2.5)

## 2014-08-05 LAB — T4, FREE: Free T4: 2.03 ng/dL — ABNORMAL HIGH (ref 0.80–1.80)

## 2014-08-05 LAB — PHOSPHORUS: PHOSPHORUS: 3.7 mg/dL (ref 2.5–4.6)

## 2014-08-05 LAB — LACTATE DEHYDROGENASE (CC13): LDH: 185 U/L (ref 125–245)

## 2014-08-05 LAB — T3, FREE: T3, Free: 4.2 pg/mL (ref 2.3–4.2)

## 2014-08-05 MED ORDER — HEPARIN SOD (PORK) LOCK FLUSH 100 UNIT/ML IV SOLN
500.0000 [IU] | INTRAVENOUS | Status: AC | PRN
  Administered 2014-08-05: 500 [IU]
  Filled 2014-08-05: qty 5

## 2014-08-05 MED ORDER — SODIUM CHLORIDE 0.9 % IV SOLN
INTRAVENOUS | Status: AC
  Administered 2014-08-05: 14:00:00 via INTRAVENOUS

## 2014-08-05 MED ORDER — DIPHENOXYLATE-ATROPINE 2.5-0.025 MG PO TABS: 2.0000 | ORAL_TABLET | Freq: Four times a day (QID) | ORAL | Status: DC | PRN

## 2014-08-05 MED ORDER — SODIUM CHLORIDE 0.9 % IV SOLN
2.0000 g | Freq: Once | INTRAVENOUS | Status: AC
  Administered 2014-08-05: 2 g via INTRAVENOUS
  Filled 2014-08-05: qty 4

## 2014-08-05 MED ORDER — SODIUM CHLORIDE 0.9 % IJ SOLN
10.0000 mL | INTRAMUSCULAR | Status: AC | PRN
  Administered 2014-08-05: 10 mL

## 2014-08-05 MED ORDER — SODIUM CHLORIDE 0.9 % IJ SOLN
10.0000 mL | INTRAMUSCULAR | Status: DC | PRN
  Administered 2014-08-05: 10 mL via INTRAVENOUS
  Filled 2014-08-05: qty 10

## 2014-08-05 NOTE — Procedures (Addendum)
[facility]  Procedure Note  Tammie Clay ZMC:[id number] DOB: November 21, 1945 DOA: 08/05/2014   PCP: Mayra Neer, MD   Associated Diagnosis: Lung CA  Procedure Note: Pt receiving IV fluids and 2g Magnesium via Lt chest portacath   Condition During Procedure: Pt a&ox4, pt in no distress. Pt tolerated infusions well.   Condition at Discharge: Pt a&ox4, pt in no distress. Pt tolerated both infusions well. No signs of redness or inflammation at portacath site. Port flushed and de-accessed, dry dressing applied. Pt denies any further needs at time of departure. Pt left ambulatory with husband.   Trixie Rude, RN  [facility]

## 2014-08-05 NOTE — Progress Notes (Signed)
Pt taken to [place] via wheelchair for IV fluids/ magnesium.

## 2014-08-05 NOTE — Progress Notes (Signed)
BMS 370: Group B, Arm A  Patient here today, d/t continuing diarrhea, to see Selena Lesser, NP in symptom management clinic. Patient with continued diarrhea episodes that occur intermittently. Patient reports approximately 2-6 episodes per day, and some days reports more, with minimal effect with use of imodium. Patient reports to have stopped taking lomotil on the 20th after the start of the initial diarrhea, d/t it's ineffectiveness. Patient states she has only been taking imodium since. Patient with no other complaints or symptoms other than what has been reported today in earlier documentation. Per Dr. [name], patient to stop Gabapentin, due to possible s/e for some patient's can be diarrhea and to rule out non-inflammatory causes. Patient also to receive IV fluids with 2 gram magnesium sulfate d/t hypomagnesemia. Patient to be checked for CDiff as well. Patient to return next week for f/u and labs. Patient educated on [employer], keeping hydrated, and per NP to alternate between imodium and lomotil. Teach-back education used. All patient's questions answered to her satisfaction. Patient encouraged to call clinic with questions/concerns and worsening of symptoms. Patient scheduled to return to clinic in one week for f/u. [name], RN, Clinical Research 08/05/2014 2:50 PM

## 2014-08-05 NOTE — Telephone Encounter (Signed)
S/w pt confirming labs/flush/ov per 06/28 POF, s/w Sharyn Lull concerning flush there was no room, she advised that pt may have to be flushed by CB nurse if they were busy in the flush rooms..... KJ

## 2014-08-05 NOTE — Telephone Encounter (Signed)
Cancelled all visits for 06/29 per 06/28 POF, pt is being seen in office now... KJ

## 2014-08-05 NOTE — Telephone Encounter (Signed)
BMS 370: Group B, Arm A Patient called me this morning to report that since she woke up at 0800 this morning, she has had four episodes of diarrhea. Patient states she has taken 2 imodium tabs at the onset and then another for a total of 3 tablets since first episode. Patient states she thinks "there might be some blood, but I'm not sure." I spoke with Dr. [name] and per MD, patient to see Selena Lesser, NP, in Symptom management clinic today. NP informed.  Return call to patient to inform her to expect call from scheduling for appt to see NP today, patient reports having another episode of diarrhea and states "this time when I wiped, I did see pinkish blood." Asked patient to call me if she does not here from scheduling for an appt today.   Onc tx sent.

## 2014-08-06 ENCOUNTER — Encounter: Payer: Self-pay | Admitting: Medical Oncology

## 2014-08-06 ENCOUNTER — Other Ambulatory Visit: Payer: Medicare Other

## 2014-08-06 ENCOUNTER — Ambulatory Visit: Payer: Medicare Other

## 2014-08-06 ENCOUNTER — Ambulatory Visit: Payer: Medicare Other | Admitting: Physician Assistant

## 2014-08-06 ENCOUNTER — Telehealth: Payer: Self-pay | Admitting: *Deleted

## 2014-08-06 DIAGNOSIS — C3492 Malignant neoplasm of unspecified part of left bronchus or lung: Secondary | ICD-10-CM

## 2014-08-06 NOTE — Telephone Encounter (Signed)
LM for pt to rtn call- Pt needs to contact PCP about hyperthyroid.  Pt was advised to stop taking gabapentin on 6/28- pt needs to decrease dose and taper off instead of stopping. She may decrease her dose of '900mg'$  to '600mg'$  for a few days then '300mg'$  for a few days before stopping.

## 2014-08-06 NOTE — Progress Notes (Signed)
BMS 370: Group B, Arm A I was informed by pharmacy that patient needs to be tapered from Gabapentin gradually over a week. Discussed with Dr. [name] new information received from pharmacy as well as Dr. [name] is referring patient to see her PCP due to TSH levels outside of normal limits and for management. I called patient to confirm that she had received message from NP, Cyndee Bacon's desk nurse regarding tapering of Gabapentin and TSH results and patient stated she had not. I gave patient instructions on tapering of gabapentin and per pharmacy, patient to take 600 mg of gabapentin today and Thursday, then drop to 300 mg of gabapentin for Friday and Saturday and then to take 300 mg every other day till Wednesday, in which she will be down to taking none on Wednesday. Patient read back instructions to me and gave verbal understanding. Patient was given her TSH, Free T3 and Free T4 results and she states she will contact her PCP, Dr. Brigitte Pulse to schedule an appointment with her as soon as she can. Patient was also informed of negative Cdifficile result. Patient reported that today she has had only 1 episode of diarrhea earlier in the morning and has been taking the imodium and lomotil as instructed and has no new symptoms or issues. Patient knows to call clinic with questions/concerns. Return appt scheduled July 6th.   Patient did request that her labs and office notes be faxed to her PCP so that PCP is aware of TSH results. Mssg sent to Dr. Worthy Flank desk nurse for patient's request.   [name], RN, Clinical Research 08/06/2014 5:32 PM

## 2014-08-07 ENCOUNTER — Other Ambulatory Visit: Payer: Self-pay | Admitting: Medical Oncology

## 2014-08-07 DIAGNOSIS — C3492 Malignant neoplasm of unspecified part of left bronchus or lung: Secondary | ICD-10-CM

## 2014-08-08 ENCOUNTER — Telehealth: Payer: Self-pay | Admitting: Medical Oncology

## 2014-08-08 NOTE — Telephone Encounter (Signed)
BMS 370: Group B, Arm A Follow up call to patient to inquire how she is doing. Spoke with patient's significant other, Agricultural engineer. [name] states that Tammie Clay was out, went to [employer], but he does report that her diarrhea episodes have decreased to less then four per day and yesterday she only had 3 episodes and today had one BM, which was her regular consistency, denies any other symptoms or new symptoms. I encouraged them to call clinic with any questions and concerns and reminded him that I will be out next week and my colleague, Marcellus Scott, will be following up with them during patient's scheduled appointment on the 6th. Gina's office number provided to Mr. Gaetana Michaelis. He denies any questions at this time. [name], RN, Clinical Research 08/08/2014 1:40 PM

## 2014-08-11 ENCOUNTER — Encounter: Payer: Self-pay | Admitting: Nurse Practitioner

## 2014-08-11 DIAGNOSIS — E0590 Thyrotoxicosis, unspecified without thyrotoxic crisis or storm: Secondary | ICD-10-CM | POA: Insufficient documentation

## 2014-08-11 NOTE — Assessment & Plan Note (Signed)
Magnesium down to 1.2 today; most likely secondary to chronic diarrhea.  Pt given magnesium 2 grams IV today.

## 2014-08-11 NOTE — Assessment & Plan Note (Signed)
C/o chronic diarrhea for past several days. Feels dehydrated.  Pt received IV fluid rehydration today; and was encouraged to push fluids at home.

## 2014-08-11 NOTE — Progress Notes (Signed)
SYMPTOM MANAGEMENT CLINIC   HPI: Tammie Clay 69 y.o. female diagnosed with lung cancer; and currently undergoing Nivolumab clinical trial.    Pt c/o chronic diarrhea between 4-6 times per day for past several days; despite taking Imodium. Pt states that she has noticed some blood in her stool recently.  Pt states that she tried Lomotil as well- but that it did not help. C-diff stool was negative.   Of note- pt states that she recently increased her gabapentin from 600 mg to 900 mg for tx of her neuropathy.    Also, TSH was less than 0.080 today; and could also be causing some chronic diarrhea.   HPI  ROS  Past Medical History  Diagnosis Date  . Hypertension   . COPD (chronic obstructive pulmonary disease)   . Hyperlipidemia   . GERD (gastroesophageal reflux disease)   . Osteopenia   . Insomnia   . Mediastinal lymphadenopathy 11/19/13    PER CT  . Multiple lung nodules on CT 11/19/13    LEFT LUNG BASE  . Lung mass 11/19/13    RUL PER CT  . Coronary artery calcification seen on CAT scan 11/21/2013  . COPD (chronic obstructive pulmonary disease) 11/21/2013  . Pneumonia     hx of  . Anxiety     Past Surgical History  Procedure Laterality Date  .  c section x 2    . Urethral cyst removed    . Tonsillectomy    . Appendectomy    . Colonoscopy with propofol N/A 07/17/2012    Procedure: COLONOSCOPY WITH PROPOFOL;  Surgeon: Garlan Fair, MD;  Location: [facility];  Service: Endoscopy;  Laterality: N/A;  . Esophagogastroduodenoscopy (egd) with propofol N/A 07/17/2012    Procedure: ESOPHAGOGASTRODUODENOSCOPY (EGD) WITH PROPOFOL;  Surgeon: Garlan Fair, MD;  Location: [facility];  Service: Endoscopy;  Laterality: N/A;  . Video bronchoscopy with endobronchial ultrasound N/A 12/03/2013    Procedure: VIDEO BRONCHOSCOPY WITH ENDOBRONCHIAL ULTRASOUND WITH BIOPSIES;  Surgeon: Grace Isaac, MD;  Location: [address];  Service: Thoracic;  Laterality: N/A;  . Portacath  placement Left 12/10/2013    Procedure: INSERTION PORT-A-CATH;  Surgeon: Grace Isaac, MD;  Location: [place];  Service: Thoracic;  Laterality: Left;    has Hyperlipidemia; GERD (gastroesophageal reflux disease); Osteopenia; Left Upper Lobe mass, 4cm; Mediastinal lymphadenopathy; Coronary artery calcification seen on CAT scan; COPD (chronic obstructive pulmonary disease); Stage IV squamous cell carcinoma of left lung; Neoplasm related pain; Rectal bleeding; Chemotherapy induced neutropenia; Weakness; Insomnia; Bronchitis; Antineoplastic chemotherapy induced anemia; Constipation; Chemotherapy-induced neuropathy; Diarrhea; Dehydration; Hypomagnesemia; and Hyperthyroidism on her problem list.    has No Known Allergies.    Medication List       This list is accurate as of: 08/05/14 11:59 PM.  Always use your most recent med list.               acetaminophen 325 MG tablet  Commonly known as:  TYLENOL  Take 650 mg by mouth daily as needed for moderate pain or headache. Take 2 tablets every morning     albuterol 108 (90 BASE) MCG/ACT inhaler  Commonly known as:  PROVENTIL HFA;VENTOLIN HFA  Inhale 2 puffs into the lungs every 6 (six) hours as needed for wheezing.     calcium carbonate 500 MG chewable tablet  Commonly known as:  TUMS - dosed in mg elemental calcium  Chew 1 tablet by mouth daily as needed for indigestion or heartburn.  diazepam 5 MG tablet  Commonly known as:  VALIUM  Take 5 mg by mouth at bedtime.     diphenoxylate-atropine 2.5-0.025 MG per tablet  Commonly known as:  LOMOTIL  Take 2 tablets by mouth 4 (four) times daily as needed for diarrhea or loose stools.     docusate sodium 100 MG capsule  Commonly known as:  COLACE  Take 100 mg by mouth 2 (two) times daily as needed for moderate constipation.     DULoxetine 30 MG capsule  Commonly known as:  CYMBALTA  TAKE 1 CAPSULE (30 MG TOTAL) BY MOUTH DAILY.     gabapentin 300 MG capsule  Commonly known as:   NEURONTIN  Take 1 capsule (300 mg total) by mouth 3 (three) times daily.     HYDROcodone-acetaminophen 10-325 MG per tablet  Commonly known as:  NORCO  Take 1 tablet by mouth every 6 (six) hours as needed for moderate pain or severe pain.     lidocaine-prilocaine cream  Commonly known as:  EMLA  Apply 1 application topically as needed.     lisinopril-hydrochlorothiazide 20-12.5 MG per tablet  Commonly known as:  PRINZIDE,ZESTORETIC  Take 2 tablets by mouth every morning.     loperamide 2 MG capsule  Commonly known as:  IMODIUM  Take by mouth as needed for diarrhea or loose stools.     loratadine 10 MG tablet  Commonly known as:  CLARITIN  Take 10 mg by mouth daily.     magnesium oxide 400 (241.3 MG) MG tablet  Commonly known as:  MAG-OX  Take 1 tablet (400 mg total) by mouth 2 (two) times daily.     metoprolol succinate 50 MG 24 hr tablet  Commonly known as:  TOPROL-XL  Take 50 mg by mouth every morning. Take with or immediately following a meal.     multivitamin with minerals Tabs tablet  Take 1 tablet by mouth daily.     polyethylene glycol packet  Commonly known as:  MIRALAX / GLYCOLAX  Take 17 g by mouth daily as needed for moderate constipation or severe constipation.     potassium chloride SA 20 MEQ tablet  Commonly known as:  K-DUR,KLOR-CON  Take 1 tablet (20 mEq total) by mouth daily.     prochlorperazine 10 MG tablet  Commonly known as:  COMPAZINE  Take 1 tablet (10 mg total) by mouth every 6 (six) hours as needed for nausea or vomiting.     ranitidine 150 MG tablet  Commonly known as:  ZANTAC  Take 150 mg by mouth 2 (two) times daily as needed for heartburn.     SYMBICORT 80-4.5 MCG/ACT inhaler  Generic drug:  budesonide-formoterol  Inhale 2 puffs into the lungs 2 (two) times daily.         PHYSICAL EXAMINATION  Oncology Vitals 07/30/2014 07/19/2014 07/19/2014 07/16/2014 07/14/2014 07/03/2014 07/02/2014  Height 158 cm - - 158 cm - 158 cm 158 cm  Weight  75.615 kg - - 75.66 kg 75.66 kg 76.839 kg 77.248 kg  Weight (lbs) 166 lbs 11 oz - - 166 lbs 13 oz 166 lbs 13 oz 169 lbs 6 oz 170 lbs 5 oz  BMI (kg/m2) 30.49 kg/m2 - - 30.51 kg/m2 - 30.98 kg/m2 31.15 kg/m2  Temp 98.4 - 97.8 98.3 97.8 98.1 97.9  Pulse 85 79 94 94 76 104 89  Resp 18 - _0 SpO2 96 - 99 98 99 98 100  BSA (m2) 1.82 m2 - -  1.82 m2 - 1.83 m2 1.84 m2   BP Readings from Last 3 Encounters:  07/30/14 141/74  07/19/14 130/83  07/16/14 130/77    Physical Exam  Constitutional: She is oriented to person, place, and time and well-developed, well-nourished, and in no distress.  HENT:  Head: Normocephalic and atraumatic.  Eyes: Conjunctivae and EOM are normal. Pupils are equal, round, and reactive to light. Right eye exhibits no discharge. Left eye exhibits no discharge. No scleral icterus.  Neck: Normal range of motion.  Pulmonary/Chest: Effort normal. No respiratory distress.  Musculoskeletal: Normal range of motion. She exhibits no edema or tenderness.  Neurological: She is alert and oriented to person, place, and time. Gait normal.  Skin: Skin is warm and dry.  Psychiatric: Affect normal.  Nursing note and vitals reviewed.   LABORATORY DATA:. Hospital Outpatient Visit on 08/05/2014  Component Date Value Ref Range Status  . Phosphorus 08/05/2014 3.7  2.5 - 4.6 mg/dL Final  . C difficile by pcr 08/05/2014 NEGATIVE  NEGATIVE Final  Appointment on 08/05/2014  Component Date Value Ref Range Status  . WBC 08/05/2014 6.7  3.9 - 10.3 10e3/uL Final  . NEUT# 08/05/2014 5.1  1.5 - 6.5 10e3/uL Final  . HGB 08/05/2014 10.9* 11.6 - 15.9 g/dL Final  . HCT 08/05/2014 32.9* 34.8 - 46.6 % Final  . Platelets 08/05/2014 252  145 - 400 10e3/uL Final  . MCV 08/05/2014 91.4  79.5 - 101.0 fL Final  . MCH 08/05/2014 30.3  25.1 - 34.0 pg Final  . MCHC 08/05/2014 33.1  31.5 - 36.0 g/dL Final  . RBC 08/05/2014 3.60* 3.70 - 5.45 10e6/uL Final  . RDW 08/05/2014 14.1  11.2 - 14.5 % Final    . lymph# 08/05/2014 1.0  0.9 - 3.3 10e3/uL Final  . MONO# 08/05/2014 0.6  0.1 - 0.9 10e3/uL Final  . Eosinophils Absolute 08/05/2014 0.1  0.0 - 0.5 10e3/uL Final  . Basophils Absolute 08/05/2014 0.0  0.0 - 0.1 10e3/uL Final  . NEUT% 08/05/2014 75.8  38.4 - 76.8 % Final  . LYMPH% 08/05/2014 14.7  14.0 - 49.7 % Final  . MONO% 08/05/2014 8.5  0.0 - 14.0 % Final  . EOS% 08/05/2014 0.7  0.0 - 7.0 % Final  . BASO% 08/05/2014 0.3  0.0 - 2.0 % Final  . TSH 08/05/2014 <0.080 Repeated and Verified* 0.308 - 3.960 m(IU)/L Final  . LDH 08/05/2014 185  125 - 245 U/L Final  . Sodium 08/05/2014 134* 136 - 145 mEq/L Final  . Potassium 08/05/2014 4.0  3.5 - 5.1 mEq/L Final  . Chloride 08/05/2014 98  98 - 109 mEq/L Final  . CO2 08/05/2014 28  22 - 29 mEq/L Final  . Glucose 08/05/2014 92  70 - 140 mg/dl Final  . BUN 08/05/2014 13.5  7.0 - 26.0 mg/dL Final  . Creatinine 08/05/2014 0.8  0.6 - 1.1 mg/dL Final  . Total Bilirubin 08/05/2014 0.36  0.20 - 1.20 mg/dL Final  . Alkaline Phosphatase 08/05/2014 66  40 - 150 U/L Final  . AST 08/05/2014 18  5 - 34 U/L Final  . ALT 08/05/2014 15  0 - 55 U/L Final  . Total Protein 08/05/2014 6.8  6.4 - 8.3 g/dL Final  . Albumin 08/05/2014 3.5  3.5 - 5.0 g/dL Final  . Calcium 08/05/2014 9.1  8.4 - 10.4 mg/dL Final  . Anion Gap 08/05/2014 8  3 - 11 mEq/L Final  . EGFR 08/05/2014 75* >90 ml/min/1.73 m2 Final   eGFR  is calculated using the CKD-EPI Creatinine Equation (2009)  . Magnesium 08/05/2014 1.2* 1.5 - 2.5 mg/dl Final  . Free T4 08/05/2014 2.03* 0.80 - 1.80 ng/dL Final  . T3, Free 08/05/2014 4.2  2.3 - 4.2 pg/mL Final     RADIOGRAPHIC STUDIES: No results found.  ASSESSMENT/PLAN:    Dehydration C/o chronic diarrhea for past several days. Feels dehydrated.  Pt received IV fluid rehydration today; and was encouraged to push fluids at home.   Diarrhea Pt c/o chronic diarrhea between 4-6 times per day for past several days; despite taking Imodium. Pt states  that she has noticed some blood in her stool recently.  Pt states that she tried Lomotil as well- but that it did not help. C-diff stool was negative.   Of note- pt states that she recently increased her gabapentin from 600 mg to 900 mg for tx of her neuropathy.    Also, TSH was less than 0.080 today; and could also be causing some chronic diarrhea.   Pt was given new prescription for lomotil; and advised to alternate with both lomotil and imodium to see if that helps.   Also, advised that pt slowly taper the gabapentin till completely off- since pt states that the gabapentin has made no difference in her neuropathy symptoms.   Pt also advised to follow up with her PCP as soon as possible to manage questionable newly dx'd hyperthyroidism- which can cause diarrhea as well.       Hyperthyroidism TSH decreased to less than 0.080.  Hyperthyroidism can cause diarrhea.  Pt was advised to follow up with her PCP as soon as possible.   Hypomagnesemia Magnesium down to 1.2 today; most likely secondary to chronic diarrhea.  Pt given magnesium 2 grams IV today.    Stage IV squamous cell carcinoma of left lung Pt received her last cycle of clinical trial Nivolumab on 07/16/14.  Blood counts remain stable; but pt continues with chronic diarrhea.  Per study protocol- will hold Nivolumab for 1 additional week to see if tapering gabapentin and treating possible hyperthyroidism improves diarrhea symptoms.   Pt to return 7/6 for labs/visit/and ? Infusion or Nivolumab.    Patient stated understanding of all instructions; and was in agreement with this plan of care. The patient knows to call the clinic with any problems, questions or concerns.   This was a shared visit with Dr. [name].   Total time spent with patient was 25 minutes;  with greater than 75 percent of that time spent in face to face counseling regarding patient's symptoms,  and coordination of care and follow up.  Disclaimer: This note was  dictated with voice recognition software. Similar sounding words can inadvertently be transcribed and may not be corrected upon review.   Tammie Second, NP 08/11/2014   ADDENDUM: Hematology/Oncology Attending: I had a face to face encounter with the patient. I recommended her care plan. This is a very pleasant 69 years old white female with metastatic non-small cell lung cancer, squamous cell carcinoma was currently undergoing maintenance treatment with immunotherapy with Nivolumab status post 6 cycles. The patient has been tolerating her treatment fairly well but recently she had several episodes of diarrhea. Her treatment has been on hold for the last few weeks until resolution of the diarrhea. She was also noted to have hyperthyroidism which probably could be adding to the etiology of the diarrhea. The patient also noted that her diarrhea increased after her dose of gabapentin was increased last week. We  advised her to decrease the dose of gabapentin slowly. We will monitor her over the next 7 days and if she has improvement in her diarrhea, she will resume her treatment with immunotherapy otherwise we will start the patient on high-dose prednisone. For the hyperthyroidism, we will referred the patient to her primary care physician for reevaluation and management of her condition. She would come back for follow-up visit in one week for reevaluation. She was advised to call immediately if she has any concerning symptoms in the interval.  Disclaimer: This note was dictated with voice recognition software. Similar sounding words can inadvertently be transcribed and may be missed upon review. Eilleen Kempf., MD 08/12/2014

## 2014-08-11 NOTE — Assessment & Plan Note (Signed)
TSH decreased to less than 0.080.  Hyperthyroidism can cause diarrhea.  Pt was advised to follow up with her PCP as soon as possible.

## 2014-08-11 NOTE — Assessment & Plan Note (Addendum)
Pt c/o chronic diarrhea between 4-6 times per day for past several days; despite taking Imodium. Pt states that she has noticed some blood in her stool recently.  Pt states that she tried Lomotil as well- but that it did not help. C-diff stool was negative.   Of note- pt states that she recently increased her gabapentin from 600 mg to 900 mg for tx of her neuropathy.    Also, TSH was less than 0.080 today; and could also be causing some chronic diarrhea.   Pt was given new prescription for lomotil; and advised to alternate with both lomotil and imodium to see if that helps.   Also, advised that pt slowly taper the gabapentin till completely off- since pt states that the gabapentin has made no difference in her neuropathy symptoms.   Pt also advised to follow up with her PCP as soon as possible to manage questionable newly dx'd hyperthyroidism- which can cause diarrhea as well.

## 2014-08-11 NOTE — Assessment & Plan Note (Signed)
Pt received her last cycle of clinical trial Nivolumab on 07/16/14.  Blood counts remain stable; but pt continues with chronic diarrhea.  Per study protocol- will hold Nivolumab for 1 additional week to see if tapering gabapentin and treating possible hyperthyroidism improves diarrhea symptoms.   Pt to return 7/6 for labs/visit/and ? Infusion or Nivolumab.

## 2014-08-13 ENCOUNTER — Other Ambulatory Visit (HOSPITAL_BASED_OUTPATIENT_CLINIC_OR_DEPARTMENT_OTHER): Payer: Medicare Other

## 2014-08-13 ENCOUNTER — Telehealth: Payer: Self-pay | Admitting: *Deleted

## 2014-08-13 ENCOUNTER — Other Ambulatory Visit (HOSPITAL_COMMUNITY)
Admission: RE | Admit: 2014-08-13 | Discharge: 2014-08-13 | Disposition: A | Discharge status: Home / Self Care | Payer: Medicare Other | Source: Ambulatory Visit | Attending: Internal Medicine | Admitting: Internal Medicine

## 2014-08-13 ENCOUNTER — Encounter: Payer: Self-pay | Admitting: Internal Medicine

## 2014-08-13 ENCOUNTER — Telehealth: Payer: Self-pay | Admitting: Internal Medicine

## 2014-08-13 ENCOUNTER — Ambulatory Visit: Payer: Medicare Other | Admitting: Nutrition

## 2014-08-13 ENCOUNTER — Ambulatory Visit (HOSPITAL_BASED_OUTPATIENT_CLINIC_OR_DEPARTMENT_OTHER): Payer: Medicare Other | Admitting: Internal Medicine

## 2014-08-13 ENCOUNTER — Ambulatory Visit (HOSPITAL_BASED_OUTPATIENT_CLINIC_OR_DEPARTMENT_OTHER): Payer: Medicare Other

## 2014-08-13 ENCOUNTER — Encounter: Payer: Self-pay | Admitting: *Deleted

## 2014-08-13 VITALS — BP 116/83 | HR 81 | Temp 98.2°F | Resp 18 | Ht 62.0 in | Wt 157.1 lb

## 2014-08-13 DIAGNOSIS — C3412 Malignant neoplasm of upper lobe, left bronchus or lung: Secondary | ICD-10-CM

## 2014-08-13 DIAGNOSIS — Z5112 Encounter for antineoplastic immunotherapy: Secondary | ICD-10-CM

## 2014-08-13 DIAGNOSIS — E0590 Thyrotoxicosis, unspecified without thyrotoxic crisis or storm: Secondary | ICD-10-CM

## 2014-08-13 DIAGNOSIS — Z006 Encounter for examination for normal comparison and control in clinical research program: Secondary | ICD-10-CM

## 2014-08-13 DIAGNOSIS — Z79899 Other long term (current) drug therapy: Secondary | ICD-10-CM

## 2014-08-13 DIAGNOSIS — C3492 Malignant neoplasm of unspecified part of left bronchus or lung: Secondary | ICD-10-CM | POA: Insufficient documentation

## 2014-08-13 DIAGNOSIS — G620 Drug-induced polyneuropathy: Secondary | ICD-10-CM | POA: Diagnosis not present

## 2014-08-13 DIAGNOSIS — Z87891 Personal history of nicotine dependence: Secondary | ICD-10-CM | POA: Diagnosis not present

## 2014-08-13 DIAGNOSIS — R197 Diarrhea, unspecified: Secondary | ICD-10-CM

## 2014-08-13 DIAGNOSIS — T451X5A Adverse effect of antineoplastic and immunosuppressive drugs, initial encounter: Secondary | ICD-10-CM

## 2014-08-13 LAB — CBC WITH DIFFERENTIAL/PLATELET
BASO%: 0.7 % (ref 0.0–2.0)
Basophils Absolute: 0.1 10*3/uL (ref 0.0–0.1)
EOS ABS: 0.1 10*3/uL (ref 0.0–0.5)
EOS%: 0.8 % (ref 0.0–7.0)
HCT: 36.3 % (ref 34.8–46.6)
HGB: 12 g/dL (ref 11.6–15.9)
LYMPH%: 12.4 % — ABNORMAL LOW (ref 14.0–49.7)
MCH: 29.6 pg (ref 25.1–34.0)
MCHC: 32.9 g/dL (ref 31.5–36.0)
MCV: 89.9 fL (ref 79.5–101.0)
MONO#: 0.6 10*3/uL (ref 0.1–0.9)
MONO%: 6.7 % (ref 0.0–14.0)
NEUT%: 79.4 % — ABNORMAL HIGH (ref 38.4–76.8)
NEUTROS ABS: 6.9 10*3/uL — AB (ref 1.5–6.5)
Platelets: 338 10*3/uL (ref 145–400)
RBC: 4.04 10*6/uL (ref 3.70–5.45)
RDW: 15.3 % — AB (ref 11.2–14.5)
WBC: 8.7 10*3/uL (ref 3.9–10.3)
lymph#: 1.1 10*3/uL (ref 0.9–3.3)

## 2014-08-13 LAB — COMPREHENSIVE METABOLIC PANEL (CC13)
ALK PHOS: 65 U/L (ref 40–150)
ALT: 25 U/L (ref 0–55)
AST: 19 U/L (ref 5–34)
Albumin: 3.7 g/dL (ref 3.5–5.0)
Anion Gap: 11 mEq/L (ref 3–11)
BILIRUBIN TOTAL: 0.36 mg/dL (ref 0.20–1.20)
BUN: 18.1 mg/dL (ref 7.0–26.0)
CO2: 28 mEq/L (ref 22–29)
CREATININE: 1.1 mg/dL (ref 0.6–1.1)
Calcium: 9.8 mg/dL (ref 8.4–10.4)
Chloride: 100 mEq/L (ref 98–109)
EGFR: 51 mL/min/{1.73_m2} — ABNORMAL LOW (ref >90)
Glucose: 102 mg/dl (ref 70–140)
Potassium: 3.4 mEq/L — ABNORMAL LOW (ref 3.5–5.1)
Sodium: 140 mEq/L (ref 136–145)
Total Protein: 7.4 g/dL (ref 6.4–8.3)

## 2014-08-13 LAB — MAGNESIUM (CC13): Magnesium: 1.3 mg/dl — CL (ref 1.5–2.5)

## 2014-08-13 LAB — PHOSPHORUS: Phosphorus: 3.4 mg/dL (ref 2.5–4.6)

## 2014-08-13 LAB — T4, FREE: Free T4: 2 ng/dL — ABNORMAL HIGH (ref 0.80–1.80)

## 2014-08-13 LAB — LACTATE DEHYDROGENASE (CC13): LDH: 198 U/L (ref 125–245)

## 2014-08-13 LAB — TSH CHCC

## 2014-08-13 MED ORDER — HEPARIN SOD (PORK) LOCK FLUSH 100 UNIT/ML IV SOLN
500.0000 [IU] | Freq: Once | INTRAVENOUS | Status: AC | PRN
  Administered 2014-08-13: 500 [IU]
  Filled 2014-08-13: qty 5

## 2014-08-13 MED ORDER — SODIUM CHLORIDE 0.9 % IJ SOLN
10.0000 mL | INTRAMUSCULAR | Status: DC | PRN
  Administered 2014-08-13: 10 mL
  Filled 2014-08-13: qty 10

## 2014-08-13 MED ORDER — SODIUM CHLORIDE 0.9 % IV SOLN
2.0000 g | Freq: Once | INTRAVENOUS | Status: DC
  Administered 2014-08-13: 2 g via INTRAVENOUS
  Filled 2014-08-13: qty 4

## 2014-08-13 MED ORDER — SODIUM CHLORIDE 0.9 % IV SOLN
250.0000 mL | Freq: Once | INTRAVENOUS | Status: AC
  Administered 2014-08-13: 250 mL via INTRAVENOUS

## 2014-08-13 MED ORDER — SODIUM CHLORIDE 0.9 % IV SOLN
240.0000 mg | Freq: Once | INTRAVENOUS | Status: AC
  Administered 2014-08-13: 240 mg via INTRAVENOUS
  Filled 2014-08-13: qty 24

## 2014-08-13 NOTE — Progress Notes (Signed)
Treatment parameters verified with Marcellus Scott, research RN at [facility]. OK to infuse magnesium over 2 hours prior to nivolumab per Pharmacy. Per research RN, protocol does not specify sequencing for electrolyte replacement.

## 2014-08-13 NOTE — Progress Notes (Signed)
Research nurse requested nutrition samples for patient that were clear liquid. Provided ensure clear and boost breeze samples. Educated patient on where to purchase samples. Provided fact sheets on increasing calories and protein and diarrhea.

## 2014-08-13 NOTE — Progress Notes (Addendum)
[facility] [phone]   Fax:([phone]) [place] [place] [address]  DIAGNOSIS: Stage IV (T2, N2, M1a) non-small cell lung cancer, squamous cell carcinoma presented with central left upper lobe lung mass with associated postobstructive atelectasis as well as ipsilateral and subcarinal lymphadenopathy as well as pleural tumor spread diagnosed in October 2015.  PRIOR THERAPY: Systemic chemotherapy with carboplatin for AUC of 5 on day 1 and Abraxane 100 MG/M2 on days 1, 8 and 15 every 3 weeks. Status post 6 cycles.   CURRENT THERAPY: Nivolumab per research protocol BMS 370 group B, arm A, given every 2 weeks. Status post 6 cycles  INTERVAL HISTORY: Tammie Clay 69 y.o. female returns to the clinic today for follow-up visit accompanied by her significant other. She is currently undergoing treatment with immunotherapy with Nivolumab 240 mg fixed dose every 2 weeks is status post 6 cycles. Her treatment has been on hold for the last few weeks secondary to persistent diarrhea with unclear etiology which could be related to her treatment with magnesium oxide versus gabapentin versus hyperthyroidism versus Nivolumab. Her diarrhea has significantly improved over the last week but today she had 3 more loose stool. She was also seen by her endocrinologist for the questionable hyperthyroidism. He ordered several lab studies that were collected today. She denied having any significant fever or chills. The patient denied having any significant chest pain, shortness of breath, cough or hemoptysis. She has no nausea or vomiting. She lost more than 10 pound recently secondary to her persistent diarrhea over the last 2 weeks. She is here today to resume her treatment with immunotherapy.  MEDICAL HISTORY: Past Medical History  Diagnosis Date  . Hypertension   . COPD (chronic obstructive pulmonary disease)     . Hyperlipidemia   . GERD (gastroesophageal reflux disease)   . Osteopenia   . Insomnia   . Mediastinal lymphadenopathy 11/19/13    PER CT  . Multiple lung nodules on CT 11/19/13    LEFT LUNG BASE  . Lung mass 11/19/13    RUL PER CT  . Coronary artery calcification seen on CAT scan 11/21/2013  . COPD (chronic obstructive pulmonary disease) 11/21/2013  . Pneumonia     hx of  . Anxiety     ALLERGIES:  has No Known Allergies.  MEDICATIONS:  Current Outpatient Prescriptions  Medication Sig Dispense Refill  . acetaminophen (TYLENOL) 325 MG tablet Take 650 mg by mouth daily as needed for moderate pain or headache. Take 2 tablets every morning    . albuterol (PROVENTIL HFA;VENTOLIN HFA) 108 (90 BASE) MCG/ACT inhaler Inhale 2 puffs into the lungs every 6 (six) hours as needed for wheezing.    . calcium carbonate (TUMS - DOSED IN MG ELEMENTAL CALCIUM) 500 MG chewable tablet Chew 1 tablet by mouth daily as needed for indigestion or heartburn.     . diazepam (VALIUM) 5 MG tablet Take 5 mg by mouth at bedtime.    . diphenoxylate-atropine (LOMOTIL) 2.5-0.025 MG per tablet Take 2 tablets by mouth 4 (four) times daily as needed for diarrhea or loose stools. 45 tablet 0  . docusate sodium (COLACE) 100 MG capsule Take 100 mg by mouth 2 (two) times daily as needed for moderate constipation.     . DULoxetine (CYMBALTA) 30 MG capsule TAKE 1 CAPSULE (30 MG TOTAL) BY MOUTH DAILY. 30 capsule 2  . HYDROcodone-acetaminophen (NORCO) 10-325 MG  per tablet Take 1 tablet by mouth every 6 (six) hours as needed for moderate pain or severe pain.     Marland Kitchen lidocaine-prilocaine (EMLA) cream Apply 1 application topically as needed. 30 g 0  . lisinopril-hydrochlorothiazide (PRINZIDE,ZESTORETIC) 20-12.5 MG per tablet Take 2 tablets by mouth every morning.     . loperamide (IMODIUM) 2 MG capsule Take by mouth as needed for diarrhea or loose stools.    Marland Kitchen loratadine (CLARITIN) 10 MG tablet Take 10 mg by mouth daily.    .  magnesium oxide (MAG-OX) 400 (241.3 MG) MG tablet Take 1 tablet (400 mg total) by mouth 2 (two) times daily. 60 tablet 0  . metoprolol succinate (TOPROL-XL) 50 MG 24 hr tablet Take 50 mg by mouth every morning. Take with or immediately following a meal.    . Multiple Vitamin (MULTIVITAMIN WITH MINERALS) TABS Take 1 tablet by mouth daily.    . polyethylene glycol (MIRALAX / GLYCOLAX) packet Take 17 g by mouth daily as needed for moderate constipation or severe constipation.     . prochlorperazine (COMPAZINE) 10 MG tablet Take 1 tablet (10 mg total) by mouth every 6 (six) hours as needed for nausea or vomiting. 30 tablet 1  . ranitidine (ZANTAC) 150 MG tablet Take 150 mg by mouth 2 (two) times daily as needed for heartburn.     . SYMBICORT 80-4.5 MCG/ACT inhaler Inhale 2 puffs into the lungs 2 (two) times daily.   3   No current facility-administered medications for this visit.    SURGICAL HISTORY:  Past Surgical History  Procedure Laterality Date  .  c section x 2    . Urethral cyst removed    . Tonsillectomy    . Appendectomy    . Colonoscopy with propofol N/A 07/17/2012    Procedure: COLONOSCOPY WITH PROPOFOL;  Surgeon: Garlan Fair, MD;  Location: [facility];  Service: Endoscopy;  Laterality: N/A;  . Esophagogastroduodenoscopy (egd) with propofol N/A 07/17/2012    Procedure: ESOPHAGOGASTRODUODENOSCOPY (EGD) WITH PROPOFOL;  Surgeon: Garlan Fair, MD;  Location: [facility];  Service: Endoscopy;  Laterality: N/A;  . Video bronchoscopy with endobronchial ultrasound N/A 12/03/2013    Procedure: VIDEO BRONCHOSCOPY WITH ENDOBRONCHIAL ULTRASOUND WITH BIOPSIES;  Surgeon: Grace Isaac, MD;  Location: [facility];  Service: Thoracic;  Laterality: N/A;  . Portacath placement Left 12/10/2013    Procedure: INSERTION PORT-A-CATH;  Surgeon: Grace Isaac, MD;  Location: [facility];  Service: Thoracic;  Laterality: Left;    REVIEW OF SYSTEMS:  Constitutional: negative Eyes: negative Ears, nose,  mouth, throat, and face: negative Respiratory: negative Cardiovascular: negative Gastrointestinal: positive for diarrhea Genitourinary:negative Integument/breast: negative Hematologic/lymphatic: negative Musculoskeletal:negative Neurological: positive for paresthesia Behavioral/Psych: negative Endocrine: negative Allergic/Immunologic: negative   PHYSICAL EXAMINATION: General appearance: alert, cooperative, fatigued and no distress Head: Normocephalic, without obvious abnormality, atraumatic Neck: no adenopathy, no JVD, supple, symmetrical, trachea midline and thyroid not enlarged, symmetric, no tenderness/mass/nodules Lymph nodes: Cervical, supraclavicular, and axillary nodes normal. Resp: clear to auscultation bilaterally Back: symmetric, no curvature. ROM normal. No CVA tenderness. Cardio: regular rate and rhythm, S1, S2 normal, no murmur, click, rub or gallop GI: soft, non-tender; bowel sounds normal; no masses,  no organomegaly Genitalia: defer exam Extremities: extremities normal, atraumatic, no cyanosis or edema Neurologic: Alert and oriented X 3, normal strength and tone. Normal symmetric reflexes. Normal coordination and gait  ECOG PERFORMANCE STATUS: 1 - Symptomatic but completely ambulatory  Blood pressure 116/83, pulse 81, temperature 98.2 F (36.8 C), temperature source  Oral, resp. rate 18, height '5\' 2"'$  (1.575 m), weight 157 lb 1.6 oz (71.26 kg), SpO2 99 %.  LABORATORY DATA: Lab Results  Component Value Date   WBC 8.7 08/13/2014   HGB 12.0 08/13/2014   HCT 36.3 08/13/2014   MCV 89.9 08/13/2014   PLT 338 08/13/2014      Chemistry      Component Value Date/Time   NA 134* 08/05/2014 1107   NA 135 07/19/2014 1607   K 4.0 08/05/2014 1107   K 3.4* 07/19/2014 1607   CL 99* 07/19/2014 1607   CO2 28 08/05/2014 1107   CO2 28 07/19/2014 1607   BUN 13.5 08/05/2014 1107   BUN 21* 07/19/2014 1607   CREATININE 0.8 08/05/2014 1107   CREATININE 1.01* 07/19/2014 1607    CREATININE 0.80 11/22/2013 1054      Component Value Date/Time   CALCIUM 9.1 08/05/2014 1107   CALCIUM 8.7* 07/19/2014 1607   ALKPHOS 66 08/05/2014 1107   ALKPHOS 77 12/10/2013 0637   AST 18 08/05/2014 1107   AST 16 12/10/2013 0637   ALT 15 08/05/2014 1107   ALT 16 12/10/2013 0637   BILITOT 0.36 08/05/2014 1107   BILITOT 0.4 12/10/2013 0637       RADIOGRAPHIC STUDIES: No results found. ASSESSMENT AND PLAN: This is a very pleasant 69 years old white female recently diagnosed with:  1) Stage IV non-small cell lung cancer, squamous cell carcinoma completed systemic chemotherapy with carboplatin and Abraxane status post 6 cycles.  She is currently on treatment with immunotherapy according to the BMS checkmate 370 clinical trial with Nivolumab.she status post 6 cycles and tolerating her treatment fairly well but her treatment was on hold the last few weeks secondary to persistent diarrhea which significantly improved. We will continue to monitor this closely. The etiology of her diarrhea is unclear but it could be multifactorial including secondary to treatment with magnesium oxide, gabapentin, hyperthyroidism or Nivolumab, but now significantly improved. I recommended for the patient to resume her treatment with immunotherapy with Nivolumab today. For the hypomagnesemia, I will arrange for the patient to receive 2 g of magnesium sulfate IV today. For the hyperthyroidism, the patient is currently evaluated by her endocrinologist and undergoing laboratory testing for further evaluation of her condition. She would come back for follow-up visit in 2 weeks for reevaluation before starting cycle #8. For the peripheral neuropathy, the patient will continue her current treatment with Neurontin. The patient was advised to call immediately if she has any concerning symptoms in the interval.  The patient voices understanding of current disease status and treatment options and is in agreement with the  current care plan.  All questions were answered. The patient knows to call the clinic with any problems, questions or concerns. We can certainly see the patient much sooner if necessary.  Disclaimer: This note was dictated with voice recognition software. Similar sounding words can inadvertently be transcribed and may not be corrected upon review.

## 2014-08-13 NOTE — Telephone Encounter (Signed)
Per staff message and POF I have scheduled appts. Advised scheduler of appts. JMW  

## 2014-08-13 NOTE — Patient Instructions (Signed)
Hypomagnesemia Magnesium is a common ion (mineral) in the body which is needed for metabolism. It is about how the body handles food and other chemical reactions necessary for life. Only about 2% of the magnesium in our body is found in the blood. When this is low, it is called hypomagnesemia. The blood will measure only a tiny amount of the magnesium in our body. When it is low in our blood, it does not mean that the whole body supply is low. The normal serum concentration ranges from 1.8-2.5 mEq/L. When the level gets to be less than 1.0 mEq/L, a number of problems begin to happen.  CAUSES  1. Receiving intravenous fluids without magnesium replacement. 2. Loss of magnesium from the bowel by nasogastric suction. 3. Loss of magnesium from nausea and vomiting or severe diarrhea. Any of the inflammatory bowel conditions can cause this. 4. Abuse of alcohol often leads to low serum magnesium. 5. An inherited form of magnesium loss happens when the kidneys lose magnesium. This is called familial or primary hypomagnesemia. 6. Some medications such as diuretics also cause the loss of magnesium. SYMPTOMS  These following problems are worse if the changes in magnesium levels come on suddenly. 1. Tremor. 2. Confusion. 3. Muscle weakness. 4. Oversensitive to sights and sounds. 5. Sensitive reflexes. 6. Depression. 7. Muscular fibrillations. 8. Overreactivity of the nerves. 9. Irritability. 10. Psychosis. 11. Spasms of the hand muscles. 12. Tetany (where the muscles go into uncontrollable spasms). DIAGNOSIS  This condition can be diagnosed by blood tests. TREATMENT   In an emergency, magnesium can be given intravenously (by vein).  If the condition is less worrisome, it can be corrected by diet. High levels of magnesium are found in green leafy vegetables, peas, beans, and nuts among other things. It can also be given through medications by mouth.  If it is being caused by medications, changes can  be made.  If alcohol is a problem, help is available if there are difficulties giving it up. Document Released: 10/20/2004 Document Revised: 06/10/2013 Document Reviewed: 09/14/2007 [facility] Patient Information 2015 [place], [place]. This information is not intended to replace advice given to you by your health care provider. Make sure you discuss any questions you have with your health care provider.  Today you received IV fluids with magnesium. [facility] Discharge Instructions for Patients Receiving Chemotherapy  Today you received the following chemotherapy agents nivolumab.  To help prevent nausea and vomiting after your treatment, we encourage you to take your nausea medication.   If you develop nausea and vomiting that is not controlled by your nausea medication, call the clinic.   BELOW ARE SYMPTOMS THAT SHOULD BE REPORTED IMMEDIATELY:  *FEVER GREATER THAN 100.5 F  *CHILLS WITH OR WITHOUT FEVER  NAUSEA AND VOMITING THAT IS NOT CONTROLLED WITH YOUR NAUSEA MEDICATION  *UNUSUAL SHORTNESS OF BREATH  *UNUSUAL BRUISING OR BLEEDING  TENDERNESS IN MOUTH AND THROAT WITH OR WITHOUT PRESENCE OF ULCERS  *URINARY PROBLEMS  *BOWEL PROBLEMS  UNUSUAL RASH Items with * indicate a potential emergency and should be followed up as soon as possible.  Feel free to call the clinic you have any questions or concerns. The clinic phone number is [phone].  Please show the [place] at check-in to the Emergency Department and triage nurse.

## 2014-08-13 NOTE — Progress Notes (Signed)
08/13/2014 1330  BMS 370 Cycle 7 The patient is here for lab, MD visit, and infusion.  She had a resting O2 sat and vitals done.  She reports that she has been much better over the past week and that she feels good.  She reports that she had no diarrhea from 08/08/14 to 08/10/14, she had 1 episode of diarrhea on 08/11/14, no episodes on 08/12/14.  She said today she had a regular bowel movement and had 3 small episodes of diarrhea earlier today.  She denies having abdominal pain, abdominal cramping, or blood in her stool.  She denies any changes in her breathing.  She has been able to go about her usual activities of food shopping, etc.  She denies SOB except on exertion.  She denies changes in her urine, eyes, skin, or other areas.  She has a good appetite and requested to meet with the dietician for suggestions and recommendations. She would like to eat diary productus, but has avoided since the diarrhea started.  A referral was made to [facility] for weight loss. Dr. [name] was in the do a targeted physical exam and talk with patient.  Per Dr. [name], based on lab results review, physical exam, vitals signs, weight, and patient interview, the patient's condition is acceptable for continued treatment.  MD aware that the patient saw Dr. Buddy Duty (endocrinologist) yesterday and has asked for additional labs to be performed.  Dr. Worthy Flank nurse took the information from Dr. Buddy Duty for Dr. [name]. The patient said she had a follow-up visit with Dr. Buddy Duty to monitor her thyroid condition. Patient's TSH remains low and T4 is pending.  Per MD, patient is asymptomatic regarding low TSH and should not prevent treatment with Nivolumab today. Patient has had a grade 2 weight loss.  Per Dr. [name], due to the diarrhea, she has had the weight loss.  Per Dr. [name], the weight loss is not directly related to the study drug, but indirectly related.  The above was communicated to Dr. Mack Guise, medical monitor for PPD. Per Dr. Malka So, okay  to treat as long as Dr.Mohamed feels the weight loss is not directly related to the study drug.  Dr. Malka So said it is okay to treat if the weight loss is related to diarrhea. Confirmed with Dr. [name] again who said the weight loss is not directly related to the study drug.   The patient was educated to report any worsening of diarrhea and to call Dr. [name] and seek medical attention. She was reminded to take imodium and lomotil as previously instructed.  The patient verbalized comprehension.  IWRS  form with vial numbers given to Montel Clock, Pharm. D.  Signs for infusion given to Cira Rue, RN.  She was without questions. Marcellus Scott, RN Clinical Research

## 2014-08-13 NOTE — Telephone Encounter (Signed)
per pof to sch pt appt-gave pt copy of avs-pt stated needed scans-cld Marcellus Scott to adv no orders in system-adv to call me @ ext [phone]

## 2014-08-14 ENCOUNTER — Telehealth: Payer: Self-pay | Admitting: *Deleted

## 2014-08-14 NOTE — Telephone Encounter (Signed)
08/14/2014  1110  BMS 370: Group B, Arm A Follow up call to patient to inquire how she is doing. Spoke with patient's significant other, Agricultural engineer. [name] states that Tammie Clay was taking a nap.  He said she had been doing housework this morning.  He said that she had 1 bout of diarrhea this morning and that it had only happened one time since her treatment on 08/13/14. I reminded him to call clinic with any questions and concerns.  He thanked me for calling and said he would make sure that Ms. Wendorff called for increased diarrhea or other problems. Marcellus Scott, RN Clinical Research

## 2014-08-15 ENCOUNTER — Telehealth: Payer: Self-pay | Admitting: *Deleted

## 2014-08-15 NOTE — Telephone Encounter (Signed)
08/15/2014 1510  BMS 370: Group B, Arm A Follow up call to patient to inquire how she is doing. Spoke with the patient.  She said that she had been very busy and had a god day.  She denied diarrhea or other problems.  This RN reminded patient to call or seek medical attention if needed  for increased diarrhea or other problems.  She verbalized understanding and was without questions. Marcellus Scott, RN Clinical Research

## 2014-08-19 ENCOUNTER — Telehealth: Payer: Self-pay | Admitting: Medical Oncology

## 2014-08-19 NOTE — Telephone Encounter (Signed)
BMS 370: Group B, Arm A Patient called inquiring about a bill she received for treatment and the costs associated with treatment under study. Patient asking when her first treatment was with Nivloumab study drug and stats she was under the impression that all care was covered by study. Informed patient, again, that study drug is provided free of cost by BMS and her insurance should be covering standard of care.  I asked patient to bring medical bill with her next appointment so that I can make a copy and see if I can get more information for her regarding charges. Patient states she will bring bill in with next weeks appointment.   Inquired with patient as to how she was feeling and if she has had any more episodes of diarrhea. Patient states that she has had more regular BM's than diarrhea. Patient reports to have had one episode on Sunday (07/03) and one episode this morning and has taken Lomotil at the first onset of each episode as instructed. Patient reports no other concerns or new symptoms.  Patient also requesting to know whether the labs she requested to be sent to Dr. Buddy Duty, Endocrinologist, has been faxed to him. Informed patient I will forward message to Dr. Worthy Flank desk nurse.  I encouraged patient to call clinic with any questions/concerns or new symptoms, patient gave verbal understanding.  07/20 lab/NP/tx appts.  Mssg forwarded to Desk nurse for review of requested labs.  [name], RN, Clinical Research 08/19/2014 11:21 AM

## 2014-08-20 ENCOUNTER — Other Ambulatory Visit: Payer: Self-pay | Admitting: Medical Oncology

## 2014-08-20 ENCOUNTER — Telehealth: Payer: Self-pay | Admitting: Medical Oncology

## 2014-08-20 DIAGNOSIS — C3492 Malignant neoplasm of unspecified part of left bronchus or lung: Secondary | ICD-10-CM

## 2014-08-20 NOTE — Telephone Encounter (Signed)
BMS 370: Group B, Arm A. Patient LVMOM this morning @ 0831 with me asking for return call. Return call to patient @ 0840. Patient wanted to inform me that yesterday she had a total of 3 episodes of diarrhea, @ 1030 pm 1 soft stool and another soft stool at 330 am. Patient confirms that she has been taking and alternating between lomotil and imodium. Patient also confirms that she is drinking 8-10 glasses of water, in addition she reports to drinking Gatorade as well. I inquired with her if she has started drinking clear ensure and minimizing dairy intake, patient confirms she is and that she started ensure last week Thursday. Patient denies any new symptoms, denies n/v, denies pain/cramping. I reviewed patient's diarrhea episodes with Dr. [name] for further instructions and per MD, patient to continue with pushing fluids and take antidiarrheals as instructed and patient to call clinic should diarrhea episodes increase to 5 or more in one day. Patient gave verbal understanding and she knows to call triage should diarrhea episodes increase. Patient denies further questions at this time. [name], RN, Clinical Research 08/20/2014 9:52 AM

## 2014-08-20 NOTE — Telephone Encounter (Signed)
done

## 2014-08-22 ENCOUNTER — Telehealth: Payer: Self-pay | Admitting: Internal Medicine

## 2014-08-22 ENCOUNTER — Encounter: Payer: Self-pay | Admitting: Medical Oncology

## 2014-08-22 ENCOUNTER — Other Ambulatory Visit: Payer: Self-pay | Admitting: *Deleted

## 2014-08-22 ENCOUNTER — Telehealth: Payer: Self-pay | Admitting: *Deleted

## 2014-08-22 DIAGNOSIS — C3492 Malignant neoplasm of unspecified part of left bronchus or lung: Secondary | ICD-10-CM

## 2014-08-22 NOTE — Telephone Encounter (Signed)
s.w. pt and advised on 7.21 appt....pt ok and aware

## 2014-08-22 NOTE — Telephone Encounter (Signed)
OK if I have availability or with MD before 2:00 PM as I am leaving the office a 3:00 PM that day.

## 2014-08-22 NOTE — Telephone Encounter (Signed)
Sent staff msg to Dr. Tyrell Antonio and Cherre Huger for time to see pt on 7.21

## 2014-08-22 NOTE — Progress Notes (Signed)
BMS 370: Group B, Arm A: Patient called to inform me that she had visit with Dr. Buddy Duty, endocrinologist, this week and patient states that Dr. Buddy Duty gave her "a possible diagnosis of Graves Disease." Patient reports that he is prescribing her a medication for treatment which she will p.u from her pharmacy today. Patient states she cannot recall the name of the medication. I informed patient that I will request Dr. Cindra Eves office to fax [place] any office visit notes, from her visit with him, for more information and for her file, patient agreed to this and expressed thanks. Patient has current ROI on file. Patient stressed with me that she wished to know the results of her CT scan scheduled 07/21,  ASAP, states she is "worrying enough already" and does not wish to wait. I informed patient that Ellery Plunk, RN is working on a possible appt with Dr. [name] that afternoon, pending availability. Patient expressed thanks, denies further questions at this time. I encouraged patient to call with any questions/concerns.  Patient scheduled for Cycle 8 July 20th, CT- July 21st. [name], RN, Clinical Research 08/22/2014 1:49 PM

## 2014-08-22 NOTE — Telephone Encounter (Signed)
Pt was concerned as she had a call from Dr Cindra Eves office regarding a new med he wants her to take for thyroid. Discussed with Diane and informed patient this is OK as our office faxed Dr Buddy Duty her tyroid labs.  Pt would like to see Dr [name] on Thursday 08/28/14 after her CT.

## 2014-08-26 ENCOUNTER — Other Ambulatory Visit: Payer: Self-pay | Admitting: Medical Oncology

## 2014-08-26 DIAGNOSIS — C3492 Malignant neoplasm of unspecified part of left bronchus or lung: Secondary | ICD-10-CM

## 2014-08-27 ENCOUNTER — Encounter: Payer: Self-pay | Admitting: Internal Medicine

## 2014-08-27 ENCOUNTER — Other Ambulatory Visit (HOSPITAL_BASED_OUTPATIENT_CLINIC_OR_DEPARTMENT_OTHER): Payer: Medicare Other

## 2014-08-27 ENCOUNTER — Ambulatory Visit (HOSPITAL_BASED_OUTPATIENT_CLINIC_OR_DEPARTMENT_OTHER): Payer: Medicare Other | Admitting: Internal Medicine

## 2014-08-27 ENCOUNTER — Ambulatory Visit: Payer: Medicare Other

## 2014-08-27 ENCOUNTER — Telehealth: Payer: Self-pay | Admitting: Internal Medicine

## 2014-08-27 ENCOUNTER — Other Ambulatory Visit: Payer: Self-pay | Admitting: Medical Oncology

## 2014-08-27 ENCOUNTER — Encounter: Payer: Self-pay | Admitting: Medical Oncology

## 2014-08-27 ENCOUNTER — Other Ambulatory Visit (HOSPITAL_COMMUNITY)
Admission: RE | Admit: 2014-08-27 | Discharge: 2014-08-27 | Disposition: A | Discharge status: Home / Self Care | Payer: Medicare Other | Source: Ambulatory Visit | Attending: Internal Medicine | Admitting: Internal Medicine

## 2014-08-27 VITALS — BP 115/80 | HR 78 | Temp 98.0°F | Resp 19 | Ht 62.0 in | Wt 156.0 lb

## 2014-08-27 DIAGNOSIS — Z006 Encounter for examination for normal comparison and control in clinical research program: Secondary | ICD-10-CM

## 2014-08-27 DIAGNOSIS — C3492 Malignant neoplasm of unspecified part of left bronchus or lung: Secondary | ICD-10-CM

## 2014-08-27 DIAGNOSIS — C3412 Malignant neoplasm of upper lobe, left bronchus or lung: Secondary | ICD-10-CM

## 2014-08-27 DIAGNOSIS — Z5112 Encounter for antineoplastic immunotherapy: Secondary | ICD-10-CM

## 2014-08-27 DIAGNOSIS — E0590 Thyrotoxicosis, unspecified without thyrotoxic crisis or storm: Secondary | ICD-10-CM

## 2014-08-27 DIAGNOSIS — R197 Diarrhea, unspecified: Secondary | ICD-10-CM

## 2014-08-27 DIAGNOSIS — Z87891 Personal history of nicotine dependence: Secondary | ICD-10-CM | POA: Diagnosis not present

## 2014-08-27 DIAGNOSIS — G620 Drug-induced polyneuropathy: Secondary | ICD-10-CM | POA: Diagnosis not present

## 2014-08-27 DIAGNOSIS — D6481 Anemia due to antineoplastic chemotherapy: Secondary | ICD-10-CM

## 2014-08-27 DIAGNOSIS — T451X5A Adverse effect of antineoplastic and immunosuppressive drugs, initial encounter: Secondary | ICD-10-CM

## 2014-08-27 LAB — CBC WITH DIFFERENTIAL/PLATELET
BASO%: 0.4 % (ref 0.0–2.0)
BASOS ABS: 0 10*3/uL (ref 0.0–0.1)
EOS%: 1.8 % (ref 0.0–7.0)
Eosinophils Absolute: 0.1 10*3/uL (ref 0.0–0.5)
HEMATOCRIT: 35.7 % (ref 34.8–46.6)
HGB: 11.7 g/dL (ref 11.6–15.9)
LYMPH%: 18.7 % (ref 14.0–49.7)
MCH: 29 pg (ref 25.1–34.0)
MCHC: 32.8 g/dL (ref 31.5–36.0)
MCV: 88.6 fL (ref 79.5–101.0)
MONO#: 0.5 10*3/uL (ref 0.1–0.9)
MONO%: 6.6 % (ref 0.0–14.0)
NEUT#: 5.4 10*3/uL (ref 1.5–6.5)
NEUT%: 72.5 % (ref 38.4–76.8)
PLATELETS: 256 10*3/uL (ref 145–400)
RBC: 4.03 10*6/uL (ref 3.70–5.45)
RDW: 14.2 % (ref 11.2–14.5)
WBC: 7.4 10*3/uL (ref 3.9–10.3)
lymph#: 1.4 10*3/uL (ref 0.9–3.3)

## 2014-08-27 LAB — LACTATE DEHYDROGENASE (CC13): LDH: 162 U/L (ref 125–245)

## 2014-08-27 LAB — COMPREHENSIVE METABOLIC PANEL (CC13)
ALT: 13 U/L (ref 0–55)
AST: 13 U/L (ref 5–34)
Albumin: 3.7 g/dL (ref 3.5–5.0)
Alkaline Phosphatase: 72 U/L (ref 40–150)
Anion Gap: 9 mEq/L (ref 3–11)
BILIRUBIN TOTAL: 0.38 mg/dL (ref 0.20–1.20)
BUN: 20.1 mg/dL (ref 7.0–26.0)
CHLORIDE: 100 meq/L (ref 98–109)
CO2: 30 mEq/L — ABNORMAL HIGH (ref 22–29)
Calcium: 9.9 mg/dL (ref 8.4–10.4)
Creatinine: 1.1 mg/dL (ref 0.6–1.1)
EGFR: 53 mL/min/{1.73_m2} — ABNORMAL LOW (ref >90)
GLUCOSE: 98 mg/dL (ref 70–140)
Potassium: 4 mEq/L (ref 3.5–5.1)
SODIUM: 139 meq/L (ref 136–145)
Total Protein: 7.2 g/dL (ref 6.4–8.3)

## 2014-08-27 LAB — PHOSPHORUS: Phosphorus: 2.8 mg/dL (ref 2.5–4.6)

## 2014-08-27 LAB — MAGNESIUM (CC13): Magnesium: 1.4 mg/dl — CL (ref 1.5–2.5)

## 2014-08-27 LAB — TSH CHCC: TSH: <0.08 m(IU)/L — ABNORMAL LOW (ref 0.308–3.960)

## 2014-08-27 LAB — T4, FREE: Free T4: 1.31 ng/dL (ref 0.80–1.80)

## 2014-08-27 NOTE — Progress Notes (Signed)
BMS 370: Group B, Arm A. Cycle 8 Met with patient and significant other today after patient's lab appointment. Resting vitals obtained and patient O2 sat @ 98%, weight @ 156.00 lbs (-1 lb from 2 weeks ago). Patient reports that her appetite has "waned" some and reports her energy as "ok" and is able to go about her usual activities. Patient states she is still drinking clear ensure on most occassions, but confirms she is not drinking it daily. Patient reports having diarrhea 4 days, since July 7th, with no more than 3 episodes per day and having mostly "regular bowel movements." Patient states she continues with lomotil and imodium as directed for the diarrhea, overall diarrhea has significantly improved. Patient does report new pain to upper left side of back, started on the 17th and rates it as a 3 on a scale of 0-10, states she took one Norco tablet last night for the pain. Patient denies; bleeding, cough, wheezing, swelling, nausea, vomiting, headaches, skin issues, or any new symptoms other than what is reported in this note. Patient confirms that neuropathy continues with no improvement as well as confirms that she no longer is taking the Gabepentin since it has had no effect in improvement to the neuropathy. Concomitant medications reviewed and patient reports new prescription, from Dr. Buddy Duty endocrinologist following her for hyperthyroidism, of methimazole 20 mg daily. She states she is scheduled for labs at his office on the 07/22. Magnesium resulted today @ 1.4. Patient with scheduled CT tomorrow morning. Per MD patient scheduled for Cycle 8, pending CT results, Friday 07/22 with IV mag. All patient's questions answered to her satisfaction, patient knows to call for questions concerns.  [name], RN, BSN, Clinical Research 08/27/2014 2:42 PM

## 2014-08-27 NOTE — Progress Notes (Signed)
[place] [phone]   Fax:([phone]) [place] [address] [employer] [address] [place]  [address]  DIAGNOSIS: Stage IV (T2, N2, M1a) non-small cell lung cancer, squamous cell carcinoma presented with central left upper lobe lung mass with associated postobstructive atelectasis as well as ipsilateral and subcarinal lymphadenopathy as well as pleural tumor spread diagnosed in October 2015.  PRIOR THERAPY: Systemic chemotherapy with carboplatin for AUC of 5 on day 1 and Abraxane 100 MG/M2 on days 1, 8 and 15 every 3 weeks. Status post 6 cycles.   CURRENT THERAPY: Nivolumab per research protocol BMS 370 group B, arm A, given every 2 weeks. Status post 7 cycles  INTERVAL HISTORY: Tammie Clay 69 y.o. female returns to the clinic today for follow-up visit accompanied by her significant other. She is currently undergoing treatment with immunotherapy with Nivolumab 240 mg fixed dose every 2 weeks status post 7 cycles. She tolerated the last cycle of her treatment much better with no significant diarrhea except for one or 2 days when she had 3 loose stools. She was seen recently by her endocrinologist and started on treatment with methimazole for hyperthyroidism. She denied having any significant fever or chills. The patient denied having any significant chest pain, shortness of breath, cough or hemoptysis. She has no nausea or vomiting. She was supposed to start cycle #8 today but the patient also scheduled to have CT scan of the chest, abdomen and pelvis tomorrow for restaging of her disease.  MEDICAL HISTORY: Past Medical History  Diagnosis Date  . Hypertension   . COPD (chronic obstructive pulmonary disease)   . Hyperlipidemia   . GERD (gastroesophageal reflux disease)   . Osteopenia   . Insomnia   . Mediastinal lymphadenopathy 11/19/13    PER CT  . Multiple lung nodules on CT 11/19/13    LEFT LUNG BASE  . Lung  mass 11/19/13    RUL PER CT  . Coronary artery calcification seen on CAT scan 11/21/2013  . COPD (chronic obstructive pulmonary disease) 11/21/2013  . Pneumonia     hx of  . Anxiety     ALLERGIES:  has No Known Allergies.  MEDICATIONS:  Current Outpatient Prescriptions  Medication Sig Dispense Refill  . acetaminophen (TYLENOL) 325 MG tablet Take 650 mg by mouth daily as needed for moderate pain or headache. Take 2 tablets every morning    . albuterol (PROVENTIL HFA;VENTOLIN HFA) 108 (90 BASE) MCG/ACT inhaler Inhale 2 puffs into the lungs every 6 (six) hours as needed for wheezing.    . calcium carbonate (TUMS - DOSED IN MG ELEMENTAL CALCIUM) 500 MG chewable tablet Chew 1 tablet by mouth daily as needed for indigestion or heartburn.     . diazepam (VALIUM) 5 MG tablet Take 5 mg by mouth at bedtime.    . diphenoxylate-atropine (LOMOTIL) 2.5-0.025 MG per tablet Take 2 tablets by mouth 4 (four) times daily as needed for diarrhea or loose stools. 45 tablet 0  . DULoxetine (CYMBALTA) 30 MG capsule TAKE 1 CAPSULE (30 MG TOTAL) BY MOUTH DAILY. 30 capsule 2  . HYDROcodone-acetaminophen (NORCO) 10-325 MG per tablet Take 1 tablet by mouth every 6 (six) hours as needed for moderate pain or severe pain.     Marland Kitchen lidocaine-prilocaine (EMLA) cream Apply 1 application topically as needed. 30 g 0  . lisinopril-hydrochlorothiazide (PRINZIDE,ZESTORETIC) 20-12.5 MG per tablet Take 2 tablets by mouth every morning.     Marland Kitchen  loperamide (IMODIUM) 2 MG capsule Take by mouth as needed for diarrhea or loose stools.    Marland Kitchen loratadine (CLARITIN) 10 MG tablet Take 10 mg by mouth daily.    . magnesium oxide (MAG-OX) 400 (241.3 MG) MG tablet Take 1 tablet (400 mg total) by mouth 2 (two) times daily. 60 tablet 0  . methimazole (TAPAZOLE) 10 MG tablet Take 10 mg by mouth 2 (two) times daily.    . metoprolol succinate (TOPROL-XL) 50 MG 24 hr tablet Take 50 mg by mouth every morning. Take with or immediately following a meal.    .  prochlorperazine (COMPAZINE) 10 MG tablet Take 1 tablet (10 mg total) by mouth every 6 (six) hours as needed for nausea or vomiting. 30 tablet 1  . ranitidine (ZANTAC) 150 MG tablet Take 150 mg by mouth 2 (two) times daily as needed for heartburn.     . SYMBICORT 80-4.5 MCG/ACT inhaler Inhale 2 puffs into the lungs 2 (two) times daily.   3  . docusate sodium (COLACE) 100 MG capsule Take 100 mg by mouth 2 (two) times daily as needed for moderate constipation.     . Multiple Vitamin (MULTIVITAMIN WITH MINERALS) TABS Take 1 tablet by mouth daily.    . polyethylene glycol (MIRALAX / GLYCOLAX) packet Take 17 g by mouth daily as needed for moderate constipation or severe constipation.      No current facility-administered medications for this visit.    SURGICAL HISTORY:  Past Surgical History  Procedure Laterality Date  .  c section x 2    . Urethral cyst removed    . Tonsillectomy    . Appendectomy    . Colonoscopy with propofol N/A 07/17/2012    Procedure: COLONOSCOPY WITH PROPOFOL;  Surgeon: Garlan Fair, MD;  Location: [facility];  Service: Endoscopy;  Laterality: N/A;  . Esophagogastroduodenoscopy (egd) with propofol N/A 07/17/2012    Procedure: ESOPHAGOGASTRODUODENOSCOPY (EGD) WITH PROPOFOL;  Surgeon: Garlan Fair, MD;  Location: [facility];  Service: Endoscopy;  Laterality: N/A;  . Video bronchoscopy with endobronchial ultrasound N/A 12/03/2013    Procedure: VIDEO BRONCHOSCOPY WITH ENDOBRONCHIAL ULTRASOUND WITH BIOPSIES;  Surgeon: Grace Isaac, MD;  Location: [place];  Service: Thoracic;  Laterality: N/A;  . Portacath placement Left 12/10/2013    Procedure: INSERTION PORT-A-CATH;  Surgeon: Grace Isaac, MD;  Location: [place];  Service: Thoracic;  Laterality: Left;    REVIEW OF SYSTEMS:  Constitutional: negative Eyes: negative Ears, nose, mouth, throat, and face: negative Respiratory: negative Cardiovascular: negative Gastrointestinal: positive for  diarrhea Genitourinary:negative Integument/breast: negative Hematologic/lymphatic: negative Musculoskeletal:negative Neurological: positive for paresthesia Behavioral/Psych: negative Endocrine: negative Allergic/Immunologic: negative   PHYSICAL EXAMINATION: General appearance: alert, cooperative, fatigued and no distress Head: Normocephalic, without obvious abnormality, atraumatic Neck: no adenopathy, no JVD, supple, symmetrical, trachea midline and thyroid not enlarged, symmetric, no tenderness/mass/nodules Lymph nodes: Cervical, supraclavicular, and axillary nodes normal. Resp: clear to auscultation bilaterally Back: symmetric, no curvature. ROM normal. No CVA tenderness. Cardio: regular rate and rhythm, S1, S2 normal, no murmur, click, rub or gallop GI: soft, non-tender; bowel sounds normal; no masses,  no organomegaly Genitalia: defer exam Extremities: extremities normal, atraumatic, no cyanosis or edema Neurologic: Alert and oriented X 3, normal strength and tone. Normal symmetric reflexes. Normal coordination and gait  ECOG PERFORMANCE STATUS: 1 - Symptomatic but completely ambulatory  Blood pressure 115/80, pulse 78, temperature 98 F (36.7 C), temperature source Oral, resp. rate 19, height '5\' 2"'$  (1.575 m), weight 156 lb (70.761  kg), SpO2 98 %.  LABORATORY DATA: Lab Results  Component Value Date   WBC 7.4 08/27/2014   HGB 11.7 08/27/2014   HCT 35.7 08/27/2014   MCV 88.6 08/27/2014   PLT 256 08/27/2014      Chemistry      Component Value Date/Time   NA 139 08/27/2014 1039   NA 135 07/19/2014 1607   K 4.0 08/27/2014 1039   K 3.4* 07/19/2014 1607   CL 99* 07/19/2014 1607   CO2 30* 08/27/2014 1039   CO2 28 07/19/2014 1607   BUN 20.1 08/27/2014 1039   BUN 21* 07/19/2014 1607   CREATININE 1.1 08/27/2014 1039   CREATININE 1.01* 07/19/2014 1607   CREATININE 0.80 11/22/2013 1054      Component Value Date/Time   CALCIUM 9.9 08/27/2014 1039   CALCIUM 8.7* 07/19/2014  1607   ALKPHOS 72 08/27/2014 1039   ALKPHOS 77 12/10/2013 0637   AST 13 08/27/2014 1039   AST 16 12/10/2013 0637   ALT 13 08/27/2014 1039   ALT 16 12/10/2013 0637   BILITOT 0.38 08/27/2014 1039   BILITOT 0.4 12/10/2013 0637       RADIOGRAPHIC STUDIES: No results found. ASSESSMENT AND PLAN: This is a very pleasant 69 years old white female recently diagnosed with:  1) Stage IV non-small cell lung cancer, squamous cell carcinoma completed systemic chemotherapy with carboplatin and Abraxane status post 6 cycles.  She is currently on treatment with immunotherapy according to the BMS checkmate 370 clinical trial with Nivolumab.she status post 7 cycles and tolerating her last treatment fairly well with no significant diarrhea. She was supposed to start cycle #8 today but unfortunately her CT scan of the chest, abdomen and pelvis is is scheduled for tomorrow. I recommended for the patient to delay the start of cycle #8 until the results of the CT scans are available. If she has no evidence for disease progression on the scan tomorrow, she will proceed with cycle #8 as a scheduled tomorrow afternoon and she will come back for follow-up visit in 2 weeks for the next cycle of her treatment. I also discussed with the patient the other treatment options in case she has any evidence for disease progression. I would consider her for treatment with docetaxel and Cyramza if the scan tomorrow showed disease progression. She would have a follow-up appointment was in one week for more detailed discussion of this option.  2) Hypomagnesemia, I will arrange for the patient to receive 2 g of magnesium sulfate IV tomorrow with her infusion.  3) Hyperthyroidism, the patient will continue her treatment with methimazole and follow-up visit by her endocrinologist.  4) For the peripheral neuropathy, the patient will continue her current treatment with Neurontin. The patient was advised to call immediately if she has  any concerning symptoms in the interval.  The patient voices understanding of current disease status and treatment options and is in agreement with the current care plan.  All questions were answered. The patient knows to call the clinic with any problems, questions or concerns. We can certainly see the patient much sooner if necessary.  Disclaimer: This note was dictated with voice recognition software. Similar sounding words can inadvertently be transcribed and may not be corrected upon review.

## 2014-08-27 NOTE — Telephone Encounter (Signed)
Gave and printed appt sched and avs for pt for July  °

## 2014-08-28 ENCOUNTER — Other Ambulatory Visit: Payer: Self-pay | Admitting: *Deleted

## 2014-08-28 ENCOUNTER — Ambulatory Visit: Payer: Medicare Other | Admitting: Internal Medicine

## 2014-08-28 ENCOUNTER — Encounter (HOSPITAL_COMMUNITY): Payer: Self-pay

## 2014-08-28 ENCOUNTER — Ambulatory Visit (HOSPITAL_COMMUNITY)
Admission: RE | Admit: 2014-08-28 | Discharge: 2014-08-28 | Disposition: A | Discharge status: Home / Self Care | Payer: Medicare Other | Source: Ambulatory Visit | Attending: Internal Medicine | Admitting: Internal Medicine

## 2014-08-28 ENCOUNTER — Telehealth: Payer: Self-pay | Admitting: Internal Medicine

## 2014-08-28 ENCOUNTER — Telehealth: Payer: Self-pay | Admitting: *Deleted

## 2014-08-28 DIAGNOSIS — R0602 Shortness of breath: Secondary | ICD-10-CM | POA: Insufficient documentation

## 2014-08-28 DIAGNOSIS — C3492 Malignant neoplasm of unspecified part of left bronchus or lung: Secondary | ICD-10-CM

## 2014-08-28 DIAGNOSIS — I714 Abdominal aortic aneurysm, without rupture: Secondary | ICD-10-CM | POA: Insufficient documentation

## 2014-08-28 DIAGNOSIS — C3490 Malignant neoplasm of unspecified part of unspecified bronchus or lung: Secondary | ICD-10-CM | POA: Diagnosis present

## 2014-08-28 DIAGNOSIS — R590 Localized enlarged lymph nodes: Secondary | ICD-10-CM | POA: Insufficient documentation

## 2014-08-28 DIAGNOSIS — C7800 Secondary malignant neoplasm of unspecified lung: Secondary | ICD-10-CM | POA: Diagnosis not present

## 2014-08-28 HISTORY — DX: Malignant (primary) neoplasm, unspecified: C80.1

## 2014-08-28 MED ORDER — IOHEXOL 300 MG/ML  SOLN
100.0000 mL | Freq: Once | INTRAMUSCULAR | Status: AC | PRN
  Administered 2014-08-28: 100 mL via INTRAVENOUS

## 2014-08-28 NOTE — Telephone Encounter (Signed)
PT. CALLED FOR RESULTS OF HER CT SCAN DONE TODAY.

## 2014-08-28 NOTE — Telephone Encounter (Addendum)
I called pt and told her she is only getting mag tomorrow .  "Does that mean I have progression?" . I told her [name] will call her and gave her appt for Monday am with mohamed. Note sent to [facility] .

## 2014-08-28 NOTE — Telephone Encounter (Signed)
S/w pt confirming labs/ov per 07/21 POF pt will p/u schedule tomorrow next visit.... KJ

## 2014-08-29 ENCOUNTER — Other Ambulatory Visit: Payer: Self-pay | Admitting: Physician Assistant

## 2014-08-29 ENCOUNTER — Ambulatory Visit (HOSPITAL_BASED_OUTPATIENT_CLINIC_OR_DEPARTMENT_OTHER): Payer: Medicare Other

## 2014-08-29 VITALS — BP 104/60 | HR 72 | Temp 98.7°F | Resp 24

## 2014-08-29 DIAGNOSIS — C3492 Malignant neoplasm of unspecified part of left bronchus or lung: Secondary | ICD-10-CM

## 2014-08-29 DIAGNOSIS — R790 Abnormal level of blood mineral: Secondary | ICD-10-CM

## 2014-08-29 DIAGNOSIS — C3490 Malignant neoplasm of unspecified part of unspecified bronchus or lung: Secondary | ICD-10-CM

## 2014-08-29 MED ORDER — SODIUM CHLORIDE 0.9 % IJ SOLN
10.0000 mL | INTRAMUSCULAR | Status: DC | PRN
  Administered 2014-08-29: 10 mL via INTRAVENOUS
  Filled 2014-08-29: qty 10

## 2014-08-29 MED ORDER — HEPARIN SOD (PORK) LOCK FLUSH 100 UNIT/ML IV SOLN
500.0000 [IU] | Freq: Once | INTRAVENOUS | Status: AC
  Administered 2014-08-29: 500 [IU] via INTRAVENOUS
  Filled 2014-08-29: qty 5

## 2014-08-29 MED ORDER — MAGNESIUM SULFATE 50 % IJ SOLN
2.0000 g | Freq: Once | INTRAMUSCULAR | Status: AC
  Administered 2014-08-29: 2 g via INTRAVENOUS
  Filled 2014-08-29: qty 4

## 2014-08-29 NOTE — Patient Instructions (Signed)
Hypomagnesemia Magnesium is a common ion (mineral) in the body which is needed for metabolism. It is about how the body handles food and other chemical reactions necessary for life. Only about 2% of the magnesium in our body is found in the blood. When this is low, it is called hypomagnesemia. The blood will measure only a tiny amount of the magnesium in our body. When it is low in our blood, it does not mean that the whole body supply is low. The normal serum concentration ranges from 1.8-2.5 mEq/L. When the level gets to be less than 1.0 mEq/L, a number of problems begin to happen.  CAUSES   Receiving intravenous fluids without magnesium replacement.  Loss of magnesium from the bowel by nasogastric suction.  Loss of magnesium from nausea and vomiting or severe diarrhea. Any of the inflammatory bowel conditions can cause this.  Abuse of alcohol often leads to low serum magnesium.  An inherited form of magnesium loss happens when the kidneys lose magnesium. This is called familial or primary hypomagnesemia.  Some medications such as diuretics also cause the loss of magnesium. SYMPTOMS  These following problems are worse if the changes in magnesium levels come on suddenly.  Tremor.  Confusion.  Muscle weakness.  Oversensitive to sights and sounds.  Sensitive reflexes.  Depression.  Muscular fibrillations.  Overreactivity of the nerves.  Irritability.  Psychosis.  Spasms of the hand muscles.  Tetany (where the muscles go into uncontrollable spasms). DIAGNOSIS  This condition can be diagnosed by blood tests. TREATMENT   In an emergency, magnesium can be given intravenously (by vein).  If the condition is less worrisome, it can be corrected by diet. High levels of magnesium are found in green leafy vegetables, peas, beans, and nuts among other things. It can also be given through medications by mouth.  If it is being caused by medications, changes can be made.  If  alcohol is a problem, help is available if there are difficulties giving it up. Document Released: 10/20/2004 Document Revised: 06/10/2013 Document Reviewed: 09/14/2007 [facility] Patient Information 2015 [place], [place]. This information is not intended to replace advice given to you by your health care provider. Make sure you discuss any questions you have with your health care provider.

## 2014-08-31 NOTE — Telephone Encounter (Signed)
i spoke to her.

## 2014-09-01 ENCOUNTER — Ambulatory Visit: Payer: Medicare Other | Admitting: Internal Medicine

## 2014-09-01 ENCOUNTER — Other Ambulatory Visit: Payer: Self-pay | Admitting: Medical Oncology

## 2014-09-01 ENCOUNTER — Telehealth: Payer: Self-pay | Admitting: Medical Oncology

## 2014-09-01 ENCOUNTER — Ambulatory Visit (HOSPITAL_BASED_OUTPATIENT_CLINIC_OR_DEPARTMENT_OTHER): Payer: Medicare Other | Admitting: Internal Medicine

## 2014-09-01 ENCOUNTER — Telehealth: Payer: Self-pay | Admitting: Internal Medicine

## 2014-09-01 ENCOUNTER — Other Ambulatory Visit (HOSPITAL_BASED_OUTPATIENT_CLINIC_OR_DEPARTMENT_OTHER): Payer: Medicare Other

## 2014-09-01 ENCOUNTER — Other Ambulatory Visit: Payer: Self-pay | Admitting: *Deleted

## 2014-09-01 ENCOUNTER — Encounter: Payer: Self-pay | Admitting: Medical Oncology

## 2014-09-01 ENCOUNTER — Telehealth: Payer: Self-pay | Admitting: *Deleted

## 2014-09-01 ENCOUNTER — Other Ambulatory Visit: Payer: Self-pay | Admitting: Internal Medicine

## 2014-09-01 ENCOUNTER — Encounter: Payer: Self-pay | Admitting: Internal Medicine

## 2014-09-01 VITALS — BP 142/82 | HR 81 | Temp 98.3°F | Resp 18 | Ht 62.0 in | Wt 156.1 lb

## 2014-09-01 DIAGNOSIS — C3412 Malignant neoplasm of upper lobe, left bronchus or lung: Secondary | ICD-10-CM

## 2014-09-01 DIAGNOSIS — C3492 Malignant neoplasm of unspecified part of left bronchus or lung: Secondary | ICD-10-CM

## 2014-09-01 DIAGNOSIS — Z006 Encounter for examination for normal comparison and control in clinical research program: Secondary | ICD-10-CM

## 2014-09-01 DIAGNOSIS — Z79899 Other long term (current) drug therapy: Secondary | ICD-10-CM | POA: Diagnosis not present

## 2014-09-01 LAB — CBC WITH DIFFERENTIAL/PLATELET
BASO%: 0.6 % (ref 0.0–2.0)
Basophils Absolute: 0 10*3/uL (ref 0.0–0.1)
EOS ABS: 0.1 10*3/uL (ref 0.0–0.5)
EOS%: 1.3 % (ref 0.0–7.0)
HCT: 35.4 % (ref 34.8–46.6)
HGB: 11.6 g/dL (ref 11.6–15.9)
LYMPH%: 18.4 % (ref 14.0–49.7)
MCH: 28.5 pg (ref 25.1–34.0)
MCHC: 32.8 g/dL (ref 31.5–36.0)
MCV: 87.1 fL (ref 79.5–101.0)
MONO#: 0.5 10*3/uL (ref 0.1–0.9)
MONO%: 7.2 % (ref 0.0–14.0)
NEUT%: 72.5 % (ref 38.4–76.8)
NEUTROS ABS: 5.2 10*3/uL (ref 1.5–6.5)
Platelets: 302 10*3/uL (ref 145–400)
RBC: 4.07 10*6/uL (ref 3.70–5.45)
RDW: 14.8 % — AB (ref 11.2–14.5)
WBC: 7.1 10*3/uL (ref 3.9–10.3)
lymph#: 1.3 10*3/uL (ref 0.9–3.3)

## 2014-09-01 LAB — COMPREHENSIVE METABOLIC PANEL (CC13)
ALBUMIN: 3.7 g/dL (ref 3.5–5.0)
ALT: 12 U/L (ref 0–55)
AST: 13 U/L (ref 5–34)
Alkaline Phosphatase: 71 U/L (ref 40–150)
Anion Gap: 9 mEq/L (ref 3–11)
BUN: 13.7 mg/dL (ref 7.0–26.0)
CO2: 30 mEq/L — ABNORMAL HIGH (ref 22–29)
CREATININE: 0.9 mg/dL (ref 0.6–1.1)
Calcium: 9.9 mg/dL (ref 8.4–10.4)
Chloride: 102 mEq/L (ref 98–109)
EGFR: 67 mL/min/{1.73_m2} — ABNORMAL LOW (ref >90)
Glucose: 101 mg/dl (ref 70–140)
POTASSIUM: 3.7 meq/L (ref 3.5–5.1)
Sodium: 141 mEq/L (ref 136–145)
Total Bilirubin: 0.25 mg/dL (ref 0.20–1.20)
Total Protein: 7.2 g/dL (ref 6.4–8.3)

## 2014-09-01 LAB — PHOSPHORUS: PHOSPHORUS: 2.9 mg/dL (ref 2.1–4.3)

## 2014-09-01 LAB — MAGNESIUM (CC13): Magnesium: 1.4 mg/dl — CL (ref 1.5–2.5)

## 2014-09-01 LAB — TSH CHCC

## 2014-09-01 LAB — RESEARCH LABS

## 2014-09-01 LAB — LACTATE DEHYDROGENASE (CC13): LDH: 160 U/L (ref 125–245)

## 2014-09-01 MED ORDER — MAGNESIUM OXIDE 400 (241.3 MG) MG PO TABS: 400.0000 mg | ORAL_TABLET | Freq: Two times a day (BID) | ORAL | Status: DC

## 2014-09-01 MED ORDER — DEXAMETHASONE 4 MG PO TABS: ORAL_TABLET | ORAL | Status: DC

## 2014-09-01 NOTE — Progress Notes (Signed)
BMS 370: Group B, Arm A. End of Treatment. Patient had CT (8 week #2) scan on 7/21. Per MD, patient with progression and Dr. [name] discontinued study drug, Nivolumab, secondary to disease progression, patient informed by Dr. [name] on the 21st.  Patient here for office visit with Dr. [name] for End of Treatment on study with Nivolumab and to discuss with Dr. [name] further treatment options. PRO's completed by Tammie Clay, research assistant, prior to patient's lab appointment. I met with patient, significant other and patient's daughter in exam room with MD and nurse navigator. EOT research labs, resting vitals and O2 saturation at 98%, A/E's and collection of concomitant medications done. Patient reports no GI problems and specifically states that diarrhea has resolved. Patient denies pulmonary concerns other than continued shortness of breath with exertion. Patient specifically denies: swelling, skin problems, eye problems, headaches, nausea and vomiting. Patient continues to report "discomfort" in upper back and front chest area daily and states she notices it more so when lay down, states she takes tylenol PRN for it and denies pain at today's visit. Patient states her energy level is within her normal, her appetite "waivers," and she "isn't hungry, but eats" and she has had no significant weight changes since 7/6 office visit.  Patient also reports being "anxious" due to results of recent CT scan. I informed patient of treatment study follow up visits and what she can expect. Patient expressed understanding. I inquired with patient and family if they had any questions and they denied questions at this time. I thanked patient and family for their time and continued support of study and encouraged them to call Dr. [name] or myself with any questions or concerns. Patient to start treatment of docetaxel and cyramza next week.  Tammie Dan, RN, BSN Clinical Research 09/01/2014 4:57 PM

## 2014-09-01 NOTE — Telephone Encounter (Signed)
Pt instructed to resume magnesium tablets BID.

## 2014-09-01 NOTE — Progress Notes (Addendum)
[place] [phone]   Fax:([phone]) [facility] [address] [employer] [address] [place] [address]  DIAGNOSIS: Stage IV (T2, N2, M1a) non-small cell lung cancer, squamous cell carcinoma presented with central left upper lobe lung mass with associated postobstructive atelectasis as well as ipsilateral and subcarinal lymphadenopathy as well as pleural tumor spread diagnosed in October 2015.  PRIOR THERAPY:  1) Systemic chemotherapy with carboplatin for AUC of 5 on day 1 and Abraxane 100 MG/M2 on days 1, 8 and 15 every 3 weeks. Status post 6 cycles.  2) Nivolumab per research protocol BMS 370 group B, arm A, given every 2 weeks. Status post 7 cycles. Last dose was given 08/13/2014 discontinued on 08/28/2014 secondary to disease progression.  CURRENT THERAPY: Systemic chemotherapy with docetaxel 75 MG/M2 and Cyramza 10 MG/KG every 3 weeks. First dose 09/10/2014.  INTERVAL HISTORY: Tammie Clay 69 y.o. female returns to the clinic today for follow-up visit accompanied by her significant other and daughter. The patient completed 7 cycles of treatment with immunotherapy with Nivolumab and this was discontinued recently secondary to disease progression. She is here today for evaluation and discussion of her treatment options.. She denied having any significant fever or chills. The patient denied having any significant chest pain, shortness of breath, cough or hemoptysis. She has no nausea or vomiting.  MEDICAL HISTORY: Past Medical History  Diagnosis Date  . Hypertension   . COPD (chronic obstructive pulmonary disease)   . Hyperlipidemia   . GERD (gastroesophageal reflux disease)   . Osteopenia   . Insomnia   . Mediastinal lymphadenopathy 11/19/13    PER CT  . Multiple lung nodules on CT 11/19/13    LEFT LUNG BASE  . Lung mass 11/19/13    RUL PER CT  . Coronary artery calcification seen on CAT scan 11/21/2013  .  COPD (chronic obstructive pulmonary disease) 11/21/2013  . Pneumonia     hx of  . Anxiety   . stage iv sq cell lung ca dx'd 11/2013    Recist     ALLERGIES:  has No Known Allergies.  MEDICATIONS:  Current Outpatient Prescriptions  Medication Sig Dispense Refill  . acetaminophen (TYLENOL) 325 MG tablet Take 650 mg by mouth daily as needed for moderate pain or headache. Take 2 tablets every morning    . albuterol (PROVENTIL HFA;VENTOLIN HFA) 108 (90 BASE) MCG/ACT inhaler Inhale 2 puffs into the lungs every 6 (six) hours as needed for wheezing.    . calcium carbonate (TUMS - DOSED IN MG ELEMENTAL CALCIUM) 500 MG chewable tablet Chew 1 tablet by mouth daily as needed for indigestion or heartburn.     . diazepam (VALIUM) 5 MG tablet Take 5 mg by mouth at bedtime.    . diphenoxylate-atropine (LOMOTIL) 2.5-0.025 MG per tablet Take 2 tablets by mouth 4 (four) times daily as needed for diarrhea or loose stools. 45 tablet 0  . docusate sodium (COLACE) 100 MG capsule Take 100 mg by mouth 2 (two) times daily as needed for moderate constipation.     . DULoxetine (CYMBALTA) 30 MG capsule TAKE 1 CAPSULE (30 MG TOTAL) BY MOUTH DAILY. 30 capsule 2  . HYDROcodone-acetaminophen (NORCO) 10-325 MG per tablet Take 1 tablet by mouth every 6 (six) hours as needed for moderate pain or severe pain.     Marland Kitchen lidocaine-prilocaine (EMLA) cream Apply 1 application topically as needed. 30 g 0  . lisinopril-hydrochlorothiazide (PRINZIDE,ZESTORETIC)  20-12.5 MG per tablet Take 2 tablets by mouth every morning.     . loperamide (IMODIUM) 2 MG capsule Take by mouth as needed for diarrhea or loose stools.    Marland Kitchen loratadine (CLARITIN) 10 MG tablet Take 10 mg by mouth daily.    . magnesium oxide (MAG-OX) 400 (241.3 MG) MG tablet Take 1 tablet (400 mg total) by mouth 2 (two) times daily. 60 tablet 0  . methimazole (TAPAZOLE) 10 MG tablet Take 10 mg by mouth 2 (two) times daily.    . metoprolol succinate (TOPROL-XL) 50 MG 24 hr tablet  Take 50 mg by mouth every morning. Take with or immediately following a meal.    . Multiple Vitamin (MULTIVITAMIN WITH MINERALS) TABS Take 1 tablet by mouth daily.    . polyethylene glycol (MIRALAX / GLYCOLAX) packet Take 17 g by mouth daily as needed for moderate constipation or severe constipation.     . prochlorperazine (COMPAZINE) 10 MG tablet Take 1 tablet (10 mg total) by mouth every 6 (six) hours as needed for nausea or vomiting. 30 tablet 1  . ranitidine (ZANTAC) 150 MG tablet Take 150 mg by mouth 2 (two) times daily as needed for heartburn.     . SYMBICORT 80-4.5 MCG/ACT inhaler Inhale 2 puffs into the lungs 2 (two) times daily.   3   No current facility-administered medications for this visit.    SURGICAL HISTORY:  Past Surgical History  Procedure Laterality Date  .  c section x 2    . Urethral cyst removed    . Tonsillectomy    . Appendectomy    . Colonoscopy with propofol N/A 07/17/2012    Procedure: COLONOSCOPY WITH PROPOFOL;  Surgeon: Garlan Fair, MD;  Location: [facility];  Service: Endoscopy;  Laterality: N/A;  . Esophagogastroduodenoscopy (egd) with propofol N/A 07/17/2012    Procedure: ESOPHAGOGASTRODUODENOSCOPY (EGD) WITH PROPOFOL;  Surgeon: Garlan Fair, MD;  Location: [facility];  Service: Endoscopy;  Laterality: N/A;  . Video bronchoscopy with endobronchial ultrasound N/A 12/03/2013    Procedure: VIDEO BRONCHOSCOPY WITH ENDOBRONCHIAL ULTRASOUND WITH BIOPSIES;  Surgeon: Grace Isaac, MD;  Location: [facility];  Service: Thoracic;  Laterality: N/A;  . Portacath placement Left 12/10/2013    Procedure: INSERTION PORT-A-CATH;  Surgeon: Grace Isaac, MD;  Location: [facility];  Service: Thoracic;  Laterality: Left;    REVIEW OF SYSTEMS:  Constitutional: negative Eyes: negative Ears, nose, mouth, throat, and face: negative Respiratory: negative Cardiovascular: negative Gastrointestinal: negative Genitourinary:negative Integument/breast:  negative Hematologic/lymphatic: negative Musculoskeletal:negative Neurological: positive for paresthesia Behavioral/Psych: positive for anxiety Endocrine: negative Allergic/Immunologic: negative   PHYSICAL EXAMINATION: General appearance: alert, cooperative, fatigued and no distress Head: Normocephalic, without obvious abnormality, atraumatic Neck: no adenopathy, no JVD, supple, symmetrical, trachea midline and thyroid not enlarged, symmetric, no tenderness/mass/nodules Lymph nodes: Cervical, supraclavicular, and axillary nodes normal. Resp: clear to auscultation bilaterally Back: symmetric, no curvature. ROM normal. No CVA tenderness. Cardio: regular rate and rhythm, S1, S2 normal, no murmur, click, rub or gallop GI: soft, non-tender; bowel sounds normal; no masses,  no organomegaly Genitalia: defer exam Extremities: extremities normal, atraumatic, no cyanosis or edema Neurologic: Alert and oriented X 3, normal strength and tone. Normal symmetric reflexes. Normal coordination and gait  ECOG PERFORMANCE STATUS: 1 - Symptomatic but completely ambulatory  There were no vitals taken for this visit.  LABORATORY DATA: Lab Results  Component Value Date   WBC 7.1 09/01/2014   HGB 11.6 09/01/2014   HCT 35.4 09/01/2014  MCV 87.1 09/01/2014   PLT 302 09/01/2014      Chemistry      Component Value Date/Time   NA 139 08/27/2014 1039   NA 135 07/19/2014 1607   K 4.0 08/27/2014 1039   K 3.4* 07/19/2014 1607   CL 99* 07/19/2014 1607   CO2 30* 08/27/2014 1039   CO2 28 07/19/2014 1607   BUN 20.1 08/27/2014 1039   BUN 21* 07/19/2014 1607   CREATININE 1.1 08/27/2014 1039   CREATININE 1.01* 07/19/2014 1607   CREATININE 0.80 11/22/2013 1054      Component Value Date/Time   CALCIUM 9.9 08/27/2014 1039   CALCIUM 8.7* 07/19/2014 1607   ALKPHOS 72 08/27/2014 1039   ALKPHOS 77 12/10/2013 0637   AST 13 08/27/2014 1039   AST 16 12/10/2013 0637   ALT 13 08/27/2014 1039   ALT 16  12/10/2013 0637   BILITOT 0.38 08/27/2014 1039   BILITOT 0.4 12/10/2013 0637       RADIOGRAPHIC STUDIES: Ct Chest W Contrast  08/28/2014   CLINICAL DATA:  Stage IV squamous cell lung carcinoma on chemotherapy. Shortness of breath.  EXAM: CT CHEST, ABDOMEN, AND PELVIS WITH CONTRAST  TECHNIQUE: Multidetector CT imaging of the chest, abdomen and pelvis was performed following the standard protocol during bolus administration of intravenous contrast.  CONTRAST:  12m OMNIPAQUE IOHEXOL 300 MG/ML  SOLN  COMPARISON:  Most recent CT chest, abdomen and pelvis from 07/03/2014.  FINDINGS: RECIST 1.1  Target Lesions:  1. Left mediastinum, soft tissue thickening: 2.6 x 2.6 cm (series 2/image 30), previously 2.6 x 2.3 cm 2. Right middle lobe branching nodularity: Stable unmeasurable focal scarring in the posterior right middle lobe associated with the right major fissure. 3. Left upper lobe nodule: Left upper lobe 0.6 cm pulmonary nodule (4/22), previously 0.6 cm Non-target Lesions:  1. Subcarinal node:  2.4 cm short axis (2/29), previously 1.8 cm 2. Right paraesophageal node: 1.6 cm short axis (2/33), previously 1.3 cm 3. Left infrahilar node: 1.1 cm short axis (2/32), previously 1.0 cm 4. Left lower lobe subpleural nodule: 0.6 cm nodule (4/36), previously 0.4 cm  CT CHEST FINDINGS  Mediastinum/Nodes: No axillary adenopathy. There are multiple enlarging pretracheal, subcarinal and paraesophageal mediastinal nodes. For example a 1.3 cm short axis upper pretracheal node (2/19) previously measured 0.8 cm, increased. A precarinal 1.5 cm short axis node (2/25) previously measured 1.1 cm, increased. The subcarinal 2.4 cm node (2/29) has increased from 1.8 cm. The right lower paraesophageal 1.6 cm node (2/33) is increased from 1.3 cm. Mildly enlarged 1.2 cm right hilar node (2/30), previously 1.2 cm, unchanged. Mildly enlarged 1.1 cm left inferior hilar node (2/32), previously 1.0 cm, increased. The normal heart size. No  pericardial effusion. Atherosclerosis, including left main, left anterior descending, left circumflex and right coronary artery disease. Please note that although the presence of coronary artery calcium documents the presence of coronary artery disease, the severity of this disease and any potential stenosis cannot be assessed on this non-gated CT examination. Assessment for potential risk factor modification, dietary therapy or pharmacologic therapy may be warranted, if clinically indicated. Atherosclerotic nonaneurysmal thoracic aorta. Normal caliber pulmonary arteries. No central pulmonary emboli. Left internal jugular MediPort terminates in the lower third of the superior vena cava.  Lungs/Pleura: No pleural effusion. Moderate centrilobular emphysema, upper lobe predominant. The primary central left upper lobe 2.6 x 2.5 cm spiculated neoplasm (2/30) previously measured 2.6 x 2.3 cm, not appreciably changed. There are at least 11 scattered enlarging pulmonary  nodules in both lungs, basilar predominant, at least 5 of which are new, in keeping with progressive pulmonary metastases, the largest of which measures 0.9 cm (4/44) in the left lower lobe, previously 0.8 cm. Stable mild scarring in the medial right middle lobe.  Musculoskeletal: Moderate degenerative changes in the thoracic spine. No suspicious focal osseous lesions.  CT ABDOMEN PELVIS FINDINGS  Hepatobiliary: There are at least 8 subcentimeter hypodense lesions scattered throughout the liver, largest 0.6 cm in the posterior right liver lobe (2/63), which are too small to characterize, and are not appreciably changed since 11/19/2013. No new liver lesions. Normal gallbladder. No biliary ductal dilatation.  Pancreas: Normal.  Spleen: Normal.  Adrenals/Urinary Tract: Normal adrenals. 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 Subcentimeter hypodense lesion in the posterior interpolar right kidney, unchanged since 02/14/2014, too small to  characterize, statistically likely a benign renal cyst. No hydronephrosis. Minimally distended and grossly normal bladder.  Stomach/Bowel: There is a normal stomach. Normal caliber small and large bowel with no bowel wall thickening. Appendix is surgically absent. No pneumoperitoneum. No ascites.  Vascular/Lymphatic: No lymphadenopathy in the abdomen or pelvis. Stable 3.1 cm infrarenal abdominal aortic aneurysm with stable mild to moderate eccentric mural thrombus.  Reproductive: Stable heterogeneous uterus with scattered coarse internal calcifications, likely representing partially calcified fibroids. No right adnexal abnormality. Stable simple 4.6 cm left adnexal cystic structure (2/100).  Musculoskeletal: Mild degenerative changes in the lumbar spine. No suspicious focal osseous lesions. Subcentimeter sclerotic foci in the L2 and L3 vertebral bodies are unchanged since 02/09/2014 and likely benign bone islands.  IMPRESSION: 1. Progressive pulmonary metastases. 2. Progressive moderate mediastinal and mild bilateral hilar lymphadenopathy. 3. Stable central left upper lobe primary pulmonary malignancy. 4. No evidence of metastatic disease in the abdomen or pelvis. 5. Stable 3.1 cm infrarenal abdominal aortic aneurysm. 6. Stable simple 4.6 cm left adnexal cystic structure, likely benign.   Electronically Signed   By: Ilona Sorrel M.D.   On: 08/28/2014 10:40   Ct Abdomen Pelvis W Contrast  08/28/2014   CLINICAL DATA:  Stage IV squamous cell lung carcinoma on chemotherapy. Shortness of breath.  EXAM: CT CHEST, ABDOMEN, AND PELVIS WITH CONTRAST  TECHNIQUE: Multidetector CT imaging of the chest, abdomen and pelvis was performed following the standard protocol during bolus administration of intravenous contrast.  CONTRAST:  170m OMNIPAQUE IOHEXOL 300 MG/ML  SOLN  COMPARISON:  Most recent CT chest, abdomen and pelvis from 07/03/2014.  FINDINGS: RECIST 1.1  Target Lesions:  1. Left mediastinum, soft tissue thickening:  2.6 x 2.6 cm (series 2/image 30), previously 2.6 x 2.3 cm 2. Right middle lobe branching nodularity: Stable unmeasurable focal scarring in the posterior right middle lobe associated with the right major fissure. 3. Left upper lobe nodule: Left upper lobe 0.6 cm pulmonary nodule (4/22), previously 0.6 cm Non-target Lesions:  1. Subcarinal node:  2.4 cm short axis (2/29), previously 1.8 cm 2. Right paraesophageal node: 1.6 cm short axis (2/33), previously 1.3 cm 3. Left infrahilar node: 1.1 cm short axis (2/32), previously 1.0 cm 4. Left lower lobe subpleural nodule: 0.6 cm nodule (4/36), previously 0.4 cm  CT CHEST FINDINGS  Mediastinum/Nodes: No axillary adenopathy. There are multiple enlarging pretracheal, subcarinal and paraesophageal mediastinal nodes. For example a 1.3 cm short axis upper pretracheal node (2/19) previously measured 0.8 cm, increased. A precarinal 1.5 cm short axis node (2/25) previously measured 1.1 cm, increased. The subcarinal 2.4 cm node (2/29) has increased from 1.8 cm. The right lower paraesophageal 1.6  cm node (2/33) is increased from 1.3 cm. Mildly enlarged 1.2 cm right hilar node (2/30), previously 1.2 cm, unchanged. Mildly enlarged 1.1 cm left inferior hilar node (2/32), previously 1.0 cm, increased. The normal heart size. No pericardial effusion. Atherosclerosis, including left main, left anterior descending, left circumflex and right coronary artery disease. Please note that although the presence of coronary artery calcium documents the presence of coronary artery disease, the severity of this disease and any potential stenosis cannot be assessed on this non-gated CT examination. Assessment for potential risk factor modification, dietary therapy or pharmacologic therapy may be warranted, if clinically indicated. Atherosclerotic nonaneurysmal thoracic aorta. Normal caliber pulmonary arteries. No central pulmonary emboli. Left internal jugular MediPort terminates in the lower third of  the superior vena cava.  Lungs/Pleura: No pleural effusion. Moderate centrilobular emphysema, upper lobe predominant. The primary central left upper lobe 2.6 x 2.5 cm spiculated neoplasm (2/30) previously measured 2.6 x 2.3 cm, not appreciably changed. There are at least 11 scattered enlarging pulmonary nodules in both lungs, basilar predominant, at least 5 of which are new, in keeping with progressive pulmonary metastases, the largest of which measures 0.9 cm (4/44) in the left lower lobe, previously 0.8 cm. Stable mild scarring in the medial right middle lobe.  Musculoskeletal: Moderate degenerative changes in the thoracic spine. No suspicious focal osseous lesions.  CT ABDOMEN PELVIS FINDINGS  Hepatobiliary: There are at least 8 subcentimeter hypodense lesions scattered throughout the liver, largest 0.6 cm in the posterior right liver lobe (2/63), which are too small to characterize, and are not appreciably changed since 11/19/2013. No new liver lesions. Normal gallbladder. No biliary ductal dilatation.  Pancreas: Normal.  Spleen: Normal.  Adrenals/Urinary Tract: Normal adrenals. 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 Subcentimeter hypodense lesion in the posterior interpolar right kidney, unchanged since 02/14/2014, too small to characterize, statistically likely a benign renal cyst. No hydronephrosis. Minimally distended and grossly normal bladder.  Stomach/Bowel: There is a normal stomach. Normal caliber small and large bowel with no bowel wall thickening. Appendix is surgically absent. No pneumoperitoneum. No ascites.  Vascular/Lymphatic: No lymphadenopathy in the abdomen or pelvis. Stable 3.1 cm infrarenal abdominal aortic aneurysm with stable mild to moderate eccentric mural thrombus.  Reproductive: Stable heterogeneous uterus with scattered coarse internal calcifications, likely representing partially calcified fibroids. No right adnexal abnormality. Stable simple 4.6 cm left adnexal cystic  structure (2/100).  Musculoskeletal: Mild degenerative changes in the lumbar spine. No suspicious focal osseous lesions. Subcentimeter sclerotic foci in the L2 and L3 vertebral bodies are unchanged since 02/09/2014 and likely benign bone islands.  IMPRESSION: 1. Progressive pulmonary metastases. 2. Progressive moderate mediastinal and mild bilateral hilar lymphadenopathy. 3. Stable central left upper lobe primary pulmonary malignancy. 4. No evidence of metastatic disease in the abdomen or pelvis. 5. Stable 3.1 cm infrarenal abdominal aortic aneurysm. 6. Stable simple 4.6 cm left adnexal cystic structure, likely benign.   Electronically Signed   By: Ilona Sorrel M.D.   On: 08/28/2014 10:40    ASSESSMENT AND PLAN: This is a very pleasant 69 years old white female recently diagnosed with:  1) Stage IV non-small cell lung cancer, squamous cell carcinoma completed systemic chemotherapy with carboplatin and Abraxane status post 6 cycles.  She is currently on treatment with immunotherapy according to the BMS checkmate 370 clinical trial with Nivolumab.she status post 7 cycles and tolerating her last treatment fairly well with no significant diarrhea.  Unfortunately the recent CT scan of the chest, abdomen and pelvis showed  evidence for disease progression. I discussed the scan results and showed the images to the patient and her family. I discussed with her several options for treatment of her condition including treatment with docetaxel and Cyramza versus single agent gemcitabine versus treatment with single agent Abraxane. After discussion of all the options and the adverse effect, the patient would like to proceed with treatment with docetaxel and Cyramza. She was given handouts about the medications and expected adverse effects. She is expected to start the first cycle of this treatment next week. I will call her pharmacy with prescription for Decadron 8 mg by mouth twice a day the day before, day of and  day after the chemotherapy every 3 weeks. She will come back for follow-up visit in 4 weeks with the start of cycle #2.  2) Hypomagnesemia, she received magnesium sulfate last week and we will continue to monitor her level closely.  3) Hyperthyroidism, the patient will continue her treatment with methimazole and follow-up visit by her endocrinologist.  4) For the peripheral neuropathy, she has been on Neurontin in the past but this was discontinued secondary to diarrhea and she did not notice any improvement in her peripheral neuropathy while taking the medication. The patient was advised to call immediately if she has any concerning symptoms in the interval.  The patient voices understanding of current disease status and treatment options and is in agreement with the current care plan.  All questions were answered. The patient knows to call the clinic with any problems, questions or concerns. We can certainly see the patient much sooner if necessary.  Disclaimer: This note was dictated with voice recognition software. Similar sounding words can inadvertently be transcribed and may not be corrected upon review.

## 2014-09-01 NOTE — Telephone Encounter (Signed)
per pof to sch pt appt-sent MW emailt o sch trmt-gave pt copy of avs-pt stated has MY CHART and will review complete sch later no need to call

## 2014-09-01 NOTE — Telephone Encounter (Signed)
Per staff message and POF I have scheduled appts. Advised scheduler of appts. JMW  

## 2014-09-02 LAB — T3, FREE: T3, Free: 2.8 pg/mL (ref 2.3–4.2)

## 2014-09-02 LAB — T4, FREE: FREE T4: 1.04 ng/dL (ref 0.80–1.80)

## 2014-09-03 ENCOUNTER — Other Ambulatory Visit: Payer: Self-pay | Admitting: Medical Oncology

## 2014-09-03 ENCOUNTER — Other Ambulatory Visit: Payer: Self-pay | Admitting: Nurse Practitioner

## 2014-09-03 DIAGNOSIS — C3492 Malignant neoplasm of unspecified part of left bronchus or lung: Secondary | ICD-10-CM

## 2014-09-03 DIAGNOSIS — R195 Other fecal abnormalities: Secondary | ICD-10-CM

## 2014-09-03 MED ORDER — DIPHENOXYLATE-ATROPINE 2.5-0.025 MG PO TABS: 2.0000 | ORAL_TABLET | Freq: Four times a day (QID) | ORAL | Status: DC | PRN

## 2014-09-03 NOTE — Telephone Encounter (Signed)
I verified what pt needs for diarrhea , imodium or lomotil. Significant other will have Aneita  call me back.

## 2014-09-03 NOTE — Progress Notes (Signed)
Called in lomotil . Unable to leave message on pt phone -it rang multiple times.

## 2014-09-04 ENCOUNTER — Telehealth: Payer: Self-pay | Admitting: *Deleted

## 2014-09-04 NOTE — Telephone Encounter (Signed)
I told pt I called in lomotil yesterday . She is having 3 stools a day on antidiarrheals , taking mag oxide 2/day. She says she is replacing fluids. She correlates it with starting magnesium. Next mag level wed.

## 2014-09-04 NOTE — Telephone Encounter (Signed)
I called pt and told her to stop Mag oxide.

## 2014-09-04 NOTE — Telephone Encounter (Signed)
Called patient as requested on voicemail to "discuss a diarrhea problem".  "Monday I started having diarrhea.  I start the day with a soft stool that is diarrhea the next time I go.  I have no more than three stools per day but it is everyday.  I alternate imodium and the prescription medicine.  I do not have any cramps, bloating or swelling.  I drink lots of water and Gatorade and do not know how it feels to be dehydrated and am trying to avoid this."  Will notify Dr. [name].  This nurse advised she avoid high fiber foods.  Also avoid spicy, greasy, sugary foods and beverages.  Drink 64 oz water daily.

## 2014-09-08 ENCOUNTER — Telehealth: Payer: Self-pay | Admitting: Medical Oncology

## 2014-09-08 NOTE — Telephone Encounter (Signed)
I called pt from weekend call -she was sleeping. S.O reported she only had one diarrheal stool today. I instructed him to have her call back if it worsens.

## 2014-09-09 ENCOUNTER — Other Ambulatory Visit: Payer: Self-pay | Admitting: Medical Oncology

## 2014-09-10 ENCOUNTER — Ambulatory Visit (HOSPITAL_BASED_OUTPATIENT_CLINIC_OR_DEPARTMENT_OTHER): Payer: Medicare Other

## 2014-09-10 ENCOUNTER — Other Ambulatory Visit (HOSPITAL_BASED_OUTPATIENT_CLINIC_OR_DEPARTMENT_OTHER): Payer: Medicare Other

## 2014-09-10 ENCOUNTER — Telehealth: Payer: Self-pay | Admitting: Internal Medicine

## 2014-09-10 VITALS — BP 136/70 | HR 62 | Temp 97.9°F | Resp 18

## 2014-09-10 DIAGNOSIS — Z5111 Encounter for antineoplastic chemotherapy: Secondary | ICD-10-CM

## 2014-09-10 DIAGNOSIS — C3412 Malignant neoplasm of upper lobe, left bronchus or lung: Secondary | ICD-10-CM

## 2014-09-10 DIAGNOSIS — Z79899 Other long term (current) drug therapy: Secondary | ICD-10-CM

## 2014-09-10 DIAGNOSIS — Z5112 Encounter for antineoplastic immunotherapy: Secondary | ICD-10-CM | POA: Diagnosis not present

## 2014-09-10 DIAGNOSIS — C3492 Malignant neoplasm of unspecified part of left bronchus or lung: Secondary | ICD-10-CM

## 2014-09-10 LAB — COMPREHENSIVE METABOLIC PANEL (CC13)
ALK PHOS: 70 U/L (ref 40–150)
ALT: 13 U/L (ref 0–55)
AST: 12 U/L (ref 5–34)
Albumin: 3.8 g/dL (ref 3.5–5.0)
Anion Gap: 12 mEq/L — ABNORMAL HIGH (ref 3–11)
BILIRUBIN TOTAL: 0.23 mg/dL (ref 0.20–1.20)
BUN: 20 mg/dL (ref 7.0–26.0)
CO2: 25 meq/L (ref 22–29)
Calcium: 9.5 mg/dL (ref 8.4–10.4)
Chloride: 96 mEq/L — ABNORMAL LOW (ref 98–109)
Creatinine: 1.1 mg/dL (ref 0.6–1.1)
EGFR: 51 mL/min/{1.73_m2} — ABNORMAL LOW (ref >90)
GLUCOSE: 158 mg/dL — AB (ref 70–140)
POTASSIUM: 4.3 meq/L (ref 3.5–5.1)
Sodium: 133 mEq/L — ABNORMAL LOW (ref 136–145)
TOTAL PROTEIN: 7.2 g/dL (ref 6.4–8.3)

## 2014-09-10 LAB — CBC WITH DIFFERENTIAL/PLATELET
BASO%: 0.1 % (ref 0.0–2.0)
BASOS ABS: 0 10*3/uL (ref 0.0–0.1)
EOS ABS: 0 10*3/uL (ref 0.0–0.5)
EOS%: 0 % (ref 0.0–7.0)
HEMATOCRIT: 34.4 % — AB (ref 34.8–46.6)
HGB: 11.2 g/dL — ABNORMAL LOW (ref 11.6–15.9)
LYMPH%: 8.2 % — ABNORMAL LOW (ref 14.0–49.7)
MCH: 28.3 pg (ref 25.1–34.0)
MCHC: 32.5 g/dL (ref 31.5–36.0)
MCV: 87.1 fL (ref 79.5–101.0)
MONO#: 0.3 10*3/uL (ref 0.1–0.9)
MONO%: 2.9 % (ref 0.0–14.0)
NEUT#: 10.4 10*3/uL — ABNORMAL HIGH (ref 1.5–6.5)
NEUT%: 88.8 % — ABNORMAL HIGH (ref 38.4–76.8)
Platelets: 325 10*3/uL (ref 145–400)
RBC: 3.95 10*6/uL (ref 3.70–5.45)
RDW: 15.8 % — AB (ref 11.2–14.5)
WBC: 11.7 10*3/uL — ABNORMAL HIGH (ref 3.9–10.3)
lymph#: 1 10*3/uL (ref 0.9–3.3)

## 2014-09-10 LAB — UA PROTEIN, DIPSTICK - CHCC: Protein, ur: NEGATIVE mg/dL

## 2014-09-10 LAB — MAGNESIUM (CC13): Magnesium: 1.4 mg/dl — CL (ref 1.5–2.5)

## 2014-09-10 MED ORDER — ACETAMINOPHEN 325 MG PO TABS
ORAL_TABLET | ORAL | Status: AC
  Filled 2014-09-10: qty 2

## 2014-09-10 MED ORDER — SODIUM CHLORIDE 0.9 % IV SOLN
10.0000 mg/kg | Freq: Once | INTRAVENOUS | Status: AC
  Administered 2014-09-10: 700 mg via INTRAVENOUS
  Filled 2014-09-10: qty 70

## 2014-09-10 MED ORDER — ACETAMINOPHEN 325 MG PO TABS
650.0000 mg | ORAL_TABLET | Freq: Once | ORAL | Status: AC
  Administered 2014-09-10: 650 mg via ORAL

## 2014-09-10 MED ORDER — DOCETAXEL CHEMO INJECTION 160 MG/16ML
75.0000 mg/m2 | Freq: Once | INTRAVENOUS | Status: AC
  Administered 2014-09-10: 130 mg via INTRAVENOUS
  Filled 2014-09-10: qty 13

## 2014-09-10 MED ORDER — DIPHENHYDRAMINE HCL 50 MG/ML IJ SOLN
50.0000 mg | Freq: Once | INTRAMUSCULAR | Status: AC
  Administered 2014-09-10: 50 mg via INTRAVENOUS

## 2014-09-10 MED ORDER — SODIUM CHLORIDE 0.9 % IV SOLN
Freq: Once | INTRAVENOUS | Status: AC
  Administered 2014-09-10: 14:00:00 via INTRAVENOUS
  Filled 2014-09-10: qty 4

## 2014-09-10 MED ORDER — HEPARIN SOD (PORK) LOCK FLUSH 100 UNIT/ML IV SOLN
500.0000 [IU] | Freq: Once | INTRAVENOUS | Status: AC | PRN
  Administered 2014-09-10: 500 [IU]
  Filled 2014-09-10: qty 5

## 2014-09-10 MED ORDER — DIPHENHYDRAMINE HCL 50 MG/ML IJ SOLN
INTRAMUSCULAR | Status: AC
Start: 2014-09-10 — End: 2014-09-10
  Filled 2014-09-10: qty 1

## 2014-09-10 MED ORDER — SODIUM CHLORIDE 0.9 % IV SOLN
Freq: Once | INTRAVENOUS | Status: AC
  Administered 2014-09-10: 12:00:00 via INTRAVENOUS

## 2014-09-10 MED ORDER — SODIUM CHLORIDE 0.9 % IJ SOLN
10.0000 mL | INTRAMUSCULAR | Status: DC | PRN
  Administered 2014-09-10: 10 mL
  Filled 2014-09-10: qty 10

## 2014-09-10 NOTE — Patient Instructions (Signed)
[facility] Discharge Instructions for Patients Receiving Chemotherapy  Today you received the following chemotherapy agents: Cyramza, Taxotere  To help prevent nausea and vomiting after your treatment, we encourage you to take your nausea medication as directed.    If you develop nausea and vomiting that is not controlled by your nausea medication, call the clinic.   BELOW ARE SYMPTOMS THAT SHOULD BE REPORTED IMMEDIATELY:  *FEVER GREATER THAN 100.5 F  *CHILLS WITH OR WITHOUT FEVER  NAUSEA AND VOMITING THAT IS NOT CONTROLLED WITH YOUR NAUSEA MEDICATION  *UNUSUAL SHORTNESS OF BREATH  *UNUSUAL BRUISING OR BLEEDING  TENDERNESS IN MOUTH AND THROAT WITH OR WITHOUT PRESENCE OF ULCERS  *URINARY PROBLEMS  *BOWEL PROBLEMS  UNUSUAL RASH Items with * indicate a potential emergency and should be followed up as soon as possible.  Feel free to call the clinic you have any questions or concerns. The clinic phone number is [phone].  Please show the [facility] at check-in to the Emergency Department and triage nurse.   Ramucirumab injection What is this medicine? RAMUCIRUMAB (ra mue SIR ue mab) is a chemotherapy drug. It is used to treat stomach cancer, colorectal cancer, or lung cancer. This drug targets a specific protein receptor on cancer cells and stops the cancer cells from growing. This medicine may be used for other purposes; ask your health care provider or pharmacist if you have questions. COMMON BRAND NAME(S): Cyramza What should I tell my health care provider before I take this medicine? They need to know if you have any of these conditions: -bleeding disorders -blood clots -heart disease, including heart failure, heart attack, or chest pain (angina) -high blood pressure -infection (especially a virus infection such as chickenpox, cold sores, or herpes) -protein in your urine -recent surgery -stroke -an unusual or allergic reaction to  ramucirumab, other medicines, foods, dyes, or preservatives -pregnant or trying to get pregnant -breast-feeding How should I use this medicine? This medicine is for infusion into a vein. It is given by a health care professional in a hospital or clinic setting. Talk to your pediatrician regarding the use of this medicine in children. Special care may be needed. Overdosage: If you think you've taken too much of this medicine contact a poison control center or emergency room at once. Overdosage: If you think you have taken too much of this medicine contact a poison control center or emergency room at once. NOTE: This medicine is only for you. Do not share this medicine with others. What if I miss a dose? It is important not to miss your dose. Call your doctor or health care professional if you are unable to keep an appointment. What may interact with this medicine? Interactions have not been studied. This list may not describe all possible interactions. Give your health care provider a list of all the medicines, herbs, non-prescription drugs, or dietary supplements you use. Also tell them if you smoke, drink alcohol, or use illegal drugs. Some items may interact with your medicine. What should I watch for while using this medicine? Your condition will be monitored carefully while you are receiving this medicine. You will need to to check your blood pressure and have your blood and urine tested while you are taking this medicine. Your condition will be monitored carefully while you are receiving this medicine. This medicine may increase your risk to bruise or bleed. Call your doctor or health care professional if you notice any unusual bleeding. This medicine may rarely cause '  gastrointestinal perforation' (holes in the stomach, intestines or colon), a serious side effect requiring surgery to repair. This medicine should be started at least 28 days following major surgery and the site of the surgery  should be totally healed. Check with your doctor before scheduling dental work or surgery while you are receiving this treatment. Talk to your doctor if you have recently had surgery or if you have a wound that has not healed. Do not become pregnant while taking this medicine or for 3 months after stopping it. Women should inform their doctor if they wish to become pregnant or think they might be pregnant. There is a potential for serious side effects to an unborn child. Talk to your health care professional or pharmacist for more information. What side effects may I notice from receiving this medicine? Side effects that you should report to your doctor or health care professional as soon as possible: -allergic reactions like skin rash, itching or hives, breathing problems, swelling of the face, lips, or tongue -signs of infection - fever or chills, cough, sore throat -chest pain or chest tightness -confusion -dizziness -feeling faint or lightheaded, falls -severe abdominal pain -severe nausea, vomiting -signs and symptoms of bleeding such as bloody or black, tarry stools; red or dark-brown urine; spitting up blood or brown material that looks like coffee grounds; red spots on the skin; unusual bruising or bleeding from the eye, gums, or nose -signs and symptoms of a blood clot such as breathing problems; changes in vision; chest pain; severe, sudden headache; pain, swelling, warmth in the leg; trouble speaking; sudden numbness or weakness of the face, arm or leg -symptoms of a stroke: change in mental awareness, inability to talk or move one side of the body -trouble walking, dizziness, loss of balance or coordination Side effects that usually do not require medical attention (Report these to your doctor or health care professional if they continue or are bothersome.): -cold, clammy skin -constipation -diarrhea -headache -nausea, vomiting -stomach pain -unusually slow heartbeat -unusually  weak or tired This list may not describe all possible side effects. Call your doctor for medical advice about side effects. You may report side effects to FDA at 1-800-FDA-1088. Where should I keep my medicine? This drug is given in a hospital or clinic and will not be stored at home. NOTE: This sheet is a summary. It may not cover all possible information. If you have questions about this medicine, talk to your doctor, pharmacist, or health care provider.  2015, Elsevier/Gold Standard. (2013-06-04 13:05:27)   Docetaxel injection What is this medicine? DOCETAXEL (doe se TAX el) is a chemotherapy drug. It targets fast dividing cells, like cancer cells, and causes these cells to die. This medicine is used to treat many types of cancers like breast cancer, certain stomach cancers, head and neck cancer, lung cancer, and prostate cancer. This medicine may be used for other purposes; ask your health care provider or pharmacist if you have questions. COMMON BRAND NAME(S): Docefrez, Taxotere What should I tell my health care provider before I take this medicine? They need to know if you have any of these conditions: -infection (especially a virus infection such as chickenpox, cold sores, or herpes) -liver disease -low blood counts, like low white cell, platelet, or red cell counts -an unusual or allergic reaction to docetaxel, polysorbate 80, other chemotherapy agents, other medicines, foods, dyes, or preservatives -pregnant or trying to get pregnant -breast-feeding How should I use this medicine? This drug is given as  an infusion into a vein. It is administered in a hospital or clinic by a specially trained health care professional. Talk to your pediatrician regarding the use of this medicine in children. Special care may be needed. Overdosage: If you think you have taken too much of this medicine contact a poison control center or emergency room at once. NOTE: This medicine is only for you. Do  not share this medicine with others. What if I miss a dose? It is important not to miss your dose. Call your doctor or health care professional if you are unable to keep an appointment. What may interact with this medicine? -cyclosporine -erythromycin -ketoconazole -medicines to increase blood counts like filgrastim, pegfilgrastim, sargramostim -vaccines Talk to your doctor or health care professional before taking any of these medicines: -acetaminophen -aspirin -ibuprofen -ketoprofen -naproxen This list may not describe all possible interactions. Give your health care provider a list of all the medicines, herbs, non-prescription drugs, or dietary supplements you use. Also tell them if you smoke, drink alcohol, or use illegal drugs. Some items may interact with your medicine. What should I watch for while using this medicine? Your condition will be monitored carefully while you are receiving this medicine. You will need important blood work done while you are taking this medicine. This drug may make you feel generally unwell. This is not uncommon, as chemotherapy can affect healthy cells as well as cancer cells. Report any side effects. Continue your course of treatment even though you feel ill unless your doctor tells you to stop. In some cases, you may be given additional medicines to help with side effects. Follow all directions for their use. Call your doctor or health care professional for advice if you get a fever, chills or sore throat, or other symptoms of a cold or flu. Do not treat yourself. This drug decreases your body's ability to fight infections. Try to avoid being around people who are sick. This medicine may increase your risk to bruise or bleed. Call your doctor or health care professional if you notice any unusual bleeding. Be careful brushing and flossing your teeth or using a toothpick because you may get an infection or bleed more easily. If you have any dental work done,  tell your dentist you are receiving this medicine. Avoid taking products that contain aspirin, acetaminophen, ibuprofen, naproxen, or ketoprofen unless instructed by your doctor. These medicines may hide a fever. This medicine contains an alcohol in the product. You may get drowsy or dizzy. Do not drive, use machinery, or do anything that needs mental alertness until you know how this medicine affects you. Do not stand or sit up quickly, especially if you are an older patient. This reduces the risk of dizzy or fainting spells. Avoid alcoholic drinks Do not become pregnant while taking this medicine. Women should inform their doctor if they wish to become pregnant or think they might be pregnant. There is a potential for serious side effects to an unborn child. Talk to your health care professional or pharmacist for more information. Do not breast-feed an infant while taking this medicine. What side effects may I notice from receiving this medicine? Side effects that you should report to your doctor or health care professional as soon as possible: -allergic reactions like skin rash, itching or hives, swelling of the face, lips, or tongue -low blood counts - This drug may decrease the number of white blood cells, red blood cells and platelets. You may be at increased risk for  infections and bleeding. -signs of infection - fever or chills, cough, sore throat, pain or difficulty passing urine -signs of decreased platelets or bleeding - bruising, pinpoint red spots on the skin, black, tarry stools, nosebleeds -signs of decreased red blood cells - unusually weak or tired, fainting spells, lightheadedness -breathing problems -fast or irregular heartbeat -low blood pressure -mouth sores -nausea and vomiting -pain, swelling, redness or irritation at the injection site -pain, tingling, numbness in the hands or feet -swelling of the ankle, feet, hands -weight gain Side effects that usually do not require  medical attention (report to your prescriber or health care professional if they continue or are bothersome): -bone pain -complete hair loss including hair on your head, underarms, pubic hair, eyebrows, and eyelashes -diarrhea -excessive tearing -changes in the color of fingernails -loosening of the fingernails -nausea -muscle pain -red flush to skin -sweating -weak or tired This list may not describe all possible side effects. Call your doctor for medical advice about side effects. You may report side effects to FDA at 1-800-FDA-1088. Where should I keep my medicine? This drug is given in a hospital or clinic and will not be stored at home. NOTE: This sheet is a summary. It may not cover all possible information. If you have questions about this medicine, talk to your doctor, pharmacist, or health care provider.  2015, Elsevier/Gold Standard. (2012-12-20 22:21:02)

## 2014-09-10 NOTE — Progress Notes (Signed)
Pt states Diarrhea is improving. Yesterday she had one regular and one episode of Diarrhea. And today she has not had a BM at this time.   Magnesium 1.4, Sodium 133. Dr. [name] aware okay to proceed with treatment. Orders obtained to collected a urine protein prior to starting treatment.

## 2014-09-10 NOTE — Telephone Encounter (Signed)
s.w. pt and advised on aug appts .Marland Kitchen..pt ok and aware

## 2014-09-11 ENCOUNTER — Telehealth: Payer: Self-pay | Admitting: Medical Oncology

## 2014-09-11 NOTE — Telephone Encounter (Signed)
-----   Message from Egbert Garibaldi, RN sent at 09/10/2014  3:50 PM EDT ----- Regarding: Dr [name] , chemo followup  Patient of Dr. Worthy Flank 1st time Cyramza and taxotere. Pt tolerated treatment well.

## 2014-09-11 NOTE — Telephone Encounter (Signed)
"  I feel pretty good, I am a little constipated and I will take miralax." Understands to call for any problems.

## 2014-09-12 ENCOUNTER — Ambulatory Visit (HOSPITAL_BASED_OUTPATIENT_CLINIC_OR_DEPARTMENT_OTHER): Payer: Medicare Other

## 2014-09-12 VITALS — BP 147/70 | HR 52 | Temp 98.0°F

## 2014-09-12 DIAGNOSIS — C3412 Malignant neoplasm of upper lobe, left bronchus or lung: Secondary | ICD-10-CM | POA: Diagnosis not present

## 2014-09-12 DIAGNOSIS — C3492 Malignant neoplasm of unspecified part of left bronchus or lung: Secondary | ICD-10-CM

## 2014-09-12 DIAGNOSIS — Z5189 Encounter for other specified aftercare: Secondary | ICD-10-CM

## 2014-09-12 MED ORDER — PEGFILGRASTIM INJECTION 6 MG/0.6ML ~~LOC~~
6.0000 mg | PREFILLED_SYRINGE | Freq: Once | SUBCUTANEOUS | Status: AC
  Administered 2014-09-12: 6 mg via SUBCUTANEOUS
  Filled 2014-09-12: qty 0.6

## 2014-09-12 NOTE — Patient Instructions (Signed)
Pegfilgrastim injection What is this medicine? PEGFILGRASTIM (peg fil GRA stim) is a long-acting granulocyte colony-stimulating factor that stimulates the growth of neutrophils, a type of white blood cell important in the body's fight against infection. It is used to reduce the incidence of fever and infection in patients with certain types of cancer who are receiving chemotherapy that affects the bone marrow. This medicine may be used for other purposes; ask your health care provider or pharmacist if you have questions. COMMON BRAND NAME(S): Neulasta What should I tell my health care provider before I take this medicine? They need to know if you have any of these conditions: -latex allergy -ongoing radiation therapy -sickle cell disease -skin reactions to acrylic adhesives (On-Body Injector only) -an unusual or allergic reaction to pegfilgrastim, filgrastim, other medicines, foods, dyes, or preservatives -pregnant or trying to get pregnant -breast-feeding How should I use this medicine? This medicine is for injection under the skin. If you get this medicine at home, you will be taught how to prepare and give the pre-filled syringe or how to use the On-body Injector. Refer to the patient Instructions for Use for detailed instructions. Use exactly as directed. Take your medicine at regular intervals. Do not take your medicine more often than directed. It is important that you put your used needles and syringes in a special sharps container. Do not put them in a trash can. If you do not have a sharps container, call your pharmacist or healthcare provider to get one. Talk to your pediatrician regarding the use of this medicine in children. Special care may be needed. Overdosage: If you think you have taken too much of this medicine contact a poison control center or emergency room at once. NOTE: This medicine is only for you. Do not share this medicine with others. What if I miss a dose? It is  important not to miss your dose. Call your doctor or health care professional if you miss your dose. If you miss a dose due to an On-body Injector failure or leakage, a new dose should be administered as soon as possible using a single prefilled syringe for manual use. What may interact with this medicine? Interactions have not been studied. Give your health care provider a list of all the medicines, herbs, non-prescription drugs, or dietary supplements you use. Also tell them if you smoke, drink alcohol, or use illegal drugs. Some items may interact with your medicine. This list may not describe all possible interactions. Give your health care provider a list of all the medicines, herbs, non-prescription drugs, or dietary supplements you use. Also tell them if you smoke, drink alcohol, or use illegal drugs. Some items may interact with your medicine. What should I watch for while using this medicine? You may need blood work done while you are taking this medicine. If you are going to need a MRI, CT scan, or other procedure, tell your doctor that you are using this medicine (On-Body Injector only). What side effects may I notice from receiving this medicine? Side effects that you should report to your doctor or health care professional as soon as possible: -allergic reactions like skin rash, itching or hives, swelling of the face, lips, or tongue -dizziness -fever -pain, redness, or irritation at site where injected -pinpoint red spots on the skin -shortness of breath or breathing problems -stomach or side pain, or pain at the shoulder -swelling -tiredness -trouble passing urine Side effects that usually do not require medical attention (report to your doctor   or health care professional if they continue or are bothersome): -bone pain -muscle pain This list may not describe all possible side effects. Call your doctor for medical advice about side effects. You may report side effects to FDA at  1-800-FDA-1088. Where should I keep my medicine? Keep out of the reach of children. Store pre-filled syringes in a refrigerator between 2 and 8 degrees C (36 and 46 degrees F). Do not freeze. Keep in carton to protect from light. Throw away this medicine if it is left out of the refrigerator for more than 48 hours. Throw away any unused medicine after the expiration date. NOTE: This sheet is a summary. It may not cover all possible information. If you have questions about this medicine, talk to your doctor, pharmacist, or health care provider.  2015, Elsevier/Gold Standard. (2013-04-25 16:14:05)  

## 2014-09-16 ENCOUNTER — Other Ambulatory Visit: Payer: Self-pay | Admitting: Medical Oncology

## 2014-09-16 DIAGNOSIS — C3492 Malignant neoplasm of unspecified part of left bronchus or lung: Secondary | ICD-10-CM

## 2014-09-17 ENCOUNTER — Other Ambulatory Visit: Payer: Medicare Other

## 2014-09-17 ENCOUNTER — Ambulatory Visit (HOSPITAL_BASED_OUTPATIENT_CLINIC_OR_DEPARTMENT_OTHER): Payer: Medicare Other

## 2014-09-17 ENCOUNTER — Telehealth: Payer: Self-pay | Admitting: Internal Medicine

## 2014-09-17 ENCOUNTER — Encounter: Payer: Self-pay | Admitting: Nurse Practitioner

## 2014-09-17 ENCOUNTER — Encounter: Payer: Self-pay | Admitting: Internal Medicine

## 2014-09-17 ENCOUNTER — Other Ambulatory Visit (HOSPITAL_BASED_OUTPATIENT_CLINIC_OR_DEPARTMENT_OTHER): Payer: Medicare Other

## 2014-09-17 ENCOUNTER — Other Ambulatory Visit (HOSPITAL_COMMUNITY)
Admission: RE | Admit: 2014-09-17 | Discharge: 2014-09-17 | Disposition: A | Discharge status: Home / Self Care | Payer: Medicare Other | Source: Ambulatory Visit | Attending: Internal Medicine | Admitting: Internal Medicine

## 2014-09-17 ENCOUNTER — Encounter: Payer: Self-pay | Admitting: Medical Oncology

## 2014-09-17 ENCOUNTER — Ambulatory Visit (HOSPITAL_BASED_OUTPATIENT_CLINIC_OR_DEPARTMENT_OTHER): Payer: Medicare Other | Admitting: Nurse Practitioner

## 2014-09-17 ENCOUNTER — Other Ambulatory Visit: Payer: Self-pay | Admitting: *Deleted

## 2014-09-17 VITALS — BP 103/55 | HR 88 | Temp 99.2°F | Resp 18 | Ht 62.0 in | Wt 156.2 lb

## 2014-09-17 DIAGNOSIS — Z006 Encounter for examination for normal comparison and control in clinical research program: Secondary | ICD-10-CM

## 2014-09-17 DIAGNOSIS — G4700 Insomnia, unspecified: Secondary | ICD-10-CM

## 2014-09-17 DIAGNOSIS — C782 Secondary malignant neoplasm of pleura: Secondary | ICD-10-CM | POA: Diagnosis not present

## 2014-09-17 DIAGNOSIS — C3492 Malignant neoplasm of unspecified part of left bronchus or lung: Secondary | ICD-10-CM

## 2014-09-17 DIAGNOSIS — C3412 Malignant neoplasm of upper lobe, left bronchus or lung: Secondary | ICD-10-CM | POA: Diagnosis not present

## 2014-09-17 DIAGNOSIS — R197 Diarrhea, unspecified: Secondary | ICD-10-CM

## 2014-09-17 LAB — CBC WITH DIFFERENTIAL/PLATELET
BASO%: 0.4 % (ref 0.0–2.0)
BASOS ABS: 0 10*3/uL (ref 0.0–0.1)
EOS ABS: 0 10*3/uL (ref 0.0–0.5)
EOS%: 0.3 % (ref 0.0–7.0)
HEMATOCRIT: 34.6 % — AB (ref 34.8–46.6)
HGB: 11.9 g/dL (ref 11.6–15.9)
LYMPH#: 2 10*3/uL (ref 0.9–3.3)
LYMPH%: 19.5 % (ref 14.0–49.7)
MCH: 28.9 pg (ref 25.1–34.0)
MCHC: 34.4 g/dL (ref 31.5–36.0)
MCV: 84 fL (ref 79.5–101.0)
MONO#: 1.4 10*3/uL — ABNORMAL HIGH (ref 0.1–0.9)
MONO%: 13.4 % (ref 0.0–14.0)
NEUT#: 6.7 10*3/uL — ABNORMAL HIGH (ref 1.5–6.5)
NEUT%: 66.4 % (ref 38.4–76.8)
NRBC: 1 % — AB (ref 0–0)
Platelets: 229 10*3/uL (ref 145–400)
RBC: 4.12 10*6/uL (ref 3.70–5.45)
RDW: 14.8 % — ABNORMAL HIGH (ref 11.2–14.5)
WBC: 10.1 10*3/uL (ref 3.9–10.3)

## 2014-09-17 LAB — MAGNESIUM (CC13): MAGNESIUM: 1.3 mg/dL — AB (ref 1.5–2.5)

## 2014-09-17 LAB — COMPREHENSIVE METABOLIC PANEL (CC13)
ALT: 22 U/L (ref 0–55)
AST: 19 U/L (ref 5–34)
Albumin: 3.5 g/dL (ref 3.5–5.0)
Alkaline Phosphatase: 77 U/L (ref 40–150)
Anion Gap: 10 mEq/L (ref 3–11)
BUN: 18.1 mg/dL (ref 7.0–26.0)
CALCIUM: 9.2 mg/dL (ref 8.4–10.4)
CO2: 30 mEq/L — ABNORMAL HIGH (ref 22–29)
CREATININE: 1.2 mg/dL — AB (ref 0.6–1.1)
Chloride: 95 mEq/L — ABNORMAL LOW (ref 98–109)
EGFR: 45 mL/min/{1.73_m2} — ABNORMAL LOW (ref >90)
Glucose: 111 mg/dl (ref 70–140)
Potassium: 3.3 mEq/L — ABNORMAL LOW (ref 3.5–5.1)
Sodium: 135 mEq/L — ABNORMAL LOW (ref 136–145)
Total Bilirubin: 0.35 mg/dL (ref 0.20–1.20)
Total Protein: 6.6 g/dL (ref 6.4–8.3)

## 2014-09-17 LAB — PHOSPHORUS: Phosphorus: 2.6 mg/dL (ref 2.1–4.3)

## 2014-09-17 LAB — LACTATE DEHYDROGENASE (CC13): LDH: 275 U/L — ABNORMAL HIGH (ref 125–245)

## 2014-09-17 MED ORDER — TEMAZEPAM 30 MG PO CAPS: 30.0000 mg | ORAL_CAPSULE | Freq: Every evening | ORAL | Status: DC | PRN

## 2014-09-17 MED ORDER — HEPARIN SOD (PORK) LOCK FLUSH 100 UNIT/ML IV SOLN
500.0000 [IU] | Freq: Once | INTRAVENOUS | Status: AC
  Administered 2014-09-17: 500 [IU] via INTRAVENOUS
  Filled 2014-09-17: qty 5

## 2014-09-17 MED ORDER — SODIUM CHLORIDE 0.9 % IV SOLN
2.0000 g | Freq: Once | INTRAVENOUS | Status: AC
  Administered 2014-09-17: 2 g via INTRAVENOUS
  Filled 2014-09-17: qty 4

## 2014-09-17 MED ORDER — SODIUM CHLORIDE 0.9 % IJ SOLN
10.0000 mL | INTRAMUSCULAR | Status: DC | PRN
  Administered 2014-09-17: 10 mL via INTRAVENOUS
  Filled 2014-09-17: qty 10

## 2014-09-17 NOTE — Progress Notes (Addendum)
[facility] OFFICE PROGRESS NOTE   DIAGNOSIS: Stage IV (T2, N2, M1a) non-small cell lung cancer, squamous cell carcinoma presented with central left upper lobe lung mass with associated postobstructive atelectasis as well as ipsilateral and subcarinal lymphadenopathy as well as pleural tumor spread diagnosed in October 2015.  PRIOR THERAPY:  1) Systemic chemotherapy with carboplatin for AUC of 5 on day 1 and Abraxane 100 MG/M2 on days 1, 8 and 15 every 3 weeks. Status post 6 cycles.  2) Nivolumabper research protocol BMS 370 group B, arm A, given every 2 weeks. Status post 7 cycles. Last dose was given 08/13/2014 discontinued on 08/28/2014 secondary to disease progression.  CURRENT THERAPY: Systemic chemotherapy with docetaxel 75 MG/M2 and Cyramza 10 MG/KG every 3 weeks. First dose 09/10/2014.   INTERVAL HISTORY:   Ms. Sanjurjo returns as scheduled. She completed cycle 1 Taxotere/cyramza on 09/10/2014. She denies nausea/vomiting. No mouth sores. No change in baseline bowel habits of one to 2 loose stools a day. She takes Lomotil as needed. No rash. She has stable dyspnea on exertion. No cough or fever. Overall good appetite. She reports her weight is stable. She has stable neuropathy symptoms in the hands and feet. Main complaint is difficulty sleeping.  Objective:  Vital signs in last 24 hours:  Blood pressure 103/55, pulse 105, temperature 99.2 F (37.3 C), temperature source Oral, resp. rate 18, height '5\' 2"'$  (1.575 m), weight 156 lb 3.2 oz (70.852 kg), SpO2 99 %. repeat heart rate 88    HEENT: No thrush or ulcers. Resp: Faint bilateral inspiratory wheezes. Cardio: Regular rate and rhythm. GI: Abdomen soft and nontender. No hepatomegaly. Vascular: No leg edema. Calves soft and nontender. Skin: No rash. Port-A-Cath without erythema.    Lab Results:  Lab Results  Component Value Date   WBC 10.1 09/17/2014   HGB 11.9 09/17/2014   HCT 34.6* 09/17/2014   MCV 84.0  09/17/2014   PLT 229 09/17/2014   NEUTROABS 6.7* 09/17/2014    Imaging:  No results found.  Medications: I have reviewed the patient's current medications.  Assessment/Plan: 1. Stage IV non-small cell lung cancer, squamous cell carcinoma, initially treated with systemic chemotherapy with carboplatin and Abraxane status post 6 cycles. She was then treated with immunotherapy according to the BMS CheckMate 370 clinical trial with nivolumab. She completed 7 cycles. Restaging CT evaluation 08/28/2014 showed evidence of progression. She completed cycle 1 Taxotere/cyramza 09/10/2014. 2. Hypomagnesemia. She will receive IV magnesium today and tomorrow. 3. Hyperthyroidism. She continues methimazole. 4. Peripheral neuropathy. Stable. 5. Sleep disturbance. She will try Restoril 30 mg at bedtime as needed.   Disposition: Ms. Lapaglia appears stable. She has completed 1 cycle of Taxotere/cyramza. She overall tolerated cycle 1 well.  She will receive IV magnesium today and tomorrow.  For sleep a prescription was sent to her pharmacy for Restoril 30 mg at bedtime as needed.  She will return for a follow-up visit and cycle 2 Taxotere/cyramza on 10/01/2014. She will contact the office in the interim with any problems.  Patient seen with Dr. [name].    [name] ANP/GNP-BC   09/17/2014  3:27 PM  ADDENDUM: Hematology/Oncology Attending: I had a face to face encounter with the patient. I recommended her care plan. This is a very pleasant 69 years old white female with a stage IV non-small cell lung cancer status post systemic chemotherapy was carboplatin and Abraxane followed by treatment with immunotherapy with Nivolumab for 7 cycles discontinued secondary to disease progression. The patient  is currently on systemic chemotherapy with docetaxel and Cyramza status post 1 cycle and tolerated the first week of her treatment fairly well except for 2 episodes of diarrhea on daily basis that has been  going on for several weeks now even before starting the chemotherapy. I recommended for the patient to take 2 Imodium every morning as a prophylactic for her diarrhea. For the hypomagnesemia, we will arrange for the patient to receive IV magnesium sulfate today and tomorrow. The patient would come back for follow-up visit in 2 weeks for reevaluation before starting cycle #2. For insomnia, the patient will be started on Restoril 30 mg by mouth daily at bedtime. She was advised to call immediately if she has any concerning symptoms in the interval.  Disclaimer: This note was dictated with voice recognition software. Similar sounding words can inadvertently be transcribed and may not be corrected upon review. Eilleen Kempf., MD 09/17/2014

## 2014-09-17 NOTE — Progress Notes (Signed)
BMS [id number] - questionnaires (PROs) - patient into the cancer center for routine visit. Patient was given PROs upon arrival to the cancer center. The patient completed her PROs (EQ-5D-3L first and then FACT-L).  I checked the PROs for completeness.  Patient's research blood was drawn 09/01/2014.  The patient was thanked for her continued support of this clinical trial. Barb Kym Fenter 09/17/2014 3:15 PM

## 2014-09-17 NOTE — Patient Instructions (Signed)
Hypomagnesemia Magnesium is a common ion (mineral) in the body which is needed for metabolism. It is about how the body handles food and other chemical reactions necessary for life. Only about 2% of the magnesium in our body is found in the blood. When this is low, it is called hypomagnesemia. The blood will measure only a tiny amount of the magnesium in our body. When it is low in our blood, it does not mean that the whole body supply is low. The normal serum concentration ranges from 1.8-2.5 mEq/L. When the level gets to be less than 1.0 mEq/L, a number of problems begin to happen.  CAUSES   Receiving intravenous fluids without magnesium replacement.  Loss of magnesium from the bowel by nasogastric suction.  Loss of magnesium from nausea and vomiting or severe diarrhea. Any of the inflammatory bowel conditions can cause this.  Abuse of alcohol often leads to low serum magnesium.  An inherited form of magnesium loss happens when the kidneys lose magnesium. This is called familial or primary hypomagnesemia.  Some medications such as diuretics also cause the loss of magnesium. SYMPTOMS  These following problems are worse if the changes in magnesium levels come on suddenly.  Tremor.  Confusion.  Muscle weakness.  Oversensitive to sights and sounds.  Sensitive reflexes.  Depression.  Muscular fibrillations.  Overreactivity of the nerves.  Irritability.  Psychosis.  Spasms of the hand muscles.  Tetany (where the muscles go into uncontrollable spasms). DIAGNOSIS  This condition can be diagnosed by blood tests. TREATMENT   In an emergency, magnesium can be given intravenously (by vein).  If the condition is less worrisome, it can be corrected by diet. High levels of magnesium are found in green leafy vegetables, peas, beans, and nuts among other things. It can also be given through medications by mouth.  If it is being caused by medications, changes can be made.  If  alcohol is a problem, help is available if there are difficulties giving it up. Document Released: 10/20/2004 Document Revised: 06/10/2013 Document Reviewed: 09/14/2007 [facility] Patient Information 2015 [place], [place]. This information is not intended to replace advice given to you by your health care provider. Make sure you discuss any questions you have with your health care provider.

## 2014-09-17 NOTE — Progress Notes (Signed)
Quick Note:  Call patient with the result and arrange for her to receive magnesium sulfate 2 g iv today and tomorrow. ______

## 2014-09-17 NOTE — Progress Notes (Signed)
Informed patient her potassium was low. Given list of potassium rich foods. Patient verbalized understanding.

## 2014-09-17 NOTE — Telephone Encounter (Signed)
Called in prescription for Temazepam (Restoril) 30 mg capsule to take at bedtime to [facility]. Call completed.

## 2014-09-17 NOTE — Progress Notes (Signed)
BMS 370: Follow-up visit 1 to EOT of study on Nivolumab. I met with patient and significant other, Tammie Clay, in lobby prior to patient's shared appointment with NP, [name] and Dr. [name], for Follow-up visit 1. Patient completed PRO's with research assistant, [name], and study required labs drawn today as well. Resting vitals completed, O2 saturation @ 99%, HR @ 105 with retake of HR @ 88, weight @ 70.85 kg and maintaining. Patient denies any new symptoms and with main complaint today to be difficulty staying asleep, which has increased her level of fatigue during the day. Prescription for Restoril, 30 mg tablet with instructions to take one tablet at bedtime, given to patient by Dr. [name], as well as instructions to stop taking valium at bedtime, patient gave verbal understanding to these instructions. Patient continues to report SOB with exertion only, denies cough, denies nausea or vomiting, denies any skin concerns, no mouth sores, denies pain, no edema present. Patient does report to have daily diarrhea with no more than 2 episodes per day and states "90% of the time, it's just once a day, usually in the morning." Patient also continues to report non-worsening neuropathy to fingers on bilateral hands and bilateral feet. Magnesium resulted at 1.3 and per Dr. [name], patient to receive 2 mg IV mag this afternoon and tomorrow. I spoke with patient today on the EOT and follow-up visits again, I informed her that after today's visit,she will have follow-up visit #2 toward the end of October early November for study drug Nivolumab assessment. Patient gave verbal understanding. All patient's questions answered to her satisfaction. I thanked patient for her time and continued support of study. I also encouraged her to call Dr. [name] or myself with any questions or concerns she may have.  Patient's potassium @ 3.3, Per NP, patient to increase potassium rich foods, patient in treatment room, Randolm Idol RN informed and will inform patient to add potassium rich foods to diet.  [name], RN, BSN Clinical Research 09/17/2014 4:44 PM

## 2014-09-18 ENCOUNTER — Ambulatory Visit (HOSPITAL_BASED_OUTPATIENT_CLINIC_OR_DEPARTMENT_OTHER): Payer: Medicare Other

## 2014-09-18 ENCOUNTER — Ambulatory Visit: Payer: Medicare Other

## 2014-09-18 DIAGNOSIS — R197 Diarrhea, unspecified: Secondary | ICD-10-CM

## 2014-09-18 DIAGNOSIS — C3492 Malignant neoplasm of unspecified part of left bronchus or lung: Secondary | ICD-10-CM

## 2014-09-18 MED ORDER — SODIUM CHLORIDE 0.9 % IV SOLN
2.0000 g | Freq: Once | INTRAVENOUS | Status: AC
  Administered 2014-09-18: 2 g via INTRAVENOUS
  Filled 2014-09-18: qty 4

## 2014-09-18 MED ORDER — SODIUM CHLORIDE 0.9 % IV SOLN
INTRAVENOUS | Status: DC
  Administered 2014-09-18: 15:00:00 via INTRAVENOUS

## 2014-09-18 NOTE — Patient Instructions (Signed)
Hypomagnesemia Magnesium is a common ion (mineral) in the body which is needed for metabolism. It is about how the body handles food and other chemical reactions necessary for life. Only about 2% of the magnesium in our body is found in the blood. When this is low, it is called hypomagnesemia. The blood will measure only a tiny amount of the magnesium in our body. When it is low in our blood, it does not mean that the whole body supply is low. The normal serum concentration ranges from 1.8-2.5 mEq/L. When the level gets to be less than 1.0 mEq/L, a number of problems begin to happen.  CAUSES   Receiving intravenous fluids without magnesium replacement.  Loss of magnesium from the bowel by nasogastric suction.  Loss of magnesium from nausea and vomiting or severe diarrhea. Any of the inflammatory bowel conditions can cause this.  Abuse of alcohol often leads to low serum magnesium.  An inherited form of magnesium loss happens when the kidneys lose magnesium. This is called familial or primary hypomagnesemia.  Some medications such as diuretics also cause the loss of magnesium. SYMPTOMS  These following problems are worse if the changes in magnesium levels come on suddenly.  Tremor.  Confusion.  Muscle weakness.  Oversensitive to sights and sounds.  Sensitive reflexes.  Depression.  Muscular fibrillations.  Overreactivity of the nerves.  Irritability.  Psychosis.  Spasms of the hand muscles.  Tetany (where the muscles go into uncontrollable spasms). DIAGNOSIS  This condition can be diagnosed by blood tests. TREATMENT   In an emergency, magnesium can be given intravenously (by vein).  If the condition is less worrisome, it can be corrected by diet. High levels of magnesium are found in green leafy vegetables, peas, beans, and nuts among other things. It can also be given through medications by mouth.  If it is being caused by medications, changes can be made.  If  alcohol is a problem, help is available if there are difficulties giving it up. Document Released: 10/20/2004 Document Revised: 06/10/2013 Document Reviewed: 09/14/2007 [facility] Patient Information 2015 [place], [place]. This information is not intended to replace advice given to you by your health care provider. Make sure you discuss any questions you have with your health care provider.

## 2014-09-24 ENCOUNTER — Other Ambulatory Visit (HOSPITAL_BASED_OUTPATIENT_CLINIC_OR_DEPARTMENT_OTHER): Payer: Medicare Other

## 2014-09-24 DIAGNOSIS — C3412 Malignant neoplasm of upper lobe, left bronchus or lung: Secondary | ICD-10-CM | POA: Diagnosis not present

## 2014-09-24 DIAGNOSIS — C3492 Malignant neoplasm of unspecified part of left bronchus or lung: Secondary | ICD-10-CM

## 2014-09-24 LAB — COMPREHENSIVE METABOLIC PANEL (CC13)
ALK PHOS: 111 U/L (ref 40–150)
ALT: 32 U/L (ref 0–55)
AST: 26 U/L (ref 5–34)
Albumin: 3.6 g/dL (ref 3.5–5.0)
Anion Gap: 8 mEq/L (ref 3–11)
BUN: 8.8 mg/dL (ref 7.0–26.0)
CALCIUM: 9.1 mg/dL (ref 8.4–10.4)
CO2: 30 mEq/L — ABNORMAL HIGH (ref 22–29)
Chloride: 103 mEq/L (ref 98–109)
Creatinine: 1 mg/dL (ref 0.6–1.1)
EGFR: 55 mL/min/{1.73_m2} — AB (ref >90)
Glucose: 105 mg/dl (ref 70–140)
POTASSIUM: 4.1 meq/L (ref 3.5–5.1)
Sodium: 141 mEq/L (ref 136–145)
Total Bilirubin: 0.22 mg/dL (ref 0.20–1.20)
Total Protein: 6.6 g/dL (ref 6.4–8.3)

## 2014-09-24 LAB — CBC WITH DIFFERENTIAL/PLATELET
BASO%: 0.6 % (ref 0.0–2.0)
Basophils Absolute: 0.1 10*3/uL (ref 0.0–0.1)
EOS ABS: 0 10*3/uL (ref 0.0–0.5)
EOS%: 0.1 % (ref 0.0–7.0)
HEMATOCRIT: 35.4 % (ref 34.8–46.6)
HEMOGLOBIN: 11.3 g/dL — AB (ref 11.6–15.9)
LYMPH#: 1.4 10*3/uL (ref 0.9–3.3)
LYMPH%: 11.2 % — ABNORMAL LOW (ref 14.0–49.7)
MCH: 28.1 pg (ref 25.1–34.0)
MCHC: 32.1 g/dL (ref 31.5–36.0)
MCV: 87.6 fL (ref 79.5–101.0)
MONO#: 0.6 10*3/uL (ref 0.1–0.9)
MONO%: 4.5 % (ref 0.0–14.0)
NEUT%: 83.6 % — ABNORMAL HIGH (ref 38.4–76.8)
NEUTROS ABS: 10.6 10*3/uL — AB (ref 1.5–6.5)
Platelets: 210 10*3/uL (ref 145–400)
RBC: 4.04 10*6/uL (ref 3.70–5.45)
RDW: 15.9 % — AB (ref 11.2–14.5)
WBC: 12.7 10*3/uL — AB (ref 3.9–10.3)

## 2014-09-29 ENCOUNTER — Other Ambulatory Visit: Payer: Self-pay | Admitting: Internal Medicine

## 2014-10-01 ENCOUNTER — Other Ambulatory Visit: Payer: Medicare Other

## 2014-10-01 ENCOUNTER — Ambulatory Visit (HOSPITAL_COMMUNITY)
Admission: RE | Admit: 2014-10-01 | Discharge: 2014-10-01 | Disposition: A | Discharge status: Home / Self Care | Payer: Medicare Other | Source: Ambulatory Visit | Attending: Physician Assistant | Admitting: Physician Assistant

## 2014-10-01 ENCOUNTER — Telehealth: Payer: Self-pay | Admitting: Internal Medicine

## 2014-10-01 ENCOUNTER — Ambulatory Visit (HOSPITAL_BASED_OUTPATIENT_CLINIC_OR_DEPARTMENT_OTHER): Payer: Medicare Other | Admitting: Physician Assistant

## 2014-10-01 ENCOUNTER — Ambulatory Visit (HOSPITAL_BASED_OUTPATIENT_CLINIC_OR_DEPARTMENT_OTHER): Payer: Medicare Other

## 2014-10-01 ENCOUNTER — Other Ambulatory Visit (HOSPITAL_BASED_OUTPATIENT_CLINIC_OR_DEPARTMENT_OTHER): Payer: Medicare Other

## 2014-10-01 VITALS — BP 129/87 | HR 91 | Temp 98.2°F | Resp 18 | Ht 62.0 in | Wt 157.7 lb

## 2014-10-01 DIAGNOSIS — C3492 Malignant neoplasm of unspecified part of left bronchus or lung: Secondary | ICD-10-CM

## 2014-10-01 DIAGNOSIS — R0789 Other chest pain: Secondary | ICD-10-CM

## 2014-10-01 DIAGNOSIS — R079 Chest pain, unspecified: Secondary | ICD-10-CM | POA: Diagnosis not present

## 2014-10-01 DIAGNOSIS — C3490 Malignant neoplasm of unspecified part of unspecified bronchus or lung: Secondary | ICD-10-CM | POA: Insufficient documentation

## 2014-10-01 DIAGNOSIS — C3412 Malignant neoplasm of upper lobe, left bronchus or lung: Secondary | ICD-10-CM

## 2014-10-01 DIAGNOSIS — E0590 Thyrotoxicosis, unspecified without thyrotoxic crisis or storm: Secondary | ICD-10-CM | POA: Diagnosis not present

## 2014-10-01 DIAGNOSIS — Z5111 Encounter for antineoplastic chemotherapy: Secondary | ICD-10-CM

## 2014-10-01 DIAGNOSIS — Z5112 Encounter for antineoplastic immunotherapy: Secondary | ICD-10-CM

## 2014-10-01 LAB — COMPREHENSIVE METABOLIC PANEL (CC13)
ALT: 24 U/L (ref 0–55)
AST: 16 U/L (ref 5–34)
Albumin: 3.7 g/dL (ref 3.5–5.0)
Alkaline Phosphatase: 91 U/L (ref 40–150)
Anion Gap: 13 mEq/L — ABNORMAL HIGH (ref 3–11)
BILIRUBIN TOTAL: 0.24 mg/dL (ref 0.20–1.20)
BUN: 17.8 mg/dL (ref 7.0–26.0)
CHLORIDE: 97 meq/L — AB (ref 98–109)
CO2: 27 meq/L (ref 22–29)
CREATININE: 1 mg/dL (ref 0.6–1.1)
Calcium: 9.4 mg/dL (ref 8.4–10.4)
EGFR: 54 mL/min/{1.73_m2} — ABNORMAL LOW (ref >90)
GLUCOSE: 162 mg/dL — AB (ref 70–140)
Potassium: 4.5 mEq/L (ref 3.5–5.1)
SODIUM: 137 meq/L (ref 136–145)
TOTAL PROTEIN: 7 g/dL (ref 6.4–8.3)

## 2014-10-01 LAB — CBC WITH DIFFERENTIAL/PLATELET
BASO%: 0.3 % (ref 0.0–2.0)
Basophils Absolute: 0 10*3/uL (ref 0.0–0.1)
EOS%: 0 % (ref 0.0–7.0)
Eosinophils Absolute: 0 10*3/uL (ref 0.0–0.5)
HCT: 34.9 % (ref 34.8–46.6)
HGB: 11.1 g/dL — ABNORMAL LOW (ref 11.6–15.9)
LYMPH%: 8.3 % — AB (ref 14.0–49.7)
MCH: 28.3 pg (ref 25.1–34.0)
MCHC: 31.9 g/dL (ref 31.5–36.0)
MCV: 88.7 fL (ref 79.5–101.0)
MONO#: 0.3 10*3/uL (ref 0.1–0.9)
MONO%: 2.7 % (ref 0.0–14.0)
NEUT#: 9.8 10*3/uL — ABNORMAL HIGH (ref 1.5–6.5)
NEUT%: 88.7 % — AB (ref 38.4–76.8)
Platelets: 359 10*3/uL (ref 145–400)
RBC: 3.94 10*6/uL (ref 3.70–5.45)
RDW: 17.7 % — ABNORMAL HIGH (ref 11.2–14.5)
WBC: 11 10*3/uL — ABNORMAL HIGH (ref 3.9–10.3)
lymph#: 0.9 10*3/uL (ref 0.9–3.3)

## 2014-10-01 LAB — MAGNESIUM (CC13): Magnesium: 1.6 mg/dl (ref 1.5–2.5)

## 2014-10-01 MED ORDER — ACETAMINOPHEN 325 MG PO TABS
650.0000 mg | ORAL_TABLET | Freq: Once | ORAL | Status: AC
  Administered 2014-10-01: 650 mg via ORAL

## 2014-10-01 MED ORDER — HEPARIN SOD (PORK) LOCK FLUSH 100 UNIT/ML IV SOLN
500.0000 [IU] | Freq: Once | INTRAVENOUS | Status: AC | PRN
  Administered 2014-10-01: 500 [IU]
  Filled 2014-10-01: qty 5

## 2014-10-01 MED ORDER — SODIUM CHLORIDE 0.9 % IV SOLN
Freq: Once | INTRAVENOUS | Status: AC
  Administered 2014-10-01: 16:00:00 via INTRAVENOUS
  Filled 2014-10-01: qty 4

## 2014-10-01 MED ORDER — SODIUM CHLORIDE 0.9 % IJ SOLN
10.0000 mL | INTRAMUSCULAR | Status: DC | PRN
  Administered 2014-10-01: 10 mL
  Filled 2014-10-01: qty 10

## 2014-10-01 MED ORDER — DIPHENHYDRAMINE HCL 25 MG PO CAPS
ORAL_CAPSULE | ORAL | Status: AC
  Filled 2014-10-01: qty 2

## 2014-10-01 MED ORDER — SODIUM CHLORIDE 0.9 % IV SOLN
10.0000 mg/kg | Freq: Once | INTRAVENOUS | Status: AC
  Administered 2014-10-01: 700 mg via INTRAVENOUS
  Filled 2014-10-01: qty 70

## 2014-10-01 MED ORDER — ACETAMINOPHEN 325 MG PO TABS
ORAL_TABLET | ORAL | Status: AC
  Filled 2014-10-01: qty 2

## 2014-10-01 MED ORDER — DIPHENHYDRAMINE HCL 50 MG/ML IJ SOLN
50.0000 mg | Freq: Once | INTRAMUSCULAR | Status: AC
  Administered 2014-10-01: 50 mg via INTRAVENOUS

## 2014-10-01 MED ORDER — DOCETAXEL CHEMO INJECTION 160 MG/16ML
75.0000 mg/m2 | Freq: Once | INTRAVENOUS | Status: AC
  Administered 2014-10-01: 130 mg via INTRAVENOUS
  Filled 2014-10-01: qty 13

## 2014-10-01 MED ORDER — SODIUM CHLORIDE 0.9 % IV SOLN
Freq: Once | INTRAVENOUS | Status: AC
  Administered 2014-10-01: 14:00:00 via INTRAVENOUS

## 2014-10-01 NOTE — Patient Instructions (Signed)
[place] [facility] Discharge Instructions for Patients Receiving Chemotherapy  Today you received the following chemotherapy agents cyprexa, taxotere  To help prevent nausea and vomiting after your treatment, we encourage you to take your nausea medication   If you develop nausea and vomiting that is not controlled by your nausea medication, call the clinic.   BELOW ARE SYMPTOMS THAT SHOULD BE REPORTED IMMEDIATELY:  *FEVER GREATER THAN 100.5 F  *CHILLS WITH OR WITHOUT FEVER  NAUSEA AND VOMITING THAT IS NOT CONTROLLED WITH YOUR NAUSEA MEDICATION  *UNUSUAL SHORTNESS OF BREATH  *UNUSUAL BRUISING OR BLEEDING  TENDERNESS IN MOUTH AND THROAT WITH OR WITHOUT PRESENCE OF ULCERS  *URINARY PROBLEMS  *BOWEL PROBLEMS  UNUSUAL RASH Items with * indicate a potential emergency and should be followed up as soon as possible.  Feel free to call the clinic you have any questions or concerns. The clinic phone number is [phone].  Please show the [facility] at check-in to the Emergency Department and triage nurse.

## 2014-10-01 NOTE — Telephone Encounter (Signed)
Gave and printed appt sched and avs for pt for Aug thru OCT

## 2014-10-02 ENCOUNTER — Other Ambulatory Visit: Payer: Self-pay | Admitting: Internal Medicine

## 2014-10-03 ENCOUNTER — Ambulatory Visit (HOSPITAL_BASED_OUTPATIENT_CLINIC_OR_DEPARTMENT_OTHER): Payer: Medicare Other

## 2014-10-03 ENCOUNTER — Telehealth: Payer: Self-pay | Admitting: *Deleted

## 2014-10-03 VITALS — BP 146/70 | HR 75 | Temp 98.3°F

## 2014-10-03 DIAGNOSIS — C3492 Malignant neoplasm of unspecified part of left bronchus or lung: Secondary | ICD-10-CM

## 2014-10-03 DIAGNOSIS — C3412 Malignant neoplasm of upper lobe, left bronchus or lung: Secondary | ICD-10-CM | POA: Diagnosis not present

## 2014-10-03 DIAGNOSIS — Z5189 Encounter for other specified aftercare: Secondary | ICD-10-CM | POA: Diagnosis not present

## 2014-10-03 MED ORDER — PEGFILGRASTIM INJECTION 6 MG/0.6ML ~~LOC~~
6.0000 mg | PREFILLED_SYRINGE | Freq: Once | SUBCUTANEOUS | Status: AC
  Administered 2014-10-03: 6 mg via SUBCUTANEOUS
  Filled 2014-10-03: qty 0.6

## 2014-10-03 NOTE — Telephone Encounter (Signed)
PER Tammie JOHNSON,PA- PT.'S CHEST XRAY SHOWED NO EVIDENCE OF ACUTE DISEASE. NOTIFIED PT. SHE VOICES UNDERSTANDING.

## 2014-10-03 NOTE — Patient Instructions (Signed)
Pegfilgrastim injection What is this medicine? PEGFILGRASTIM (peg fil GRA stim) is a long-acting granulocyte colony-stimulating factor that stimulates the growth of neutrophils, a type of white blood cell important in the body's fight against infection. It is used to reduce the incidence of fever and infection in patients with certain types of cancer who are receiving chemotherapy that affects the bone marrow. This medicine may be used for other purposes; ask your health care provider or pharmacist if you have questions. COMMON BRAND NAME(S): Neulasta What should I tell my health care provider before I take this medicine? They need to know if you have any of these conditions: -latex allergy -ongoing radiation therapy -sickle cell disease -skin reactions to acrylic adhesives (On-Body Injector only) -an unusual or allergic reaction to pegfilgrastim, filgrastim, other medicines, foods, dyes, or preservatives -pregnant or trying to get pregnant -breast-feeding How should I use this medicine? This medicine is for injection under the skin. If you get this medicine at home, you will be taught how to prepare and give the pre-filled syringe or how to use the On-body Injector. Refer to the patient Instructions for Use for detailed instructions. Use exactly as directed. Take your medicine at regular intervals. Do not take your medicine more often than directed. It is important that you put your used needles and syringes in a special sharps container. Do not put them in a trash can. If you do not have a sharps container, call your pharmacist or healthcare provider to get one. Talk to your pediatrician regarding the use of this medicine in children. Special care may be needed. Overdosage: If you think you have taken too much of this medicine contact a poison control center or emergency room at once. NOTE: This medicine is only for you. Do not share this medicine with others. What if I miss a dose? It is  important not to miss your dose. Call your doctor or health care professional if you miss your dose. If you miss a dose due to an On-body Injector failure or leakage, a new dose should be administered as soon as possible using a single prefilled syringe for manual use. What may interact with this medicine? Interactions have not been studied. Give your health care provider a list of all the medicines, herbs, non-prescription drugs, or dietary supplements you use. Also tell them if you smoke, drink alcohol, or use illegal drugs. Some items may interact with your medicine. This list may not describe all possible interactions. Give your health care provider a list of all the medicines, herbs, non-prescription drugs, or dietary supplements you use. Also tell them if you smoke, drink alcohol, or use illegal drugs. Some items may interact with your medicine. What should I watch for while using this medicine? You may need blood work done while you are taking this medicine. If you are going to need a MRI, CT scan, or other procedure, tell your doctor that you are using this medicine (On-Body Injector only). What side effects may I notice from receiving this medicine? Side effects that you should report to your doctor or health care professional as soon as possible: -allergic reactions like skin rash, itching or hives, swelling of the face, lips, or tongue -dizziness -fever -pain, redness, or irritation at site where injected -pinpoint red spots on the skin -shortness of breath or breathing problems -stomach or side pain, or pain at the shoulder -swelling -tiredness -trouble passing urine Side effects that usually do not require medical attention (report to your doctor   or health care professional if they continue or are bothersome): -bone pain -muscle pain This list may not describe all possible side effects. Call your doctor for medical advice about side effects. You may report side effects to FDA at  1-800-FDA-1088. Where should I keep my medicine? Keep out of the reach of children. Store pre-filled syringes in a refrigerator between 2 and 8 degrees C (36 and 46 degrees F). Do not freeze. Keep in carton to protect from light. Throw away this medicine if it is left out of the refrigerator for more than 48 hours. Throw away any unused medicine after the expiration date. NOTE: This sheet is a summary. It may not cover all possible information. If you have questions about this medicine, talk to your doctor, pharmacist, or health care provider.  2015, Elsevier/Gold Standard. (2013-04-25 16:14:05)  

## 2014-10-06 ENCOUNTER — Telehealth: Payer: Self-pay | Admitting: Medical Oncology

## 2014-10-06 NOTE — Telephone Encounter (Signed)
Lomotil called in,.

## 2014-10-08 ENCOUNTER — Other Ambulatory Visit (HOSPITAL_BASED_OUTPATIENT_CLINIC_OR_DEPARTMENT_OTHER): Payer: Medicare Other

## 2014-10-08 DIAGNOSIS — C3492 Malignant neoplasm of unspecified part of left bronchus or lung: Secondary | ICD-10-CM

## 2014-10-08 DIAGNOSIS — C3412 Malignant neoplasm of upper lobe, left bronchus or lung: Secondary | ICD-10-CM

## 2014-10-08 LAB — CBC WITH DIFFERENTIAL/PLATELET
BASO%: 0.9 % (ref 0.0–2.0)
Basophils Absolute: 0.1 10*3/uL (ref 0.0–0.1)
EOS ABS: 0 10*3/uL (ref 0.0–0.5)
EOS%: 0.4 % (ref 0.0–7.0)
HCT: 34.8 % (ref 34.8–46.6)
HEMOGLOBIN: 11.5 g/dL — AB (ref 11.6–15.9)
LYMPH%: 18.6 % (ref 14.0–49.7)
MCH: 28.6 pg (ref 25.1–34.0)
MCHC: 32.9 g/dL (ref 31.5–36.0)
MCV: 87 fL (ref 79.5–101.0)
MONO#: 1 10*3/uL — AB (ref 0.1–0.9)
MONO%: 12.2 % (ref 0.0–14.0)
NEUT%: 67.9 % (ref 38.4–76.8)
NEUTROS ABS: 5.4 10*3/uL (ref 1.5–6.5)
PLATELETS: 232 10*3/uL (ref 145–400)
RBC: 4 10*6/uL (ref 3.70–5.45)
RDW: 18 % — ABNORMAL HIGH (ref 11.2–14.5)
WBC: 8 10*3/uL (ref 3.9–10.3)
lymph#: 1.5 10*3/uL (ref 0.9–3.3)

## 2014-10-08 LAB — COMPREHENSIVE METABOLIC PANEL (CC13)
ALBUMIN: 3.5 g/dL (ref 3.5–5.0)
ALK PHOS: 97 U/L (ref 40–150)
ALT: 18 U/L (ref 0–55)
AST: 19 U/L (ref 5–34)
Anion Gap: 11 mEq/L (ref 3–11)
BILIRUBIN TOTAL: 0.41 mg/dL (ref 0.20–1.20)
BUN: 16.2 mg/dL (ref 7.0–26.0)
CO2: 29 meq/L (ref 22–29)
Calcium: 9.4 mg/dL (ref 8.4–10.4)
Chloride: 98 mEq/L (ref 98–109)
Creatinine: 1 mg/dL (ref 0.6–1.1)
EGFR: 55 mL/min/{1.73_m2} — AB (ref >90)
GLUCOSE: 116 mg/dL (ref 70–140)
POTASSIUM: 3.6 meq/L (ref 3.5–5.1)
SODIUM: 137 meq/L (ref 136–145)
TOTAL PROTEIN: 6.6 g/dL (ref 6.4–8.3)

## 2014-10-08 NOTE — Patient Instructions (Signed)
Continue weekly labs Follow up in 3 weeks, prior to your next scheduled cycle of chemotherapy

## 2014-10-08 NOTE — Progress Notes (Addendum)
[facility] [phone]   Fax:([phone]) [address] [employer] [address] [place] [address]  DIAGNOSIS: Stage IV (T2, N2, M1a) non-small cell lung cancer, squamous cell carcinoma presented with central left upper lobe lung mass with associated postobstructive atelectasis as well as ipsilateral and subcarinal lymphadenopathy as well as pleural tumor spread diagnosed in October 2015.  PRIOR THERAPY:  1) Systemic chemotherapy with carboplatin for AUC of 5 on day 1 and Abraxane 100 MG/M2 on days 1, 8 and 15 every 3 weeks. Status post 6 cycles.  2) Nivolumab per research protocol BMS 370 group B, arm A, given every 2 weeks. Status post 7 cycles. Last dose was given 08/13/2014 discontinued on 08/28/2014 secondary to disease progression.  CURRENT THERAPY: Systemic chemotherapy with docetaxel 75 MG/M2 and Cyramza 10 MG/KG every 3 weeks. First dose 09/10/2014.  INTERVAL HISTORY: Lidia A Hayworth 69 y.o. female returns to the clinic today for follow-up visit accompanied by her significant other.  The patient completed 7 cycles of treatment with immunotherapy with Nivolumab and this was discontinued recently secondary to disease progression. She is currently being treated with systemic chemotherapy with docetaxel and Cyramza, status post one cycle. She tolerated her first cycle relatively well. She denied having any significant fever or chills. The patient denied having any significant chest pain, shortness of breath, cough or hemoptysis. She has no nausea or vomiting.  MEDICAL HISTORY: Past Medical History  Diagnosis Date  . Hypertension   . COPD (chronic obstructive pulmonary disease)   . Hyperlipidemia   . GERD (gastroesophageal reflux disease)   . Osteopenia   . Insomnia   . Mediastinal lymphadenopathy 11/19/13    PER CT  . Multiple lung nodules on CT 11/19/13    LEFT LUNG BASE  . Lung mass 11/19/13    RUL PER  CT  . Coronary artery calcification seen on CAT scan 11/21/2013  . COPD (chronic obstructive pulmonary disease) 11/21/2013  . Pneumonia     hx of  . Anxiety   . stage iv sq cell lung ca dx'd 11/2013    Recist     ALLERGIES:  has No Known Allergies.  MEDICATIONS:  Current Outpatient Prescriptions  Medication Sig Dispense Refill  . acetaminophen (TYLENOL) 325 MG tablet Take 650 mg by mouth daily as needed for moderate pain or headache. Take 2 tablets every morning    . albuterol (PROVENTIL HFA;VENTOLIN HFA) 108 (90 BASE) MCG/ACT inhaler Inhale 2 puffs into the lungs every 6 (six) hours as needed for wheezing.    . calcium carbonate (TUMS - DOSED IN MG ELEMENTAL CALCIUM) 500 MG chewable tablet Chew 1 tablet by mouth daily as needed for indigestion or heartburn.     . dexamethasone (DECADRON) 4 MG tablet 2 tablets by mouth twice a day, the day before, day of and day after the chemotherapy every 3 weeks 40 tablet 1  . diazepam (VALIUM) 5 MG tablet Take 5 mg by mouth at bedtime.    . diphenoxylate-atropine (LOMOTIL) 2.5-0.025 MG per tablet TAKE 2 TABLETS 4 TIMES A DAY AS NEEDED FOR DIARRHEA 30 tablet 0  . docusate sodium (COLACE) 100 MG capsule Take 100 mg by mouth 2 (two) times daily as needed for moderate constipation.     . DULoxetine (CYMBALTA) 30 MG capsule TAKE ONE CAPSULE BY MOUTH EVERY DAY 30 capsule 2  . HYDROcodone-acetaminophen (NORCO) 10-325 MG per tablet Take 1 tablet by mouth  every 6 (six) hours as needed for moderate pain or severe pain.     Marland Kitchen lidocaine-prilocaine (EMLA) cream Apply 1 application topically as needed. 30 g 0  . lisinopril-hydrochlorothiazide (PRINZIDE,ZESTORETIC) 20-12.5 MG per tablet Take 2 tablets by mouth every morning.     . loperamide (IMODIUM) 2 MG capsule Take by mouth as needed for diarrhea or loose stools.    Marland Kitchen loratadine (CLARITIN) 10 MG tablet Take 10 mg by mouth daily.    . magnesium oxide (MAG-OX) 400 (241.3 MG) MG tablet Take 1 tablet (400 mg total)  by mouth 2 (two) times daily. (Patient not taking: Reported on 10/01/2014) 60 tablet 0  . methimazole (TAPAZOLE) 10 MG tablet Take 10 mg by mouth 2 (two) times daily.    . metoprolol succinate (TOPROL-XL) 50 MG 24 hr tablet Take 50 mg by mouth every morning. Take with or immediately following a meal.    . Multiple Vitamin (MULTIVITAMIN WITH MINERALS) TABS Take 1 tablet by mouth daily.    . polyethylene glycol (MIRALAX / GLYCOLAX) packet Take 17 g by mouth daily as needed for moderate constipation or severe constipation.     . prochlorperazine (COMPAZINE) 10 MG tablet Take 1 tablet (10 mg total) by mouth every 6 (six) hours as needed for nausea or vomiting. 30 tablet 1  . ranitidine (ZANTAC) 150 MG tablet Take 150 mg by mouth 2 (two) times daily as needed for heartburn.     . SYMBICORT 80-4.5 MCG/ACT inhaler Inhale 2 puffs into the lungs 2 (two) times daily.   3  . temazepam (RESTORIL) 30 MG capsule Take 1 capsule (30 mg total) by mouth at bedtime as needed for sleep. 30 capsule 0   No current facility-administered medications for this visit.    SURGICAL HISTORY:  Past Surgical History  Procedure Laterality Date  .  c section x 2    . Urethral cyst removed    . Tonsillectomy    . Appendectomy    . Colonoscopy with propofol N/A 07/17/2012    Procedure: COLONOSCOPY WITH PROPOFOL;  Surgeon: Garlan Fair, MD;  Location: [facility];  Service: Endoscopy;  Laterality: N/A;  . Esophagogastroduodenoscopy (egd) with propofol N/A 07/17/2012    Procedure: ESOPHAGOGASTRODUODENOSCOPY (EGD) WITH PROPOFOL;  Surgeon: Garlan Fair, MD;  Location: [facility];  Service: Endoscopy;  Laterality: N/A;  . Video bronchoscopy with endobronchial ultrasound N/A 12/03/2013    Procedure: VIDEO BRONCHOSCOPY WITH ENDOBRONCHIAL ULTRASOUND WITH BIOPSIES;  Surgeon: Grace Isaac, MD;  Location: [facility];  Service: Thoracic;  Laterality: N/A;  . Portacath placement Left 12/10/2013    Procedure: INSERTION PORT-A-CATH;   Surgeon: Grace Isaac, MD;  Location: [facility];  Service: Thoracic;  Laterality: Left;    REVIEW OF SYSTEMS:  Constitutional: negative Eyes: negative Ears, nose, mouth, throat, and face: negative Respiratory: negative Cardiovascular: negative Gastrointestinal: negative Genitourinary:negative Integument/breast: negative Hematologic/lymphatic: negative Musculoskeletal:negative Neurological: positive for paresthesia Behavioral/Psych: positive for anxiety Endocrine: negative Allergic/Immunologic: negative   PHYSICAL EXAMINATION: General appearance: alert, cooperative, fatigued and no distress Head: Normocephalic, without obvious abnormality, atraumatic Neck: no adenopathy, no JVD, supple, symmetrical, trachea midline and thyroid not enlarged, symmetric, no tenderness/mass/nodules Lymph nodes: Cervical, supraclavicular, and axillary nodes normal. Resp: clear to auscultation bilaterally Back: symmetric, no curvature. ROM normal. No CVA tenderness. Cardio: regular rate and rhythm, S1, S2 normal, no murmur, click, rub or gallop GI: soft, non-tender; bowel sounds normal; no masses,  no organomegaly Genitalia: defer exam Extremities: extremities normal, atraumatic, no cyanosis or  edema Neurologic: Alert and oriented X 3, normal strength and tone. Normal symmetric reflexes. Normal coordination and gait  ECOG PERFORMANCE STATUS: 1 - Symptomatic but completely ambulatory  Blood pressure 129/87, pulse 91, temperature 98.2 F (36.8 C), temperature source Oral, resp. rate 18, height '5\' 2"'$  (1.575 m), weight 157 lb 11.2 oz (71.532 kg), SpO2 96 %.  LABORATORY DATA: Lab Results  Component Value Date   WBC 8.0 10/08/2014   HGB 11.5* 10/08/2014   HCT 34.8 10/08/2014   MCV 87.0 10/08/2014   PLT 232 10/08/2014      Chemistry      Component Value Date/Time   NA 137 10/08/2014 1040   NA 135 07/19/2014 1607   K 3.6 10/08/2014 1040   K 3.4* 07/19/2014 1607   CL 99* 07/19/2014 1607   CO2  29 10/08/2014 1040   CO2 28 07/19/2014 1607   BUN 16.2 10/08/2014 1040   BUN 21* 07/19/2014 1607   CREATININE 1.0 10/08/2014 1040   CREATININE 1.01* 07/19/2014 1607   CREATININE 0.80 11/22/2013 1054      Component Value Date/Time   CALCIUM 9.4 10/08/2014 1040   CALCIUM 8.7* 07/19/2014 1607   ALKPHOS 97 10/08/2014 1040   ALKPHOS 77 12/10/2013 0637   AST 19 10/08/2014 1040   AST 16 12/10/2013 0637   ALT 18 10/08/2014 1040   ALT 16 12/10/2013 0637   BILITOT 0.41 10/08/2014 1040   BILITOT 0.4 12/10/2013 0637       RADIOGRAPHIC STUDIES: Dg Chest 2 View  10/02/2014   CLINICAL DATA:  LEFT-SIDED CHEST PAIN.  HISTORY OF LUNG CANCER  EXAM: CHEST  2 VIEW  COMPARISON:  CHEST CT 08/28/2014  FINDINGS: Left subclavian porta catheter tip remains in good position.  Volume loss on the left with streaky perihilar opacity is stable from 08/28/2014, lingular malignancy and postobstructive change. There is chronic and stable elevation of the left diaphragm. Pulmonary metastases are not well visualized. There is no edema, consolidation, effusion, or pneumothorax. Normal heart size and stable aortic contours.  IMPRESSION: 1. No evidence of acute disease. 2. Metastatic lung cancer and chronic mild postobstructive change in the lingula.   Electronically Signed   By: Monte Fantasia M.D.   On: 10/02/2014 08:08    ASSESSMENT AND PLAN: This is a very pleasant 69 years old white female recently diagnosed with:  1) Stage IV non-small cell lung cancer, squamous cell carcinoma completed systemic chemotherapy with carboplatin and Abraxane status post 6 cycles.  She was on treatment with immunotherapy according to the BMS checkmate 370 clinical trial with Nivolumab.she status post 7 cycles and tolerating her last treatment fairly well with no significant diarrhea.  Unfortunately the recent CT scan of the chest, abdomen and pelvis showed evidence for disease progression. The patient was discussed with and also seen by  Dr. [name]. She will proceed with cycle #2 today as scheduled. She will continue with weekly labs as scheduled and follow up in 3 weeks prior to the start of cycle #3.  2) Hypomagnesemia, she has received magnesium sulfate and we will continue to monitor her level closely.  3) Hyperthyroidism, the patient will continue her treatment with methimazole and follow-up visit by her endocrinologist.  4) For the peripheral neuropathy, she has been on Neurontin in the past but this was discontinued secondary to diarrhea and she did not notice any improvement in her peripheral neuropathy while taking the medication. The patient was advised to call immediately if she has any concerning symptoms  in the interval.  The patient voices understanding of current disease status and treatment options and is in agreement with the current care plan.  All questions were answered. The patient knows to call the clinic with any problems, questions or concerns. We can certainly see the patient much sooner if necessary.  Carlton Adam PA-C   ADDENDUM: Hematology/Oncology Attending: I had a face to face encounter with the patient. I recommended her care plan. This is a very pleasant 69 years old white female with metastatic non-small cell lung cancer, squamous cell carcinoma status post induction chemotherapy with carboplatin and Abraxane followed by maintenance treatment on a clinical trial with Nivolumab status post 7 cycles discontinued secondary to disease progression and diarrhea. She is currently undergoing second line systemic chemotherapy with docetaxel and Cyramza status post 1 cycle. She is tolerating her treatment well with no specific complaints except for fatigue. I recommended for the patient to proceed with cycle #2 today as scheduled. She will come back for follow-up visit in 3 weeks for reevaluation before starting cycle #3. The patient was advised to call immediately if she has any concerning symptoms  in the interval.  Disclaimer: This note was dictated with voice recognition software. Similar sounding words can inadvertently be transcribed and may be missed upon review.  Eilleen Kempf., MD 10/08/2014

## 2014-10-14 ENCOUNTER — Telehealth: Payer: Self-pay | Admitting: Medical Oncology

## 2014-10-14 NOTE — Telephone Encounter (Signed)
Patient called requesting refill for her temazepam (Restoril) for sleep, states she wasn't sure who to call. Patient reports she will be here tomorrow for labs and was hoping to be able to get a refill prescription while here. Informed patient that I will let Dr. Worthy Flank desk nurse know of her request and informed patient for future refills she can call her pharmacy or the [phone] number and inform them of her needs and she will be directed to appropriate area for refills. Patient expressed verbal understanding. Patient reports she is doing well, reports no diarrhea in a "few weeks" now. Encouraged patient to call me with any questions or concerns related to BMS 370 study. Patient expressed thanks, denies further questions at this time.  Mssg routed to MD and Armanda Magic, RN desk nurse.

## 2014-10-15 ENCOUNTER — Other Ambulatory Visit (HOSPITAL_BASED_OUTPATIENT_CLINIC_OR_DEPARTMENT_OTHER): Payer: Medicare Other

## 2014-10-15 ENCOUNTER — Other Ambulatory Visit: Payer: Self-pay | Admitting: *Deleted

## 2014-10-15 DIAGNOSIS — C3412 Malignant neoplasm of upper lobe, left bronchus or lung: Secondary | ICD-10-CM | POA: Diagnosis not present

## 2014-10-15 DIAGNOSIS — C3492 Malignant neoplasm of unspecified part of left bronchus or lung: Secondary | ICD-10-CM

## 2014-10-15 DIAGNOSIS — R197 Diarrhea, unspecified: Secondary | ICD-10-CM

## 2014-10-15 LAB — CBC WITH DIFFERENTIAL/PLATELET
BASO%: 0.2 % (ref 0.0–2.0)
Basophils Absolute: 0 10e3/uL (ref 0.0–0.1)
EOS%: 0.2 % (ref 0.0–7.0)
Eosinophils Absolute: 0 10e3/uL (ref 0.0–0.5)
HCT: 35 % (ref 34.8–46.6)
HGB: 11.3 g/dL — ABNORMAL LOW (ref 11.6–15.9)
LYMPH%: 10.2 % — ABNORMAL LOW (ref 14.0–49.7)
MCH: 28.9 pg (ref 25.1–34.0)
MCHC: 32.3 g/dL (ref 31.5–36.0)
MCV: 89.5 fL (ref 79.5–101.0)
MONO#: 0.8 10e3/uL (ref 0.1–0.9)
MONO%: 4.8 % (ref 0.0–14.0)
NEUT#: 14.1 10e3/uL — ABNORMAL HIGH (ref 1.5–6.5)
NEUT%: 84.6 % — ABNORMAL HIGH (ref 38.4–76.8)
Platelets: 195 10e3/uL (ref 145–400)
RBC: 3.91 10e6/uL (ref 3.70–5.45)
RDW: 19.1 % — ABNORMAL HIGH (ref 11.2–14.5)
WBC: 16.7 10e3/uL — ABNORMAL HIGH (ref 3.9–10.3)
lymph#: 1.7 10e3/uL (ref 0.9–3.3)

## 2014-10-15 LAB — COMPREHENSIVE METABOLIC PANEL (CC13)
ALT: 17 U/L (ref 0–55)
ANION GAP: 10 meq/L (ref 3–11)
AST: 18 U/L (ref 5–34)
Albumin: 3.6 g/dL (ref 3.5–5.0)
Alkaline Phosphatase: 124 U/L (ref 40–150)
BUN: 15.6 mg/dL (ref 7.0–26.0)
CHLORIDE: 103 meq/L (ref 98–109)
CO2: 30 meq/L — AB (ref 22–29)
CREATININE: 1 mg/dL (ref 0.6–1.1)
Calcium: 9.1 mg/dL (ref 8.4–10.4)
EGFR: 55 mL/min/{1.73_m2} — ABNORMAL LOW (ref >90)
Glucose: 99 mg/dl (ref 70–140)
Potassium: 3.8 mEq/L (ref 3.5–5.1)
SODIUM: 142 meq/L (ref 136–145)
Total Bilirubin: 0.23 mg/dL (ref 0.20–1.20)
Total Protein: 6.8 g/dL (ref 6.4–8.3)

## 2014-10-15 MED ORDER — TEMAZEPAM 30 MG PO CAPS: 30.0000 mg | ORAL_CAPSULE | Freq: Every evening | ORAL | Status: DC | PRN

## 2014-10-22 ENCOUNTER — Other Ambulatory Visit (HOSPITAL_BASED_OUTPATIENT_CLINIC_OR_DEPARTMENT_OTHER): Payer: Medicare Other

## 2014-10-22 ENCOUNTER — Ambulatory Visit (HOSPITAL_BASED_OUTPATIENT_CLINIC_OR_DEPARTMENT_OTHER): Payer: Medicare Other

## 2014-10-22 ENCOUNTER — Ambulatory Visit (HOSPITAL_BASED_OUTPATIENT_CLINIC_OR_DEPARTMENT_OTHER): Payer: Medicare Other | Admitting: Nurse Practitioner

## 2014-10-22 ENCOUNTER — Telehealth: Payer: Self-pay | Admitting: Internal Medicine

## 2014-10-22 VITALS — BP 109/70 | HR 83 | Temp 98.2°F | Resp 22

## 2014-10-22 VITALS — BP 138/67 | HR 105 | Temp 98.7°F | Resp 18 | Ht 62.0 in | Wt 153.8 lb

## 2014-10-22 DIAGNOSIS — Z5189 Encounter for other specified aftercare: Secondary | ICD-10-CM

## 2014-10-22 DIAGNOSIS — C3492 Malignant neoplasm of unspecified part of left bronchus or lung: Secondary | ICD-10-CM

## 2014-10-22 DIAGNOSIS — C3412 Malignant neoplasm of upper lobe, left bronchus or lung: Secondary | ICD-10-CM

## 2014-10-22 DIAGNOSIS — Z5112 Encounter for antineoplastic immunotherapy: Secondary | ICD-10-CM | POA: Diagnosis not present

## 2014-10-22 DIAGNOSIS — Z5111 Encounter for antineoplastic chemotherapy: Secondary | ICD-10-CM | POA: Diagnosis not present

## 2014-10-22 LAB — COMPREHENSIVE METABOLIC PANEL (CC13)
ALBUMIN: 3.6 g/dL (ref 3.5–5.0)
ALK PHOS: 82 U/L (ref 40–150)
ALT: 25 U/L (ref 0–55)
ANION GAP: 10 meq/L (ref 3–11)
AST: 21 U/L (ref 5–34)
BUN: 24 mg/dL (ref 7.0–26.0)
CALCIUM: 9.4 mg/dL (ref 8.4–10.4)
CO2: 28 mEq/L (ref 22–29)
CREATININE: 1 mg/dL (ref 0.6–1.1)
Chloride: 102 mEq/L (ref 98–109)
EGFR: 58 mL/min/{1.73_m2} — ABNORMAL LOW (ref >90)
Glucose: 124 mg/dl (ref 70–140)
Potassium: 3.9 mEq/L (ref 3.5–5.1)
Sodium: 140 mEq/L (ref 136–145)
Total Bilirubin: 0.22 mg/dL (ref 0.20–1.20)
Total Protein: 6.7 g/dL (ref 6.4–8.3)

## 2014-10-22 LAB — CBC WITH DIFFERENTIAL/PLATELET
BASO%: 0.5 % (ref 0.0–2.0)
BASOS ABS: 0.1 10*3/uL (ref 0.0–0.1)
EOS%: 0 % (ref 0.0–7.0)
Eosinophils Absolute: 0 10*3/uL (ref 0.0–0.5)
HEMATOCRIT: 33.3 % — AB (ref 34.8–46.6)
HEMOGLOBIN: 10.8 g/dL — AB (ref 11.6–15.9)
LYMPH#: 1.3 10*3/uL (ref 0.9–3.3)
LYMPH%: 9.4 % — ABNORMAL LOW (ref 14.0–49.7)
MCH: 29 pg (ref 25.1–34.0)
MCHC: 32.5 g/dL (ref 31.5–36.0)
MCV: 89.2 fL (ref 79.5–101.0)
MONO#: 0.6 10*3/uL (ref 0.1–0.9)
MONO%: 4.7 % (ref 0.0–14.0)
NEUT#: 11.6 10*3/uL — ABNORMAL HIGH (ref 1.5–6.5)
NEUT%: 85.4 % — ABNORMAL HIGH (ref 38.4–76.8)
PLATELETS: 336 10*3/uL (ref 145–400)
RBC: 3.73 10*6/uL (ref 3.70–5.45)
RDW: 20.9 % — AB (ref 11.2–14.5)
WBC: 13.6 10*3/uL — ABNORMAL HIGH (ref 3.9–10.3)

## 2014-10-22 MED ORDER — SODIUM CHLORIDE 0.9 % IV SOLN
Freq: Once | INTRAVENOUS | Status: AC
  Administered 2014-10-22: 14:00:00 via INTRAVENOUS

## 2014-10-22 MED ORDER — PEGFILGRASTIM 6 MG/0.6ML ~~LOC~~ PSKT
6.0000 mg | PREFILLED_SYRINGE | Freq: Once | SUBCUTANEOUS | Status: AC
  Administered 2014-10-22: 6 mg via SUBCUTANEOUS
  Filled 2014-10-22: qty 0.6

## 2014-10-22 MED ORDER — DIPHENHYDRAMINE HCL 50 MG/ML IJ SOLN
50.0000 mg | Freq: Once | INTRAMUSCULAR | Status: AC
  Administered 2014-10-22: 50 mg via INTRAVENOUS

## 2014-10-22 MED ORDER — HEPARIN SOD (PORK) LOCK FLUSH 100 UNIT/ML IV SOLN
500.0000 [IU] | Freq: Once | INTRAVENOUS | Status: AC | PRN
  Administered 2014-10-22: 500 [IU]
  Filled 2014-10-22: qty 5

## 2014-10-22 MED ORDER — ACETAMINOPHEN 325 MG PO TABS
ORAL_TABLET | ORAL | Status: AC
  Filled 2014-10-22: qty 2

## 2014-10-22 MED ORDER — DIPHENHYDRAMINE HCL 50 MG/ML IJ SOLN
INTRAMUSCULAR | Status: AC
  Filled 2014-10-22: qty 1

## 2014-10-22 MED ORDER — SODIUM CHLORIDE 0.9 % IV SOLN
Freq: Once | INTRAVENOUS | Status: AC
  Administered 2014-10-22: 14:00:00 via INTRAVENOUS
  Filled 2014-10-22: qty 4

## 2014-10-22 MED ORDER — SODIUM CHLORIDE 0.9 % IV SOLN
10.0000 mg/kg | Freq: Once | INTRAVENOUS | Status: AC
  Administered 2014-10-22: 700 mg via INTRAVENOUS
  Filled 2014-10-22: qty 70

## 2014-10-22 MED ORDER — ACETAMINOPHEN 325 MG PO TABS
650.0000 mg | ORAL_TABLET | Freq: Once | ORAL | Status: AC
Start: 2014-10-22 — End: 2014-10-22
  Administered 2014-10-22: 650 mg via ORAL

## 2014-10-22 MED ORDER — DOCETAXEL CHEMO INJECTION 160 MG/16ML
75.0000 mg/m2 | Freq: Once | INTRAVENOUS | Status: AC
  Administered 2014-10-22: 130 mg via INTRAVENOUS
  Filled 2014-10-22: qty 13

## 2014-10-22 MED ORDER — SODIUM CHLORIDE 0.9 % IJ SOLN
10.0000 mL | INTRAMUSCULAR | Status: DC | PRN
  Administered 2014-10-22: 10 mL
  Filled 2014-10-22: qty 10

## 2014-10-22 NOTE — Patient Instructions (Signed)
[facility] Discharge Instructions for Patients Receiving Chemotherapy  Today you received the following chemotherapy agents: Cyramza, Taxotere  To help prevent nausea and vomiting after your treatment, we encourage you to take your nausea medication as directed.    If you develop nausea and vomiting that is not controlled by your nausea medication, call the clinic.   BELOW ARE SYMPTOMS THAT SHOULD BE REPORTED IMMEDIATELY:  *FEVER GREATER THAN 100.5 F  *CHILLS WITH OR WITHOUT FEVER  NAUSEA AND VOMITING THAT IS NOT CONTROLLED WITH YOUR NAUSEA MEDICATION  *UNUSUAL SHORTNESS OF BREATH  *UNUSUAL BRUISING OR BLEEDING  TENDERNESS IN MOUTH AND THROAT WITH OR WITHOUT PRESENCE OF ULCERS  *URINARY PROBLEMS  *BOWEL PROBLEMS  UNUSUAL RASH Items with * indicate a potential emergency and should be followed up as soon as possible.  Feel free to call the clinic you have any questions or concerns. The clinic phone number is [phone].  Please show the [facility] at check-in to the Emergency Department and triage nurse.   Ramucirumab injection What is this medicine? RAMUCIRUMAB (ra mue SIR ue mab) is a chemotherapy drug. It is used to treat stomach cancer, colorectal cancer, or lung cancer. This drug targets a specific protein receptor on cancer cells and stops the cancer cells from growing. This medicine may be used for other purposes; ask your health care provider or pharmacist if you have questions. COMMON BRAND NAME(S): Cyramza What should I tell my health care provider before I take this medicine? They need to know if you have any of these conditions: -bleeding disorders -blood clots -heart disease, including heart failure, heart attack, or chest pain (angina) -high blood pressure -infection (especially a virus infection such as chickenpox, cold sores, or herpes) -protein in your urine -recent surgery -stroke -an unusual or allergic reaction to  ramucirumab, other medicines, foods, dyes, or preservatives -pregnant or trying to get pregnant -breast-feeding How should I use this medicine? This medicine is for infusion into a vein. It is given by a health care professional in a hospital or clinic setting. Talk to your pediatrician regarding the use of this medicine in children. Special care may be needed. Overdosage: If you think you've taken too much of this medicine contact a poison control center or emergency room at once. Overdosage: If you think you have taken too much of this medicine contact a poison control center or emergency room at once. NOTE: This medicine is only for you. Do not share this medicine with others. What if I miss a dose? It is important not to miss your dose. Call your doctor or health care professional if you are unable to keep an appointment. What may interact with this medicine? Interactions have not been studied. This list may not describe all possible interactions. Give your health care provider a list of all the medicines, herbs, non-prescription drugs, or dietary supplements you use. Also tell them if you smoke, drink alcohol, or use illegal drugs. Some items may interact with your medicine. What should I watch for while using this medicine? Your condition will be monitored carefully while you are receiving this medicine. You will need to to check your blood pressure and have your blood and urine tested while you are taking this medicine. Your condition will be monitored carefully while you are receiving this medicine. This medicine may increase your risk to bruise or bleed. Call your doctor or health care professional if you notice any unusual bleeding. This medicine may rarely cause '  gastrointestinal perforation' (holes in the stomach, intestines or colon), a serious side effect requiring surgery to repair. This medicine should be started at least 28 days following major surgery and the site of the surgery  should be totally healed. Check with your doctor before scheduling dental work or surgery while you are receiving this treatment. Talk to your doctor if you have recently had surgery or if you have a wound that has not healed. Do not become pregnant while taking this medicine or for 3 months after stopping it. Women should inform their doctor if they wish to become pregnant or think they might be pregnant. There is a potential for serious side effects to an unborn child. Talk to your health care professional or pharmacist for more information. What side effects may I notice from receiving this medicine? Side effects that you should report to your doctor or health care professional as soon as possible: -allergic reactions like skin rash, itching or hives, breathing problems, swelling of the face, lips, or tongue -signs of infection - fever or chills, cough, sore throat -chest pain or chest tightness -confusion -dizziness -feeling faint or lightheaded, falls -severe abdominal pain -severe nausea, vomiting -signs and symptoms of bleeding such as bloody or black, tarry stools; red or dark-brown urine; spitting up blood or brown material that looks like coffee grounds; red spots on the skin; unusual bruising or bleeding from the eye, gums, or nose -signs and symptoms of a blood clot such as breathing problems; changes in vision; chest pain; severe, sudden headache; pain, swelling, warmth in the leg; trouble speaking; sudden numbness or weakness of the face, arm or leg -symptoms of a stroke: change in mental awareness, inability to talk or move one side of the body -trouble walking, dizziness, loss of balance or coordination Side effects that usually do not require medical attention (Report these to your doctor or health care professional if they continue or are bothersome.): -cold, clammy skin -constipation -diarrhea -headache -nausea, vomiting -stomach pain -unusually slow heartbeat -unusually  weak or tired This list may not describe all possible side effects. Call your doctor for medical advice about side effects. You may report side effects to FDA at 1-800-FDA-1088. Where should I keep my medicine? This drug is given in a hospital or clinic and will not be stored at home. NOTE: This sheet is a summary. It may not cover all possible information. If you have questions about this medicine, talk to your doctor, pharmacist, or health care provider.  2015, Elsevier/Gold Standard. (2013-06-04 13:05:27)   Docetaxel injection What is this medicine? DOCETAXEL (doe se TAX el) is a chemotherapy drug. It targets fast dividing cells, like cancer cells, and causes these cells to die. This medicine is used to treat many types of cancers like breast cancer, certain stomach cancers, head and neck cancer, lung cancer, and prostate cancer. This medicine may be used for other purposes; ask your health care provider or pharmacist if you have questions. COMMON BRAND NAME(S): Docefrez, Taxotere What should I tell my health care provider before I take this medicine? They need to know if you have any of these conditions: -infection (especially a virus infection such as chickenpox, cold sores, or herpes) -liver disease -low blood counts, like low white cell, platelet, or red cell counts -an unusual or allergic reaction to docetaxel, polysorbate 80, other chemotherapy agents, other medicines, foods, dyes, or preservatives -pregnant or trying to get pregnant -breast-feeding How should I use this medicine? This drug is given as  an infusion into a vein. It is administered in a hospital or clinic by a specially trained health care professional. Talk to your pediatrician regarding the use of this medicine in children. Special care may be needed. Overdosage: If you think you have taken too much of this medicine contact a poison control center or emergency room at once. NOTE: This medicine is only for you. Do  not share this medicine with others. What if I miss a dose? It is important not to miss your dose. Call your doctor or health care professional if you are unable to keep an appointment. What may interact with this medicine? -cyclosporine -erythromycin -ketoconazole -medicines to increase blood counts like filgrastim, pegfilgrastim, sargramostim -vaccines Talk to your doctor or health care professional before taking any of these medicines: -acetaminophen -aspirin -ibuprofen -ketoprofen -naproxen This list may not describe all possible interactions. Give your health care provider a list of all the medicines, herbs, non-prescription drugs, or dietary supplements you use. Also tell them if you smoke, drink alcohol, or use illegal drugs. Some items may interact with your medicine. What should I watch for while using this medicine? Your condition will be monitored carefully while you are receiving this medicine. You will need important blood work done while you are taking this medicine. This drug may make you feel generally unwell. This is not uncommon, as chemotherapy can affect healthy cells as well as cancer cells. Report any side effects. Continue your course of treatment even though you feel ill unless your doctor tells you to stop. In some cases, you may be given additional medicines to help with side effects. Follow all directions for their use. Call your doctor or health care professional for advice if you get a fever, chills or sore throat, or other symptoms of a cold or flu. Do not treat yourself. This drug decreases your body's ability to fight infections. Try to avoid being around people who are sick. This medicine may increase your risk to bruise or bleed. Call your doctor or health care professional if you notice any unusual bleeding. Be careful brushing and flossing your teeth or using a toothpick because you may get an infection or bleed more easily. If you have any dental work done,  tell your dentist you are receiving this medicine. Avoid taking products that contain aspirin, acetaminophen, ibuprofen, naproxen, or ketoprofen unless instructed by your doctor. These medicines may hide a fever. This medicine contains an alcohol in the product. You may get drowsy or dizzy. Do not drive, use machinery, or do anything that needs mental alertness until you know how this medicine affects you. Do not stand or sit up quickly, especially if you are an older patient. This reduces the risk of dizzy or fainting spells. Avoid alcoholic drinks Do not become pregnant while taking this medicine. Women should inform their doctor if they wish to become pregnant or think they might be pregnant. There is a potential for serious side effects to an unborn child. Talk to your health care professional or pharmacist for more information. Do not breast-feed an infant while taking this medicine. What side effects may I notice from receiving this medicine? Side effects that you should report to your doctor or health care professional as soon as possible: -allergic reactions like skin rash, itching or hives, swelling of the face, lips, or tongue -low blood counts - This drug may decrease the number of white blood cells, red blood cells and platelets. You may be at increased risk for  infections and bleeding. -signs of infection - fever or chills, cough, sore throat, pain or difficulty passing urine -signs of decreased platelets or bleeding - bruising, pinpoint red spots on the skin, black, tarry stools, nosebleeds -signs of decreased red blood cells - unusually weak or tired, fainting spells, lightheadedness -breathing problems -fast or irregular heartbeat -low blood pressure -mouth sores -nausea and vomiting -pain, swelling, redness or irritation at the injection site -pain, tingling, numbness in the hands or feet -swelling of the ankle, feet, hands -weight gain Side effects that usually do not require  medical attention (report to your prescriber or health care professional if they continue or are bothersome): -bone pain -complete hair loss including hair on your head, underarms, pubic hair, eyebrows, and eyelashes -diarrhea -excessive tearing -changes in the color of fingernails -loosening of the fingernails -nausea -muscle pain -red flush to skin -sweating -weak or tired This list may not describe all possible side effects. Call your doctor for medical advice about side effects. You may report side effects to FDA at 1-800-FDA-1088. Where should I keep my medicine? This drug is given in a hospital or clinic and will not be stored at home. NOTE: This sheet is a summary. It may not cover all possible information. If you have questions about this medicine, talk to your doctor, pharmacist, or health care provider.  2015, Elsevier/Gold Standard. (2012-12-20 22:21:02)

## 2014-10-22 NOTE — Telephone Encounter (Signed)
Pt confirmed labs/ov per 09/14 POF, gave pt AVS and Calendar.... KJ, chemo already out and gave pt barium

## 2014-10-22 NOTE — Progress Notes (Addendum)
[facility] OFFICE PROGRESS NOTE   DIAGNOSIS: Stage IV (T2, N2, M1a) non-small cell lung cancer, squamous cell carcinoma presented with central left upper lobe lung mass with associated postobstructive atelectasis as well as ipsilateral and subcarinal lymphadenopathy as well as pleural tumor spread diagnosed in October 2015.  PRIOR THERAPY:  1) Systemic chemotherapy with carboplatin for AUC of 5 on day 1 and Abraxane 100 MG/M2 on days 1, 8 and 15 every 3 weeks. Status post 6 cycles.  2) Nivolumabper research protocol BMS 370 group B, arm A, given every 2 weeks. Status post 7 cycles. Last dose was given 08/13/2014 discontinued on 08/28/2014 secondary to disease progression.  CURRENT THERAPY: Systemic chemotherapy with docetaxel 75 MG/M2 and Cyramza 10 MG/KG every 3 weeks. First dose 09/10/2014.     INTERVAL HISTORY:   Ms. Fede returns as scheduled. She completed cycle 2 Taxotere/cyramza 10/01/2014. She denies nausea/vomiting. No mouth sores. No diarrhea or constipation. No skin rash. No change in baseline neuropathy symptoms. She has stable dyspnea exertion. No cough. No hemoptysis. She notes an alteration in taste. She experiences generalized "achiness" for several days after treatment. Tylenol is not effective.  Objective:  Vital signs in last 24 hours:  Blood pressure 138/67, pulse 105, temperature 98.7 F (37.1 C), temperature source Oral, resp. rate 18, height '5\' 2"'$  (1.575 m), weight 153 lb 12.8 oz (69.763 kg), SpO2 97 %.    HEENT: No thrush or ulcers. Resp: Distant breath sounds. Cardio: Regular rate and rhythm. GI: Abdomen soft and nontender. No hepatomegaly. Vascular: No leg edema. Calves soft and nontender. Skin: No rash. Port-A-Cath without erythema.    Lab Results:  Lab Results  Component Value Date   WBC 13.6* 10/22/2014   HGB 10.8* 10/22/2014   HCT 33.3* 10/22/2014   MCV 89.2 10/22/2014   PLT 336 10/22/2014   NEUTROABS 11.6* 10/22/2014     Imaging:  No results found.  Medications: I have reviewed the patient's current medications.  Assessment/Plan: 1. Stage IV non-small cell lung cancer, squamous cell carcinoma, initially treated with systemic chemotherapy with carboplatin and Abraxane status post 6 cycles. She was then treated with immunotherapy according to the BMS CheckMate 370 clinical trial with nivolumab. She completed 7 cycles. Restaging CT evaluation 08/28/2014 showed evidence of progression. She completed cycle 1 Taxotere/cyramza 09/10/2014; cycle 2 10/01/2014. 2. Hypomagnesemia. She has received IV magnesium in the past. 3. Hyperthyroidism. She continues methimazole. 4. Peripheral neuropathy. Stable.   Disposition: Ms. Wilborn appears stable. She has completed 2 cycles of Taxotere/cyramza. Plan to proceed with cycle 3 today as scheduled. She will have restaging CT scans just prior to her next visit in 3 weeks.  She is experiencing arthralgias after treatment. This may be due to Neulasta and/or Taxotere. She will try ibuprofen.  Ms. Orchard will return for a follow-up visit in 3 weeks. She will contact the office in the interim with any problems.  Patient seen with Dr. [name].    [name] ANP/GNP-BC   10/22/2014  12:24 PM  ADDENDUM: Hematology/Oncology Attending: I had a face to face encounter with the patient. I recommended her care plan. This is a very pleasant 69 years old white female with a stage IV non-small cell lung cancer status post several chemotherapy regimens and she is currently undergoing treatment with docetaxel and Cyramza status post 2 cycles and tolerating her treatment well except for fatigue after the Neulasta injection. The patient is here today to start cycle #3 and she is feeling fine.  We will proceed with treatment as scheduled. The patient had several questions about the other forms of immunotherapy including [place] (pembrolizumab) and Tecentriq Acupuncturist). I explained to  the patient that these treatment have some similarity to Nivolumab which she received in the past. I recommended for her to continue with her systemic chemotherapy as a scheduled. She would come back for follow-up visit in 3 weeks for evaluation after repeating CT scan of the chest, abdomen and pelvis for restaging of her disease. For the history of hyperthyroidism, the patient will continue on methimazole. She was advised to call immediately if she has any concerning symptoms in the interval.  Disclaimer: This note was dictated with voice recognition software. Similar sounding words can inadvertently be transcribed and may be missed upon review. Eilleen Kempf., MD 10/25/2014

## 2014-10-24 ENCOUNTER — Ambulatory Visit: Payer: Medicare Other

## 2014-10-27 ENCOUNTER — Telehealth: Payer: Self-pay | Admitting: *Deleted

## 2014-10-27 NOTE — Telephone Encounter (Signed)
Pt called to state diarrhea onset late Thursday- Sunday 2 times a day with consistency of liquid with some form.  Woke early this am with diarrhea x 4- eventually took an imodium and has not had further diarrhea since am.  Pt has imodium and lomotil in home for use " as needed "  Pt is able to hydrate well at time of call has had " several glasses of water ".  Pt denies any nausea.  Per phone inquiry pt asked about " how to use the anti diarrhea medication and how often she can take it ".  This RN discussed with chemo induced bowel changes and need to use the antidiarrhea when stool is very large and loose and greater the 2 times a day.  She may use antidiarrhea up to 6 times a day but requested her to call if she is using the medication and not obtaining good results.  Discussed with her hydration including her inquiry related to " how do I know I am dehydrated " post discussion pt verbalized understanding of monitoring color of urine ( not too yellow or dark or malodorous ) and dry mouth.  Neulasta patch- concerns " I do not like it " - states she had same side effects related to receiving in office injection but also " just made me feel anxious wearing it ".  Oretha would prefer to come in for injection of neulasta vs wearing the patch.  Per end of conversation Tanza verbalized understanding of how to use antidiarrhea medcation- hydration concerns and s/s of dehydration.  Informed her call would be forwarded to MD rn for review and need for any additional recommendations.  Andrian verbalized understanding to call if needed.

## 2014-10-28 ENCOUNTER — Other Ambulatory Visit: Payer: Self-pay | Admitting: Medical Oncology

## 2014-10-28 DIAGNOSIS — C3492 Malignant neoplasm of unspecified part of left bronchus or lung: Secondary | ICD-10-CM

## 2014-10-29 ENCOUNTER — Other Ambulatory Visit (HOSPITAL_BASED_OUTPATIENT_CLINIC_OR_DEPARTMENT_OTHER): Payer: Medicare Other

## 2014-10-29 DIAGNOSIS — C3412 Malignant neoplasm of upper lobe, left bronchus or lung: Secondary | ICD-10-CM | POA: Diagnosis not present

## 2014-10-29 DIAGNOSIS — C3492 Malignant neoplasm of unspecified part of left bronchus or lung: Secondary | ICD-10-CM

## 2014-10-29 LAB — CBC WITH DIFFERENTIAL/PLATELET
BASO%: 0.7 % (ref 0.0–2.0)
Basophils Absolute: 0.1 10*3/uL (ref 0.0–0.1)
EOS ABS: 0.1 10*3/uL (ref 0.0–0.5)
EOS%: 0.3 % (ref 0.0–7.0)
HEMATOCRIT: 33.8 % — AB (ref 34.8–46.6)
HEMOGLOBIN: 11.2 g/dL — AB (ref 11.6–15.9)
LYMPH#: 2.2 10*3/uL (ref 0.9–3.3)
LYMPH%: 14 % (ref 14.0–49.7)
MCH: 29.8 pg (ref 25.1–34.0)
MCHC: 33.1 g/dL (ref 31.5–36.0)
MCV: 89.9 fL (ref 79.5–101.0)
MONO#: 1.9 10*3/uL — AB (ref 0.1–0.9)
MONO%: 12 % (ref 0.0–14.0)
NEUT%: 73 % (ref 38.4–76.8)
NEUTROS ABS: 11.6 10*3/uL — AB (ref 1.5–6.5)
PLATELETS: 208 10*3/uL (ref 145–400)
RBC: 3.76 10*6/uL (ref 3.70–5.45)
RDW: 19.7 % — AB (ref 11.2–14.5)
WBC: 15.9 10*3/uL — AB (ref 3.9–10.3)

## 2014-10-29 LAB — COMPREHENSIVE METABOLIC PANEL (CC13)
ALBUMIN: 3.5 g/dL (ref 3.5–5.0)
ALK PHOS: 126 U/L (ref 40–150)
ALT: 20 U/L (ref 0–55)
ANION GAP: 10 meq/L (ref 3–11)
AST: 21 U/L (ref 5–34)
BILIRUBIN TOTAL: 0.3 mg/dL (ref 0.20–1.20)
BUN: 18 mg/dL (ref 7.0–26.0)
CO2: 29 meq/L (ref 22–29)
CREATININE: 0.8 mg/dL (ref 0.6–1.1)
Calcium: 9.4 mg/dL (ref 8.4–10.4)
Chloride: 99 mEq/L (ref 98–109)
EGFR: 71 mL/min/{1.73_m2} — AB (ref >90)
Glucose: 92 mg/dl (ref 70–140)
Potassium: 3.6 mEq/L (ref 3.5–5.1)
Sodium: 138 mEq/L (ref 136–145)
TOTAL PROTEIN: 6.4 g/dL (ref 6.4–8.3)

## 2014-11-05 ENCOUNTER — Other Ambulatory Visit (HOSPITAL_BASED_OUTPATIENT_CLINIC_OR_DEPARTMENT_OTHER): Payer: Medicare Other

## 2014-11-05 DIAGNOSIS — C3492 Malignant neoplasm of unspecified part of left bronchus or lung: Secondary | ICD-10-CM

## 2014-11-05 LAB — COMPREHENSIVE METABOLIC PANEL (CC13)
ALBUMIN: 3.7 g/dL (ref 3.5–5.0)
ALK PHOS: 113 U/L (ref 40–150)
ALT: 22 U/L (ref 0–55)
ANION GAP: 9 meq/L (ref 3–11)
AST: 24 U/L (ref 5–34)
BUN: 17.2 mg/dL (ref 7.0–26.0)
CALCIUM: 9.2 mg/dL (ref 8.4–10.4)
CO2: 29 mEq/L (ref 22–29)
CREATININE: 0.9 mg/dL (ref 0.6–1.1)
Chloride: 102 mEq/L (ref 98–109)
EGFR: 64 mL/min/{1.73_m2} — AB (ref >90)
Glucose: 85 mg/dl (ref 70–140)
Potassium: 4 mEq/L (ref 3.5–5.1)
Sodium: 139 mEq/L (ref 136–145)
TOTAL PROTEIN: 6.7 g/dL (ref 6.4–8.3)

## 2014-11-05 LAB — CBC WITH DIFFERENTIAL/PLATELET
BASO%: 0.9 % (ref 0.0–2.0)
Basophils Absolute: 0.1 10*3/uL (ref 0.0–0.1)
EOS ABS: 0 10*3/uL (ref 0.0–0.5)
EOS%: 0.2 % (ref 0.0–7.0)
HEMATOCRIT: 35.8 % (ref 34.8–46.6)
HGB: 11.5 g/dL — ABNORMAL LOW (ref 11.6–15.9)
LYMPH#: 1.4 10*3/uL (ref 0.9–3.3)
LYMPH%: 10.8 % — AB (ref 14.0–49.7)
MCH: 29.2 pg (ref 25.1–34.0)
MCHC: 32.2 g/dL (ref 31.5–36.0)
MCV: 90.7 fL (ref 79.5–101.0)
MONO#: 0.4 10*3/uL (ref 0.1–0.9)
MONO%: 3.5 % (ref 0.0–14.0)
NEUT%: 84.6 % — ABNORMAL HIGH (ref 38.4–76.8)
NEUTROS ABS: 10.7 10*3/uL — AB (ref 1.5–6.5)
PLATELETS: 219 10*3/uL (ref 145–400)
RBC: 3.95 10*6/uL (ref 3.70–5.45)
RDW: 22.5 % — ABNORMAL HIGH (ref 11.2–14.5)
WBC: 12.6 10*3/uL — AB (ref 3.9–10.3)

## 2014-11-10 ENCOUNTER — Encounter (HOSPITAL_COMMUNITY): Payer: Self-pay

## 2014-11-10 ENCOUNTER — Ambulatory Visit (HOSPITAL_COMMUNITY)
Admission: RE | Admit: 2014-11-10 | Discharge: 2014-11-10 | Disposition: A | Discharge status: Home / Self Care | Payer: Medicare Other | Source: Ambulatory Visit | Attending: Nurse Practitioner | Admitting: Nurse Practitioner

## 2014-11-10 DIAGNOSIS — Q632 Ectopic kidney: Secondary | ICD-10-CM | POA: Insufficient documentation

## 2014-11-10 DIAGNOSIS — C3492 Malignant neoplasm of unspecified part of left bronchus or lung: Secondary | ICD-10-CM

## 2014-11-10 DIAGNOSIS — R918 Other nonspecific abnormal finding of lung field: Secondary | ICD-10-CM | POA: Insufficient documentation

## 2014-11-10 DIAGNOSIS — K769 Liver disease, unspecified: Secondary | ICD-10-CM | POA: Insufficient documentation

## 2014-11-10 DIAGNOSIS — R590 Localized enlarged lymph nodes: Secondary | ICD-10-CM | POA: Diagnosis not present

## 2014-11-10 DIAGNOSIS — I2510 Atherosclerotic heart disease of native coronary artery without angina pectoris: Secondary | ICD-10-CM | POA: Diagnosis not present

## 2014-11-10 DIAGNOSIS — I714 Abdominal aortic aneurysm, without rupture: Secondary | ICD-10-CM | POA: Diagnosis not present

## 2014-11-10 MED ORDER — IOHEXOL 300 MG/ML  SOLN
100.0000 mL | Freq: Once | INTRAMUSCULAR | Status: AC | PRN
  Administered 2014-11-10: 100 mL via INTRAVENOUS

## 2014-11-12 ENCOUNTER — Encounter: Payer: Self-pay | Admitting: Internal Medicine

## 2014-11-12 ENCOUNTER — Ambulatory Visit (HOSPITAL_BASED_OUTPATIENT_CLINIC_OR_DEPARTMENT_OTHER): Payer: Medicare Other

## 2014-11-12 ENCOUNTER — Telehealth: Payer: Self-pay | Admitting: Internal Medicine

## 2014-11-12 ENCOUNTER — Ambulatory Visit (HOSPITAL_BASED_OUTPATIENT_CLINIC_OR_DEPARTMENT_OTHER): Payer: Medicare Other | Admitting: Internal Medicine

## 2014-11-12 ENCOUNTER — Other Ambulatory Visit (HOSPITAL_BASED_OUTPATIENT_CLINIC_OR_DEPARTMENT_OTHER): Payer: Medicare Other

## 2014-11-12 DIAGNOSIS — C3492 Malignant neoplasm of unspecified part of left bronchus or lung: Secondary | ICD-10-CM

## 2014-11-12 DIAGNOSIS — G4700 Insomnia, unspecified: Secondary | ICD-10-CM

## 2014-11-12 DIAGNOSIS — C3412 Malignant neoplasm of upper lobe, left bronchus or lung: Secondary | ICD-10-CM | POA: Diagnosis not present

## 2014-11-12 DIAGNOSIS — Z5112 Encounter for antineoplastic immunotherapy: Secondary | ICD-10-CM

## 2014-11-12 LAB — COMPREHENSIVE METABOLIC PANEL (CC13)
ALT: 19 U/L (ref 0–55)
AST: 16 U/L (ref 5–34)
Albumin: 3.7 g/dL (ref 3.5–5.0)
Alkaline Phosphatase: 83 U/L (ref 40–150)
Anion Gap: 10 mEq/L (ref 3–11)
BUN: 22 mg/dL (ref 7.0–26.0)
CHLORIDE: 101 meq/L (ref 98–109)
CO2: 29 meq/L (ref 22–29)
Calcium: 9.7 mg/dL (ref 8.4–10.4)
Creatinine: 0.9 mg/dL (ref 0.6–1.1)
EGFR: 63 mL/min/{1.73_m2} — AB (ref >90)
GLUCOSE: 122 mg/dL (ref 70–140)
POTASSIUM: 4.1 meq/L (ref 3.5–5.1)
SODIUM: 139 meq/L (ref 136–145)
Total Bilirubin: <0.3 mg/dL (ref 0.20–1.20)
Total Protein: 6.7 g/dL (ref 6.4–8.3)

## 2014-11-12 LAB — CBC WITH DIFFERENTIAL/PLATELET
BASO%: 0.3 % (ref 0.0–2.0)
BASOS ABS: 0 10*3/uL (ref 0.0–0.1)
EOS%: 0.1 % (ref 0.0–7.0)
Eosinophils Absolute: 0 10*3/uL (ref 0.0–0.5)
HCT: 34.1 % — ABNORMAL LOW (ref 34.8–46.6)
HEMOGLOBIN: 11.4 g/dL — AB (ref 11.6–15.9)
LYMPH%: 13.5 % — AB (ref 14.0–49.7)
MCH: 30.5 pg (ref 25.1–34.0)
MCHC: 33.4 g/dL (ref 31.5–36.0)
MCV: 91.3 fL (ref 79.5–101.0)
MONO#: 0.6 10*3/uL (ref 0.1–0.9)
MONO%: 6.2 % (ref 0.0–14.0)
NEUT#: 8 10*3/uL — ABNORMAL HIGH (ref 1.5–6.5)
NEUT%: 79.9 % — AB (ref 38.4–76.8)
Platelets: 278 10*3/uL (ref 145–400)
RBC: 3.74 10*6/uL (ref 3.70–5.45)
RDW: 22.9 % — AB (ref 11.2–14.5)
WBC: 10 10*3/uL (ref 3.9–10.3)
lymph#: 1.3 10*3/uL (ref 0.9–3.3)

## 2014-11-12 LAB — MAGNESIUM (CC13): Magnesium: 1.5 mg/dl (ref 1.5–2.5)

## 2014-11-12 MED ORDER — SODIUM CHLORIDE 0.9 % IV SOLN
10.0000 mg/kg | Freq: Once | INTRAVENOUS | Status: AC
  Administered 2014-11-12: 700 mg via INTRAVENOUS
  Filled 2014-11-12: qty 50

## 2014-11-12 MED ORDER — HEPARIN SOD (PORK) LOCK FLUSH 100 UNIT/ML IV SOLN
500.0000 [IU] | Freq: Once | INTRAVENOUS | Status: AC | PRN
  Administered 2014-11-12: 500 [IU]
  Filled 2014-11-12: qty 5

## 2014-11-12 MED ORDER — SODIUM CHLORIDE 0.9 % IV SOLN
Freq: Once | INTRAVENOUS | Status: AC
  Administered 2014-11-12: 12:00:00 via INTRAVENOUS
  Filled 2014-11-12: qty 4

## 2014-11-12 MED ORDER — DIPHENHYDRAMINE HCL 50 MG/ML IJ SOLN
50.0000 mg | Freq: Once | INTRAMUSCULAR | Status: AC
  Administered 2014-11-12: 50 mg via INTRAVENOUS

## 2014-11-12 MED ORDER — TEMAZEPAM 30 MG PO CAPS: 30.0000 mg | ORAL_CAPSULE | Freq: Every evening | ORAL | Status: DC | PRN

## 2014-11-12 MED ORDER — ACETAMINOPHEN 325 MG PO TABS
ORAL_TABLET | ORAL | Status: AC
  Filled 2014-11-12: qty 2

## 2014-11-12 MED ORDER — SODIUM CHLORIDE 0.9 % IV SOLN
Freq: Once | INTRAVENOUS | Status: AC
  Administered 2014-11-12: 11:00:00 via INTRAVENOUS

## 2014-11-12 MED ORDER — SODIUM CHLORIDE 0.9 % IJ SOLN
10.0000 mL | INTRAMUSCULAR | Status: DC | PRN
  Administered 2014-11-12: 10 mL
  Filled 2014-11-12: qty 10

## 2014-11-12 MED ORDER — ACETAMINOPHEN 325 MG PO TABS
650.0000 mg | ORAL_TABLET | Freq: Once | ORAL | Status: AC
  Administered 2014-11-12: 650 mg via ORAL

## 2014-11-12 MED ORDER — DOCETAXEL CHEMO INJECTION 160 MG/16ML
75.0000 mg/m2 | Freq: Once | INTRAVENOUS | Status: AC
  Administered 2014-11-12: 130 mg via INTRAVENOUS
  Filled 2014-11-12: qty 13

## 2014-11-12 MED ORDER — DIPHENHYDRAMINE HCL 50 MG/ML IJ SOLN
INTRAMUSCULAR | Status: AC
  Filled 2014-11-12: qty 1

## 2014-11-12 NOTE — Telephone Encounter (Signed)
no pof gave pt copy of avs

## 2014-11-12 NOTE — Patient Instructions (Signed)
[facility] Discharge Instructions for Patients Receiving Chemotherapy  Today you received the following chemotherapy agents: Cyramza, Taxotere  To help prevent nausea and vomiting after your treatment, we encourage you to take your nausea medication as directed.    If you develop nausea and vomiting that is not controlled by your nausea medication, call the clinic.   BELOW ARE SYMPTOMS THAT SHOULD BE REPORTED IMMEDIATELY:  *FEVER GREATER THAN 100.5 F  *CHILLS WITH OR WITHOUT FEVER  NAUSEA AND VOMITING THAT IS NOT CONTROLLED WITH YOUR NAUSEA MEDICATION  *UNUSUAL SHORTNESS OF BREATH  *UNUSUAL BRUISING OR BLEEDING  TENDERNESS IN MOUTH AND THROAT WITH OR WITHOUT PRESENCE OF ULCERS  *URINARY PROBLEMS  *BOWEL PROBLEMS  UNUSUAL RASH Items with * indicate a potential emergency and should be followed up as soon as possible.  Feel free to call the clinic you have any questions or concerns. The clinic phone number is [phone].  Please show the [place] at check-in to the Emergency Department and triage nurse.   Ramucirumab injection What is this medicine? RAMUCIRUMAB (ra mue SIR ue mab) is a chemotherapy drug. It is used to treat stomach cancer, colorectal cancer, or lung cancer. This drug targets a specific protein receptor on cancer cells and stops the cancer cells from growing. This medicine may be used for other purposes; ask your health care Sharren Schnurr or pharmacist if you have questions. COMMON BRAND NAME(S): Cyramza What should I tell my health care Brenee Gajda before I take this medicine? They need to know if you have any of these conditions: -bleeding disorders -blood clots -heart disease, including heart failure, heart attack, or chest pain (angina) -high blood pressure -infection (especially a virus infection such as chickenpox, cold sores, or herpes) -protein in your urine -recent surgery -stroke -an unusual or allergic reaction to  ramucirumab, other medicines, foods, dyes, or preservatives -pregnant or trying to get pregnant -breast-feeding How should I use this medicine? This medicine is for infusion into a vein. It is given by a health care professional in a hospital or clinic setting. Talk to your pediatrician regarding the use of this medicine in children. Special care may be needed. Overdosage: If you think you've taken too much of this medicine contact a poison control center or emergency room at once. Overdosage: If you think you have taken too much of this medicine contact a poison control center or emergency room at once. NOTE: This medicine is only for you. Do not share this medicine with others. What if I miss a dose? It is important not to miss your dose. Call your doctor or health care professional if you are unable to keep an appointment. What may interact with this medicine? Interactions have not been studied. This list may not describe all possible interactions. Give your health care Okie Jansson a list of all the medicines, herbs, non-prescription drugs, or dietary supplements you use. Also tell them if you smoke, drink alcohol, or use illegal drugs. Some items may interact with your medicine. What should I watch for while using this medicine? Your condition will be monitored carefully while you are receiving this medicine. You will need to to check your blood pressure and have your blood and urine tested while you are taking this medicine. Your condition will be monitored carefully while you are receiving this medicine. This medicine may increase your risk to bruise or bleed. Call your doctor or health care professional if you notice any unusual bleeding. This medicine may rarely cause '  gastrointestinal perforation' (holes in the stomach, intestines or colon), a serious side effect requiring surgery to repair. This medicine should be started at least 28 days following major surgery and the site of the surgery  should be totally healed. Check with your doctor before scheduling dental work or surgery while you are receiving this treatment. Talk to your doctor if you have recently had surgery or if you have a wound that has not healed. Do not become pregnant while taking this medicine or for 3 months after stopping it. Women should inform their doctor if they wish to become pregnant or think they might be pregnant. There is a potential for serious side effects to an unborn child. Talk to your health care professional or pharmacist for more information. What side effects may I notice from receiving this medicine? Side effects that you should report to your doctor or health care professional as soon as possible: -allergic reactions like skin rash, itching or hives, breathing problems, swelling of the face, lips, or tongue -signs of infection - fever or chills, cough, sore throat -chest pain or chest tightness -confusion -dizziness -feeling faint or lightheaded, falls -severe abdominal pain -severe nausea, vomiting -signs and symptoms of bleeding such as bloody or black, tarry stools; red or dark-brown urine; spitting up blood or brown material that looks like coffee grounds; red spots on the skin; unusual bruising or bleeding from the eye, gums, or nose -signs and symptoms of a blood clot such as breathing problems; changes in vision; chest pain; severe, sudden headache; pain, swelling, warmth in the leg; trouble speaking; sudden numbness or weakness of the face, arm or leg -symptoms of a stroke: change in mental awareness, inability to talk or move one side of the body -trouble walking, dizziness, loss of balance or coordination Side effects that usually do not require medical attention (Report these to your doctor or health care professional if they continue or are bothersome.): -cold, clammy skin -constipation -diarrhea -headache -nausea, vomiting -stomach pain -unusually slow heartbeat -unusually  weak or tired This list may not describe all possible side effects. Call your doctor for medical advice about side effects. You may report side effects to FDA at 1-800-FDA-1088. Where should I keep my medicine? This drug is given in a hospital or clinic and will not be stored at home. NOTE: This sheet is a summary. It may not cover all possible information. If you have questions about this medicine, talk to your doctor, pharmacist, or health care Astha Probasco.  2015, Elsevier/Gold Standard. (2013-06-04 13:05:27)   Docetaxel injection What is this medicine? DOCETAXEL (doe se TAX el) is a chemotherapy drug. It targets fast dividing cells, like cancer cells, and causes these cells to die. This medicine is used to treat many types of cancers like breast cancer, certain stomach cancers, head and neck cancer, lung cancer, and prostate cancer. This medicine may be used for other purposes; ask your health care Tell Rozelle or pharmacist if you have questions. COMMON BRAND NAME(S): Docefrez, Taxotere What should I tell my health care Lemond Griffee before I take this medicine? They need to know if you have any of these conditions: -infection (especially a virus infection such as chickenpox, cold sores, or herpes) -liver disease -low blood counts, like low white cell, platelet, or red cell counts -an unusual or allergic reaction to docetaxel, polysorbate 80, other chemotherapy agents, other medicines, foods, dyes, or preservatives -pregnant or trying to get pregnant -breast-feeding How should I use this medicine? This drug is given as  an infusion into a vein. It is administered in a hospital or clinic by a specially trained health care professional. Talk to your pediatrician regarding the use of this medicine in children. Special care may be needed. Overdosage: If you think you have taken too much of this medicine contact a poison control center or emergency room at once. NOTE: This medicine is only for you. Do  not share this medicine with others. What if I miss a dose? It is important not to miss your dose. Call your doctor or health care professional if you are unable to keep an appointment. What may interact with this medicine? -cyclosporine -erythromycin -ketoconazole -medicines to increase blood counts like filgrastim, pegfilgrastim, sargramostim -vaccines Talk to your doctor or health care professional before taking any of these medicines: -acetaminophen -aspirin -ibuprofen -ketoprofen -naproxen This list may not describe all possible interactions. Give your health care Tab Rylee a list of all the medicines, herbs, non-prescription drugs, or dietary supplements you use. Also tell them if you smoke, drink alcohol, or use illegal drugs. Some items may interact with your medicine. What should I watch for while using this medicine? Your condition will be monitored carefully while you are receiving this medicine. You will need important blood work done while you are taking this medicine. This drug may make you feel generally unwell. This is not uncommon, as chemotherapy can affect healthy cells as well as cancer cells. Report any side effects. Continue your course of treatment even though you feel ill unless your doctor tells you to stop. In some cases, you may be given additional medicines to help with side effects. Follow all directions for their use. Call your doctor or health care professional for advice if you get a fever, chills or sore throat, or other symptoms of a cold or flu. Do not treat yourself. This drug decreases your body's ability to fight infections. Try to avoid being around people who are sick. This medicine may increase your risk to bruise or bleed. Call your doctor or health care professional if you notice any unusual bleeding. Be careful brushing and flossing your teeth or using a toothpick because you may get an infection or bleed more easily. If you have any dental work done,  tell your dentist you are receiving this medicine. Avoid taking products that contain aspirin, acetaminophen, ibuprofen, naproxen, or ketoprofen unless instructed by your doctor. These medicines may hide a fever. This medicine contains an alcohol in the product. You may get drowsy or dizzy. Do not drive, use machinery, or do anything that needs mental alertness until you know how this medicine affects you. Do not stand or sit up quickly, especially if you are an older patient. This reduces the risk of dizzy or fainting spells. Avoid alcoholic drinks Do not become pregnant while taking this medicine. Women should inform their doctor if they wish to become pregnant or think they might be pregnant. There is a potential for serious side effects to an unborn child. Talk to your health care professional or pharmacist for more information. Do not breast-feed an infant while taking this medicine. What side effects may I notice from receiving this medicine? Side effects that you should report to your doctor or health care professional as soon as possible: -allergic reactions like skin rash, itching or hives, swelling of the face, lips, or tongue -low blood counts - This drug may decrease the number of white blood cells, red blood cells and platelets. You may be at increased risk for  infections and bleeding. -signs of infection - fever or chills, cough, sore throat, pain or difficulty passing urine -signs of decreased platelets or bleeding - bruising, pinpoint red spots on the skin, black, tarry stools, nosebleeds -signs of decreased red blood cells - unusually weak or tired, fainting spells, lightheadedness -breathing problems -fast or irregular heartbeat -low blood pressure -mouth sores -nausea and vomiting -pain, swelling, redness or irritation at the injection site -pain, tingling, numbness in the hands or feet -swelling of the ankle, feet, hands -weight gain Side effects that usually do not require  medical attention (report to your prescriber or health care professional if they continue or are bothersome): -bone pain -complete hair loss including hair on your head, underarms, pubic hair, eyebrows, and eyelashes -diarrhea -excessive tearing -changes in the color of fingernails -loosening of the fingernails -nausea -muscle pain -red flush to skin -sweating -weak or tired This list may not describe all possible side effects. Call your doctor for medical advice about side effects. You may report side effects to FDA at 1-800-FDA-1088. Where should I keep my medicine? This drug is given in a hospital or clinic and will not be stored at home. NOTE: This sheet is a summary. It may not cover all possible information. If you have questions about this medicine, talk to your doctor, pharmacist, or health care Isacc Turney.  2015, [employer]/Gold Standard. (2012-12-20 22:21:02)

## 2014-11-12 NOTE — Progress Notes (Signed)
[facility] [phone]) [phone]   Fax:([phone]) [facility] [address] [employer] [address] [place] [address]  DIAGNOSIS: Stage IV (T2, N2, M1a) non-small cell lung cancer, squamous cell carcinoma presented with central left upper lobe lung mass with associated postobstructive atelectasis as well as ipsilateral and subcarinal lymphadenopathy as well as pleural tumor spread diagnosed in October 2015.  PRIOR THERAPY:  1) Systemic chemotherapy with carboplatin for AUC of 5 on day 1 and Abraxane 100 MG/M2 on days 1, 8 and 15 every 3 weeks. Status post 6 cycles.  2) Nivolumab per research protocol BMS 370 group B, arm A, given every 2 weeks. Status post 7 cycles. Last dose was given 08/13/2014 discontinued on 08/28/2014 secondary to disease progression.  CURRENT THERAPY: Systemic chemotherapy with docetaxel 75 MG/M2 and Cyramza 10 MG/KG every 3 weeks. First dose 09/10/2014. Status post 3 cycles.  INTERVAL HISTORY: Tammie Clay 69 y.o. female returns to the clinic today for follow-up visit accompanied by her significant other. The patient is currently on treatment with docetaxel and Cyramza status post 3 cycles and tolerating her treatment fairly well except for fatigue. She denied having any worsening peripheral neuropathy. She denied having any weight loss or night sweats. The patient denied having any chest pain, shortness of breath, cough or hemoptysis. She has no bleeding issues. She has no nausea or vomiting and no fever or chills. The patient had repeat CT scan of the chest, abdomen and pelvis performed recently and she is here for evaluation and discussion of her scan results.  MEDICAL HISTORY: Past Medical History  Diagnosis Date  . Hypertension   . COPD (chronic obstructive pulmonary disease) ([place])   . Hyperlipidemia   . GERD (gastroesophageal reflux disease)   . Osteopenia   . Insomnia   . Mediastinal lymphadenopathy  11/19/13    PER CT  . Multiple lung nodules on CT 11/19/13    LEFT LUNG BASE  . Lung mass 11/19/13    RUL PER CT  . Coronary artery calcification seen on CAT scan 11/21/2013  . COPD (chronic obstructive pulmonary disease) ([place]) 11/21/2013  . Pneumonia     hx of  . Anxiety   . stage iv sq cell lung ca dx'd 11/2013    Recist     ALLERGIES:  has No Known Allergies.  MEDICATIONS:  Current Outpatient Prescriptions  Medication Sig Dispense Refill  . acetaminophen (TYLENOL) 325 MG tablet Take 650 mg by mouth daily as needed for moderate pain or headache. Take 2 tablets every morning    . albuterol (PROVENTIL HFA;VENTOLIN HFA) 108 (90 BASE) MCG/ACT inhaler Inhale 2 puffs into the lungs every 6 (six) hours as needed for wheezing.    . calcium carbonate (TUMS - DOSED IN MG ELEMENTAL CALCIUM) 500 MG chewable tablet Chew 1 tablet by mouth daily as needed for indigestion or heartburn.     . dexamethasone (DECADRON) 4 MG tablet 2 tablets by mouth twice a day, the day before, day of and day after the chemotherapy every 3 weeks 40 tablet 1  . docusate sodium (COLACE) 100 MG capsule Take 100 mg by mouth 2 (two) times daily as needed for moderate constipation.     . DULoxetine (CYMBALTA) 30 MG capsule TAKE ONE CAPSULE BY MOUTH EVERY DAY 30 capsule 2  . HYDROcodone-acetaminophen (NORCO) 10-325 MG per tablet Take 1 tablet by mouth every 6 (six) hours as needed for moderate pain or  severe pain.     Marland Kitchen lidocaine-prilocaine (EMLA) cream Apply 1 application topically as needed. 30 g 0  . lisinopril-hydrochlorothiazide (PRINZIDE,ZESTORETIC) 20-12.5 MG per tablet Take 2 tablets by mouth every morning.     . loratadine (CLARITIN) 10 MG tablet Take 10 mg by mouth daily.    . methimazole (TAPAZOLE) 10 MG tablet Take 10 mg by mouth 2 (two) times daily.    . metoprolol succinate (TOPROL-XL) 50 MG 24 hr tablet Take 50 mg by mouth every morning. Take with or immediately following a meal.    . Multiple Vitamin  (MULTIVITAMIN WITH MINERALS) TABS Take 1 tablet by mouth daily.    . polyethylene glycol (MIRALAX / GLYCOLAX) packet Take 17 g by mouth daily as needed for moderate constipation or severe constipation.     . prochlorperazine (COMPAZINE) 10 MG tablet Take 1 tablet (10 mg total) by mouth every 6 (six) hours as needed for nausea or vomiting. 30 tablet 1  . ranitidine (ZANTAC) 150 MG tablet Take 150 mg by mouth 2 (two) times daily as needed for heartburn.     . SYMBICORT 80-4.5 MCG/ACT inhaler Inhale 2 puffs into the lungs 2 (two) times daily.   3  . temazepam (RESTORIL) 30 MG capsule Take 1 capsule (30 mg total) by mouth at bedtime as needed for sleep. 30 capsule 0  . diphenoxylate-atropine (LOMOTIL) 2.5-0.025 MG per tablet TAKE 2 TABLETS 4 TIMES A DAY AS NEEDED FOR DIARRHEA (Patient not taking: Reported on 11/12/2014) 30 tablet 0  . loperamide (IMODIUM) 2 MG capsule Take by mouth as needed for diarrhea or loose stools.     No current facility-administered medications for this visit.    SURGICAL HISTORY:  Past Surgical History  Procedure Laterality Date  .  c section x 2    . Urethral cyst removed    . Tonsillectomy    . Appendectomy    . Colonoscopy with propofol N/A 07/17/2012    Procedure: COLONOSCOPY WITH PROPOFOL;  Surgeon: Garlan Fair, MD;  Location: [facility];  Service: Endoscopy;  Laterality: N/A;  . Esophagogastroduodenoscopy (egd) with propofol N/A 07/17/2012    Procedure: ESOPHAGOGASTRODUODENOSCOPY (EGD) WITH PROPOFOL;  Surgeon: Garlan Fair, MD;  Location: [facility];  Service: Endoscopy;  Laterality: N/A;  . Video bronchoscopy with endobronchial ultrasound N/A 12/03/2013    Procedure: VIDEO BRONCHOSCOPY WITH ENDOBRONCHIAL ULTRASOUND WITH BIOPSIES;  Surgeon: Grace Isaac, MD;  Location: [facility];  Service: Thoracic;  Laterality: N/A;  . Portacath placement Left 12/10/2013    Procedure: INSERTION PORT-A-CATH;  Surgeon: Grace Isaac, MD;  Location: [facility];  Service:  Thoracic;  Laterality: Left;    REVIEW OF SYSTEMS:  Constitutional: positive for fatigue Eyes: negative Ears, nose, mouth, throat, and face: negative Respiratory: negative Cardiovascular: negative Gastrointestinal: negative Genitourinary:negative Integument/breast: negative Hematologic/lymphatic: negative Musculoskeletal:negative Neurological: positive for paresthesia Behavioral/Psych: positive for anxiety Endocrine: negative Allergic/Immunologic: negative   PHYSICAL EXAMINATION: General appearance: alert, cooperative, fatigued and no distress Head: Normocephalic, without obvious abnormality, atraumatic Neck: no adenopathy, no JVD, supple, symmetrical, trachea midline and thyroid not enlarged, symmetric, no tenderness/mass/nodules Lymph nodes: Cervical, supraclavicular, and axillary nodes normal. Resp: clear to auscultation bilaterally Back: symmetric, no curvature. ROM normal. No CVA tenderness. Cardio: regular rate and rhythm, S1, S2 normal, no murmur, click, rub or gallop GI: soft, non-tender; bowel sounds normal; no masses,  no organomegaly Genitalia: defer exam Extremities: extremities normal, atraumatic, no cyanosis or edema Neurologic: Alert and oriented X 3, normal strength and tone.  Normal symmetric reflexes. Normal coordination and gait  ECOG PERFORMANCE STATUS: 1 - Symptomatic but completely ambulatory  There were no vitals taken for this visit.  LABORATORY DATA: Lab Results  Component Value Date   WBC 10.0 11/12/2014   HGB 11.4* 11/12/2014   HCT 34.1* 11/12/2014   MCV 91.3 11/12/2014   PLT 278 11/12/2014      Chemistry      Component Value Date/Time   NA 139 11/12/2014 0941   NA 135 07/19/2014 1607   K 4.1 11/12/2014 0941   K 3.4* 07/19/2014 1607   CL 99* 07/19/2014 1607   CO2 29 11/12/2014 0941   CO2 28 07/19/2014 1607   BUN 22.0 11/12/2014 0941   BUN 21* 07/19/2014 1607   CREATININE 0.9 11/12/2014 0941   CREATININE 1.01* 07/19/2014 1607    CREATININE 0.80 11/22/2013 1054      Component Value Date/Time   CALCIUM 9.7 11/12/2014 0941   CALCIUM 8.7* 07/19/2014 1607   ALKPHOS 83 11/12/2014 0941   ALKPHOS 77 12/10/2013 0637   AST 16 11/12/2014 0941   AST 16 12/10/2013 0637   ALT 19 11/12/2014 0941   ALT 16 12/10/2013 0637   BILITOT <0.30 11/12/2014 0941   BILITOT 0.4 12/10/2013 0637       RADIOGRAPHIC STUDIES: Ct Chest W Contrast  11/10/2014   CLINICAL DATA:  Subsequent encounter for stage IV squamous cell carcinoma the left lung.  EXAM: CT CHEST, ABDOMEN, AND PELVIS WITH CONTRAST  TECHNIQUE: Multidetector CT imaging of the chest, abdomen and pelvis was performed following the standard protocol during bolus administration of intravenous contrast.  CONTRAST:  170m OMNIPAQUE IOHEXOL 300 MG/ML  SOLN  COMPARISON:  08/28/2014.  FINDINGS: CT CHEST FINDINGS  Mediastinum/Lymph Nodes: The tip of the left-sided Port-A-Cath is positioned in the distal SVC. There is no axillary lymphadenopathy.  Indications were not saved on the prior study, so lesions used for comparative purposes today were remeasured by me on the prior exam. High right paratracheal lymph node which is 11 mm in short axis today was 12 mm previously. 18 mm short axis precarinal lymph node on today's study was 17 mm previously. 18 mm subcarinal lymph node on today's exam was 24 mm previously. On today's exam, there is an 11 mm short axis lymph node adjacent to the distal esophagus. This lymph node measured 15 mm in short axis previously.  The heart size is normal. No pericardial effusion. Coronary artery calcification is noted.  Lungs/Pleura: Amorphous prevascular lesion along the left mediastinum measures 5.1 x 3.3 cm today compared to 5.1 x 4.5 cm previously.  6 mm nodule in the posterIor right costophrenic sulcus (image 46 series 4) was 3 mm previously. 6 mm nodule on the same image along the dome of the right hemidiaphragm was 1 mm previously. 6 mm left lower lobe nodule seen  posteriorly on image 31 today was 5 mm previously. Two other left lower lobe nodules seen on the same image are new in the interval.  No pulmonary edema or pleural effusion.  Musculoskeletal: Bone windows reveal no worrisome lytic or sclerotic osseous lesions.  CT ABDOMEN PELVIS FINDINGS  Hepatobiliary: Scattered tiny hypo attenuating lesions in the liver parenchyma are stable in the interval and likely represent cysts. There is no evidence for gallstones, gallbladder wall thickening, or pericholecystic fluid. No intrahepatic or extrahepatic biliary dilation.  Pancreas: No focal mass lesion. No dilatation of the main duct. No intraparenchymal cyst. No peripancreatic edema.  Spleen: No splenomegaly. No  focal mass lesion.  Adrenals/Urinary Tract: No adrenal nodule or mass. Right kidney malrotated but otherwise unremarkable. Several punctate nonobstructing stones evident in the left kidney are probable, but there is associated vascular calcification which could account for some of this appearance. No hydronephrosis. No evidence for hydroureter The urinary bladder appears normal for the degree of distention.  Stomach/Bowel: Stomach is nondistended. No gastric wall thickening. No evidence of outlet obstruction. Duodenum is normally positioned as is the ligament of Treitz. No small bowel wall thickening. No small bowel dilatation. The terminal ileum is normal. The appendix is not visualized, but there is no edema or inflammation in the region of the cecum. No gross colonic mass. No colonic wall thickening. No substantial diverticular change.  Vascular/Lymphatic: Fusiform abdominal aortic aneurysm measures up to 3.1 cm in diameter. Prominent gonadal venous anatomy in the left anatomic pelvis. There is evidence of nonocclusive thrombus in some of these gonadal veins, unchanged in the interval. There is no gastrohepatic or hepatoduodenal ligament lymphadenopathy. No intraperitoneal or retroperitoneal lymphadenopy. No pelvic  sidewall lymphadenopathy.  Reproductive: Fibroid changes noted in the uterus. Stable appearance of a 4.6 cm left adnexal cystic lesion. No right adnexal mass.  Other: No intraperitoneal free fluid.  Musculoskeletal: Bone windows reveal no worrisome lytic or sclerotic osseous lesions.  IMPRESSION: 1. Amorphous lesion in the medial left upper lobe measure slightly smaller in the interval. 2. Mediastinal lymphadenopathy overall measures decreased in the interval. 3. As before, small bilateral pulmonary nodules have a basilar predominance. Nodules appear slightly increased in the interval. Some nodules are new in the interval. 4. 3.1 cm abdominal aortic aneurysm, as before. 5. 4.6 cm left adnexal cystic lesion is unchanged, likely benign.   Electronically Signed   By: Misty Stanley M.D.   On: 11/10/2014 11:31   Ct Abdomen Pelvis W Contrast  11/10/2014   CLINICAL DATA:  Subsequent encounter for stage IV squamous cell carcinoma the left lung.  EXAM: CT CHEST, ABDOMEN, AND PELVIS WITH CONTRAST  TECHNIQUE: Multidetector CT imaging of the chest, abdomen and pelvis was performed following the standard protocol during bolus administration of intravenous contrast.  CONTRAST:  127m OMNIPAQUE IOHEXOL 300 MG/ML  SOLN  COMPARISON:  08/28/2014.  FINDINGS: CT CHEST FINDINGS  Mediastinum/Lymph Nodes: The tip of the left-sided Port-A-Cath is positioned in the distal SVC. There is no axillary lymphadenopathy.  Indications were not saved on the prior study, so lesions used for comparative purposes today were remeasured by me on the prior exam. High right paratracheal lymph node which is 11 mm in short axis today was 12 mm previously. 18 mm short axis precarinal lymph node on today's study was 17 mm previously. 18 mm subcarinal lymph node on today's exam was 24 mm previously. On today's exam, there is an 11 mm short axis lymph node adjacent to the distal esophagus. This lymph node measured 15 mm in short axis previously.  The heart  size is normal. No pericardial effusion. Coronary artery calcification is noted.  Lungs/Pleura: Amorphous prevascular lesion along the left mediastinum measures 5.1 x 3.3 cm today compared to 5.1 x 4.5 cm previously.  6 mm nodule in the posterIor right costophrenic sulcus (image 46 series 4) was 3 mm previously. 6 mm nodule on the same image along the dome of the right hemidiaphragm was 1 mm previously. 6 mm left lower lobe nodule seen posteriorly on image 31 today was 5 mm previously. Two other left lower lobe nodules seen on the same image are new  in the interval.  No pulmonary edema or pleural effusion.  Musculoskeletal: Bone windows reveal no worrisome lytic or sclerotic osseous lesions.  CT ABDOMEN PELVIS FINDINGS  Hepatobiliary: Scattered tiny hypo attenuating lesions in the liver parenchyma are stable in the interval and likely represent cysts. There is no evidence for gallstones, gallbladder wall thickening, or pericholecystic fluid. No intrahepatic or extrahepatic biliary dilation.  Pancreas: No focal mass lesion. No dilatation of the main duct. No intraparenchymal cyst. No peripancreatic edema.  Spleen: No splenomegaly. No focal mass lesion.  Adrenals/Urinary Tract: No adrenal nodule or mass. Right kidney malrotated but otherwise unremarkable. Several punctate nonobstructing stones evident in the left kidney are probable, but there is associated vascular calcification which could account for some of this appearance. No hydronephrosis. No evidence for hydroureter The urinary bladder appears normal for the degree of distention.  Stomach/Bowel: Stomach is nondistended. No gastric wall thickening. No evidence of outlet obstruction. Duodenum is normally positioned as is the ligament of Treitz. No small bowel wall thickening. No small bowel dilatation. The terminal ileum is normal. The appendix is not visualized, but there is no edema or inflammation in the region of the cecum. No gross colonic mass. No colonic  wall thickening. No substantial diverticular change.  Vascular/Lymphatic: Fusiform abdominal aortic aneurysm measures up to 3.1 cm in diameter. Prominent gonadal venous anatomy in the left anatomic pelvis. There is evidence of nonocclusive thrombus in some of these gonadal veins, unchanged in the interval. There is no gastrohepatic or hepatoduodenal ligament lymphadenopathy. No intraperitoneal or retroperitoneal lymphadenopy. No pelvic sidewall lymphadenopathy.  Reproductive: Fibroid changes noted in the uterus. Stable appearance of a 4.6 cm left adnexal cystic lesion. No right adnexal mass.  Other: No intraperitoneal free fluid.  Musculoskeletal: Bone windows reveal no worrisome lytic or sclerotic osseous lesions.  IMPRESSION: 1. Amorphous lesion in the medial left upper lobe measure slightly smaller in the interval. 2. Mediastinal lymphadenopathy overall measures decreased in the interval. 3. As before, small bilateral pulmonary nodules have a basilar predominance. Nodules appear slightly increased in the interval. Some nodules are new in the interval. 4. 3.1 cm abdominal aortic aneurysm, as before. 5. 4.6 cm left adnexal cystic lesion is unchanged, likely benign.   Electronically Signed   By: Misty Stanley M.D.   On: 11/10/2014 11:31    ASSESSMENT AND PLAN: This is a very pleasant 69 years old white female recently diagnosed with:  1) Stage IV non-small cell lung cancer, squamous cell carcinoma completed systemic chemotherapy with carboplatin and Abraxane status post 6 cycles.  She is currently on treatment with immunotherapy according to the BMS checkmate 370 clinical trial with Nivolumab.she status post 7 cycles and tolerating her last treatment fairly well with no significant diarrhea. Her treatment was also discontinued secondary to disease progression. The patient is currently undergoing systemic chemotherapy with docetaxel and surrounds is status post 3 cycles and tolerating her treatment well except  for fatigue. The recent CT scan of the chest, abdomen and pelvis showed mixed response with improvement in the medial left upper lobe lung mass as well as mediastinal lymphadenopathy but there was a small bilateral pulmonary nodules some of them are new. I discussed the scan results and showed the images to the patient and her husband. I recommended for her to continue her current treatment with docetaxel and surrounds and we will continue to monitor the small pulmonary nodules closed the on the upcoming scan. The patient would come back for follow-up visit in 3 weeks  for reevaluation before starting cycle #5. For insomnia, she was giving a refill of Restoril.  The patient was advised to call immediately if she has any concerning symptoms in the interval.  The patient voices understanding of current disease status and treatment options and is in agreement with the current care plan.  All questions were answered. The patient knows to call the clinic with any problems, questions or concerns. We can certainly see the patient much sooner if necessary.  Disclaimer: This note was dictated with voice recognition software. Similar sounding words can inadvertently be transcribed and may not be corrected upon review.

## 2014-11-13 ENCOUNTER — Other Ambulatory Visit: Payer: Self-pay | Admitting: Internal Medicine

## 2014-11-14 ENCOUNTER — Ambulatory Visit (HOSPITAL_BASED_OUTPATIENT_CLINIC_OR_DEPARTMENT_OTHER): Payer: Medicare Other

## 2014-11-14 VITALS — BP 142/82 | HR 94 | Temp 98.2°F | Resp 20

## 2014-11-14 DIAGNOSIS — C3412 Malignant neoplasm of upper lobe, left bronchus or lung: Secondary | ICD-10-CM | POA: Diagnosis not present

## 2014-11-14 DIAGNOSIS — Z5189 Encounter for other specified aftercare: Secondary | ICD-10-CM

## 2014-11-14 DIAGNOSIS — C3492 Malignant neoplasm of unspecified part of left bronchus or lung: Secondary | ICD-10-CM

## 2014-11-14 MED ORDER — PEGFILGRASTIM INJECTION 6 MG/0.6ML ~~LOC~~
6.0000 mg | PREFILLED_SYRINGE | Freq: Once | SUBCUTANEOUS | Status: AC
  Administered 2014-11-14: 6 mg via SUBCUTANEOUS
  Filled 2014-11-14: qty 0.6

## 2014-11-14 NOTE — Patient Instructions (Signed)
Pegfilgrastim injection What is this medicine? PEGFILGRASTIM (peg fil GRA stim) is a long-acting granulocyte colony-stimulating factor that stimulates the growth of neutrophils, a type of white blood cell important in the body's fight against infection. It is used to reduce the incidence of fever and infection in patients with certain types of cancer who are receiving chemotherapy that affects the bone marrow. This medicine may be used for other purposes; ask your health care provider or pharmacist if you have questions. COMMON BRAND NAME(S): Neulasta What should I tell my health care provider before I take this medicine? They need to know if you have any of these conditions: -latex allergy -ongoing radiation therapy -sickle cell disease -skin reactions to acrylic adhesives (On-Body Injector only) -an unusual or allergic reaction to pegfilgrastim, filgrastim, other medicines, foods, dyes, or preservatives -pregnant or trying to get pregnant -breast-feeding How should I use this medicine? This medicine is for injection under the skin. If you get this medicine at home, you will be taught how to prepare and give the pre-filled syringe or how to use the On-body Injector. Refer to the patient Instructions for Use for detailed instructions. Use exactly as directed. Take your medicine at regular intervals. Do not take your medicine more often than directed. It is important that you put your used needles and syringes in a special sharps container. Do not put them in a trash can. If you do not have a sharps container, call your pharmacist or healthcare provider to get one. Talk to your pediatrician regarding the use of this medicine in children. Special care may be needed. Overdosage: If you think you have taken too much of this medicine contact a poison control center or emergency room at once. NOTE: This medicine is only for you. Do not share this medicine with others. What if I miss a dose? It is  important not to miss your dose. Call your doctor or health care professional if you miss your dose. If you miss a dose due to an On-body Injector failure or leakage, a new dose should be administered as soon as possible using a single prefilled syringe for manual use. What may interact with this medicine? Interactions have not been studied. Give your health care provider a list of all the medicines, herbs, non-prescription drugs, or dietary supplements you use. Also tell them if you smoke, drink alcohol, or use illegal drugs. Some items may interact with your medicine. This list may not describe all possible interactions. Give your health care provider a list of all the medicines, herbs, non-prescription drugs, or dietary supplements you use. Also tell them if you smoke, drink alcohol, or use illegal drugs. Some items may interact with your medicine. What should I watch for while using this medicine? You may need blood work done while you are taking this medicine. If you are going to need a MRI, CT scan, or other procedure, tell your doctor that you are using this medicine (On-Body Injector only). What side effects may I notice from receiving this medicine? Side effects that you should report to your doctor or health care professional as soon as possible: -allergic reactions like skin rash, itching or hives, swelling of the face, lips, or tongue -dizziness -fever -pain, redness, or irritation at site where injected -pinpoint red spots on the skin -shortness of breath or breathing problems -stomach or side pain, or pain at the shoulder -swelling -tiredness -trouble passing urine Side effects that usually do not require medical attention (report to your doctor   or health care professional if they continue or are bothersome): -bone pain -muscle pain This list may not describe all possible side effects. Call your doctor for medical advice about side effects. You may report side effects to FDA at  1-800-FDA-1088. Where should I keep my medicine? Keep out of the reach of children. Store pre-filled syringes in a refrigerator between 2 and 8 degrees C (36 and 46 degrees F). Do not freeze. Keep in carton to protect from light. Throw away this medicine if it is left out of the refrigerator for more than 48 hours. Throw away any unused medicine after the expiration date. NOTE: This sheet is a summary. It may not cover all possible information. If you have questions about this medicine, talk to your doctor, pharmacist, or health care provider.  2015, Elsevier/Gold Standard. (2013-04-25 16:14:05)  

## 2014-11-19 ENCOUNTER — Telehealth: Payer: Self-pay | Admitting: *Deleted

## 2014-11-19 ENCOUNTER — Other Ambulatory Visit (HOSPITAL_BASED_OUTPATIENT_CLINIC_OR_DEPARTMENT_OTHER): Payer: Medicare Other

## 2014-11-19 DIAGNOSIS — C3492 Malignant neoplasm of unspecified part of left bronchus or lung: Secondary | ICD-10-CM

## 2014-11-19 DIAGNOSIS — C3412 Malignant neoplasm of upper lobe, left bronchus or lung: Secondary | ICD-10-CM | POA: Diagnosis not present

## 2014-11-19 LAB — CBC WITH DIFFERENTIAL/PLATELET
BASO%: 0.8 % (ref 0.0–2.0)
Basophils Absolute: 0.1 10*3/uL (ref 0.0–0.1)
EOS ABS: 0.1 10*3/uL (ref 0.0–0.5)
EOS%: 1 % (ref 0.0–7.0)
HEMATOCRIT: 33.1 % — AB (ref 34.8–46.6)
HEMOGLOBIN: 10.9 g/dL — AB (ref 11.6–15.9)
LYMPH#: 1.3 10*3/uL (ref 0.9–3.3)
LYMPH%: 21.8 % (ref 14.0–49.7)
MCH: 30.6 pg (ref 25.1–34.0)
MCHC: 32.9 g/dL (ref 31.5–36.0)
MCV: 93 fL (ref 79.5–101.0)
MONO#: 1.1 10*3/uL — AB (ref 0.1–0.9)
MONO%: 18.3 % — AB (ref 0.0–14.0)
NEUT%: 58.1 % (ref 38.4–76.8)
NEUTROS ABS: 3.5 10*3/uL (ref 1.5–6.5)
PLATELETS: 168 10*3/uL (ref 145–400)
RBC: 3.56 10*6/uL — ABNORMAL LOW (ref 3.70–5.45)
RDW: 19.7 % — AB (ref 11.2–14.5)
WBC: 6 10*3/uL (ref 3.9–10.3)

## 2014-11-19 LAB — COMPREHENSIVE METABOLIC PANEL (CC13)
ALBUMIN: 3.5 g/dL (ref 3.5–5.0)
ALK PHOS: 100 U/L (ref 40–150)
ALT: 20 U/L (ref 0–55)
AST: 19 U/L (ref 5–34)
Anion Gap: 10 mEq/L (ref 3–11)
BILIRUBIN TOTAL: 0.39 mg/dL (ref 0.20–1.20)
BUN: 22.1 mg/dL (ref 7.0–26.0)
CALCIUM: 9.2 mg/dL (ref 8.4–10.4)
CO2: 28 mEq/L (ref 22–29)
Chloride: 100 mEq/L (ref 98–109)
Creatinine: 1 mg/dL (ref 0.6–1.1)
EGFR: 56 mL/min/{1.73_m2} — AB (ref >90)
GLUCOSE: 108 mg/dL (ref 70–140)
Potassium: 3.6 mEq/L (ref 3.5–5.1)
SODIUM: 137 meq/L (ref 136–145)
TOTAL PROTEIN: 6.4 g/dL (ref 6.4–8.3)

## 2014-11-19 MED ORDER — DIPHENOXYLATE-ATROPINE 2.5-0.025 MG PO TABS: ORAL_TABLET | ORAL | Status: DC

## 2014-11-19 NOTE — Telephone Encounter (Signed)
TC from patient requesting refill on her Lomotil.  This was called in to [facility] on [address].

## 2014-11-26 ENCOUNTER — Other Ambulatory Visit (HOSPITAL_BASED_OUTPATIENT_CLINIC_OR_DEPARTMENT_OTHER): Payer: Medicare Other

## 2014-11-26 DIAGNOSIS — C3412 Malignant neoplasm of upper lobe, left bronchus or lung: Secondary | ICD-10-CM

## 2014-11-26 DIAGNOSIS — C3492 Malignant neoplasm of unspecified part of left bronchus or lung: Secondary | ICD-10-CM

## 2014-11-26 LAB — COMPREHENSIVE METABOLIC PANEL (CC13)
ALT: 21 U/L (ref 0–55)
ANION GAP: 9 meq/L (ref 3–11)
AST: 23 U/L (ref 5–34)
Albumin: 3.5 g/dL (ref 3.5–5.0)
Alkaline Phosphatase: 123 U/L (ref 40–150)
BUN: 14.7 mg/dL (ref 7.0–26.0)
CALCIUM: 9 mg/dL (ref 8.4–10.4)
CHLORIDE: 101 meq/L (ref 98–109)
CO2: 29 meq/L (ref 22–29)
Creatinine: 0.9 mg/dL (ref 0.6–1.1)
EGFR: 62 mL/min/{1.73_m2} — AB (ref >90)
Glucose: 119 mg/dl (ref 70–140)
Potassium: 3.3 mEq/L — ABNORMAL LOW (ref 3.5–5.1)
SODIUM: 140 meq/L (ref 136–145)
Total Protein: 6.5 g/dL (ref 6.4–8.3)

## 2014-11-26 LAB — CBC WITH DIFFERENTIAL/PLATELET
BASO%: 0.4 % (ref 0.0–2.0)
Basophils Absolute: 0.1 10*3/uL (ref 0.0–0.1)
EOS%: 0 % (ref 0.0–7.0)
Eosinophils Absolute: 0 10*3/uL (ref 0.0–0.5)
HCT: 35 % (ref 34.8–46.6)
HGB: 11.5 g/dL — ABNORMAL LOW (ref 11.6–15.9)
LYMPH%: 9.6 % — AB (ref 14.0–49.7)
MCH: 30.5 pg (ref 25.1–34.0)
MCHC: 32.7 g/dL (ref 31.5–36.0)
MCV: 93.2 fL (ref 79.5–101.0)
MONO#: 0.6 10*3/uL (ref 0.1–0.9)
MONO%: 4.2 % (ref 0.0–14.0)
NEUT%: 85.8 % — AB (ref 38.4–76.8)
NEUTROS ABS: 11.9 10*3/uL — AB (ref 1.5–6.5)
Platelets: 213 10*3/uL (ref 145–400)
RBC: 3.75 10*6/uL (ref 3.70–5.45)
RDW: 22 % — ABNORMAL HIGH (ref 11.2–14.5)
WBC: 13.9 10*3/uL — AB (ref 3.9–10.3)
lymph#: 1.3 10*3/uL (ref 0.9–3.3)

## 2014-11-29 ENCOUNTER — Other Ambulatory Visit: Payer: Self-pay | Admitting: Internal Medicine

## 2014-12-02 ENCOUNTER — Other Ambulatory Visit: Payer: Self-pay | Admitting: Medical Oncology

## 2014-12-02 DIAGNOSIS — C3492 Malignant neoplasm of unspecified part of left bronchus or lung: Secondary | ICD-10-CM

## 2014-12-03 ENCOUNTER — Ambulatory Visit (HOSPITAL_BASED_OUTPATIENT_CLINIC_OR_DEPARTMENT_OTHER): Payer: Medicare Other | Admitting: Internal Medicine

## 2014-12-03 ENCOUNTER — Ambulatory Visit (HOSPITAL_BASED_OUTPATIENT_CLINIC_OR_DEPARTMENT_OTHER): Payer: Medicare Other

## 2014-12-03 ENCOUNTER — Encounter: Payer: Self-pay | Admitting: Internal Medicine

## 2014-12-03 ENCOUNTER — Other Ambulatory Visit (HOSPITAL_BASED_OUTPATIENT_CLINIC_OR_DEPARTMENT_OTHER): Payer: Medicare Other

## 2014-12-03 ENCOUNTER — Encounter: Payer: Self-pay | Admitting: Medical Oncology

## 2014-12-03 ENCOUNTER — Encounter: Payer: Self-pay | Admitting: Oncology

## 2014-12-03 ENCOUNTER — Telehealth: Payer: Self-pay | Admitting: Internal Medicine

## 2014-12-03 VITALS — BP 146/75 | HR 99 | Temp 98.3°F | Resp 18 | Ht 62.0 in | Wt 152.0 lb

## 2014-12-03 DIAGNOSIS — Z006 Encounter for examination for normal comparison and control in clinical research program: Secondary | ICD-10-CM

## 2014-12-03 DIAGNOSIS — Z5111 Encounter for antineoplastic chemotherapy: Secondary | ICD-10-CM

## 2014-12-03 DIAGNOSIS — T451X5A Adverse effect of antineoplastic and immunosuppressive drugs, initial encounter: Secondary | ICD-10-CM

## 2014-12-03 DIAGNOSIS — Z5112 Encounter for antineoplastic immunotherapy: Secondary | ICD-10-CM | POA: Diagnosis not present

## 2014-12-03 DIAGNOSIS — G620 Drug-induced polyneuropathy: Secondary | ICD-10-CM | POA: Diagnosis not present

## 2014-12-03 DIAGNOSIS — C3492 Malignant neoplasm of unspecified part of left bronchus or lung: Secondary | ICD-10-CM

## 2014-12-03 DIAGNOSIS — C3412 Malignant neoplasm of upper lobe, left bronchus or lung: Secondary | ICD-10-CM

## 2014-12-03 DIAGNOSIS — Z79899 Other long term (current) drug therapy: Secondary | ICD-10-CM | POA: Diagnosis not present

## 2014-12-03 HISTORY — DX: Encounter for antineoplastic chemotherapy: Z51.11

## 2014-12-03 LAB — COMPREHENSIVE METABOLIC PANEL (CC13)
ALBUMIN: 3.7 g/dL (ref 3.5–5.0)
ALK PHOS: 84 U/L (ref 40–150)
ALT: 21 U/L (ref 0–55)
AST: 20 U/L (ref 5–34)
Anion Gap: 10 mEq/L (ref 3–11)
BUN: 31.4 mg/dL — AB (ref 7.0–26.0)
CALCIUM: 9.8 mg/dL (ref 8.4–10.4)
CO2: 27 mEq/L (ref 22–29)
CREATININE: 1 mg/dL (ref 0.6–1.1)
Chloride: 102 mEq/L (ref 98–109)
EGFR: 55 mL/min/{1.73_m2} — ABNORMAL LOW (ref >90)
GLUCOSE: 130 mg/dL (ref 70–140)
Potassium: 4.2 mEq/L (ref 3.5–5.1)
SODIUM: 138 meq/L (ref 136–145)
Total Bilirubin: <0.3 mg/dL (ref 0.20–1.20)
Total Protein: 6.8 g/dL (ref 6.4–8.3)

## 2014-12-03 LAB — CBC WITH DIFFERENTIAL/PLATELET
BASO%: 0 % (ref 0.0–2.0)
Basophils Absolute: 0 10*3/uL (ref 0.0–0.1)
EOS ABS: 0 10*3/uL (ref 0.0–0.5)
EOS%: 0 % (ref 0.0–7.0)
HCT: 33.9 % — ABNORMAL LOW (ref 34.8–46.6)
HEMOGLOBIN: 11 g/dL — AB (ref 11.6–15.9)
LYMPH%: 9.9 % — AB (ref 14.0–49.7)
MCH: 31.1 pg (ref 25.1–34.0)
MCHC: 32.4 g/dL (ref 31.5–36.0)
MCV: 95.8 fL (ref 79.5–101.0)
MONO#: 0.7 10*3/uL (ref 0.1–0.9)
MONO%: 6 % (ref 0.0–14.0)
NEUT%: 84.1 % — ABNORMAL HIGH (ref 38.4–76.8)
NEUTROS ABS: 9.7 10*3/uL — AB (ref 1.5–6.5)
PLATELETS: 253 10*3/uL (ref 145–400)
RBC: 3.54 10*6/uL — AB (ref 3.70–5.45)
RDW: 20 % — AB (ref 11.2–14.5)
WBC: 11.6 10*3/uL — AB (ref 3.9–10.3)
lymph#: 1.2 10*3/uL (ref 0.9–3.3)

## 2014-12-03 LAB — T3, FREE: T3 FREE: 1.9 pg/mL — AB (ref 2.3–4.2)

## 2014-12-03 LAB — UA PROTEIN, DIPSTICK - CHCC: PROTEIN: NEGATIVE mg/dL

## 2014-12-03 LAB — T4, FREE: Free T4: 0.71 ng/dL — ABNORMAL LOW (ref 0.80–1.80)

## 2014-12-03 LAB — TSH CHCC: TSH: 7.749 m(IU)/L — ABNORMAL HIGH (ref 0.308–3.960)

## 2014-12-03 LAB — LACTATE DEHYDROGENASE (CC13): LDH: 402 U/L — AB (ref 125–245)

## 2014-12-03 MED ORDER — HEPARIN SOD (PORK) LOCK FLUSH 100 UNIT/ML IV SOLN
500.0000 [IU] | Freq: Once | INTRAVENOUS | Status: AC | PRN
  Administered 2014-12-03: 500 [IU]
  Filled 2014-12-03: qty 5

## 2014-12-03 MED ORDER — RAMUCIRUMAB CHEMO INJECTION 500 MG/50ML
10.0000 mg/kg | Freq: Once | INTRAVENOUS | Status: AC
  Administered 2014-12-03: 700 mg via INTRAVENOUS
  Filled 2014-12-03: qty 60

## 2014-12-03 MED ORDER — DOCETAXEL CHEMO INJECTION 160 MG/16ML
75.0000 mg/m2 | Freq: Once | INTRAVENOUS | Status: AC
  Administered 2014-12-03: 130 mg via INTRAVENOUS
  Filled 2014-12-03: qty 13

## 2014-12-03 MED ORDER — DIPHENHYDRAMINE HCL 50 MG/ML IJ SOLN
INTRAMUSCULAR | Status: AC
  Filled 2014-12-03: qty 1

## 2014-12-03 MED ORDER — ACETAMINOPHEN 325 MG PO TABS
ORAL_TABLET | ORAL | Status: AC
  Filled 2014-12-03: qty 2

## 2014-12-03 MED ORDER — SODIUM CHLORIDE 0.9 % IV SOLN
Freq: Once | INTRAVENOUS | Status: AC
  Administered 2014-12-03: 11:00:00 via INTRAVENOUS

## 2014-12-03 MED ORDER — ACETAMINOPHEN 325 MG PO TABS
650.0000 mg | ORAL_TABLET | Freq: Once | ORAL | Status: AC
  Administered 2014-12-03: 650 mg via ORAL

## 2014-12-03 MED ORDER — SODIUM CHLORIDE 0.9 % IJ SOLN
10.0000 mL | INTRAMUSCULAR | Status: DC | PRN
  Administered 2014-12-03: 10 mL
  Filled 2014-12-03: qty 10

## 2014-12-03 MED ORDER — DIPHENHYDRAMINE HCL 50 MG/ML IJ SOLN
50.0000 mg | Freq: Once | INTRAMUSCULAR | Status: AC
  Administered 2014-12-03: 50 mg via INTRAVENOUS

## 2014-12-03 MED ORDER — SODIUM CHLORIDE 0.9 % IV SOLN
Freq: Once | INTRAVENOUS | Status: AC
  Administered 2014-12-03: 13:00:00 via INTRAVENOUS
  Filled 2014-12-03: qty 4

## 2014-12-03 NOTE — Progress Notes (Signed)
12/03/14 - BMS [id number] - questionnaires (PRO's) - Patient into the cancer center for routine visit.  Patient given PRO's upon arrival to the cancer center.  Patient completed her PRO's [id number] first and then FACT-1, before any study procedures were performed.  I checked the PRO's for completeness.  I thanked the patient for her continued support in this clinical trial. Remer Macho 12/03/14 - 9:20 am

## 2014-12-03 NOTE — Progress Notes (Signed)
[place] [phone]   Fax:([phone]) [place] [employer] [address] [place] [address]  DIAGNOSIS: Stage IV (T2, N2, M1a) non-small cell lung cancer, squamous cell carcinoma presented with central left upper lobe lung mass with associated postobstructive atelectasis as well as ipsilateral and subcarinal lymphadenopathy as well as pleural tumor spread diagnosed in October 2015.  PRIOR THERAPY:  1) Systemic chemotherapy with carboplatin for AUC of 5 on day 1 and Abraxane 100 MG/M2 on days 1, 8 and 15 every 3 weeks. Status post 6 cycles.  2) Nivolumab per research protocol BMS 370 group B, arm A, given every 2 weeks. Status post 7 cycles. Last dose was given 08/13/2014 discontinued on 08/28/2014 secondary to disease progression.  CURRENT THERAPY: Systemic chemotherapy with docetaxel 75 MG/M2 and Cyramza 10 MG/KG every 3 weeks. First dose 09/10/2014. Status post 3 cycles.  INTERVAL HISTORY: Tammie Clay 69 y.o. female returns to the clinic today for follow-up visit accompanied by her cousin. The patient is currently on treatment with docetaxel and Cyramza status post 4 cycles and tolerating her treatment fairly well except for fatigue after the Neulasta injection. She continues to have peripheral neuropathy. She denied having any weight loss or night sweats. The patient denied having any chest pain, shortness of breath, cough or hemoptysis. She has no bleeding issues. She has no nausea or vomiting and no fever or chills. The patient is here today to start cycle #5 of her systemic chemotherapy.  MEDICAL HISTORY: Past Medical History  Diagnosis Date  . Hypertension   . COPD (chronic obstructive pulmonary disease) ([place])   . Hyperlipidemia   . GERD (gastroesophageal reflux disease)   . Osteopenia   . Insomnia   . Mediastinal lymphadenopathy 11/19/13    PER CT  . Multiple lung nodules on CT 11/19/13    LEFT  LUNG BASE  . Lung mass 11/19/13    RUL PER CT  . Coronary artery calcification seen on CAT scan 11/21/2013  . COPD (chronic obstructive pulmonary disease) ([place]) 11/21/2013  . Pneumonia     hx of  . Anxiety   . stage iv sq cell lung ca dx'd 11/2013    Recist     ALLERGIES:  has No Known Allergies.  MEDICATIONS:  Current Outpatient Prescriptions  Medication Sig Dispense Refill  . acetaminophen (TYLENOL) 325 MG tablet Take 650 mg by mouth daily as needed for moderate pain or headache. Take 2 tablets every morning    . albuterol (PROVENTIL HFA;VENTOLIN HFA) 108 (90 BASE) MCG/ACT inhaler Inhale 2 puffs into the lungs every 6 (six) hours as needed for wheezing.    . calcium carbonate (TUMS - DOSED IN MG ELEMENTAL CALCIUM) 500 MG chewable tablet Chew 1 tablet by mouth daily as needed for indigestion or heartburn.     . dexamethasone (DECADRON) 4 MG tablet 2 tablets by mouth twice a day, the day before, day of and day after the chemotherapy every 3 weeks 40 tablet 1  . diphenoxylate-atropine (LOMOTIL) 2.5-0.025 MG tablet TAKE 2 TABLETS 4 TIMES A DAY AS NEEDED FOR DIARRHEA 30 tablet 0  . docusate sodium (COLACE) 100 MG capsule Take 100 mg by mouth 2 (two) times daily as needed for moderate constipation.     . DULoxetine (CYMBALTA) 30 MG capsule TAKE ONE CAPSULE BY MOUTH EVERY DAY 30 capsule 2  . HYDROcodone-acetaminophen (NORCO) 10-325 MG per tablet Take 1 tablet by mouth  every 6 (six) hours as needed for moderate pain or severe pain.     Marland Kitchen lidocaine-prilocaine (EMLA) cream Apply 1 application topically as needed. 30 g 0  . lisinopril-hydrochlorothiazide (PRINZIDE,ZESTORETIC) 20-12.5 MG per tablet Take 2 tablets by mouth every morning.     . loperamide (IMODIUM) 2 MG capsule Take by mouth as needed for diarrhea or loose stools.    Marland Kitchen loratadine (CLARITIN) 10 MG tablet Take 10 mg by mouth daily.    . methimazole (TAPAZOLE) 10 MG tablet Take 10 mg by mouth 2 (two) times daily.    . metoprolol  succinate (TOPROL-XL) 50 MG 24 hr tablet Take 50 mg by mouth every morning. Take with or immediately following a meal.    . Multiple Vitamin (MULTIVITAMIN WITH MINERALS) TABS Take 1 tablet by mouth daily.    . polyethylene glycol (MIRALAX / GLYCOLAX) packet Take 17 g by mouth daily as needed for moderate constipation or severe constipation.     . prochlorperazine (COMPAZINE) 10 MG tablet Take 1 tablet (10 mg total) by mouth every 6 (six) hours as needed for nausea or vomiting. 30 tablet 1  . ranitidine (ZANTAC) 150 MG tablet Take 150 mg by mouth 2 (two) times daily as needed for heartburn.     . SYMBICORT 80-4.5 MCG/ACT inhaler Inhale 2 puffs into the lungs 2 (two) times daily.   3  . temazepam (RESTORIL) 30 MG capsule Take 1 capsule (30 mg total) by mouth at bedtime as needed for sleep. 30 capsule 0   No current facility-administered medications for this visit.    SURGICAL HISTORY:  Past Surgical History  Procedure Laterality Date  .  c section x 2    . Urethral cyst removed    . Tonsillectomy    . Appendectomy    . Colonoscopy with propofol N/A 07/17/2012    Procedure: COLONOSCOPY WITH PROPOFOL;  Surgeon: Garlan Fair, MD;  Location: [facility];  Service: Endoscopy;  Laterality: N/A;  . Esophagogastroduodenoscopy (egd) with propofol N/A 07/17/2012    Procedure: ESOPHAGOGASTRODUODENOSCOPY (EGD) WITH PROPOFOL;  Surgeon: Garlan Fair, MD;  Location: [facility];  Service: Endoscopy;  Laterality: N/A;  . Video bronchoscopy with endobronchial ultrasound N/A 12/03/2013    Procedure: VIDEO BRONCHOSCOPY WITH ENDOBRONCHIAL ULTRASOUND WITH BIOPSIES;  Surgeon: Grace Isaac, MD;  Location: [facility];  Service: Thoracic;  Laterality: N/A;  . Portacath placement Left 12/10/2013    Procedure: INSERTION PORT-A-CATH;  Surgeon: Grace Isaac, MD;  Location: [facility];  Service: Thoracic;  Laterality: Left;    REVIEW OF SYSTEMS:  Constitutional: positive for fatigue Eyes: negative Ears, nose,  mouth, throat, and face: negative Respiratory: negative Cardiovascular: negative Gastrointestinal: negative Genitourinary:negative Integument/breast: negative Hematologic/lymphatic: negative Musculoskeletal:negative Neurological: positive for paresthesia Behavioral/Psych: positive for anxiety Endocrine: negative Allergic/Immunologic: negative   PHYSICAL EXAMINATION: General appearance: alert, cooperative, fatigued and no distress Head: Normocephalic, without obvious abnormality, atraumatic Neck: no adenopathy, no JVD, supple, symmetrical, trachea midline and thyroid not enlarged, symmetric, no tenderness/mass/nodules Lymph nodes: Cervical, supraclavicular, and axillary nodes normal. Resp: clear to auscultation bilaterally Back: symmetric, no curvature. ROM normal. No CVA tenderness. Cardio: regular rate and rhythm, S1, S2 normal, no murmur, click, rub or gallop GI: soft, non-tender; bowel sounds normal; no masses,  no organomegaly Genitalia: defer exam Extremities: extremities normal, atraumatic, no cyanosis or edema Neurologic: Alert and oriented X 3, normal strength and tone. Normal symmetric reflexes. Normal coordination and gait  ECOG PERFORMANCE STATUS: 1 - Symptomatic but completely ambulatory  Blood pressure 146/75, pulse 99, temperature 98.3 F (36.8 C), temperature source Oral, resp. rate 18, height '5\' 2"'$  (1.575 m), weight 152 lb (68.947 kg), SpO2 100 %.  LABORATORY DATA: Lab Results  Component Value Date   WBC 11.6* 12/03/2014   HGB 11.0* 12/03/2014   HCT 33.9* 12/03/2014   MCV 95.8 12/03/2014   PLT 253 12/03/2014      Chemistry      Component Value Date/Time   NA 140 11/26/2014 1059   NA 135 07/19/2014 1607   K 3.3* 11/26/2014 1059   K 3.4* 07/19/2014 1607   CL 99* 07/19/2014 1607   CO2 29 11/26/2014 1059   CO2 28 07/19/2014 1607   BUN 14.7 11/26/2014 1059   BUN 21* 07/19/2014 1607   CREATININE 0.9 11/26/2014 1059   CREATININE 1.01* 07/19/2014 1607    CREATININE 0.80 11/22/2013 1054      Component Value Date/Time   CALCIUM 9.0 11/26/2014 1059   CALCIUM 8.7* 07/19/2014 1607   ALKPHOS 123 11/26/2014 1059   ALKPHOS 77 12/10/2013 0637   AST 23 11/26/2014 1059   AST 16 12/10/2013 0637   ALT 21 11/26/2014 1059   ALT 16 12/10/2013 0637   BILITOT <0.30 11/26/2014 1059   BILITOT 0.4 12/10/2013 0637       RADIOGRAPHIC STUDIES: Ct Chest W Contrast  11/10/2014  CLINICAL DATA:  Subsequent encounter for stage IV squamous cell carcinoma the left lung. EXAM: CT CHEST, ABDOMEN, AND PELVIS WITH CONTRAST TECHNIQUE: Multidetector CT imaging of the chest, abdomen and pelvis was performed following the standard protocol during bolus administration of intravenous contrast. CONTRAST:  147m OMNIPAQUE IOHEXOL 300 MG/ML  SOLN COMPARISON:  08/28/2014. FINDINGS: CT CHEST FINDINGS Mediastinum/Lymph Nodes: The tip of the left-sided Port-A-Cath is positioned in the distal SVC. There is no axillary lymphadenopathy. Indications were not saved on the prior study, so lesions used for comparative purposes today were remeasured by me on the prior exam. High right paratracheal lymph node which is 11 mm in short axis today was 12 mm previously. 18 mm short axis precarinal lymph node on today's study was 17 mm previously. 18 mm subcarinal lymph node on today's exam was 24 mm previously. On today's exam, there is an 11 mm short axis lymph node adjacent to the distal esophagus. This lymph node measured 15 mm in short axis previously. The heart size is normal. No pericardial effusion. Coronary artery calcification is noted. Lungs/Pleura: Amorphous prevascular lesion along the left mediastinum measures 5.1 x 3.3 cm today compared to 5.1 x 4.5 cm previously. 6 mm nodule in the posterIor right costophrenic sulcus (image 46 series 4) was 3 mm previously. 6 mm nodule on the same image along the dome of the right hemidiaphragm was 1 mm previously. 6 mm left lower lobe nodule seen posteriorly  on image 31 today was 5 mm previously. Two other left lower lobe nodules seen on the same image are new in the interval. No pulmonary edema or pleural effusion. Musculoskeletal: Bone windows reveal no worrisome lytic or sclerotic osseous lesions. CT ABDOMEN PELVIS FINDINGS Hepatobiliary: Scattered tiny hypo attenuating lesions in the liver parenchyma are stable in the interval and likely represent cysts. There is no evidence for gallstones, gallbladder wall thickening, or pericholecystic fluid. No intrahepatic or extrahepatic biliary dilation. Pancreas: No focal mass lesion. No dilatation of the main duct. No intraparenchymal cyst. No peripancreatic edema. Spleen: No splenomegaly. No focal mass lesion. Adrenals/Urinary Tract: No adrenal nodule or mass. Right kidney malrotated but  otherwise unremarkable. Several punctate nonobstructing stones evident in the left kidney are probable, but there is associated vascular calcification which could account for some of this appearance. No hydronephrosis. No evidence for hydroureter The urinary bladder appears normal for the degree of distention. Stomach/Bowel: Stomach is nondistended. No gastric wall thickening. No evidence of outlet obstruction. Duodenum is normally positioned as is the ligament of Treitz. No small bowel wall thickening. No small bowel dilatation. The terminal ileum is normal. The appendix is not visualized, but there is no edema or inflammation in the region of the cecum. No gross colonic mass. No colonic wall thickening. No substantial diverticular change. Vascular/Lymphatic: Fusiform abdominal aortic aneurysm measures up to 3.1 cm in diameter. Prominent gonadal venous anatomy in the left anatomic pelvis. There is evidence of nonocclusive thrombus in some of these gonadal veins, unchanged in the interval. There is no gastrohepatic or hepatoduodenal ligament lymphadenopathy. No intraperitoneal or retroperitoneal lymphadenopy. No pelvic sidewall  lymphadenopathy. Reproductive: Fibroid changes noted in the uterus. Stable appearance of a 4.6 cm left adnexal cystic lesion. No right adnexal mass. Other: No intraperitoneal free fluid. Musculoskeletal: Bone windows reveal no worrisome lytic or sclerotic osseous lesions. IMPRESSION: 1. Amorphous lesion in the medial left upper lobe measure slightly smaller in the interval. 2. Mediastinal lymphadenopathy overall measures decreased in the interval. 3. As before, small bilateral pulmonary nodules have a basilar predominance. Nodules appear slightly increased in the interval. Some nodules are new in the interval. 4. 3.1 cm abdominal aortic aneurysm, as before. 5. 4.6 cm left adnexal cystic lesion is unchanged, likely benign. Electronically Signed   By: Misty Stanley M.D.   On: 11/10/2014 11:31   Ct Abdomen Pelvis W Contrast  11/10/2014  CLINICAL DATA:  Subsequent encounter for stage IV squamous cell carcinoma the left lung. EXAM: CT CHEST, ABDOMEN, AND PELVIS WITH CONTRAST TECHNIQUE: Multidetector CT imaging of the chest, abdomen and pelvis was performed following the standard protocol during bolus administration of intravenous contrast. CONTRAST:  110m OMNIPAQUE IOHEXOL 300 MG/ML  SOLN COMPARISON:  08/28/2014. FINDINGS: CT CHEST FINDINGS Mediastinum/Lymph Nodes: The tip of the left-sided Port-A-Cath is positioned in the distal SVC. There is no axillary lymphadenopathy. Indications were not saved on the prior study, so lesions used for comparative purposes today were remeasured by me on the prior exam. High right paratracheal lymph node which is 11 mm in short axis today was 12 mm previously. 18 mm short axis precarinal lymph node on today's study was 17 mm previously. 18 mm subcarinal lymph node on today's exam was 24 mm previously. On today's exam, there is an 11 mm short axis lymph node adjacent to the distal esophagus. This lymph node measured 15 mm in short axis previously. The heart size is normal. No  pericardial effusion. Coronary artery calcification is noted. Lungs/Pleura: Amorphous prevascular lesion along the left mediastinum measures 5.1 x 3.3 cm today compared to 5.1 x 4.5 cm previously. 6 mm nodule in the posterIor right costophrenic sulcus (image 46 series 4) was 3 mm previously. 6 mm nodule on the same image along the dome of the right hemidiaphragm was 1 mm previously. 6 mm left lower lobe nodule seen posteriorly on image 31 today was 5 mm previously. Two other left lower lobe nodules seen on the same image are new in the interval. No pulmonary edema or pleural effusion. Musculoskeletal: Bone windows reveal no worrisome lytic or sclerotic osseous lesions. CT ABDOMEN PELVIS FINDINGS Hepatobiliary: Scattered tiny hypo attenuating lesions in the liver parenchyma  are stable in the interval and likely represent cysts. There is no evidence for gallstones, gallbladder wall thickening, or pericholecystic fluid. No intrahepatic or extrahepatic biliary dilation. Pancreas: No focal mass lesion. No dilatation of the main duct. No intraparenchymal cyst. No peripancreatic edema. Spleen: No splenomegaly. No focal mass lesion. Adrenals/Urinary Tract: No adrenal nodule or mass. Right kidney malrotated but otherwise unremarkable. Several punctate nonobstructing stones evident in the left kidney are probable, but there is associated vascular calcification which could account for some of this appearance. No hydronephrosis. No evidence for hydroureter The urinary bladder appears normal for the degree of distention. Stomach/Bowel: Stomach is nondistended. No gastric wall thickening. No evidence of outlet obstruction. Duodenum is normally positioned as is the ligament of Treitz. No small bowel wall thickening. No small bowel dilatation. The terminal ileum is normal. The appendix is not visualized, but there is no edema or inflammation in the region of the cecum. No gross colonic mass. No colonic wall thickening. No  substantial diverticular change. Vascular/Lymphatic: Fusiform abdominal aortic aneurysm measures up to 3.1 cm in diameter. Prominent gonadal venous anatomy in the left anatomic pelvis. There is evidence of nonocclusive thrombus in some of these gonadal veins, unchanged in the interval. There is no gastrohepatic or hepatoduodenal ligament lymphadenopathy. No intraperitoneal or retroperitoneal lymphadenopy. No pelvic sidewall lymphadenopathy. Reproductive: Fibroid changes noted in the uterus. Stable appearance of a 4.6 cm left adnexal cystic lesion. No right adnexal mass. Other: No intraperitoneal free fluid. Musculoskeletal: Bone windows reveal no worrisome lytic or sclerotic osseous lesions. IMPRESSION: 1. Amorphous lesion in the medial left upper lobe measure slightly smaller in the interval. 2. Mediastinal lymphadenopathy overall measures decreased in the interval. 3. As before, small bilateral pulmonary nodules have a basilar predominance. Nodules appear slightly increased in the interval. Some nodules are new in the interval. 4. 3.1 cm abdominal aortic aneurysm, as before. 5. 4.6 cm left adnexal cystic lesion is unchanged, likely benign. Electronically Signed   By: Misty Stanley M.D.   On: 11/10/2014 11:31    ASSESSMENT AND PLAN: This is a very pleasant 69 years old white female recently diagnosed with:  1) Stage IV non-small cell lung cancer, squamous cell carcinoma completed systemic chemotherapy with carboplatin and Abraxane status post 6 cycles.  She is currently on treatment with immunotherapy according to the BMS checkmate 370 clinical trial with Nivolumab.she status post 7 cycles and tolerating her last treatment fairly well with no significant diarrhea. Her treatment was also discontinued secondary to disease progression. The patient is currently undergoing systemic chemotherapy with docetaxel and [place] is status post 4 cycles and tolerating her treatment well except for fatigue. I  recommended for her to continue her current treatment with docetaxel and Cyramza. She will proceed with cycle #5 today as scheduled. The patient would come back for follow-up visit in 3 weeks for reevaluation before starting cycle #6.  The patient was advised to call immediately if she has any concerning symptoms in the interval.  The patient voices understanding of current disease status and treatment options and is in agreement with the current care plan.  All questions were answered. The patient knows to call the clinic with any problems, questions or concerns. We can certainly see the patient much sooner if necessary.  Disclaimer: This note was dictated with voice recognition software. Similar sounding words can inadvertently be transcribed and may not be corrected upon review.

## 2014-12-03 NOTE — Telephone Encounter (Signed)
Gave adn printed appt sched and avs for pt for OCT and NOV

## 2014-12-03 NOTE — Progress Notes (Signed)
BMS 370: Group B, Arm A. Follow up visit #2. I met with patient and patient's cousin after her PRO's were completed with research assistant Remer Macho, lab appointment and during her resting vitals assessment. B/P=146/75, T=98.3, R= 19, HR=99, O2 sat @ 100%, patient weight 152 lbs. Patient with appt today with Dr. [name]. Patient confirms to be seeing her endocrinologist and states her last appt with him was approximately 3 weeks ago, also confirms to be continuing with thyroid medication methimazole. Patient reports no changes to her concomitant medications. Patient does report having more SOB than usual especially when doing more strenuous activities such as "carrying a load of laundry" and reports to be using her pro air more than usual. Patient with watery eyes and runny nose, states does take Claritin daily and reports it works occasionally. Patient also reports sleeping better, continued baseline neuropathy to bilat feet and fingertips. Patient also notes a decrease in appetite. Patient denies any pain, denies nausea or vomiting, denies bowel or bladder issues, denies cough, denies rash, denies headaches or weakness, but does confirm fatigue especially after Neulasta injection. I informed patient that next study for follow-up will be in 3 months, approximately this time in January of 2017. I thanked patient for her time and her continuous support of study. All patient's questions answered to her satisfaction and she was encouraged to contact Dr. [name] or myself with any questions or concerns she may have related to study. [name], RN, BSN Clinical Research 12/03/2014 11:04 AM

## 2014-12-03 NOTE — Patient Instructions (Signed)
[facility] Discharge Instructions for Patients Receiving Chemotherapy  Today you received the following chemotherapy agents TAXOL and the immunotherapy cyramza.  To help prevent nausea and vomiting after your treatment, we encourage you to take your nausea medication as prescribed.   If you develop nausea and vomiting that is not controlled by your nausea medication, call the clinic.   BELOW ARE SYMPTOMS THAT SHOULD BE REPORTED IMMEDIATELY:  *FEVER GREATER THAN 100.5 F  *CHILLS WITH OR WITHOUT FEVER  NAUSEA AND VOMITING THAT IS NOT CONTROLLED WITH YOUR NAUSEA MEDICATION  *UNUSUAL SHORTNESS OF BREATH  *UNUSUAL BRUISING OR BLEEDING  TENDERNESS IN MOUTH AND THROAT WITH OR WITHOUT PRESENCE OF ULCERS  *URINARY PROBLEMS  *BOWEL PROBLEMS  UNUSUAL RASH Items with * indicate a potential emergency and should be followed up as soon as possible.  Feel free to call the clinic you have any questions or concerns. The clinic phone number is [phone].  Please show the [facility] at check-in to the Emergency Department and triage nurse.  Ramucirumab injection What is this medicine? RAMUCIRUMAB (ra mue SIR ue mab) is a monoclonal antibody. It is used to treat stomach cancer, colorectal cancer, or lung cancer. This medicine may be used for other purposes; ask your health care provider or pharmacist if you have questions. What should I tell my health care provider before I take this medicine? They need to know if you have any of these conditions: -bleeding disorders -blood clots -heart disease, including heart failure, heart attack, or chest pain (angina) -high blood pressure -infection (especially a virus infection such as chickenpox, cold sores, or herpes) -protein in your urine -recent surgery -stroke -an unusual or allergic reaction to ramucirumab, other medicines, foods, dyes, or preservatives -pregnant or trying to get pregnant -breast-feeding How should  I use this medicine? This medicine is for infusion into a vein. It is given by a health care professional in a hospital or clinic setting. Talk to your pediatrician regarding the use of this medicine in children. Special care may be needed. Overdosage: If you think you have taken too much of this medicine contact a poison control center or emergency room at once. NOTE: This medicine is only for you. Do not share this medicine with others. What if I miss a dose? It is important not to miss your dose. Call your doctor or health care professional if you are unable to keep an appointment. What may interact with this medicine? Interactions have not been studied. This list may not describe all possible interactions. Give your health care provider a list of all the medicines, herbs, non-prescription drugs, or dietary supplements you use. Also tell them if you smoke, drink alcohol, or use illegal drugs. Some items may interact with your medicine. What should I watch for while using this medicine? Your condition will be monitored carefully while you are receiving this medicine. You will need to to check your blood pressure and have your blood and urine tested while you are taking this medicine. Your condition will be monitored carefully while you are receiving this medicine. This medicine may increase your risk to bruise or bleed. Call your doctor or health care professional if you notice any unusual bleeding. This medicine may rarely cause 'gastrointestinal perforation' (holes in the stomach, intestines or colon), a serious side effect requiring surgery to repair. This medicine should be started at least 28 days following major surgery and the site of the surgery should be totally healed. Check with  your doctor before scheduling dental work or surgery while you are receiving this treatment. Talk to your doctor if you have recently had surgery or if you have a wound that has not healed. Do not become pregnant  while taking this medicine or for 3 months after stopping it. Women should inform their doctor if they wish to become pregnant or think they might be pregnant. There is a potential for serious side effects to an unborn child. Talk to your health care professional or pharmacist for more information. What side effects may I notice from receiving this medicine? Side effects that you should report to your doctor or health care professional as soon as possible: -allergic reactions like skin rash, itching or hives, breathing problems, swelling of the face, lips, or tongue -signs of infection - fever or chills, cough, sore throat -chest pain or chest tightness -confusion -dizziness -feeling faint or lightheaded, falls -severe abdominal pain -severe nausea, vomiting -signs and symptoms of bleeding such as bloody or black, tarry stools; red or dark-brown urine; spitting up blood or brown material that looks like coffee grounds; red spots on the skin; unusual bruising or bleeding from the eye, gums, or nose -signs and symptoms of a blood clot such as breathing problems; changes in vision; chest pain; severe, sudden headache; pain, swelling, warmth in the leg; trouble speaking; sudden numbness or weakness of the face, arm or leg -symptoms of a stroke: change in mental awareness, inability to talk or move one side of the body -trouble walking, dizziness, loss of balance or coordination Side effects that usually do not require medical attention (Report these to your doctor or health care professional if they continue or are bothersome.): -cold, clammy skin -constipation -diarrhea -headache -nausea, vomiting -stomach pain -unusually slow heartbeat -unusually weak or tired This list may not describe all possible side effects. Call your doctor for medical advice about side effects. You may report side effects to FDA at 1-800-FDA-1088. Where should I keep my medicine? This drug is given in a hospital or  clinic and will not be stored at home. NOTE: This sheet is a summary. It may not cover all possible information. If you have questions about this medicine, talk to your doctor, pharmacist, or health care provider.    2016, [employer]vier/[employer]. (2014-03-25 17:37:19)

## 2014-12-05 ENCOUNTER — Ambulatory Visit (HOSPITAL_BASED_OUTPATIENT_CLINIC_OR_DEPARTMENT_OTHER): Payer: Medicare Other

## 2014-12-05 VITALS — BP 142/82 | HR 87 | Temp 98.2°F

## 2014-12-05 DIAGNOSIS — Z5189 Encounter for other specified aftercare: Secondary | ICD-10-CM

## 2014-12-05 DIAGNOSIS — C3412 Malignant neoplasm of upper lobe, left bronchus or lung: Secondary | ICD-10-CM

## 2014-12-05 DIAGNOSIS — C3492 Malignant neoplasm of unspecified part of left bronchus or lung: Secondary | ICD-10-CM

## 2014-12-05 MED ORDER — PEGFILGRASTIM INJECTION 6 MG/0.6ML ~~LOC~~
6.0000 mg | PREFILLED_SYRINGE | Freq: Once | SUBCUTANEOUS | Status: AC
  Administered 2014-12-05: 6 mg via SUBCUTANEOUS
  Filled 2014-12-05: qty 0.6

## 2014-12-09 ENCOUNTER — Other Ambulatory Visit: Payer: Self-pay | Admitting: Internal Medicine

## 2014-12-09 ENCOUNTER — Telehealth: Payer: Self-pay | Admitting: Medical Oncology

## 2014-12-09 ENCOUNTER — Other Ambulatory Visit: Payer: Self-pay | Admitting: Medical Oncology

## 2014-12-09 DIAGNOSIS — C3490 Malignant neoplasm of unspecified part of unspecified bronchus or lung: Secondary | ICD-10-CM

## 2014-12-09 NOTE — Telephone Encounter (Signed)
Per Husband pt having some diarrhea , dry heaves. She is sleeping now. Refill called in.

## 2014-12-10 ENCOUNTER — Other Ambulatory Visit: Payer: Self-pay | Admitting: Medical Oncology

## 2014-12-10 ENCOUNTER — Other Ambulatory Visit (HOSPITAL_BASED_OUTPATIENT_CLINIC_OR_DEPARTMENT_OTHER): Payer: Medicare Other

## 2014-12-10 ENCOUNTER — Telehealth: Payer: Self-pay | Admitting: Medical Oncology

## 2014-12-10 DIAGNOSIS — C3492 Malignant neoplasm of unspecified part of left bronchus or lung: Secondary | ICD-10-CM

## 2014-12-10 DIAGNOSIS — C3412 Malignant neoplasm of upper lobe, left bronchus or lung: Secondary | ICD-10-CM

## 2014-12-10 DIAGNOSIS — C3490 Malignant neoplasm of unspecified part of unspecified bronchus or lung: Secondary | ICD-10-CM

## 2014-12-10 LAB — CBC WITH DIFFERENTIAL/PLATELET
BASO%: 1.3 % (ref 0.0–2.0)
BASOS ABS: 0 10*3/uL (ref 0.0–0.1)
EOS%: 0.9 % (ref 0.0–7.0)
Eosinophils Absolute: 0 10*3/uL (ref 0.0–0.5)
HEMATOCRIT: 34 % — AB (ref 34.8–46.6)
HGB: 11.1 g/dL — ABNORMAL LOW (ref 11.6–15.9)
LYMPH%: 29.6 % (ref 14.0–49.7)
MCH: 30.9 pg (ref 25.1–34.0)
MCHC: 32.7 g/dL (ref 31.5–36.0)
MCV: 94.4 fL (ref 79.5–101.0)
MONO#: 0.5 10*3/uL (ref 0.1–0.9)
MONO%: 17.8 % — ABNORMAL HIGH (ref 0.0–14.0)
NEUT#: 1.4 10*3/uL — ABNORMAL LOW (ref 1.5–6.5)
NEUT%: 50.4 % (ref 38.4–76.8)
PLATELETS: 142 10*3/uL — AB (ref 145–400)
RBC: 3.6 10*6/uL — AB (ref 3.70–5.45)
RDW: 19.8 % — ABNORMAL HIGH (ref 11.2–14.5)
WBC: 2.8 10*3/uL — ABNORMAL LOW (ref 3.9–10.3)
lymph#: 0.8 10*3/uL — ABNORMAL LOW (ref 0.9–3.3)

## 2014-12-10 LAB — COMPREHENSIVE METABOLIC PANEL (CC13)
ALBUMIN: 3.5 g/dL (ref 3.5–5.0)
ALK PHOS: 85 U/L (ref 40–150)
ALT: 20 U/L (ref 0–55)
ANION GAP: 9 meq/L (ref 3–11)
AST: 20 U/L (ref 5–34)
BILIRUBIN TOTAL: 0.55 mg/dL (ref 0.20–1.20)
BUN: 22.1 mg/dL (ref 7.0–26.0)
CALCIUM: 9 mg/dL (ref 8.4–10.4)
CHLORIDE: 101 meq/L (ref 98–109)
CO2: 29 mEq/L (ref 22–29)
CREATININE: 0.9 mg/dL (ref 0.6–1.1)
EGFR: 62 mL/min/{1.73_m2} — ABNORMAL LOW (ref >90)
Glucose: 114 mg/dl (ref 70–140)
Potassium: 3.6 mEq/L (ref 3.5–5.1)
Sodium: 140 mEq/L (ref 136–145)
Total Protein: 6.5 g/dL (ref 6.4–8.3)

## 2014-12-10 MED ORDER — TEMAZEPAM 30 MG PO CAPS: 30.0000 mg | ORAL_CAPSULE | Freq: Every evening | ORAL | Status: DC | PRN

## 2014-12-10 NOTE — Progress Notes (Signed)
Pt requested refill on temazepam . She is going out of town and only has 1-2 left. Refill called in.

## 2014-12-10 NOTE — Telephone Encounter (Addendum)
Faxed labs to endocrinologist.

## 2014-12-17 ENCOUNTER — Other Ambulatory Visit (HOSPITAL_BASED_OUTPATIENT_CLINIC_OR_DEPARTMENT_OTHER): Payer: Medicare Other

## 2014-12-17 DIAGNOSIS — C3412 Malignant neoplasm of upper lobe, left bronchus or lung: Secondary | ICD-10-CM

## 2014-12-17 DIAGNOSIS — C3492 Malignant neoplasm of unspecified part of left bronchus or lung: Secondary | ICD-10-CM

## 2014-12-17 LAB — CBC WITH DIFFERENTIAL/PLATELET
BASO%: 0.7 % (ref 0.0–2.0)
Basophils Absolute: 0.1 10*3/uL (ref 0.0–0.1)
EOS ABS: 0 10*3/uL (ref 0.0–0.5)
EOS%: 0.1 % (ref 0.0–7.0)
HCT: 35.1 % (ref 34.8–46.6)
HGB: 11.4 g/dL — ABNORMAL LOW (ref 11.6–15.9)
LYMPH%: 9.7 % — AB (ref 14.0–49.7)
MCH: 30.8 pg (ref 25.1–34.0)
MCHC: 32.3 g/dL (ref 31.5–36.0)
MCV: 95.1 fL (ref 79.5–101.0)
MONO#: 0.7 10*3/uL (ref 0.1–0.9)
MONO%: 4.8 % (ref 0.0–14.0)
NEUT%: 84.7 % — AB (ref 38.4–76.8)
NEUTROS ABS: 12.5 10*3/uL — AB (ref 1.5–6.5)
PLATELETS: 231 10*3/uL (ref 145–400)
RBC: 3.69 10*6/uL — AB (ref 3.70–5.45)
RDW: 19.5 % — AB (ref 11.2–14.5)
WBC: 14.7 10*3/uL — AB (ref 3.9–10.3)
lymph#: 1.4 10*3/uL (ref 0.9–3.3)

## 2014-12-17 LAB — COMPREHENSIVE METABOLIC PANEL (CC13)
ALT: 22 U/L (ref 0–55)
ANION GAP: 12 meq/L — AB (ref 3–11)
AST: 24 U/L (ref 5–34)
Albumin: 3.5 g/dL (ref 3.5–5.0)
Alkaline Phosphatase: 116 U/L (ref 40–150)
BILIRUBIN TOTAL: 0.34 mg/dL (ref 0.20–1.20)
BUN: 13 mg/dL (ref 7.0–26.0)
CHLORIDE: 101 meq/L (ref 98–109)
CO2: 29 meq/L (ref 22–29)
Calcium: 9.1 mg/dL (ref 8.4–10.4)
Creatinine: 0.8 mg/dL (ref 0.6–1.1)
EGFR: 71 mL/min/{1.73_m2} — AB (ref >90)
GLUCOSE: 85 mg/dL (ref 70–140)
Potassium: 3.8 mEq/L (ref 3.5–5.1)
SODIUM: 142 meq/L (ref 136–145)
TOTAL PROTEIN: 6.6 g/dL (ref 6.4–8.3)

## 2014-12-24 ENCOUNTER — Encounter: Payer: Self-pay | Admitting: Internal Medicine

## 2014-12-24 ENCOUNTER — Ambulatory Visit (HOSPITAL_BASED_OUTPATIENT_CLINIC_OR_DEPARTMENT_OTHER): Payer: Medicare Other | Admitting: Internal Medicine

## 2014-12-24 ENCOUNTER — Ambulatory Visit (HOSPITAL_BASED_OUTPATIENT_CLINIC_OR_DEPARTMENT_OTHER): Payer: Medicare Other

## 2014-12-24 ENCOUNTER — Other Ambulatory Visit (HOSPITAL_BASED_OUTPATIENT_CLINIC_OR_DEPARTMENT_OTHER): Payer: Medicare Other

## 2014-12-24 ENCOUNTER — Telehealth: Payer: Self-pay | Admitting: Internal Medicine

## 2014-12-24 VITALS — BP 144/66 | HR 105 | Temp 98.4°F | Resp 18 | Ht 62.0 in | Wt 149.8 lb

## 2014-12-24 VITALS — HR 83

## 2014-12-24 DIAGNOSIS — C3492 Malignant neoplasm of unspecified part of left bronchus or lung: Secondary | ICD-10-CM

## 2014-12-24 DIAGNOSIS — C3412 Malignant neoplasm of upper lobe, left bronchus or lung: Secondary | ICD-10-CM

## 2014-12-24 DIAGNOSIS — M899 Disorder of bone, unspecified: Secondary | ICD-10-CM | POA: Diagnosis not present

## 2014-12-24 DIAGNOSIS — G609 Hereditary and idiopathic neuropathy, unspecified: Secondary | ICD-10-CM | POA: Diagnosis not present

## 2014-12-24 DIAGNOSIS — Z5112 Encounter for antineoplastic immunotherapy: Secondary | ICD-10-CM | POA: Diagnosis not present

## 2014-12-24 DIAGNOSIS — Z5111 Encounter for antineoplastic chemotherapy: Secondary | ICD-10-CM

## 2014-12-24 LAB — CBC WITH DIFFERENTIAL/PLATELET
BASO%: 0.1 % (ref 0.0–2.0)
Basophils Absolute: 0 10*3/uL (ref 0.0–0.1)
EOS%: 0 % (ref 0.0–7.0)
Eosinophils Absolute: 0 10*3/uL (ref 0.0–0.5)
HEMATOCRIT: 36.7 % (ref 34.8–46.6)
HEMOGLOBIN: 11.9 g/dL (ref 11.6–15.9)
LYMPH#: 1.6 10*3/uL (ref 0.9–3.3)
LYMPH%: 10.5 % — ABNORMAL LOW (ref 14.0–49.7)
MCH: 31.8 pg (ref 25.1–34.0)
MCHC: 32.4 g/dL (ref 31.5–36.0)
MCV: 98.1 fL (ref 79.5–101.0)
MONO#: 0.9 10*3/uL (ref 0.1–0.9)
MONO%: 5.7 % (ref 0.0–14.0)
NEUT%: 83.7 % — ABNORMAL HIGH (ref 38.4–76.8)
NEUTROS ABS: 12.8 10*3/uL — AB (ref 1.5–6.5)
Platelets: 267 10*3/uL (ref 145–400)
RBC: 3.74 10*6/uL (ref 3.70–5.45)
RDW: 18.2 % — AB (ref 11.2–14.5)
WBC: 15.3 10*3/uL — AB (ref 3.9–10.3)

## 2014-12-24 LAB — COMPREHENSIVE METABOLIC PANEL (CC13)
ALBUMIN: 3.5 g/dL (ref 3.5–5.0)
ALK PHOS: 83 U/L (ref 40–150)
ALT: 24 U/L (ref 0–55)
AST: 17 U/L (ref 5–34)
Anion Gap: 13 mEq/L — ABNORMAL HIGH (ref 3–11)
BUN: 27.1 mg/dL — AB (ref 7.0–26.0)
CO2: 26 mEq/L (ref 22–29)
CREATININE: 0.9 mg/dL (ref 0.6–1.1)
Calcium: 9.2 mg/dL (ref 8.4–10.4)
Chloride: 101 mEq/L (ref 98–109)
EGFR: 64 mL/min/{1.73_m2} — ABNORMAL LOW (ref >90)
GLUCOSE: 156 mg/dL — AB (ref 70–140)
POTASSIUM: 4.2 meq/L (ref 3.5–5.1)
SODIUM: 139 meq/L (ref 136–145)
TOTAL PROTEIN: 6.2 g/dL — AB (ref 6.4–8.3)
Total Bilirubin: 0.33 mg/dL (ref 0.20–1.20)

## 2014-12-24 MED ORDER — HEPARIN SOD (PORK) LOCK FLUSH 100 UNIT/ML IV SOLN
500.0000 [IU] | Freq: Once | INTRAVENOUS | Status: AC | PRN
  Administered 2014-12-24: 500 [IU]
  Filled 2014-12-24: qty 5

## 2014-12-24 MED ORDER — DIPHENHYDRAMINE HCL 50 MG/ML IJ SOLN
50.0000 mg | Freq: Once | INTRAMUSCULAR | Status: AC
  Administered 2014-12-24: 50 mg via INTRAVENOUS

## 2014-12-24 MED ORDER — SODIUM CHLORIDE 0.9 % IJ SOLN
10.0000 mL | INTRAMUSCULAR | Status: DC | PRN
  Administered 2014-12-24: 10 mL
  Filled 2014-12-24: qty 10

## 2014-12-24 MED ORDER — DIPHENHYDRAMINE HCL 50 MG/ML IJ SOLN
INTRAMUSCULAR | Status: AC
  Filled 2014-12-24: qty 1

## 2014-12-24 MED ORDER — RAMUCIRUMAB CHEMO INJECTION 500 MG/50ML
10.0000 mg/kg | Freq: Once | INTRAVENOUS | Status: AC
  Administered 2014-12-24: 700 mg via INTRAVENOUS
  Filled 2014-12-24: qty 70

## 2014-12-24 MED ORDER — SODIUM CHLORIDE 0.9 % IV SOLN
Freq: Once | INTRAVENOUS | Status: AC
  Administered 2014-12-24: 13:00:00 via INTRAVENOUS

## 2014-12-24 MED ORDER — ACETAMINOPHEN 325 MG PO TABS
650.0000 mg | ORAL_TABLET | Freq: Once | ORAL | Status: AC
  Administered 2014-12-24: 650 mg via ORAL

## 2014-12-24 MED ORDER — ACETAMINOPHEN 325 MG PO TABS
ORAL_TABLET | ORAL | Status: AC
  Filled 2014-12-24: qty 2

## 2014-12-24 MED ORDER — DOCETAXEL CHEMO INJECTION 160 MG/16ML
75.0000 mg/m2 | Freq: Once | INTRAVENOUS | Status: AC
  Administered 2014-12-24: 130 mg via INTRAVENOUS
  Filled 2014-12-24: qty 13

## 2014-12-24 MED ORDER — SODIUM CHLORIDE 0.9 % IV SOLN
Freq: Once | INTRAVENOUS | Status: AC
  Administered 2014-12-24: 13:00:00 via INTRAVENOUS
  Filled 2014-12-24: qty 4

## 2014-12-24 NOTE — Progress Notes (Signed)
BUN 27.1. Reviewed labs with Dr. [name]. Okay to treat per Dr. [name].

## 2014-12-24 NOTE — Progress Notes (Signed)
[facility] [phone]   [phone] [facility] [address] [employer] [address] [place] [address]  DIAGNOSIS: Stage IV (T2, N2, M1a) non-small cell lung cancer, squamous cell carcinoma presented with central left upper lobe lung mass with associated postobstructive atelectasis as well as ipsilateral and subcarinal lymphadenopathy as well as pleural tumor spread diagnosed in October 2015.  PRIOR THERAPY:  1) Systemic chemotherapy with carboplatin for AUC of 5 on day 1 and Abraxane 100 MG/M2 on days 1, 8 and 15 every 3 weeks. Status post 6 cycles.  2) Nivolumab per research protocol BMS 370 group B, arm A, given every 2 weeks. Status post 7 cycles. Last dose was given 08/13/2014 discontinued on 08/28/2014 secondary to disease progression.  CURRENT THERAPY: Systemic chemotherapy with docetaxel 75 MG/M2 and Cyramza 10 MG/KG every 3 weeks. First dose 09/10/2014. Status post 5 cycles.  INTERVAL HISTORY: Tammie Clay 69 y.o. female returns to the clinic today for follow-up visit accompanied by her girlfriend. The patient is currently on treatment with docetaxel and Cyramza status post 5 cycles and tolerating her treatment fairly well except for fatigue after the Neulasta injection. She continues to have peripheral neuropathy. She denied having any weight loss or night sweats. The patient denied having any chest pain, shortness of breath, cough or hemoptysis. She has no bleeding issues. She has no nausea or vomiting and no fever or chills. The patient is here today to start cycle #6 of her systemic chemotherapy.  MEDICAL HISTORY: Past Medical History  Diagnosis Date  . Hypertension   . COPD (chronic obstructive pulmonary disease) ([facility])   . Hyperlipidemia   . GERD (gastroesophageal reflux disease)   . Osteopenia   . Insomnia   . Mediastinal lymphadenopathy 11/19/13    PER CT  . Multiple lung nodules on CT 11/19/13   LEFT LUNG BASE  . Lung mass 11/19/13    RUL PER CT  . Coronary artery calcification seen on CAT scan 11/21/2013  . COPD (chronic obstructive pulmonary disease) ([facility]) 11/21/2013  . Pneumonia     hx of  . Anxiety   . stage iv sq cell lung ca dx'd 11/2013    [facility]   . Encounter for antineoplastic chemotherapy 12/03/2014    ALLERGIES:  has No Known Allergies.  MEDICATIONS:  Current Outpatient Prescriptions  Medication Sig Dispense Refill  . acetaminophen (TYLENOL) 325 MG tablet Take 650 mg by mouth daily as needed for moderate pain or headache. Take 2 tablets every morning    . albuterol (PROVENTIL HFA;VENTOLIN HFA) 108 (90 BASE) MCG/ACT inhaler Inhale 2 puffs into the lungs every 6 (six) hours as needed for wheezing.    . calcium carbonate (TUMS - DOSED IN MG ELEMENTAL CALCIUM) 500 MG chewable tablet Chew 1 tablet by mouth daily as needed for indigestion or heartburn.     . dexamethasone (DECADRON) 4 MG tablet 2 tablets by mouth twice a day, the day before, day of and day after the chemotherapy every 3 weeks 40 tablet 1  . diphenoxylate-atropine (LOMOTIL) 2.5-0.025 MG tablet TAKE 2 TABLETS BY MOUTH 4 TIMES A DAY AS NEEDED FOR DIARRHEA 30 tablet 0  . docusate sodium (COLACE) 100 MG capsule Take 100 mg by mouth 2 (two) times daily as needed for moderate constipation.     . DULoxetine (CYMBALTA) 30 MG capsule TAKE ONE CAPSULE BY MOUTH EVERY DAY 30 capsule 2  . HYDROcodone-acetaminophen (NORCO) 10-325 MG  per tablet Take 1 tablet by mouth every 6 (six) hours as needed for moderate pain or severe pain.     Marland Kitchen lidocaine-prilocaine (EMLA) cream Apply 1 application topically as needed. 30 g 0  . lisinopril-hydrochlorothiazide (PRINZIDE,ZESTORETIC) 20-12.5 MG per tablet Take 2 tablets by mouth every morning.     . loperamide (IMODIUM) 2 MG capsule Take by mouth as needed for diarrhea or loose stools.    Marland Kitchen loratadine (CLARITIN) 10 MG tablet Take 10 mg by mouth daily.    . methimazole (TAPAZOLE) 10 MG  tablet Take 10 mg by mouth 2 (two) times daily.    . metoprolol succinate (TOPROL-XL) 50 MG 24 hr tablet Take 50 mg by mouth every morning. Take with or immediately following a meal.    . Multiple Vitamin (MULTIVITAMIN WITH MINERALS) TABS Take 1 tablet by mouth daily.    . polyethylene glycol (MIRALAX / GLYCOLAX) packet Take 17 g by mouth daily as needed for moderate constipation or severe constipation.     . predniSONE (DELTASONE) 10 MG tablet TAKE AS DIRECTED BY MOUTH EVERY DAY **6-5-4-3-2-1**  0  . prochlorperazine (COMPAZINE) 10 MG tablet Take 1 tablet (10 mg total) by mouth every 6 (six) hours as needed for nausea or vomiting. 30 tablet 1  . ranitidine (ZANTAC) 150 MG tablet Take 150 mg by mouth 2 (two) times daily as needed for heartburn.     . SYMBICORT 80-4.5 MCG/ACT inhaler Inhale 2 puffs into the lungs 2 (two) times daily.   3  . temazepam (RESTORIL) 30 MG capsule Take 1 capsule (30 mg total) by mouth at bedtime as needed for sleep. 30 capsule 0   No current facility-administered medications for this visit.    SURGICAL HISTORY:  Past Surgical History  Procedure Laterality Date  .  c section x 2    . Urethral cyst removed    . Tonsillectomy    . Appendectomy    . Colonoscopy with propofol N/A 07/17/2012    Procedure: COLONOSCOPY WITH PROPOFOL;  Surgeon: Garlan Fair, MD;  Location: [facility];  Service: Endoscopy;  Laterality: N/A;  . Esophagogastroduodenoscopy (egd) with propofol N/A 07/17/2012    Procedure: ESOPHAGOGASTRODUODENOSCOPY (EGD) WITH PROPOFOL;  Surgeon: Garlan Fair, MD;  Location: [facility];  Service: Endoscopy;  Laterality: N/A;  . Video bronchoscopy with endobronchial ultrasound N/A 12/03/2013    Procedure: VIDEO BRONCHOSCOPY WITH ENDOBRONCHIAL ULTRASOUND WITH BIOPSIES;  Surgeon: Grace Isaac, MD;  Location: [facility];  Service: Thoracic;  Laterality: N/A;  . Portacath placement Left 12/10/2013    Procedure: INSERTION PORT-A-CATH;  Surgeon: Grace Isaac, MD;  Location: [facility];  Service: Thoracic;  Laterality: Left;    REVIEW OF SYSTEMS:  Constitutional: positive for fatigue Eyes: negative Ears, nose, mouth, throat, and face: negative Respiratory: negative Cardiovascular: negative Gastrointestinal: negative Genitourinary:negative Integument/breast: negative Hematologic/lymphatic: negative Musculoskeletal:negative Neurological: positive for paresthesia Behavioral/Psych: positive for anxiety Endocrine: negative Allergic/Immunologic: negative   PHYSICAL EXAMINATION: General appearance: alert, cooperative, fatigued and no distress Head: Normocephalic, without obvious abnormality, atraumatic Neck: no adenopathy, no JVD, supple, symmetrical, trachea midline and thyroid not enlarged, symmetric, no tenderness/mass/nodules Lymph nodes: Cervical, supraclavicular, and axillary nodes normal. Resp: clear to auscultation bilaterally Back: symmetric, no curvature. ROM normal. No CVA tenderness. Cardio: regular rate and rhythm, S1, S2 normal, no murmur, click, rub or gallop GI: soft, non-tender; bowel sounds normal; no masses,  no organomegaly Genitalia: defer exam Extremities: extremities normal, atraumatic, no cyanosis or edema Neurologic: Alert and oriented  X 3, normal strength and tone. Normal symmetric reflexes. Normal coordination and gait  ECOG PERFORMANCE STATUS: 1 - Symptomatic but completely ambulatory  Blood pressure 144/66, pulse 105, temperature 98.4 F (36.9 C), temperature source Oral, resp. rate 18, height '5\' 2"'$  (1.575 m), weight 149 lb 12.8 oz (67.949 kg), SpO2 96 %.  LABORATORY DATA: Lab Results  Component Value Date   WBC 15.3* 12/24/2014   HGB 11.9 12/24/2014   HCT 36.7 12/24/2014   MCV 98.1 12/24/2014   PLT 267 12/24/2014      Chemistry      Component Value Date/Time   NA 139 12/24/2014 1022   NA 135 07/19/2014 1607   K 4.2 12/24/2014 1022   K 3.4* 07/19/2014 1607   CL 99* 07/19/2014 1607   CO2 26  12/24/2014 1022   CO2 28 07/19/2014 1607   BUN 27.1* 12/24/2014 1022   BUN 21* 07/19/2014 1607   CREATININE 0.9 12/24/2014 1022   CREATININE 1.01* 07/19/2014 1607   CREATININE 0.80 11/22/2013 1054      Component Value Date/Time   CALCIUM 9.2 12/24/2014 1022   CALCIUM 8.7* 07/19/2014 1607   ALKPHOS 83 12/24/2014 1022   ALKPHOS 77 12/10/2013 0637   AST 17 12/24/2014 1022   AST 16 12/10/2013 0637   ALT 24 12/24/2014 1022   ALT 16 12/10/2013 0637   BILITOT 0.33 12/24/2014 1022   BILITOT 0.4 12/10/2013 0637       RADIOGRAPHIC STUDIES: No results found.  ASSESSMENT AND PLAN: This is a very pleasant 69 years old white female recently diagnosed with:  1) Stage IV non-small cell lung cancer, squamous cell carcinoma completed systemic chemotherapy with carboplatin and Abraxane status post 6 cycles.  She is currently on treatment with immunotherapy according to the BMS checkmate 370 clinical trial with Nivolumab.she status post 7 cycles and tolerating her last treatment fairly well with no significant diarrhea. Her treatment was also discontinued secondary to disease progression. The patient is currently undergoing systemic chemotherapy with docetaxel and surrounds is status post 5 cycles and tolerating her treatment well except for fatigue. I recommended for her to continue her current treatment with docetaxel and Cyramza. She will proceed with cycle #6 today as scheduled. The patient would come back for follow-up visit in 3 weeks for reevaluation before starting cycle #7 after repeating CT scan of the chest, abdomen and pelvis for restaging of her disease. The patient was advised to call immediately if she has any concerning symptoms in the interval.  The patient voices understanding of current disease status and treatment options and is in agreement with the current care plan.  All questions were answered. The patient knows to call the clinic with any problems, questions or concerns. We  can certainly see the patient much sooner if necessary.  Disclaimer: This note was dictated with voice recognition software. Similar sounding words can inadvertently be transcribed and may not be corrected upon review.

## 2014-12-24 NOTE — Patient Instructions (Signed)
[facility] Discharge Instructions for Patients Receiving Chemotherapy  Today you received the following chemotherapy agents Cyramza and Taxotere To help prevent nausea and vomiting after your treatment, we encourage you to take your nausea medication as directed   If you develop nausea and vomiting that is not controlled by your nausea medication, call the clinic.   BELOW ARE SYMPTOMS THAT SHOULD BE REPORTED IMMEDIATELY:  *FEVER GREATER THAN 100.5 F  *CHILLS WITH OR WITHOUT FEVER  NAUSEA AND VOMITING THAT IS NOT CONTROLLED WITH YOUR NAUSEA MEDICATION  *UNUSUAL SHORTNESS OF BREATH  *UNUSUAL BRUISING OR BLEEDING  TENDERNESS IN MOUTH AND THROAT WITH OR WITHOUT PRESENCE OF ULCERS  *URINARY PROBLEMS  *BOWEL PROBLEMS  UNUSUAL RASH Items with * indicate a potential emergency and should be followed up as soon as possible.  Feel free to call the clinic you have any questions or concerns. The clinic phone number is [phone].  Please show the CHEMO ALERT CARD at check-in to the Emergency Department and triage nurse.   

## 2014-12-24 NOTE — Telephone Encounter (Signed)
Gave and printed appt sched and avs for pt for NOV and DEc....gave barium

## 2014-12-25 ENCOUNTER — Ambulatory Visit: Payer: Medicare Other

## 2014-12-26 ENCOUNTER — Ambulatory Visit (HOSPITAL_BASED_OUTPATIENT_CLINIC_OR_DEPARTMENT_OTHER): Payer: Medicare Other

## 2014-12-26 VITALS — BP 148/80 | HR 77 | Temp 97.8°F | Resp 18

## 2014-12-26 DIAGNOSIS — C3492 Malignant neoplasm of unspecified part of left bronchus or lung: Secondary | ICD-10-CM

## 2014-12-26 DIAGNOSIS — C3412 Malignant neoplasm of upper lobe, left bronchus or lung: Secondary | ICD-10-CM

## 2014-12-26 MED ORDER — PEGFILGRASTIM INJECTION 6 MG/0.6ML ~~LOC~~
6.0000 mg | PREFILLED_SYRINGE | Freq: Once | SUBCUTANEOUS | Status: AC
  Administered 2014-12-26: 6 mg via SUBCUTANEOUS
  Filled 2014-12-26: qty 0.6

## 2014-12-28 ENCOUNTER — Other Ambulatory Visit: Payer: Self-pay | Admitting: Internal Medicine

## 2014-12-31 ENCOUNTER — Other Ambulatory Visit (HOSPITAL_BASED_OUTPATIENT_CLINIC_OR_DEPARTMENT_OTHER): Payer: Medicare Other

## 2014-12-31 DIAGNOSIS — C3492 Malignant neoplasm of unspecified part of left bronchus or lung: Secondary | ICD-10-CM

## 2014-12-31 DIAGNOSIS — C3412 Malignant neoplasm of upper lobe, left bronchus or lung: Secondary | ICD-10-CM | POA: Diagnosis not present

## 2014-12-31 LAB — COMPREHENSIVE METABOLIC PANEL (CC13)
ALBUMIN: 3.4 g/dL — AB (ref 3.5–5.0)
ALK PHOS: 103 U/L (ref 40–150)
ALT: 26 U/L (ref 0–55)
ANION GAP: 10 meq/L (ref 3–11)
AST: 24 U/L (ref 5–34)
BUN: 24.1 mg/dL (ref 7.0–26.0)
CALCIUM: 9.4 mg/dL (ref 8.4–10.4)
CO2: 26 mEq/L (ref 22–29)
CREATININE: 1 mg/dL (ref 0.6–1.1)
Chloride: 97 mEq/L — ABNORMAL LOW (ref 98–109)
EGFR: 54 mL/min/{1.73_m2} — ABNORMAL LOW (ref >90)
Glucose: 108 mg/dl (ref 70–140)
POTASSIUM: 4 meq/L (ref 3.5–5.1)
Sodium: 134 mEq/L — ABNORMAL LOW (ref 136–145)
Total Bilirubin: 0.5 mg/dL (ref 0.20–1.20)
Total Protein: 6.4 g/dL (ref 6.4–8.3)

## 2014-12-31 LAB — CBC WITH DIFFERENTIAL/PLATELET
BASO%: 1.5 % (ref 0.0–2.0)
Basophils Absolute: 0.1 10e3/uL (ref 0.0–0.1)
EOS%: 0.7 % (ref 0.0–7.0)
Eosinophils Absolute: 0 10e3/uL (ref 0.0–0.5)
HCT: 35.8 % (ref 34.8–46.6)
HGB: 11.9 g/dL (ref 11.6–15.9)
LYMPH%: 22.8 % (ref 14.0–49.7)
MCH: 32.2 pg (ref 25.1–34.0)
MCHC: 33.2 g/dL (ref 31.5–36.0)
MCV: 96.8 fL (ref 79.5–101.0)
MONO#: 0.7 10e3/uL (ref 0.1–0.9)
MONO%: 16.2 % — ABNORMAL HIGH (ref 0.0–14.0)
NEUT#: 2.4 10e3/uL (ref 1.5–6.5)
NEUT%: 58.8 % (ref 38.4–76.8)
Platelets: 134 10e3/uL — ABNORMAL LOW (ref 145–400)
RBC: 3.7 10e6/uL (ref 3.70–5.45)
RDW: 16.6 % — ABNORMAL HIGH (ref 11.2–14.5)
WBC: 4.1 10e3/uL (ref 3.9–10.3)
lymph#: 0.9 10e3/uL (ref 0.9–3.3)

## 2015-01-02 ENCOUNTER — Other Ambulatory Visit: Payer: Self-pay | Admitting: Internal Medicine

## 2015-01-02 ENCOUNTER — Telehealth: Payer: Self-pay | Admitting: Medical Oncology

## 2015-01-02 NOTE — Telephone Encounter (Signed)
I called [facility] and told pharmacist that [facility] refill on 11/21 for lomotil

## 2015-01-07 ENCOUNTER — Other Ambulatory Visit (HOSPITAL_BASED_OUTPATIENT_CLINIC_OR_DEPARTMENT_OTHER): Payer: Medicare Other

## 2015-01-07 DIAGNOSIS — C3492 Malignant neoplasm of unspecified part of left bronchus or lung: Secondary | ICD-10-CM

## 2015-01-07 DIAGNOSIS — Z5111 Encounter for antineoplastic chemotherapy: Secondary | ICD-10-CM

## 2015-01-07 LAB — CBC WITH DIFFERENTIAL/PLATELET
BASO%: 0.2 % (ref 0.0–2.0)
Basophils Absolute: 0 10*3/uL (ref 0.0–0.1)
EOS ABS: 0 10*3/uL (ref 0.0–0.5)
EOS%: 0.1 % (ref 0.0–7.0)
HCT: 36.3 % (ref 34.8–46.6)
HGB: 11.7 g/dL (ref 11.6–15.9)
LYMPH%: 8.9 % — AB (ref 14.0–49.7)
MCH: 31.7 pg (ref 25.1–34.0)
MCHC: 32.2 g/dL (ref 31.5–36.0)
MCV: 98.4 fL (ref 79.5–101.0)
MONO#: 0.7 10*3/uL (ref 0.1–0.9)
MONO%: 4.1 % (ref 0.0–14.0)
NEUT%: 86.7 % — AB (ref 38.4–76.8)
NEUTROS ABS: 15 10*3/uL — AB (ref 1.5–6.5)
Platelets: 217 10*3/uL (ref 145–400)
RBC: 3.69 10*6/uL — AB (ref 3.70–5.45)
RDW: 16.9 % — ABNORMAL HIGH (ref 11.2–14.5)
WBC: 17.3 10*3/uL — ABNORMAL HIGH (ref 3.9–10.3)
lymph#: 1.5 10*3/uL (ref 0.9–3.3)

## 2015-01-07 LAB — COMPREHENSIVE METABOLIC PANEL (CC13)
ALT: 28 U/L (ref 0–55)
ANION GAP: 10 meq/L (ref 3–11)
AST: 30 U/L (ref 5–34)
Albumin: 3.6 g/dL (ref 3.5–5.0)
Alkaline Phosphatase: 130 U/L (ref 40–150)
BUN: 14.8 mg/dL (ref 7.0–26.0)
CHLORIDE: 101 meq/L (ref 98–109)
CO2: 29 meq/L (ref 22–29)
CREATININE: 0.9 mg/dL (ref 0.6–1.1)
Calcium: 9.4 mg/dL (ref 8.4–10.4)
EGFR: 66 mL/min/{1.73_m2} — ABNORMAL LOW (ref >90)
GLUCOSE: 100 mg/dL (ref 70–140)
Potassium: 4.3 mEq/L (ref 3.5–5.1)
SODIUM: 139 meq/L (ref 136–145)
TOTAL PROTEIN: 6.9 g/dL (ref 6.4–8.3)
Total Bilirubin: <0.3 mg/dL (ref 0.20–1.20)

## 2015-01-12 ENCOUNTER — Telehealth: Payer: Self-pay | Admitting: Internal Medicine

## 2015-01-12 ENCOUNTER — Other Ambulatory Visit: Payer: Self-pay | Admitting: Medical Oncology

## 2015-01-12 ENCOUNTER — Other Ambulatory Visit: Payer: Self-pay | Admitting: Internal Medicine

## 2015-01-12 DIAGNOSIS — C3492 Malignant neoplasm of unspecified part of left bronchus or lung: Secondary | ICD-10-CM

## 2015-01-12 DIAGNOSIS — G4700 Insomnia, unspecified: Secondary | ICD-10-CM

## 2015-01-12 MED ORDER — TEMAZEPAM 30 MG PO CAPS: 30.0000 mg | ORAL_CAPSULE | Freq: Every evening | ORAL | Status: DC | PRN

## 2015-01-12 NOTE — Telephone Encounter (Signed)
Due to LT out moved 12/7 f/u to CB. Both MM/CB ok with move and aware patient has scan. Spoke with patient re change and new time for 12/7 @ 11 am.

## 2015-01-13 ENCOUNTER — Encounter (HOSPITAL_COMMUNITY): Payer: Self-pay

## 2015-01-13 ENCOUNTER — Ambulatory Visit (HOSPITAL_COMMUNITY)
Admission: RE | Admit: 2015-01-13 | Discharge: 2015-01-13 | Disposition: A | Discharge status: Home / Self Care | Payer: Medicare Other | Source: Ambulatory Visit | Attending: Internal Medicine | Admitting: Internal Medicine

## 2015-01-13 DIAGNOSIS — C3492 Malignant neoplasm of unspecified part of left bronchus or lung: Secondary | ICD-10-CM | POA: Insufficient documentation

## 2015-01-13 DIAGNOSIS — M8448XA Pathological fracture, other site, initial encounter for fracture: Secondary | ICD-10-CM | POA: Insufficient documentation

## 2015-01-13 DIAGNOSIS — R918 Other nonspecific abnormal finding of lung field: Secondary | ICD-10-CM | POA: Insufficient documentation

## 2015-01-13 DIAGNOSIS — J90 Pleural effusion, not elsewhere classified: Secondary | ICD-10-CM | POA: Diagnosis not present

## 2015-01-13 DIAGNOSIS — I7090 Unspecified atherosclerosis: Secondary | ICD-10-CM | POA: Diagnosis not present

## 2015-01-13 DIAGNOSIS — Z9221 Personal history of antineoplastic chemotherapy: Secondary | ICD-10-CM | POA: Diagnosis present

## 2015-01-13 DIAGNOSIS — I2510 Atherosclerotic heart disease of native coronary artery without angina pectoris: Secondary | ICD-10-CM | POA: Insufficient documentation

## 2015-01-13 DIAGNOSIS — N83202 Unspecified ovarian cyst, left side: Secondary | ICD-10-CM | POA: Insufficient documentation

## 2015-01-13 DIAGNOSIS — Z5111 Encounter for antineoplastic chemotherapy: Secondary | ICD-10-CM

## 2015-01-13 MED ORDER — IOHEXOL 300 MG/ML  SOLN
100.0000 mL | Freq: Once | INTRAMUSCULAR | Status: AC | PRN
  Administered 2015-01-13: 100 mL via INTRAVENOUS

## 2015-01-14 ENCOUNTER — Telehealth: Payer: Self-pay | Admitting: Nurse Practitioner

## 2015-01-14 ENCOUNTER — Encounter: Payer: Self-pay | Admitting: Nurse Practitioner

## 2015-01-14 ENCOUNTER — Ambulatory Visit (HOSPITAL_BASED_OUTPATIENT_CLINIC_OR_DEPARTMENT_OTHER): Payer: Medicare Other | Admitting: Nurse Practitioner

## 2015-01-14 ENCOUNTER — Ambulatory Visit: Payer: Medicare Other

## 2015-01-14 ENCOUNTER — Telehealth: Payer: Self-pay | Admitting: *Deleted

## 2015-01-14 ENCOUNTER — Other Ambulatory Visit (HOSPITAL_BASED_OUTPATIENT_CLINIC_OR_DEPARTMENT_OTHER): Payer: Medicare Other

## 2015-01-14 VITALS — BP 142/75 | HR 95 | Temp 98.4°F | Resp 19 | Ht 62.0 in | Wt 148.9 lb

## 2015-01-14 DIAGNOSIS — C3412 Malignant neoplasm of upper lobe, left bronchus or lung: Secondary | ICD-10-CM

## 2015-01-14 DIAGNOSIS — C3492 Malignant neoplasm of unspecified part of left bronchus or lung: Secondary | ICD-10-CM

## 2015-01-14 DIAGNOSIS — R5383 Other fatigue: Secondary | ICD-10-CM

## 2015-01-14 LAB — CBC WITH DIFFERENTIAL/PLATELET
BASO%: 0.1 % (ref 0.0–2.0)
BASOS ABS: 0 10*3/uL (ref 0.0–0.1)
EOS%: 0 % (ref 0.0–7.0)
Eosinophils Absolute: 0 10*3/uL (ref 0.0–0.5)
HEMATOCRIT: 34.5 % — AB (ref 34.8–46.6)
HGB: 11.4 g/dL — ABNORMAL LOW (ref 11.6–15.9)
LYMPH#: 1.3 10*3/uL (ref 0.9–3.3)
LYMPH%: 7.8 % — ABNORMAL LOW (ref 14.0–49.7)
MCH: 31.8 pg (ref 25.1–34.0)
MCHC: 33 g/dL (ref 31.5–36.0)
MCV: 96.4 fL (ref 79.5–101.0)
MONO#: 0.9 10*3/uL (ref 0.1–0.9)
MONO%: 5.3 % (ref 0.0–14.0)
NEUT#: 13.9 10*3/uL — ABNORMAL HIGH (ref 1.5–6.5)
NEUT%: 86.8 % — AB (ref 38.4–76.8)
Platelets: 323 10*3/uL (ref 145–400)
RBC: 3.58 10*6/uL — ABNORMAL LOW (ref 3.70–5.45)
RDW: 16 % — ABNORMAL HIGH (ref 11.2–14.5)
WBC: 16 10*3/uL — ABNORMAL HIGH (ref 3.9–10.3)

## 2015-01-14 LAB — COMPREHENSIVE METABOLIC PANEL
ALT: 17 U/L (ref 0–55)
AST: 17 U/L (ref 5–34)
Albumin: 3.3 g/dL — ABNORMAL LOW (ref 3.5–5.0)
Alkaline Phosphatase: 89 U/L (ref 40–150)
Anion Gap: 14 mEq/L — ABNORMAL HIGH (ref 3–11)
BUN: 27.1 mg/dL — AB (ref 7.0–26.0)
CALCIUM: 9.8 mg/dL (ref 8.4–10.4)
CHLORIDE: 96 meq/L — AB (ref 98–109)
CO2: 25 mEq/L (ref 22–29)
Creatinine: 0.9 mg/dL (ref 0.6–1.1)
EGFR: 62 mL/min/{1.73_m2} — AB (ref >90)
Glucose: 114 mg/dl (ref 70–140)
POTASSIUM: 4.2 meq/L (ref 3.5–5.1)
Sodium: 134 mEq/L — ABNORMAL LOW (ref 136–145)
Total Bilirubin: <0.3 mg/dL (ref 0.20–1.20)
Total Protein: 6.8 g/dL (ref 6.4–8.3)

## 2015-01-14 NOTE — Progress Notes (Signed)
SYMPTOM MANAGEMENT CLINIC   HPI: Tammie Clay 69 y.o. female diagnosed with lung cancer.  Currently undergoing docetaxel/Cyramza chemotherapy therapy regimen.   Patient received cycle 6 of her docetaxel/Cyramza chemotherapy on 12/24/2014.  She presented to the [facility] today for review of her restaging scans and to proceed with her next cycle of chemotherapy.  Patient states that she's been doing fairly well recently; with the exception of increased chronic cough, mild shortness of breath, and continued fatigue.  She denies any recent fevers or chills.  Labs obtained today reveal a WBC of 16.0, ANC 13.9, hemoglobin 11.4, and platelet count 323.  Restaging scans obtained on 01/13/2015 revealed:  IMPRESSION: 1. 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 2. No signs of metastatic disease to the abdomen or pelvis. 3. Atherosclerosis, including left main and 3 vessel coronary artery disease. 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 4. Unchanged left ovarian cyst, as above. Stability over numerous prior examinations suggests a benign lesion. 5. Additional incidental findings, similar to prior examinations, as Above.  Dr. [name] in to review scan results with both patient and her husband in detail.  The plan is to discontinue her previous chemotherapy regimen; and  to start carboplatin/gemcitabine chemotherapy next week.  This plan consist of receiving both carboplatin and gemcitabine on day 1; and receiving the gemcitabine only portion of the chemotherapy on day 8 of each 21 day cycle.  Patient will return next Wednesday, 01/21/2015 to receive cycle 1, day 1 of her new chemotherapy regimen.  She will return on 01/28/2015 for labs, visit, and chemotherapy as well.  HPI  ROS  Past Medical History  Diagnosis Date  . Hypertension   . COPD (chronic obstructive pulmonary disease) ([place])   . Hyperlipidemia   . GERD (gastroesophageal reflux disease)   . Osteopenia   . Insomnia   . Mediastinal lymphadenopathy 11/19/13    PER CT  . Multiple lung nodules on CT 11/19/13    LEFT LUNG BASE  . Lung mass 11/19/13    RUL PER CT  . Coronary artery calcification seen on CAT scan 11/21/2013  . COPD (chronic obstructive pulmonary disease) ([place]) 11/21/2013  . Pneumonia     hx of  . Anxiety   . stage iv sq cell lung ca dx'd 11/2013    Recist   . Encounter for antineoplastic chemotherapy 12/03/2014    Past Surgical History  Procedure Laterality Date  .  c section x 2    . Urethral cyst removed    . Tonsillectomy    . Appendectomy    . Colonoscopy with propofol N/A 07/17/2012    Procedure: COLONOSCOPY WITH PROPOFOL;  Surgeon: Garlan Fair, MD;  Location: [facility];  Service: Endoscopy;  Laterality: N/A;  . Esophagogastroduodenoscopy (egd) with propofol N/A 07/17/2012    Procedure: ESOPHAGOGASTRODUODENOSCOPY (EGD) WITH PROPOFOL;  Surgeon: Garlan Fair, MD;  Location: [facility];  Service: Endoscopy;  Laterality: N/A;  . Video bronchoscopy  with endobronchial ultrasound N/A 12/03/2013    Procedure: VIDEO BRONCHOSCOPY WITH ENDOBRONCHIAL ULTRASOUND WITH BIOPSIES;  Surgeon: Grace Isaac, MD;  Location: [place];  Service: Thoracic;  Laterality: N/A;  . Portacath placement Left 12/10/2013    Procedure: INSERTION PORT-A-CATH;  Surgeon: Grace Isaac, MD;  Location: [place];  Service: Thoracic;  Laterality: Left;    has Hyperlipidemia; GERD (gastroesophageal reflux disease); Osteopenia; Mediastinal lymphadenopathy; Coronary artery calcification seen on CAT scan; COPD (chronic obstructive pulmonary disease) ([place]); Stage IV squamous cell carcinoma of left lung ([place]); Neoplasm related pain; Insomnia; Antineoplastic chemotherapy induced anemia; Chemotherapy-induced neuropathy ([place]); Hypomagnesemia; Hyperthyroidism; Encounter for antineoplastic immunotherapy; and Encounter for antineoplastic chemotherapy on her problem list.    has No Known Allergies.    Medication List       This list is accurate as of: 01/14/15  5:22 PM.  Always use your most recent med list.               acetaminophen 325 MG tablet  Commonly known as:  TYLENOL  Take 650 mg by mouth daily as needed for moderate pain or headache. Take 2 tablets every morning     albuterol 108 (90 BASE) MCG/ACT inhaler  Commonly known as:  PROVENTIL HFA;VENTOLIN HFA  Inhale 2 puffs into the lungs every 6 (six) hours as needed for wheezing.     calcium carbonate 500 MG chewable tablet  Commonly known as:  TUMS - dosed in mg elemental calcium  Chew 1 tablet by mouth daily as needed for indigestion or heartburn.     dexamethasone 4 MG tablet  Commonly known as:  DECADRON  2 tablets by mouth twice a day, the day before, day of and day after the chemotherapy every 3 weeks     diphenoxylate-atropine 2.5-0.025 MG tablet  Commonly known as:  LOMOTIL  TAKE 2 TABLETS 4 TIMES A DAY AS NEEDED FOR DIARRHEA     docusate sodium 100 MG capsule  Commonly known as:  COLACE  Take 100 mg by mouth 2 (two) times daily as needed for moderate constipation.     DULoxetine 30 MG capsule  Commonly known as:  CYMBALTA  TAKE ONE CAPSULE BY MOUTH EVERY DAY     HYDROcodone-acetaminophen 10-325 MG tablet  Commonly known as:  NORCO  Take 1 tablet by mouth every 6 (six) hours as needed for  moderate pain or severe pain.     lidocaine-prilocaine cream  Commonly known as:  EMLA  Apply 1 application topically as needed.     lisinopril-hydrochlorothiazide 20-12.5 MG tablet  Commonly known as:  PRINZIDE,ZESTORETIC  Take 2 tablets by mouth every morning.     loperamide 2 MG capsule  Commonly known as:  IMODIUM  Take by mouth as needed for diarrhea or loose stools.     loratadine 10 MG tablet  Commonly known as:  CLARITIN  Take 10 mg by mouth daily.     methimazole 10 MG tablet  Commonly known as:  TAPAZOLE  Take 5 mg by mouth 2 (two) times daily.     metoprolol succinate 50 MG 24 hr tablet  Commonly known as:  TOPROL-XL  Take 50 mg by mouth every morning. Take with or immediately following a meal.     multivitamin with minerals Tabs tablet  Take 1 tablet by mouth daily.     polyethylene glycol packet  Commonly known as:  MIRALAX / GLYCOLAX  Take 17 g by mouth daily as needed for moderate constipation  or severe constipation.     prochlorperazine 10 MG tablet  Commonly known as:  COMPAZINE  Take 1 tablet (10 mg total) by mouth every 6 (six) hours as needed for nausea or vomiting.     ranitidine 150 MG tablet  Commonly known as:  ZANTAC  Take 150 mg by mouth 2 (two) times daily as needed for heartburn.     SYMBICORT 80-4.5 MCG/ACT inhaler  Generic drug:  budesonide-formoterol  Inhale 2 puffs into the lungs 2 (two) times daily.     temazepam 30 MG capsule  Commonly known as:  RESTORIL  Take 1 capsule (30 mg total) by mouth at bedtime as needed for sleep.         PHYSICAL EXAMINATION  Oncology Vitals 01/14/2015 12/26/2014  Height 158 cm -  Weight 67.541 kg -  Weight (lbs) 148 lbs 14 oz -  BMI (kg/m2) 27.23 kg/m2 -  Temp 98.4 97.8  Pulse 95 77  Resp 19 18  SpO2 96 -  BSA (m2) 1.72 m2 -   BP Readings from Last 2 Encounters:  01/14/15 142/75  12/26/14 148/80    Physical Exam  Constitutional: She is oriented to person, place, and time and  well-developed, well-nourished, and in no distress.  HENT:  Head: Normocephalic and atraumatic.  Eyes: Conjunctivae and EOM are normal. Pupils are equal, round, and reactive to light. Right eye exhibits no discharge. Left eye exhibits no discharge. No scleral icterus.  Neck: Normal range of motion. Neck supple. No JVD present. No tracheal deviation present. No thyromegaly present.  Cardiovascular: Normal rate, regular rhythm, normal heart sounds and intact distal pulses.   Pulmonary/Chest: Effort normal and breath sounds normal. No respiratory distress. She has no wheezes. She has no rales. She exhibits no tenderness.  Abdominal: Soft. Bowel sounds are normal. She exhibits no distension and no mass. There is no tenderness. There is no rebound and no guarding.  Musculoskeletal: Normal range of motion. She exhibits no edema or tenderness.  Lymphadenopathy:    She has no cervical adenopathy.  Neurological: She is alert and oriented to person, place, and time. Gait normal.  Skin: Skin is warm and dry. No rash noted. No erythema. No pallor.  Psychiatric: Affect normal.  Nursing note and vitals reviewed.   LABORATORY DATA:. Appointment on 01/14/2015  Component Date Value Ref Range Status  . WBC 01/14/2015 16.0* 3.9 - 10.3 10e3/uL Final  . NEUT# 01/14/2015 13.9* 1.5 - 6.5 10e3/uL Final  . HGB 01/14/2015 11.4* 11.6 - 15.9 g/dL Final  . HCT 01/14/2015 34.5* 34.8 - 46.6 % Final  . Platelets 01/14/2015 323  145 - 400 10e3/uL Final  . MCV 01/14/2015 96.4  79.5 - 101.0 fL Final  . MCH 01/14/2015 31.8  25.1 - 34.0 pg Final  . MCHC 01/14/2015 33.0  31.5 - 36.0 g/dL Final  . RBC 01/14/2015 3.58* 3.70 - 5.45 10e6/uL Final  . RDW 01/14/2015 16.0* 11.2 - 14.5 % Final  . lymph# 01/14/2015 1.3  0.9 - 3.3 10e3/uL Final  . MONO# 01/14/2015 0.9  0.1 - 0.9 10e3/uL Final  . Eosinophils Absolute 01/14/2015 0.0  0.0 - 0.5 10e3/uL Final  . Basophils Absolute 01/14/2015 0.0  0.0 - 0.1 10e3/uL Final  . NEUT%  01/14/2015 86.8* 38.4 - 76.8 % Final  . LYMPH% 01/14/2015 7.8* 14.0 - 49.7 % Final  . MONO% 01/14/2015 5.3  0.0 - 14.0 % Final  . EOS% 01/14/2015 0.0  0.0 - 7.0 % Final  . BASO%  01/14/2015 0.1  0.0 - 2.0 % Final  . Sodium 01/14/2015 134* 136 - 145 mEq/L Final  . Potassium 01/14/2015 4.2  3.5 - 5.1 mEq/L Final  . Chloride 01/14/2015 96* 98 - 109 mEq/L Final  . CO2 01/14/2015 25  22 - 29 mEq/L Final  . Glucose 01/14/2015 114  70 - 140 mg/dl Final   Glucose reference range is for nonfasting patients. Fasting glucose reference range is 70- 100.  Marland Kitchen BUN 01/14/2015 27.1* 7.0 - 26.0 mg/dL Final  . Creatinine 01/14/2015 0.9  0.6 - 1.1 mg/dL Final  . Total Bilirubin 01/14/2015 <0.30  0.20 - 1.20 mg/dL Final  . Alkaline Phosphatase 01/14/2015 89  40 - 150 U/L Final  . AST 01/14/2015 17  5 - 34 U/L Final  . ALT 01/14/2015 17  0 - 55 U/L Final  . Total Protein 01/14/2015 6.8  6.4 - 8.3 g/dL Final  . Albumin 01/14/2015 3.3* 3.5 - 5.0 g/dL Final  . Calcium 01/14/2015 9.8  8.4 - 10.4 mg/dL Final  . Anion Gap 01/14/2015 14* 3 - 11 mEq/L Final  . EGFR 01/14/2015 62* >90 ml/min/1.73 m2 Final   eGFR is calculated using the CKD-EPI Creatinine Equation (2009)     RADIOGRAPHIC STUDIES: Ct Chest W Contrast  01/13/2015  CLINICAL DATA:  Subsequent evaluation of a 69 year old female with history of stage IV squamous cell lung cancer diagnosed in 2015. Chemotherapy ongoing. Shortness of breath for the past 3 weeks. EXAM: CT CHEST, ABDOMEN, AND PELVIS WITH CONTRAST TECHNIQUE: Multidetector CT imaging of the chest, abdomen and pelvis was performed following the standard protocol during bolus administration of intravenous contrast. CONTRAST:  163m OMNIPAQUE IOHEXOL 300 MG/ML  SOLN COMPARISON:  CT of the chest, abdomen and pelvis 11/10/2014. FINDINGS: CT CHEST FINDINGS Mediastinum/Nodes: Heart size is normal. There is no significant pericardial fluid, thickening or pericardial calcification. There is atherosclerosis of  the thoracic aorta, the great vessels of the mediastinum and the coronary arteries, including calcified atherosclerotic plaque in the left main, left anterior descending left circumflex and right coronary arteries. Progression of previously noted left hilar and mediastinal lymphadenopathy. Specifically, there is a mildly enlarged left hilar lymph node measuring 1 cm in short axis (previously nonenlarged), interval enlargement of a 2.1 cm short axis subcarinal lymph node (previously 18 mm), unchanged 18 mm short axis low right paratracheal lymph node, and slightly enlarged 14 mm short axis high right paratracheal lymph node (previously 12 mm). Low paraesophageal lymph node also slightly increased currently measuring 13 mm in short axis (previously 11 mm). No axillary or supraclavicular lymphadenopathy. Left-sided subclavian single-lumen porta cath with tip terminating at the superior cavoatrial junction. Lungs/Pleura: Interval enlargement of the previously noted left upper lobe mass in the medial aspect of the left upper lobe (image 26 of series 2), which currently measures 6.6 x 4.3 cm and makes a broad contact with the overlying pleura medially. There is also some lateral spiculations with retraction of the lateral pleura and smile pleural thickening and nodularity. New small left pleural effusion lying dependently. Multiple new tiny pulmonary nodules scattered throughout the lungs bilaterally, most evident in the right lower lobe on images 39 and 40 where there are multiple 3-5 mm pulmonary nodules. New 6 mm right lower lobe nodule on image 40 of series 4. Some previously noted pulmonary nodules have resolved, including the 6 mm nodules in the right lung base on image 46 of series 4 of prior examination 11/10/2014. However, other previously noted pulmonary nodules appear  slightly enlarged compared to the prior study, including a 8 mm ground-glass attenuation nodule in the left lower lobe (image 31 of series 4),  which previously measured only 6 mm. Likewise, a spiculated 8 mm left upper lobe nodule (image 18 of series 4) previously measured only 6 mm. Mild diffuse bronchial wall thickening with moderate centrilobular emphysema. Musculoskeletal: Expansile lytic lesion in the lateral aspect of the left sixth rib with nondisplaced pathologic fracture (image 27 of series 4). CT ABDOMEN PELVIS FINDINGS Hepatobiliary: With several sub cm well-defined low-attenuation lesions are again noted in the liver, too small to definitively characterize, but similar to prior studies, favored to represent tiny cysts. No other suspicious appearing hepatic lesions are noted. No intra or extrahepatic biliary ductal dilatation. Gallbladder is normal in appearance. Pancreas: No pancreatic ductal dilatation. No pancreatic or peripancreatic fluid or inflammatory changes. 5 mm fatty attenuation lesion in the tail of the pancreas is unchanged, compatible with a tiny lipoma. No other larger pancreatic mass. Spleen: Unremarkable. Adrenals/Urinary Tract: Bilateral adrenal glands are normal in appearance. Altered right renal axis again noted (normal anatomical variant). Sub cm low-attenuation lesions in the kidneys bilaterally, too small to definitively characterize, but similar to the prior study, presumably tiny cysts. No other aggressive appearing renal lesions. No hydroureteronephrosis. Urinary bladder is unremarkable in appearance. Stomach/Bowel: Stomach is normal in appearance. No pathologic dilatation of small bowel or colon. Appendix is not confidently identified may be surgically absent. Regardless, no inflammatory changes are noted adjacent to the cecum to suggest the presence of an acute appendicitis at this time. Vascular/Lymphatic: Extensive atherosclerosis throughout the abdominal and pelvic vasculature including fusiform ectasia of the infrarenal abdominal aorta which measures up to 2.9 x 2.7 cm. No lymphadenopathy noted in the abdomen or  pelvis. Reproductive: Uterus is slightly heterogeneous in appearance with multiple small calcifications, presumably tiny chronic calcified fibroids. In the left side of the fundal portion of the uterus there is a 2.9 x 2.6 x 2.6 cm lesion, compatible with a fibroid. 4.4 x 3.7 x 4.4 cm low-attenuation lesion in the left ovary is similar to the prior study, presumably an ovarian cyst. Right ovary is atrophic. Other: No significant volume of ascites.  No pneumoperitoneum. Musculoskeletal: Small lipoma measuring 2.0 x 2.4 cm in the inferior aspect of the left quadratus lumborum muscle belly anterior to the left femoral back. There are no aggressive appearing lytic or blastic lesions noted in the visualized portions of the skeleton. IMPRESSION: 1. 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 2. No signs of metastatic disease to the abdomen or pelvis. 3. Atherosclerosis, including left main and 3 vessel coronary artery disease. 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 4. Unchanged left ovarian cyst, as above. Stability over numerous prior examinations suggests a benign lesion. 5. Additional incidental findings, similar to prior examinations, as above. Electronically Signed   By: Vinnie Langton M.D.   On: 01/13/2015 09:51   Ct Abdomen Pelvis W Contrast  01/13/2015  CLINICAL DATA:  Subsequent evaluation of a  69 year old female with history of stage IV squamous cell lung cancer diagnosed in 2015. Chemotherapy ongoing. Shortness of breath for the past 3 weeks. EXAM: CT CHEST, ABDOMEN, AND PELVIS WITH CONTRAST TECHNIQUE: Multidetector CT imaging of the chest, abdomen and pelvis was performed following the standard protocol during bolus administration of intravenous contrast. CONTRAST:  111m OMNIPAQUE IOHEXOL 300 MG/ML  SOLN COMPARISON:  CT of the chest, abdomen and pelvis 11/10/2014. FINDINGS: CT CHEST FINDINGS Mediastinum/Nodes: Heart size is normal. There is no significant pericardial fluid, thickening or pericardial calcification. There is atherosclerosis of the thoracic aorta, the great vessels of the mediastinum and the coronary arteries, including calcified atherosclerotic plaque in the left main, left anterior descending left circumflex and right coronary arteries. Progression of previously noted left hilar and mediastinal lymphadenopathy. Specifically, there is a mildly enlarged left hilar lymph node measuring 1 cm in short axis (previously nonenlarged), interval enlargement of a 2.1 cm short axis subcarinal lymph node (previously 18 mm), unchanged 18 mm short axis low right paratracheal lymph node, and slightly enlarged 14 mm short axis high right paratracheal lymph node (previously 12 mm). Low paraesophageal lymph node also slightly increased currently measuring 13 mm in short axis (previously 11 mm). No axillary or supraclavicular lymphadenopathy. Left-sided subclavian single-lumen porta cath with tip terminating at the superior cavoatrial junction. Lungs/Pleura: Interval enlargement of the previously noted left upper lobe mass in the medial aspect of the left upper lobe (image 26 of series 2), which currently measures 6.6 x 4.3 cm and makes a broad contact with the overlying pleura medially. There is also some lateral spiculations with retraction of the lateral pleura and smile pleural thickening and  nodularity. New small left pleural effusion lying dependently. Multiple new tiny pulmonary nodules scattered throughout the lungs bilaterally, most evident in the right lower lobe on images 39 and 40 where there are multiple 3-5 mm pulmonary nodules. New 6 mm right lower lobe nodule on image 40 of series 4. Some previously noted pulmonary nodules have resolved, including the 6 mm nodules in the right lung base on image 46 of series 4 of prior examination 11/10/2014. However, other previously noted pulmonary nodules appear slightly enlarged compared to the prior study, including a 8 mm ground-glass attenuation nodule in the left lower lobe (image 31 of series 4), which previously measured only 6 mm. Likewise, a spiculated 8 mm left upper lobe nodule (image 18 of series 4) previously measured only 6 mm. Mild diffuse bronchial wall thickening with moderate centrilobular emphysema. Musculoskeletal: Expansile lytic lesion in the lateral aspect of the left sixth rib with nondisplaced pathologic fracture (image 27 of series 4). CT ABDOMEN PELVIS FINDINGS Hepatobiliary: With several sub cm well-defined low-attenuation lesions are again noted in the liver, too small to definitively characterize, but similar to prior studies, favored to represent tiny cysts. No other suspicious appearing hepatic lesions are noted. No intra or extrahepatic biliary ductal dilatation. Gallbladder is normal in appearance. Pancreas: No pancreatic ductal dilatation. No pancreatic or peripancreatic fluid or inflammatory changes. 5 mm fatty attenuation lesion in the tail of the pancreas is unchanged, compatible with a tiny lipoma. No other larger pancreatic mass. Spleen: Unremarkable. Adrenals/Urinary Tract: Bilateral adrenal glands are normal in appearance. Altered right renal axis again noted (normal anatomical variant). Sub cm low-attenuation lesions in the kidneys bilaterally, too small to definitively characterize, but similar to the prior  study, presumably tiny cysts. No other aggressive appearing renal lesions. No hydroureteronephrosis.  Urinary bladder is unremarkable in appearance. Stomach/Bowel: Stomach is normal in appearance. No pathologic dilatation of small bowel or colon. Appendix is not confidently identified may be surgically absent. Regardless, no inflammatory changes are noted adjacent to the cecum to suggest the presence of an acute appendicitis at this time. Vascular/Lymphatic: Extensive atherosclerosis throughout the abdominal and pelvic vasculature including fusiform ectasia of the infrarenal abdominal aorta which measures up to 2.9 x 2.7 cm. No lymphadenopathy noted in the abdomen or pelvis. Reproductive: Uterus is slightly heterogeneous in appearance with multiple small calcifications, presumably tiny chronic calcified fibroids. In the left side of the fundal portion of the uterus there is a 2.9 x 2.6 x 2.6 cm lesion, compatible with a fibroid. 4.4 x 3.7 x 4.4 cm low-attenuation lesion in the left ovary is similar to the prior study, presumably an ovarian cyst. Right ovary is atrophic. Other: No significant volume of ascites.  No pneumoperitoneum. Musculoskeletal: Small lipoma measuring 2.0 x 2.4 cm in the inferior aspect of the left quadratus lumborum muscle belly anterior to the left femoral back. There are no aggressive appearing lytic or blastic lesions noted in the visualized portions of the skeleton. IMPRESSION: 1. Today's study demonstrates progression of disease, as evidenced by interval enlargement of what is now a 6.6 x 4.3 cm pleural based mass in the medial aspect of the left upper lobe, with generally increased number and size of numerous small pulmonary nodules scattered throughout the lungs bilaterally, concerning for progressive metastatic disease, increased left hilar and mediastinal adenopathy, developing pleural thickening/nodularity and small left pleural effusion, concerning for a malignant pleural effusion, and  nondisplaced pathologic fracture of the lateral aspect of the left sixth rib. 2. No signs of metastatic disease to the abdomen or pelvis. 3. Atherosclerosis, including left main and 3 vessel coronary artery disease. 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 4. Unchanged left ovarian cyst, as above. Stability over numerous prior examinations suggests a benign lesion. 5. Additional incidental findings, similar to prior examinations, as above. Electronically Signed   By: Vinnie Langton M.D.   On: 01/13/2015 09:51    ASSESSMENT/PLAN:    Stage IV squamous cell carcinoma of left lung Patient received cycle 6 of her docetaxel/Cyramza chemotherapy on 12/24/2014.  She presented to the [facility] today for review of her restaging scans and to proceed with her next cycle of chemotherapy.  Patient states that she's been doing fairly well recently; with the exception of increased chronic cough, mild shortness of breath, and continued fatigue.  She denies any recent fevers or chills.  Labs obtained today reveal a WBC of 16.0, ANC 13.9, hemoglobin 11.4, and platelet count 323.  Restaging scans obtained on 01/13/2015 revealed:  IMPRESSION: 1. 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 2. No signs of metastatic disease to the abdomen or pelvis. 3. Atherosclerosis, including left main and 3  vessel coronary artery disease. 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 4. Unchanged left ovarian cyst, as above. Stability over numerous prior examinations suggests a benign lesion. 5. Additional incidental findings, similar to prior examinations, as Above.  Dr. [name] in to review scan results with both patient and her husband in detail.  The plan is to discontinue her previous chemotherapy regimen; and to start carboplatin/gemcitabine chemotherapy next week.  This plan consist of receiving both carboplatin and gemcitabine on day 1; and receiving the gemcitabine only portion of the chemotherapy on day 8 of each 21 day cycle.  Patient will return next Wednesday, 01/21/2015 to receive cycle 1, day 1 of her new chemotherapy regimen.  She will return on 01/28/2015 for labs, visit, and chemotherapy as well.  Patient stated understanding of all instructions; and was in agreement with this plan of care. The patient knows to call the clinic with any problems, questions or concerns.   This is a shared visit with Dr. [name] today.  Total time spent with patient was 25 minutes;  with greater than 75 percent of that time spent in face to face counseling regarding patient's symptoms,  and coordination of care and follow up.  Disclaimer:This dictation was prepared with Dragon/digital dictation along with [employer]. Any transcriptional errors that result from this process are unintentional.  Drue Second, NP 01/14/2015   ADDENDUM: Hematology/Oncology Attending: I had a face to face encounter with the patient. I recommended her care plan. This is a very pleasant 69 years old white female with metastatic non-small cell lung cancer, squamous cell carcinoma diagnosed in October 2015 status post several chemotherapy regimens and most recently completed 6 cycles of systemic chemotherapy with docetaxel and Cyramza. The patient  related the last cycle of her treatment well except for mild fatigue. Unfortunately the recent CT scan of the chest, abdomen and pelvis showed evidence for disease progression. I discussed the scan results and showed the images to the patient and her husband. I recommended for the patient to discontinue her current treatment with docetaxel and Cyramza. I discussed with the patient several options for treatment of her condition including palliative care and hospice referral versus palliative treatment with systemic chemotherapy with carboplatin for AUC of 5 on day 1 and gemcitabine 1000 MG/M2 on days 1 and 8 every 3 weeks. I discussed with the patient adverse effect of this treatment including mild alopecia, myelosuppression, nausea and vomiting, peripheral neuropathy, liver or renal dysfunction. The patient is interested in proceeding with systemic chemotherapy. The patient would like to proceed with treatment as planned and she is expected to start the first dose of this treatment next week. She was advised to call immediately if she has any concerning symptoms in the interval.   Disclaimer: This note was dictated with voice recognition software. Similar sounding words can inadvertently be transcribed and may be missed upon review. Eilleen Kempf., MD 01/17/2015

## 2015-01-14 NOTE — Telephone Encounter (Signed)
Per staff message and POF I have scheduled appts. Advised scheduler of appts. JMW  

## 2015-01-14 NOTE — Telephone Encounter (Signed)
per pfo to sch pt appt-gave pt copy of avs-sent MW email to sch trmt-opt to look on MY CHART for updated copy

## 2015-01-14 NOTE — Assessment & Plan Note (Signed)
Patient received cycle 6 of her docetaxel/Cyramza chemotherapy on 12/24/2014.  She presented to the [facility] today for review of her restaging scans and to proceed with her next cycle of chemotherapy.  Patient states that she's been doing fairly well recently; with the exception of increased chronic cough, mild shortness of breath, and continued fatigue.  She denies any recent fevers or chills.  Labs obtained today reveal a WBC of 16.0, ANC 13.9, hemoglobin 11.4, and platelet count 323.  Restaging scans obtained on 01/13/2015 revealed:  IMPRESSION: 1. 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 2. No signs of metastatic disease to the abdomen or pelvis. 3. Atherosclerosis, including left main and 3 vessel coronary artery disease. 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 4. Unchanged left ovarian cyst, as above. Stability over numerous prior examinations suggests a benign lesion. 5. Additional incidental findings, similar to prior examinations, as Above.  Dr. [name] in to review scan results with both patient and her husband in detail.  The plan is to discontinue her previous chemotherapy regimen; and to start carboplatin/gemcitabine chemotherapy next week.  This plan consist of receiving both carboplatin and gemcitabine on day 1; and receiving the gemcitabine only portion  of the chemotherapy on day 8 of each 21 day cycle.  Patient will return next Wednesday, 01/21/2015 to receive cycle 1, day 1 of her new chemotherapy regimen.  She will return on 01/28/2015 for labs, visit, and chemotherapy as well.

## 2015-01-14 NOTE — Telephone Encounter (Signed)
perp of to remove labs and keep sch appts-cld pt and left a message of time & date of next appt

## 2015-01-15 ENCOUNTER — Ambulatory Visit: Payer: Medicare Other

## 2015-01-21 ENCOUNTER — Other Ambulatory Visit: Payer: Medicare Other

## 2015-01-21 ENCOUNTER — Ambulatory Visit (HOSPITAL_BASED_OUTPATIENT_CLINIC_OR_DEPARTMENT_OTHER): Payer: Medicare Other

## 2015-01-21 VITALS — BP 114/65 | HR 97 | Temp 97.9°F | Resp 19

## 2015-01-21 DIAGNOSIS — C3492 Malignant neoplasm of unspecified part of left bronchus or lung: Secondary | ICD-10-CM

## 2015-01-21 DIAGNOSIS — C3412 Malignant neoplasm of upper lobe, left bronchus or lung: Secondary | ICD-10-CM | POA: Diagnosis not present

## 2015-01-21 DIAGNOSIS — Z5111 Encounter for antineoplastic chemotherapy: Secondary | ICD-10-CM | POA: Diagnosis not present

## 2015-01-21 MED ORDER — SODIUM CHLORIDE 0.9 % IV SOLN
Freq: Once | INTRAVENOUS | Status: AC
  Administered 2015-01-21: 13:00:00 via INTRAVENOUS

## 2015-01-21 MED ORDER — SODIUM CHLORIDE 0.9 % IJ SOLN
10.0000 mL | INTRAMUSCULAR | Status: DC | PRN
  Administered 2015-01-21: 10 mL
  Filled 2015-01-21: qty 10

## 2015-01-21 MED ORDER — SODIUM CHLORIDE 0.9 % IV SOLN
1000.0000 mg/m2 | Freq: Once | INTRAVENOUS | Status: AC
  Administered 2015-01-21: 1710 mg via INTRAVENOUS
  Filled 2015-01-21: qty 44.97

## 2015-01-21 MED ORDER — SODIUM CHLORIDE 0.9 % IV SOLN
Freq: Once | INTRAVENOUS | Status: AC
  Administered 2015-01-21: 14:00:00 via INTRAVENOUS
  Filled 2015-01-21: qty 8

## 2015-01-21 MED ORDER — SODIUM CHLORIDE 0.9 % IV SOLN
400.0000 mg | Freq: Once | INTRAVENOUS | Status: AC
  Administered 2015-01-21: 400 mg via INTRAVENOUS
  Filled 2015-01-21: qty 40

## 2015-01-21 MED ORDER — HEPARIN SOD (PORK) LOCK FLUSH 100 UNIT/ML IV SOLN
500.0000 [IU] | Freq: Once | INTRAVENOUS | Status: AC | PRN
  Administered 2015-01-21: 500 [IU]
  Filled 2015-01-21: qty 5

## 2015-01-21 MED ORDER — CARBOPLATIN CHEMO INTRADERMAL TEST DOSE 100MCG/0.02ML
100.0000 ug | Freq: Once | INTRADERMAL | Status: AC
  Administered 2015-01-21: 100 ug via INTRADERMAL
  Filled 2015-01-21: qty 0.01

## 2015-01-21 NOTE — Progress Notes (Signed)
Pt reports that the Tapazole was not working for her and that her PCP changed her to Ambien on Monday of this week and it helped her sleep but she wakes up everyday at 4am and would like to discuss different options along with wanting to know "what would the next course of action be if this treatment does not work". Dr. [name] aware. Pt to give Ambien a few more days to work and call clinic or PCP for further options if Ambien continues to not work, and different treatment options would be discussed at a later time . Pt aware and verbalizes understanding. Reviewed labs from 01/14/15 with Dr. [name], okay to proceed with treatment using these labs, no new orders at this time per Dr. [name].  Per Pharmacy, Carbo skin test added and although approx 3 minutes of pre-meds had been given, okay to proceed with skin test. Pre-meds paused and skin test given with no reaction noted.

## 2015-01-21 NOTE — Patient Instructions (Signed)
[facility] Discharge Instructions for Patients Receiving Chemotherapy  Today you received the following chemotherapy agents: Gemzar and carboplatin   To help prevent nausea and vomiting after your treatment, we encourage you to take your nausea medication as directed.    If you develop nausea and vomiting that is not controlled by your nausea medication, call the clinic.   BELOW ARE SYMPTOMS THAT SHOULD BE REPORTED IMMEDIATELY:  *FEVER GREATER THAN 100.5 F  *CHILLS WITH OR WITHOUT FEVER  NAUSEA AND VOMITING THAT IS NOT CONTROLLED WITH YOUR NAUSEA MEDICATION  *UNUSUAL SHORTNESS OF BREATH  *UNUSUAL BRUISING OR BLEEDING  TENDERNESS IN MOUTH AND THROAT WITH OR WITHOUT PRESENCE OF ULCERS  *URINARY PROBLEMS  *BOWEL PROBLEMS  UNUSUAL RASH Items with * indicate a potential emergency and should be followed up as soon as possible.  Feel free to call the clinic you have any questions or concerns. The clinic phone number is [phone].  Please show the [facility] at check-in to the Emergency Department and triage nurse.    Gemcitabine injection What is this medicine? GEMCITABINE (jem SIT a been) is a chemotherapy drug. This medicine is used to treat many types of cancer like breast cancer, lung cancer, pancreatic cancer, and ovarian cancer. This medicine may be used for other purposes; ask your health care provider or pharmacist if you have questions. What should I tell my health care provider before I take this medicine? They need to know if you have any of these conditions: -blood disorders -infection -kidney disease -liver disease -recent or ongoing radiation therapy -an unusual or allergic reaction to gemcitabine, other chemotherapy, other medicines, foods, dyes, or preservatives -pregnant or trying to get pregnant -breast-feeding How should I use this medicine? This drug is given as an infusion into a vein. It is administered in a hospital or  clinic by a specially trained health care professional. Talk to your pediatrician regarding the use of this medicine in children. Special care may be needed. Overdosage: If you think you have taken too much of this medicine contact a poison control center or emergency room at once. NOTE: This medicine is only for you. Do not share this medicine with others. What if I miss a dose? It is important not to miss your dose. Call your doctor or health care professional if you are unable to keep an appointment. What may interact with this medicine? -medicines to increase blood counts like filgrastim, pegfilgrastim, sargramostim -some other chemotherapy drugs like cisplatin -vaccines Talk to your doctor or health care professional before taking any of these medicines: -acetaminophen -aspirin -ibuprofen -ketoprofen -naproxen This list may not describe all possible interactions. Give your health care provider a list of all the medicines, herbs, non-prescription drugs, or dietary supplements you use. Also tell them if you smoke, drink alcohol, or use illegal drugs. Some items may interact with your medicine. What should I watch for while using this medicine? Visit your doctor for checks on your progress. This drug may make you feel generally unwell. This is not uncommon, as chemotherapy can affect healthy cells as well as cancer cells. Report any side effects. Continue your course of treatment even though you feel ill unless your doctor tells you to stop. In some cases, you may be given additional medicines to help with side effects. Follow all directions for their use. Call your doctor or health care professional for advice if you get a fever, chills or sore throat, or other symptoms of a cold  or flu. Do not treat yourself. This drug decreases your body's ability to fight infections. Try to avoid being around people who are sick. This medicine may increase your risk to bruise or bleed. Call your doctor or  health care professional if you notice any unusual bleeding. Be careful brushing and flossing your teeth or using a toothpick because you may get an infection or bleed more easily. If you have any dental work done, tell your dentist you are receiving this medicine. Avoid taking products that contain aspirin, acetaminophen, ibuprofen, naproxen, or ketoprofen unless instructed by your doctor. These medicines may hide a fever. Women should inform their doctor if they wish to become pregnant or think they might be pregnant. There is a potential for serious side effects to an unborn child. Talk to your health care professional or pharmacist for more information. Do not breast-feed an infant while taking this medicine. What side effects may I notice from receiving this medicine? Side effects that you should report to your doctor or health care professional as soon as possible: -allergic reactions like skin rash, itching or hives, swelling of the face, lips, or tongue -low blood counts - this medicine may decrease the number of white blood cells, red blood cells and platelets. You may be at increased risk for infections and bleeding. -signs of infection - fever or chills, cough, sore throat, pain or difficulty passing urine -signs of decreased platelets or bleeding - bruising, pinpoint red spots on the skin, black, tarry stools, blood in the urine -signs of decreased red blood cells - unusually weak or tired, fainting spells, lightheadedness -breathing problems -chest pain -mouth sores -nausea and vomiting -pain, swelling, redness at site where injected -pain, tingling, numbness in the hands or feet -stomach pain -swelling of ankles, feet, hands -unusual bleeding Side effects that usually do not require medical attention (report to your doctor or health care professional if they continue or are bothersome): -constipation -diarrhea -hair loss -loss of appetite -stomach upset This list may not  describe all possible side effects. Call your doctor for medical advice about side effects. You may report side effects to FDA at 1-800-FDA-1088. Where should I keep my medicine? This drug is given in a hospital or clinic and will not be stored at home. NOTE: This sheet is a summary. It may not cover all possible information. If you have questions about this medicine, talk to your doctor, pharmacist, or health care provider.    2016, Elsevier/Gold Standard. (2007-06-05 18:45:54)   Carboplatin injection What is this medicine? CARBOPLATIN (KAR boe pla tin) is a chemotherapy drug. It targets fast dividing cells, like cancer cells, and causes these cells to die. This medicine is used to treat ovarian cancer and many other cancers. This medicine may be used for other purposes; ask your health care provider or pharmacist if you have questions. What should I tell my health care provider before I take this medicine? They need to know if you have any of these conditions: -blood disorders -hearing problems -kidney disease -recent or ongoing radiation therapy -an unusual or allergic reaction to carboplatin, cisplatin, other chemotherapy, other medicines, foods, dyes, or preservatives -pregnant or trying to get pregnant -breast-feeding How should I use this medicine? This drug is usually given as an infusion into a vein. It is administered in a hospital or clinic by a specially trained health care professional. Talk to your pediatrician regarding the use of this medicine in children. Special care may be needed. Overdosage: If you  think you have taken too much of this medicine contact a poison control center or emergency room at once. NOTE: This medicine is only for you. Do not share this medicine with others. What if I miss a dose? It is important not to miss a dose. Call your doctor or health care professional if you are unable to keep an appointment. What may interact with this  medicine? -medicines for seizures -medicines to increase blood counts like filgrastim, pegfilgrastim, sargramostim -some antibiotics like amikacin, gentamicin, neomycin, streptomycin, tobramycin -vaccines Talk to your doctor or health care professional before taking any of these medicines: -acetaminophen -aspirin -ibuprofen -ketoprofen -naproxen This list may not describe all possible interactions. Give your health care provider a list of all the medicines, herbs, non-prescription drugs, or dietary supplements you use. Also tell them if you smoke, drink alcohol, or use illegal drugs. Some items may interact with your medicine. What should I watch for while using this medicine? Your condition will be monitored carefully while you are receiving this medicine. You will need important blood work done while you are taking this medicine. This drug may make you feel generally unwell. This is not uncommon, as chemotherapy can affect healthy cells as well as cancer cells. Report any side effects. Continue your course of treatment even though you feel ill unless your doctor tells you to stop. In some cases, you may be given additional medicines to help with side effects. Follow all directions for their use. Call your doctor or health care professional for advice if you get a fever, chills or sore throat, or other symptoms of a cold or flu. Do not treat yourself. This drug decreases your body's ability to fight infections. Try to avoid being around people who are sick. This medicine may increase your risk to bruise or bleed. Call your doctor or health care professional if you notice any unusual bleeding. Be careful brushing and flossing your teeth or using a toothpick because you may get an infection or bleed more easily. If you have any dental work done, tell your dentist you are receiving this medicine. Avoid taking products that contain aspirin, acetaminophen, ibuprofen, naproxen, or ketoprofen unless  instructed by your doctor. These medicines may hide a fever. Do not become pregnant while taking this medicine. Women should inform their doctor if they wish to become pregnant or think they might be pregnant. There is a potential for serious side effects to an unborn child. Talk to your health care professional or pharmacist for more information. Do not breast-feed an infant while taking this medicine. What side effects may I notice from receiving this medicine? Side effects that you should report to your doctor or health care professional as soon as possible: -allergic reactions like skin rash, itching or hives, swelling of the face, lips, or tongue -signs of infection - fever or chills, cough, sore throat, pain or difficulty passing urine -signs of decreased platelets or bleeding - bruising, pinpoint red spots on the skin, black, tarry stools, nosebleeds -signs of decreased red blood cells - unusually weak or tired, fainting spells, lightheadedness -breathing problems -changes in hearing -changes in vision -chest pain -high blood pressure -low blood counts - This drug may decrease the number of white blood cells, red blood cells and platelets. You may be at increased risk for infections and bleeding. -nausea and vomiting -pain, swelling, redness or irritation at the injection site -pain, tingling, numbness in the hands or feet -problems with balance, talking, walking -trouble passing urine or  change in the amount of urine Side effects that usually do not require medical attention (report to your doctor or health care professional if they continue or are bothersome): -hair loss -loss of appetite -metallic taste in the mouth or changes in taste This list may not describe all possible side effects. Call your doctor for medical advice about side effects. You may report side effects to FDA at 1-800-FDA-1088. Where should I keep my medicine? This drug is given in a hospital or clinic and will  not be stored at home. NOTE: This sheet is a summary. It may not cover all possible information. If you have questions about this medicine, talk to your doctor, pharmacist, or health care provider.    2016, Elsevier/Gold Standard. (2007-05-01 14:38:05)

## 2015-01-22 NOTE — Telephone Encounter (Signed)
Follow up call placed, pt states she is doing great. Denies any questions or concerns at this time.

## 2015-01-22 NOTE — Telephone Encounter (Signed)
-----   Message from Egbert Garibaldi, RN sent at 01/21/2015  3:49 PM EST ----- Regarding: mohamed chemo f/u call  Pt of Dr. [name], first time Gemzar and Carboplatin.

## 2015-01-28 ENCOUNTER — Other Ambulatory Visit (HOSPITAL_BASED_OUTPATIENT_CLINIC_OR_DEPARTMENT_OTHER): Payer: Medicare Other

## 2015-01-28 ENCOUNTER — Telehealth: Payer: Self-pay | Admitting: Internal Medicine

## 2015-01-28 ENCOUNTER — Telehealth: Payer: Self-pay | Admitting: *Deleted

## 2015-01-28 ENCOUNTER — Other Ambulatory Visit: Payer: Medicare Other

## 2015-01-28 ENCOUNTER — Other Ambulatory Visit: Payer: Self-pay | Admitting: Medical Oncology

## 2015-01-28 ENCOUNTER — Ambulatory Visit (HOSPITAL_BASED_OUTPATIENT_CLINIC_OR_DEPARTMENT_OTHER): Payer: Medicare Other

## 2015-01-28 ENCOUNTER — Ambulatory Visit (HOSPITAL_BASED_OUTPATIENT_CLINIC_OR_DEPARTMENT_OTHER): Payer: Medicare Other | Admitting: Physician Assistant

## 2015-01-28 VITALS — BP 136/82 | HR 91 | Temp 97.6°F | Resp 18 | Ht 62.0 in | Wt 147.1 lb

## 2015-01-28 DIAGNOSIS — C3412 Malignant neoplasm of upper lobe, left bronchus or lung: Secondary | ICD-10-CM

## 2015-01-28 DIAGNOSIS — C3492 Malignant neoplasm of unspecified part of left bronchus or lung: Secondary | ICD-10-CM

## 2015-01-28 DIAGNOSIS — C7951 Secondary malignant neoplasm of bone: Secondary | ICD-10-CM | POA: Insufficient documentation

## 2015-01-28 DIAGNOSIS — Z5111 Encounter for antineoplastic chemotherapy: Secondary | ICD-10-CM | POA: Diagnosis not present

## 2015-01-28 DIAGNOSIS — I10 Essential (primary) hypertension: Secondary | ICD-10-CM | POA: Insufficient documentation

## 2015-01-28 DIAGNOSIS — Z23 Encounter for immunization: Secondary | ICD-10-CM | POA: Diagnosis not present

## 2015-01-28 LAB — COMPREHENSIVE METABOLIC PANEL
ALT: 36 U/L (ref 0–55)
ANION GAP: 11 meq/L (ref 3–11)
AST: 33 U/L (ref 5–34)
Albumin: 3.4 g/dL — ABNORMAL LOW (ref 3.5–5.0)
Alkaline Phosphatase: 88 U/L (ref 40–150)
BUN: 27 mg/dL — ABNORMAL HIGH (ref 7.0–26.0)
CHLORIDE: 98 meq/L (ref 98–109)
CO2: 26 meq/L (ref 22–29)
CREATININE: 0.9 mg/dL (ref 0.6–1.1)
Calcium: 9.1 mg/dL (ref 8.4–10.4)
EGFR: 61 mL/min/{1.73_m2} — ABNORMAL LOW (ref >90)
Glucose: 96 mg/dl (ref 70–140)
POTASSIUM: 3.7 meq/L (ref 3.5–5.1)
Sodium: 135 mEq/L — ABNORMAL LOW (ref 136–145)
Total Bilirubin: <0.3 mg/dL (ref 0.20–1.20)
Total Protein: 6.9 g/dL (ref 6.4–8.3)

## 2015-01-28 LAB — CBC WITH DIFFERENTIAL/PLATELET
BASO%: 0.6 % (ref 0.0–2.0)
Basophils Absolute: 0 10*3/uL (ref 0.0–0.1)
EOS ABS: 0.1 10*3/uL (ref 0.0–0.5)
EOS%: 1.1 % (ref 0.0–7.0)
HCT: 32.6 % — ABNORMAL LOW (ref 34.8–46.6)
HGB: 10.8 g/dL — ABNORMAL LOW (ref 11.6–15.9)
LYMPH%: 16.9 % (ref 14.0–49.7)
MCH: 31.5 pg (ref 25.1–34.0)
MCHC: 33.1 g/dL (ref 31.5–36.0)
MCV: 95 fL (ref 79.5–101.0)
MONO#: 0.1 10*3/uL (ref 0.1–0.9)
MONO%: 0.9 % (ref 0.0–14.0)
NEUT#: 4.3 10*3/uL (ref 1.5–6.5)
NEUT%: 80.5 % — ABNORMAL HIGH (ref 38.4–76.8)
PLATELETS: 161 10*3/uL (ref 145–400)
RBC: 3.43 10*6/uL — AB (ref 3.70–5.45)
RDW: 15.2 % — AB (ref 11.2–14.5)
WBC: 5.4 10*3/uL (ref 3.9–10.3)
lymph#: 0.9 10*3/uL (ref 0.9–3.3)

## 2015-01-28 MED ORDER — SODIUM CHLORIDE 0.9 % IV SOLN
1700.0000 mg | Freq: Once | INTRAVENOUS | Status: AC
  Administered 2015-01-28: 1700 mg via INTRAVENOUS
  Filled 2015-01-28: qty 44.71

## 2015-01-28 MED ORDER — INFLUENZA VAC SPLIT QUAD 0.5 ML IM SUSY
0.5000 mL | PREFILLED_SYRINGE | Freq: Once | INTRAMUSCULAR | Status: AC
  Administered 2015-01-28: 0.5 mL via INTRAMUSCULAR
  Filled 2015-01-28: qty 0.5

## 2015-01-28 MED ORDER — PROCHLORPERAZINE MALEATE 10 MG PO TABS: 10.0000 mg | ORAL_TABLET | Freq: Once | ORAL | Status: DC

## 2015-01-28 MED ORDER — HEPARIN SOD (PORK) LOCK FLUSH 100 UNIT/ML IV SOLN
500.0000 [IU] | Freq: Once | INTRAVENOUS | Status: AC | PRN
  Administered 2015-01-28: 500 [IU]
  Filled 2015-01-28: qty 5

## 2015-01-28 MED ORDER — SODIUM CHLORIDE 0.9 % IV SOLN
Freq: Once | INTRAVENOUS | Status: AC
  Administered 2015-01-28: 09:00:00 via INTRAVENOUS

## 2015-01-28 MED ORDER — SODIUM CHLORIDE 0.9 % IV SOLN: 1000.0000 mg/m2 | Freq: Once | INTRAVENOUS | Status: DC

## 2015-01-28 MED ORDER — SODIUM CHLORIDE 0.9 % IJ SOLN
10.0000 mL | INTRAMUSCULAR | Status: DC | PRN
  Administered 2015-01-28: 10 mL
  Filled 2015-01-28: qty 10

## 2015-01-28 MED ORDER — PROCHLORPERAZINE MALEATE 10 MG PO TABS
ORAL_TABLET | ORAL | Status: AC
  Filled 2015-01-28: qty 1

## 2015-01-28 NOTE — Progress Notes (Signed)
[place] [phone]   Fax:([phone]) [place] [address] [employer] [address] [place] [address]  DIAGNOSIS: Stage IV (T2, N2, M1a) non-small cell lung cancer, squamous cell carcinoma presented with central left upper lobe lung mass with associated postobstructive atelectasis as well as ipsilateral and subcarinal lymphadenopathy as well as pleural tumor spread diagnosed in October 2015.  PRIOR THERAPY:  1) Systemic chemotherapy with carboplatin for AUC of 5 on day 1 and Abraxane 100 MG/M2 on days 1, 8 and 15 every 3 weeks. Status post 6 cycles.  2) Nivolumab per research protocol BMS 370 group B, arm A, given every 2 weeks. Status post 7 cycles. Last dose was given 08/13/2014 discontinued on 08/28/2014 secondary to disease progression. 3) Systemic chemotherapy with docetaxel 75 MG/M2 and Cyramza 10 MG/KG every 3 weeks. First dose 09/10/2014. Status post 6 cycles.  CURRENT THERAPY: carboplatin and gemcitabine on day 1; and receiving the gemcitabine only portion of the chemotherapy on day 8 of each 21 day cycle. C1 D1 on 01/21/15  INTERVAL HISTORY: Tammie Clay 69 y.o. female returns to the clinic today for follow-up visit accompanied by her girlfriend prior to her day 8 of Gemzar. She is tolerating her treatment fairly well except for fatigue for 2 days following her first day of chemo, as well as some dry heaves, self resolved. She continues to have peripheral neuropathy. She denied having any weight loss or night sweats. She has insomnia, which is to be followed by her PCP. The patient denied having any chest pain, shortness of breath, cough or hemoptysis. She has no bleeding issues. She has no nausea or vomiting and no fever or chills.   MEDICAL HISTORY: Past Medical History  Diagnosis Date  . Hypertension   . COPD (chronic obstructive pulmonary disease) ([facility])   . Hyperlipidemia   . GERD (gastroesophageal  reflux disease)   . Osteopenia   . Insomnia   . Mediastinal lymphadenopathy 11/19/13    PER CT  . Multiple lung nodules on CT 11/19/13    LEFT LUNG BASE  . Lung mass 11/19/13    RUL PER CT  . Coronary artery calcification seen on CAT scan 11/21/2013  . COPD (chronic obstructive pulmonary disease) ([facility]) 11/21/2013  . Pneumonia     hx of  . Anxiety   . stage iv sq cell lung ca dx'd 11/2013    Recist   . Encounter for antineoplastic chemotherapy 12/03/2014    ALLERGIES:  has No Known Allergies.  MEDICATIONS:  Current Outpatient Prescriptions  Medication Sig Dispense Refill  . budesonide-formoterol (SYMBICORT) 160-4.5 MCG/ACT inhaler Inhale 2 puffs into the lungs 2 (two) times daily.    Marland Kitchen Fexofenadine HCl (ALLEGRA ALLERGY PO) Take by mouth once.    Marland Kitchen acetaminophen (TYLENOL) 325 MG tablet Take 650 mg by mouth daily as needed for moderate pain or headache. Take 2 tablets every morning    . albuterol (PROVENTIL HFA;VENTOLIN HFA) 108 (90 BASE) MCG/ACT inhaler Inhale 2 puffs into the lungs every 6 (six) hours as needed for wheezing.    . calcium carbonate (TUMS - DOSED IN MG ELEMENTAL CALCIUM) 500 MG chewable tablet Chew 1 tablet by mouth daily as needed for indigestion or heartburn.     . diphenoxylate-atropine (LOMOTIL) 2.5-0.025 MG tablet TAKE 2 TABLETS 4 TIMES A DAY AS NEEDED FOR DIARRHEA 30 tablet 0  . docusate sodium (COLACE) 100 MG capsule Take 100 mg by  mouth 2 (two) times daily as needed for moderate constipation.     . DULoxetine (CYMBALTA) 30 MG capsule TAKE ONE CAPSULE BY MOUTH EVERY DAY 30 capsule 2  . HYDROcodone-acetaminophen (NORCO) 10-325 MG per tablet Take 1 tablet by mouth every 6 (six) hours as needed for moderate pain or severe pain.     Marland Kitchen lidocaine-prilocaine (EMLA) cream Apply 1 application topically as needed. (Patient not taking: Reported on 01/14/2015) 30 g 0  . lisinopril-hydrochlorothiazide (PRINZIDE,ZESTORETIC) 20-12.5 MG per tablet Take 2 tablets by mouth every  morning.     . loperamide (IMODIUM) 2 MG capsule Take by mouth as needed for diarrhea or loose stools.    . methimazole (TAPAZOLE) 10 MG tablet Take 5 mg by mouth 2 (two) times daily.     . metoprolol succinate (TOPROL-XL) 50 MG 24 hr tablet Take 50 mg by mouth every morning. Take with or immediately following a meal.    . Multiple Vitamin (MULTIVITAMIN WITH MINERALS) TABS Take 1 tablet by mouth daily.    . polyethylene glycol (MIRALAX / GLYCOLAX) packet Take 17 g by mouth daily as needed for moderate constipation or severe constipation.     . prochlorperazine (COMPAZINE) 10 MG tablet Take 1 tablet (10 mg total) by mouth every 6 (six) hours as needed for nausea or vomiting. 30 tablet 1  . ranitidine (ZANTAC) 150 MG tablet Take 150 mg by mouth 2 (two) times daily as needed for heartburn.     . temazepam (RESTORIL) 30 MG capsule Take 1 capsule (30 mg total) by mouth at bedtime as needed for sleep. 30 capsule 0   No current facility-administered medications for this visit.    SURGICAL HISTORY:  Past Surgical History  Procedure Laterality Date  .  c section x 2    . Urethral cyst removed    . Tonsillectomy    . Appendectomy    . Colonoscopy with propofol N/A 07/17/2012    Procedure: COLONOSCOPY WITH PROPOFOL;  Surgeon: Garlan Fair, MD;  Location: [facility];  Service: Endoscopy;  Laterality: N/A;  . Esophagogastroduodenoscopy (egd) with propofol N/A 07/17/2012    Procedure: ESOPHAGOGASTRODUODENOSCOPY (EGD) WITH PROPOFOL;  Surgeon: Garlan Fair, MD;  Location: [facility];  Service: Endoscopy;  Laterality: N/A;  . Video bronchoscopy with endobronchial ultrasound N/A 12/03/2013    Procedure: VIDEO BRONCHOSCOPY WITH ENDOBRONCHIAL ULTRASOUND WITH BIOPSIES;  Surgeon: Grace Isaac, MD;  Location: [facility];  Service: Thoracic;  Laterality: N/A;  . Portacath placement Left 12/10/2013    Procedure: INSERTION PORT-A-CATH;  Surgeon: Grace Isaac, MD;  Location: [facility];  Service: Thoracic;   Laterality: Left;    REVIEW OF SYSTEMS:  Constitutional: positive for fatigue Eyes: negative Ears, nose, mouth, throat, and face: negative Respiratory: negative Cardiovascular: negative Gastrointestinal: negative Genitourinary:negative Integument/breast: negative Hematologic/lymphatic: negative Musculoskeletal:negative Neurological: positive for paresthesia Behavioral/Psych: positive for anxiety Endocrine: negative Allergic/Immunologic: negative   PHYSICAL EXAMINATION: General appearance: alert, cooperative, fatigued and no distress Head: Normocephalic, without obvious abnormality, atraumatic Neck: no adenopathy, no JVD, supple, symmetrical, trachea midline and thyroid not enlarged, symmetric, no tenderness/mass/nodules Lymph nodes: Cervical, supraclavicular, and axillary nodes normal. Resp: clear to auscultation bilaterally Back: symmetric, no curvature. ROM normal. No CVA tenderness. Cardio: regular rate and rhythm, S1, S2 normal, no murmur, click, rub or gallop GI: soft, non-tender; bowel sounds normal; no masses,  no organomegaly Genitalia: defer exam Extremities: extremities normal, atraumatic, no cyanosis or edema Neurologic: Alert and oriented X 3, normal strength and tone. Normal symmetric  reflexes. Normal coordination and gait  ECOG PERFORMANCE STATUS: 1 - Symptomatic but completely ambulatory  Blood pressure 136/82, pulse 91, temperature 97.6 F (36.4 C), temperature source Oral, resp. rate 18, height '5\' 2"'$  (1.575 m), weight 147 lb 1.6 oz (66.724 kg), SpO2 99 %.  LABORATORY DATA: CBC Latest Ref Rng 01/28/2015 01/14/2015 01/07/2015  WBC 3.9 - 10.3 10e3/uL 5.4 16.0(H) 17.3(H)  Hemoglobin 11.6 - 15.9 g/dL 10.8(L) 11.4(L) 11.7  Hematocrit 34.8 - 46.6 % 32.6(L) 34.5(L) 36.3  Platelets 145 - 400 10e3/uL 161 323 217   CMP Latest Ref Rng 01/14/2015 01/07/2015 12/31/2014  Glucose 70 - 140 mg/dl 114 100 108  BUN 7.0 - 26.0 mg/dL 27.1(H) 14.8 24.1  Creatinine 0.6 - 1.1 mg/dL  0.9 0.9 1.0  Sodium 136 - 145 mEq/L 134(L) 139 134(L)  Potassium 3.5 - 5.1 mEq/L 4.2 4.3 4.0  Chloride 101 - 111 mmol/L - - -  CO2 22 - 29 mEq/L '25 29 26  '$ Calcium 8.4 - 10.4 mg/dL 9.8 9.4 9.4  Total Protein 6.4 - 8.3 g/dL 6.8 6.9 6.4  Total Bilirubin 0.20 - 1.20 mg/dL <0.30 <0.30 0.50  Alkaline Phos 40 - 150 U/L 89 130 103  AST 5 - 34 U/L '17 30 24  '$ ALT 0 - 55 U/L '17 28 26     '$ RADIOGRAPHIC STUDIES: Ct Chest W Contrast  01/13/2015  CLINICAL DATA:  Subsequent evaluation of a 69 year old female with history of stage IV squamous cell lung cancer diagnosed in 2015. Chemotherapy ongoing. Shortness of breath for the past 3 weeks. EXAM: CT CHEST, ABDOMEN, AND PELVIS WITH CONTRAST TECHNIQUE: Multidetector CT imaging of the chest, abdomen and pelvis was performed following the standard protocol during bolus administration of intravenous contrast. CONTRAST:  19m OMNIPAQUE IOHEXOL 300 MG/ML  SOLN COMPARISON:  CT of the chest, abdomen and pelvis 11/10/2014. FINDINGS: CT CHEST FINDINGS Mediastinum/Nodes: Heart size is normal. There is no significant pericardial fluid, thickening or pericardial calcification. There is atherosclerosis of the thoracic aorta, the great vessels of the mediastinum and the coronary arteries, including calcified atherosclerotic plaque in the left main, left anterior descending left circumflex and right coronary arteries. Progression of previously noted left hilar and mediastinal lymphadenopathy. Specifically, there is a mildly enlarged left hilar lymph node measuring 1 cm in short axis (previously nonenlarged), interval enlargement of a 2.1 cm short axis subcarinal lymph node (previously 18 mm), unchanged 18 mm short axis low right paratracheal lymph node, and slightly enlarged 14 mm short axis high right paratracheal lymph node (previously 12 mm). Low paraesophageal lymph node also slightly increased currently measuring 13 mm in short axis (previously 11 mm). No axillary or  supraclavicular lymphadenopathy. Left-sided subclavian single-lumen porta cath with tip terminating at the superior cavoatrial junction. Lungs/Pleura: Interval enlargement of the previously noted left upper lobe mass in the medial aspect of the left upper lobe (image 26 of series 2), which currently measures 6.6 x 4.3 cm and makes a broad contact with the overlying pleura medially. There is also some lateral spiculations with retraction of the lateral pleura and smile pleural thickening and nodularity. New small left pleural effusion lying dependently. Multiple new tiny pulmonary nodules scattered throughout the lungs bilaterally, most evident in the right lower lobe on images 39 and 40 where there are multiple 3-5 mm pulmonary nodules. New 6 mm right lower lobe nodule on image 40 of series 4. Some previously noted pulmonary nodules have resolved, including the 6 mm nodules in the right lung base on image  46 of series 4 of prior examination 11/10/2014. However, other previously noted pulmonary nodules appear slightly enlarged compared to the prior study, including a 8 mm ground-glass attenuation nodule in the left lower lobe (image 31 of series 4), which previously measured only 6 mm. Likewise, a spiculated 8 mm left upper lobe nodule (image 18 of series 4) previously measured only 6 mm. Mild diffuse bronchial wall thickening with moderate centrilobular emphysema. Musculoskeletal: Expansile lytic lesion in the lateral aspect of the left sixth rib with nondisplaced pathologic fracture (image 27 of series 4). CT ABDOMEN PELVIS FINDINGS Hepatobiliary: With several sub cm well-defined low-attenuation lesions are again noted in the liver, too small to definitively characterize, but similar to prior studies, favored to represent tiny cysts. No other suspicious appearing hepatic lesions are noted. No intra or extrahepatic biliary ductal dilatation. Gallbladder is normal in appearance. Pancreas: No pancreatic ductal  dilatation. No pancreatic or peripancreatic fluid or inflammatory changes. 5 mm fatty attenuation lesion in the tail of the pancreas is unchanged, compatible with a tiny lipoma. No other larger pancreatic mass. Spleen: Unremarkable. Adrenals/Urinary Tract: Bilateral adrenal glands are normal in appearance. Altered right renal axis again noted (normal anatomical variant). Sub cm low-attenuation lesions in the kidneys bilaterally, too small to definitively characterize, but similar to the prior study, presumably tiny cysts. No other aggressive appearing renal lesions. No hydroureteronephrosis. Urinary bladder is unremarkable in appearance. Stomach/Bowel: Stomach is normal in appearance. No pathologic dilatation of small bowel or colon. Appendix is not confidently identified may be surgically absent. Regardless, no inflammatory changes are noted adjacent to the cecum to suggest the presence of an acute appendicitis at this time. Vascular/Lymphatic: Extensive atherosclerosis throughout the abdominal and pelvic vasculature including fusiform ectasia of the infrarenal abdominal aorta which measures up to 2.9 x 2.7 cm. No lymphadenopathy noted in the abdomen or pelvis. Reproductive: Uterus is slightly heterogeneous in appearance with multiple small calcifications, presumably tiny chronic calcified fibroids. In the left side of the fundal portion of the uterus there is a 2.9 x 2.6 x 2.6 cm lesion, compatible with a fibroid. 4.4 x 3.7 x 4.4 cm low-attenuation lesion in the left ovary is similar to the prior study, presumably an ovarian cyst. Right ovary is atrophic. Other: No significant volume of ascites.  No pneumoperitoneum. Musculoskeletal: Small lipoma measuring 2.0 x 2.4 cm in the inferior aspect of the left quadratus lumborum muscle belly anterior to the left femoral back. There are no aggressive appearing lytic or blastic lesions noted in the visualized portions of the skeleton. IMPRESSION: 1. 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 2. No signs of metastatic disease to the abdomen or pelvis. 3. Atherosclerosis, including left main and 3 vessel coronary artery disease. 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 4. Unchanged left ovarian cyst, as above. Stability over numerous prior examinations suggests a benign lesion. 5. Additional incidental findings, similar to prior examinations, as above. Electronically Signed   By: Vinnie Langton  M.D.   On: 01/13/2015 09:51   Ct Abdomen Pelvis W Contrast  01/13/2015  CLINICAL DATA:  Subsequent evaluation of a 69 year old female with history of stage IV squamous cell lung cancer diagnosed in 2015. Chemotherapy ongoing. Shortness of breath for the past 3 weeks. EXAM: CT CHEST, ABDOMEN, AND PELVIS WITH CONTRAST TECHNIQUE: Multidetector CT imaging of the chest, abdomen and pelvis was performed following the standard protocol during bolus administration of intravenous contrast. CONTRAST:  183m OMNIPAQUE IOHEXOL 300 MG/ML  SOLN COMPARISON:  CT of the chest, abdomen and pelvis 11/10/2014. FINDINGS: CT CHEST FINDINGS Mediastinum/Nodes: Heart size is normal. There is no significant pericardial fluid, thickening or pericardial calcification. There is atherosclerosis of the thoracic aorta, the great vessels of the mediastinum and the coronary arteries, including calcified  atherosclerotic plaque in the left main, left anterior descending left circumflex and right coronary arteries. Progression of previously noted left hilar and mediastinal lymphadenopathy. Specifically, there is a mildly enlarged left hilar lymph node measuring 1 cm in short axis (previously nonenlarged), interval enlargement of a 2.1 cm short axis subcarinal lymph node (previously 18 mm), unchanged 18 mm short axis low right paratracheal lymph node, and slightly enlarged 14 mm short axis high right paratracheal lymph node (previously 12 mm). Low paraesophageal lymph node also slightly increased currently measuring 13 mm in short axis (previously 11 mm). No axillary or supraclavicular lymphadenopathy. Left-sided subclavian single-lumen porta cath with tip terminating at the superior cavoatrial junction. Lungs/Pleura: Interval enlargement of the previously noted left upper lobe mass in the medial aspect of the left upper lobe (image 26 of series 2), which currently measures 6.6 x 4.3 cm and makes a broad contact with the overlying pleura medially. There is also some lateral spiculations with retraction of the lateral pleura and smile pleural thickening and nodularity. New small left pleural effusion lying dependently. Multiple new tiny pulmonary nodules scattered throughout the lungs bilaterally, most evident in the right lower lobe on images 39 and 40 where there are multiple 3-5 mm pulmonary nodules. New 6 mm right lower lobe nodule on image 40 of series 4. Some previously noted pulmonary nodules have resolved, including the 6 mm nodules in the right lung base on image 46 of series 4 of prior examination 11/10/2014. However, other previously noted pulmonary nodules appear slightly enlarged compared to the prior study, including a 8 mm ground-glass attenuation nodule in the left lower lobe (image 31 of series 4), which previously measured only 6 mm. Likewise, a spiculated 8 mm left upper lobe nodule (image 18 of  series 4) previously measured only 6 mm. Mild diffuse bronchial wall thickening with moderate centrilobular emphysema. Musculoskeletal: Expansile lytic lesion in the lateral aspect of the left sixth rib with nondisplaced pathologic fracture (image 27 of series 4). CT ABDOMEN PELVIS FINDINGS Hepatobiliary: With several sub cm well-defined low-attenuation lesions are again noted in the liver, too small to definitively characterize, but similar to prior studies, favored to represent tiny cysts. No other suspicious appearing hepatic lesions are noted. No intra or extrahepatic biliary ductal dilatation. Gallbladder is normal in appearance. Pancreas: No pancreatic ductal dilatation. No pancreatic or peripancreatic fluid or inflammatory changes. 5 mm fatty attenuation lesion in the tail of the pancreas is unchanged, compatible with a tiny lipoma. No other larger pancreatic mass. Spleen: Unremarkable. Adrenals/Urinary Tract: Bilateral adrenal glands are normal in appearance. Altered right renal axis again noted (normal anatomical variant). Sub cm low-attenuation lesions in the kidneys bilaterally, too small to definitively characterize, but similar  to the prior study, presumably tiny cysts. No other aggressive appearing renal lesions. No hydroureteronephrosis. Urinary bladder is unremarkable in appearance. Stomach/Bowel: Stomach is normal in appearance. No pathologic dilatation of small bowel or colon. Appendix is not confidently identified may be surgically absent. Regardless, no inflammatory changes are noted adjacent to the cecum to suggest the presence of an acute appendicitis at this time. Vascular/Lymphatic: Extensive atherosclerosis throughout the abdominal and pelvic vasculature including fusiform ectasia of the infrarenal abdominal aorta which measures up to 2.9 x 2.7 cm. No lymphadenopathy noted in the abdomen or pelvis. Reproductive: Uterus is slightly heterogeneous in appearance with multiple small  calcifications, presumably tiny chronic calcified fibroids. In the left side of the fundal portion of the uterus there is a 2.9 x 2.6 x 2.6 cm lesion, compatible with a fibroid. 4.4 x 3.7 x 4.4 cm low-attenuation lesion in the left ovary is similar to the prior study, presumably an ovarian cyst. Right ovary is atrophic. Other: No significant volume of ascites.  No pneumoperitoneum. Musculoskeletal: Small lipoma measuring 2.0 x 2.4 cm in the inferior aspect of the left quadratus lumborum muscle belly anterior to the left femoral back. There are no aggressive appearing lytic or blastic lesions noted in the visualized portions of the skeleton. IMPRESSION: 1. 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 2. No signs of metastatic disease to the abdomen or pelvis. 3. Atherosclerosis, including left main and 3 vessel coronary artery disease. 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 4. Unchanged left ovarian cyst, as above. Stability over numerous prior examinations suggests a benign lesion. 5. Additional incidental findings, similar to prior examinations, as above. Electronically Signed   By: Vinnie Langton M.D.   On: 01/13/2015 09:51    ASSESSMENT AND PLAN: This is a very pleasant 69 years old white female recently diagnosed with:  1) Stage IV non-small cell lung cancer, squamous cell carcinoma completed  systemic chemotherapy with carboplatin and Abraxane status post 6 cycles.  She was currently on treatment with immunotherapy according to the BMS checkmate 370 clinical trial with Nivolumab.she status post 7 cycles and tolerating her last treatment fairly well with no significant diarrhea. Her treatment was also discontinued secondary to disease progression. The patient was on systemic chemotherapy with docetaxel and Cyramza is status post 6 cycles and tolerating her treatment well except for fatigue. Repeat CT scan of the chest, abdomen and pelvis on 12/6/16showed progression of the disease She was started in a new regimen with Carboplatin on day 1 and Gemcitabine on days 1 and 8. D1 C1 was on 12/14. Will proceed with day 8 chemo today She will also receive her flu shot today The patient was advised to call immediately if she has any concerning symptoms in the interval.  The patient voices understanding of current disease status and treatment options and is in agreement with the current care plan.  All questions were answered. The patient knows to call the clinic with any problems, questions  or concerns. We can certainly see the patient much sooner if necessary.  ADDENDUM: Hematology/Oncology Attending: I had a face to face encounter with the patient today. I recommended her care plan. This is a very pleasant 69 years old white female with metastatic non-small cell lung cancer, squamous cell carcinoma status post several chemotherapy regimens with evidence for disease progression after last course of treatment with docetaxel and Cyramza. The patient was started on chemotherapy with carboplatin and gemcitabine status post day 1 of cycle #1. She tolerated the first dose of her treatment well except for fatigue and weakness for a few days after the treatment. She denied having any significant nausea or vomiting. She has no fever or chills. I recommended for the patient to proceed with day 8 of cycle #1  as a scheduled. She will come back for follow-up visit in 2 weeks for reevaluation before starting cycle #2. The patient will receive flu vaccine today. She was advised to call immediately if she has any concerning symptoms in the interval.  Disclaimer: This note was dictated with voice recognition software. Similar sounding words can inadvertently be transcribed and may be missed upon review. Eilleen Kempf., MD 01/28/2015

## 2015-01-28 NOTE — Telephone Encounter (Signed)
per po fto sch pt appt-sent MW email to sch pt trmt-pt to get updated copy b4 leaving trmt room

## 2015-01-28 NOTE — Telephone Encounter (Signed)
Per chemo RN I have scheduled next cycle

## 2015-01-28 NOTE — Patient Instructions (Signed)
[facility] Discharge Instructions for Patients Receiving Chemotherapy  Today you received the following chemotherapy agents:  Gemzar  To help prevent nausea and vomiting after your treatment, we encourage you to take your nausea medication as ordered per MD.   If you develop nausea and vomiting that is not controlled by your nausea medication, call the clinic.   BELOW ARE SYMPTOMS THAT SHOULD BE REPORTED IMMEDIATELY:  *FEVER GREATER THAN 100.5 F  *CHILLS WITH OR WITHOUT FEVER  NAUSEA AND VOMITING THAT IS NOT CONTROLLED WITH YOUR NAUSEA MEDICATION  *UNUSUAL SHORTNESS OF BREATH  *UNUSUAL BRUISING OR BLEEDING  TENDERNESS IN MOUTH AND THROAT WITH OR WITHOUT PRESENCE OF ULCERS  *URINARY PROBLEMS  *BOWEL PROBLEMS  UNUSUAL RASH Items with * indicate a potential emergency and should be followed up as soon as possible.  Feel free to call the clinic you have any questions or concerns. The clinic phone number is [phone].  Please show the CHEMO ALERT CARD at check-in to the Emergency Department and triage nurse.   

## 2015-01-28 NOTE — Telephone Encounter (Signed)
CHEMO FOR 1/4 ALREADY ADDED BY INF SCHEDULER FOR AFTER 1/4 F/U. NO CHANGES TO START TIME. NO OTHER ORDERS PER 12/21 POF.

## 2015-01-28 NOTE — Telephone Encounter (Signed)
per po ffor pt to follow appts as scheduled

## 2015-01-29 ENCOUNTER — Ambulatory Visit: Payer: Medicare Other | Admitting: Physician Assistant

## 2015-01-29 ENCOUNTER — Other Ambulatory Visit: Payer: Medicare Other

## 2015-02-04 ENCOUNTER — Ambulatory Visit: Payer: Medicare Other

## 2015-02-04 ENCOUNTER — Ambulatory Visit: Payer: Medicare Other | Admitting: Nurse Practitioner

## 2015-02-04 ENCOUNTER — Other Ambulatory Visit (HOSPITAL_BASED_OUTPATIENT_CLINIC_OR_DEPARTMENT_OTHER): Payer: Medicare Other

## 2015-02-04 ENCOUNTER — Other Ambulatory Visit: Payer: Medicare Other

## 2015-02-04 DIAGNOSIS — C3412 Malignant neoplasm of upper lobe, left bronchus or lung: Secondary | ICD-10-CM | POA: Diagnosis not present

## 2015-02-04 DIAGNOSIS — C3492 Malignant neoplasm of unspecified part of left bronchus or lung: Secondary | ICD-10-CM

## 2015-02-04 LAB — COMPREHENSIVE METABOLIC PANEL
ALT: 29 U/L (ref 0–55)
ANION GAP: 11 meq/L (ref 3–11)
AST: 25 U/L (ref 5–34)
Albumin: 3.4 g/dL — ABNORMAL LOW (ref 3.5–5.0)
Alkaline Phosphatase: 95 U/L (ref 40–150)
BUN: 21.9 mg/dL (ref 7.0–26.0)
CHLORIDE: 99 meq/L (ref 98–109)
CO2: 27 meq/L (ref 22–29)
CREATININE: 0.9 mg/dL (ref 0.6–1.1)
Calcium: 9.1 mg/dL (ref 8.4–10.4)
EGFR: 62 mL/min/{1.73_m2} — ABNORMAL LOW (ref >90)
GLUCOSE: 105 mg/dL (ref 70–140)
Potassium: 4 mEq/L (ref 3.5–5.1)
SODIUM: 137 meq/L (ref 136–145)
TOTAL PROTEIN: 6.8 g/dL (ref 6.4–8.3)
Total Bilirubin: 0.39 mg/dL (ref 0.20–1.20)

## 2015-02-04 LAB — CBC WITH DIFFERENTIAL/PLATELET
BASO%: 0.7 % (ref 0.0–2.0)
Basophils Absolute: 0 10*3/uL (ref 0.0–0.1)
EOS ABS: 0 10*3/uL (ref 0.0–0.5)
EOS%: 0.5 % (ref 0.0–7.0)
HEMATOCRIT: 30.5 % — AB (ref 34.8–46.6)
HEMOGLOBIN: 10.1 g/dL — AB (ref 11.6–15.9)
LYMPH#: 0.7 10*3/uL — AB (ref 0.9–3.3)
LYMPH%: 39.9 % (ref 14.0–49.7)
MCH: 30.6 pg (ref 25.1–34.0)
MCHC: 33.1 g/dL (ref 31.5–36.0)
MCV: 92.5 fL (ref 79.5–101.0)
MONO#: 0 10*3/uL — ABNORMAL LOW (ref 0.1–0.9)
MONO%: 1.4 % (ref 0.0–14.0)
NEUT%: 57.5 % (ref 38.4–76.8)
NEUTROS ABS: 1.1 10*3/uL — AB (ref 1.5–6.5)
Platelets: 21 10*3/uL — ABNORMAL LOW (ref 145–400)
RBC: 3.3 10*6/uL — ABNORMAL LOW (ref 3.70–5.45)
RDW: 16.5 % — AB (ref 11.2–14.5)
WBC: 1.9 10*3/uL — AB (ref 3.9–10.3)

## 2015-02-05 ENCOUNTER — Telehealth: Payer: Self-pay | Admitting: *Deleted

## 2015-02-05 ENCOUNTER — Ambulatory Visit: Payer: Medicare Other

## 2015-02-05 NOTE — Telephone Encounter (Signed)
Patient called reporting "I've had a bloody nose for a few days just pit one nostril.  When I blow my nose it's blood in the tissue.  No blood except when I blow my nose.  I think it's allergies because my eyes water, nose runs despite allegra.  Dr. 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  Has tried and still see's blood on tissue with swabbing.  Will notify Dr.Mohamed.  Denies ENT, allergist or PCP management of these symptoms.

## 2015-02-06 NOTE — Telephone Encounter (Signed)
Concern reviewed with MD, called pt and instructed per MD to use a saline nasal spray for nostrils. Keep scheduled appt. Pt verbalized understanding.

## 2015-02-11 ENCOUNTER — Ambulatory Visit: Payer: Medicare Other

## 2015-02-11 ENCOUNTER — Ambulatory Visit (HOSPITAL_BASED_OUTPATIENT_CLINIC_OR_DEPARTMENT_OTHER): Payer: Medicare Other | Admitting: Internal Medicine

## 2015-02-11 ENCOUNTER — Other Ambulatory Visit (HOSPITAL_BASED_OUTPATIENT_CLINIC_OR_DEPARTMENT_OTHER): Payer: Medicare Other

## 2015-02-11 ENCOUNTER — Telehealth: Payer: Self-pay | Admitting: Internal Medicine

## 2015-02-11 ENCOUNTER — Encounter: Payer: Self-pay | Admitting: Internal Medicine

## 2015-02-11 ENCOUNTER — Ambulatory Visit (HOSPITAL_BASED_OUTPATIENT_CLINIC_OR_DEPARTMENT_OTHER): Payer: Medicare Other

## 2015-02-11 VITALS — BP 164/93 | HR 100 | Temp 98.4°F | Resp 19 | Ht 62.0 in | Wt 145.6 lb

## 2015-02-11 DIAGNOSIS — T451X5A Adverse effect of antineoplastic and immunosuppressive drugs, initial encounter: Principal | ICD-10-CM

## 2015-02-11 DIAGNOSIS — D701 Agranulocytosis secondary to cancer chemotherapy: Secondary | ICD-10-CM | POA: Diagnosis not present

## 2015-02-11 DIAGNOSIS — C3412 Malignant neoplasm of upper lobe, left bronchus or lung: Secondary | ICD-10-CM

## 2015-02-11 DIAGNOSIS — C3492 Malignant neoplasm of unspecified part of left bronchus or lung: Secondary | ICD-10-CM

## 2015-02-11 HISTORY — DX: Agranulocytosis secondary to cancer chemotherapy: D70.1

## 2015-02-11 HISTORY — DX: Agranulocytosis secondary to cancer chemotherapy: T45.1X5A

## 2015-02-11 LAB — CBC WITH DIFFERENTIAL/PLATELET
BASO%: 0.4 % (ref 0.0–2.0)
Basophils Absolute: 0 10*3/uL (ref 0.0–0.1)
EOS%: 2.3 % (ref 0.0–7.0)
Eosinophils Absolute: 0 10*3/uL (ref 0.0–0.5)
HCT: 27 % — ABNORMAL LOW (ref 34.8–46.6)
HGB: 8.9 g/dL — ABNORMAL LOW (ref 11.6–15.9)
LYMPH%: 39.7 % (ref 14.0–49.7)
MCH: 30.6 pg (ref 25.1–34.0)
MCHC: 32.8 g/dL (ref 31.5–36.0)
MCV: 93.4 fL (ref 79.5–101.0)
MONO#: 0.4 10*3/uL (ref 0.1–0.9)
MONO%: 23.1 % — AB (ref 0.0–14.0)
NEUT%: 34.5 % — AB (ref 38.4–76.8)
NEUTROS ABS: 0.6 10*3/uL — AB (ref 1.5–6.5)
Platelets: 180 10*3/uL (ref 145–400)
RBC: 2.9 10*6/uL — AB (ref 3.70–5.45)
RDW: 15.8 % — ABNORMAL HIGH (ref 11.2–14.5)
WBC: 1.8 10*3/uL — AB (ref 3.9–10.3)
lymph#: 0.7 10*3/uL — ABNORMAL LOW (ref 0.9–3.3)

## 2015-02-11 LAB — COMPREHENSIVE METABOLIC PANEL
ALT: 29 U/L (ref 0–55)
ANION GAP: 9 meq/L (ref 3–11)
AST: 25 U/L (ref 5–34)
Albumin: 3.3 g/dL — ABNORMAL LOW (ref 3.5–5.0)
Alkaline Phosphatase: 98 U/L (ref 40–150)
BUN: 28.3 mg/dL — ABNORMAL HIGH (ref 7.0–26.0)
CHLORIDE: 104 meq/L (ref 98–109)
CO2: 29 meq/L (ref 22–29)
CREATININE: 1.1 mg/dL (ref 0.6–1.1)
Calcium: 8.6 mg/dL (ref 8.4–10.4)
EGFR: 52 mL/min/{1.73_m2} — ABNORMAL LOW (ref >90)
GLUCOSE: 107 mg/dL (ref 70–140)
Potassium: 4.2 mEq/L (ref 3.5–5.1)
SODIUM: 142 meq/L (ref 136–145)
Total Bilirubin: <0.3 mg/dL (ref 0.20–1.20)
Total Protein: 6.5 g/dL (ref 6.4–8.3)

## 2015-02-11 MED ORDER — TBO-FILGRASTIM 480 MCG/0.8ML ~~LOC~~ SOSY
480.0000 ug | PREFILLED_SYRINGE | Freq: Once | SUBCUTANEOUS | Status: AC
  Administered 2015-02-11: 480 ug via SUBCUTANEOUS
  Filled 2015-02-11: qty 0.8

## 2015-02-11 NOTE — Progress Notes (Signed)
[place] Telephone:([phone]) [phone]   Fax:([phone]) [place] [address] [employer] [address] [place] [address]  DIAGNOSIS: Stage IV (T2, N2, M1a) non-small cell lung cancer, squamous cell carcinoma presented with central left upper lobe lung mass with associated postobstructive atelectasis as well as ipsilateral and subcarinal lymphadenopathy as well as pleural tumor spread diagnosed in October 2015.  PRIOR THERAPY:  1) Systemic chemotherapy with carboplatin for AUC of 5 on day 1 and Abraxane 100 MG/M2 on days 1, 8 and 15 every 3 weeks. Status post 6 cycles.  2) Nivolumab per research protocol BMS 370 group B, arm A, given every 2 weeks. Status post 7 cycles. Last dose was given 08/13/2014 discontinued on 08/28/2014 secondary to disease progression. 3) Systemic chemotherapy with docetaxel 75 MG/M2 and Cyramza 10 MG/KG every 3 weeks. First dose 09/10/2014. Status post 6 cycles. Discontinued secondary to disease progression   CURRENT THERAPY: Systemic chemotherapy with carboplatin for AUC of 5 on day 1 and gemcitabine 1000 MG/M2 on days 1 and 8 every 3 weeks, status post 1 cycle.  INTERVAL HISTORY: Tammie Clay 70 y.o. female returns to the clinic today for follow-up visit accompanied by her boyfriend. The patient is feeling fine today except for fatigue. She is tolerating her current treatment with carboplatin and gemcitabine fairly well except for the fatigue. She denied having any weight loss or night sweats. The patient denied having any chest pain, shortness of breath, cough or hemoptysis. She has no bleeding issues. She has no nausea or vomiting and no fever or chills. The patient is here today to start cycle #2 of her systemic chemotherapy.  MEDICAL HISTORY: Past Medical History  Diagnosis Date  . Hypertension   . COPD (chronic obstructive pulmonary disease) ([place])   . Hyperlipidemia   . GERD (gastroesophageal reflux  disease)   . Osteopenia   . Insomnia   . Mediastinal lymphadenopathy 11/19/13    PER CT  . Multiple lung nodules on CT 11/19/13    LEFT LUNG BASE  . Lung mass 11/19/13    RUL PER CT  . Coronary artery calcification seen on CAT scan 11/21/2013  . COPD (chronic obstructive pulmonary disease) ([place]) 11/21/2013  . Pneumonia     hx of  . Anxiety   . stage iv sq cell lung ca dx'd 11/2013    Recist   . Encounter for antineoplastic chemotherapy 12/03/2014    ALLERGIES:  has No Known Allergies.  MEDICATIONS:  Current Outpatient Prescriptions  Medication Sig Dispense Refill  . acetaminophen (TYLENOL) 325 MG tablet Take 650 mg by mouth daily as needed for moderate pain or headache. Take 2 tablets every morning    . albuterol (PROVENTIL HFA;VENTOLIN HFA) 108 (90 BASE) MCG/ACT inhaler Inhale 2 puffs into the lungs every 6 (six) hours as needed for wheezing.    . budesonide-formoterol (SYMBICORT) 160-4.5 MCG/ACT inhaler Inhale 2 puffs into the lungs 2 (two) times daily.    . calcium carbonate (TUMS - DOSED IN MG ELEMENTAL CALCIUM) 500 MG chewable tablet Chew 1 tablet by mouth daily as needed for indigestion or heartburn.     . diphenoxylate-atropine (LOMOTIL) 2.5-0.025 MG tablet TAKE 2 TABLETS 4 TIMES A DAY AS NEEDED FOR DIARRHEA 30 tablet 0  . docusate sodium (COLACE) 100 MG capsule Take 100 mg by mouth 2 (two) times daily as needed for moderate constipation.     . DULoxetine (CYMBALTA) 30 MG capsule TAKE  ONE CAPSULE BY MOUTH EVERY DAY 30 capsule 2  . Fexofenadine HCl (ALLEGRA ALLERGY PO) Take by mouth once.    Marland Kitchen HYDROcodone-acetaminophen (NORCO) 10-325 MG per tablet Take 1 tablet by mouth every 6 (six) hours as needed for moderate pain or severe pain.     Marland Kitchen lidocaine-prilocaine (EMLA) cream Apply 1 application topically as needed. (Patient not taking: Reported on 01/14/2015) 30 g 0  . lisinopril-hydrochlorothiazide (PRINZIDE,ZESTORETIC) 20-12.5 MG per tablet Take 2 tablets by mouth every morning.      . loperamide (IMODIUM) 2 MG capsule Take by mouth as needed for diarrhea or loose stools.    . metoprolol succinate (TOPROL-XL) 50 MG 24 hr tablet Take 50 mg by mouth every morning. Take with or immediately following a meal.    . mirtazapine (REMERON) 15 MG tablet Take 15 mg by mouth at bedtime. take 1-3 tablets  3  . Multiple Vitamin (MULTIVITAMIN WITH MINERALS) TABS Take 1 tablet by mouth daily.    . polyethylene glycol (MIRALAX / GLYCOLAX) packet Take 17 g by mouth daily as needed for moderate constipation or severe constipation.     . prochlorperazine (COMPAZINE) 10 MG tablet Take 1 tablet (10 mg total) by mouth every 6 (six) hours as needed for nausea or vomiting. 30 tablet 1  . ranitidine (ZANTAC) 150 MG tablet Take 150 mg by mouth 2 (two) times daily as needed for heartburn.     . SYMBICORT 80-4.5 MCG/ACT inhaler 2 Inhalers.  5   No current facility-administered medications for this visit.    SURGICAL HISTORY:  Past Surgical History  Procedure Laterality Date  .  c section x 2    . Urethral cyst removed    . Tonsillectomy    . Appendectomy    . Colonoscopy with propofol N/A 07/17/2012    Procedure: COLONOSCOPY WITH PROPOFOL;  Surgeon: Garlan Fair, MD;  Location: [facility];  Service: Endoscopy;  Laterality: N/A;  . Esophagogastroduodenoscopy (egd) with propofol N/A 07/17/2012    Procedure: ESOPHAGOGASTRODUODENOSCOPY (EGD) WITH PROPOFOL;  Surgeon: Garlan Fair, MD;  Location: [facility];  Service: Endoscopy;  Laterality: N/A;  . Video bronchoscopy with endobronchial ultrasound N/A 12/03/2013    Procedure: VIDEO BRONCHOSCOPY WITH ENDOBRONCHIAL ULTRASOUND WITH BIOPSIES;  Surgeon: Grace Isaac, MD;  Location: [facility];  Service: Thoracic;  Laterality: N/A;  . Portacath placement Left 12/10/2013    Procedure: INSERTION PORT-A-CATH;  Surgeon: Grace Isaac, MD;  Location: [facility];  Service: Thoracic;  Laterality: Left;    REVIEW OF SYSTEMS:  Constitutional: positive  for fatigue Eyes: negative Ears, nose, mouth, throat, and face: negative Respiratory: negative Cardiovascular: negative Gastrointestinal: negative Genitourinary:negative Integument/breast: negative Hematologic/lymphatic: negative Musculoskeletal:negative Neurological: positive for paresthesia Behavioral/Psych: positive for anxiety Endocrine: negative Allergic/Immunologic: negative   PHYSICAL EXAMINATION: General appearance: alert, cooperative, fatigued and no distress Head: Normocephalic, without obvious abnormality, atraumatic Neck: no adenopathy, no JVD, supple, symmetrical, trachea midline and thyroid not enlarged, symmetric, no tenderness/mass/nodules Lymph nodes: Cervical, supraclavicular, and axillary nodes normal. Resp: clear to auscultation bilaterally Back: symmetric, no curvature. ROM normal. No CVA tenderness. Cardio: regular rate and rhythm, S1, S2 normal, no murmur, click, rub or gallop GI: soft, non-tender; bowel sounds normal; no masses,  no organomegaly Genitalia: defer exam Extremities: extremities normal, atraumatic, no cyanosis or edema Neurologic: Alert and oriented X 3, normal strength and tone. Normal symmetric reflexes. Normal coordination and gait  ECOG PERFORMANCE STATUS: 1 - Symptomatic but completely ambulatory  Blood pressure 164/93, pulse 100, temperature  98.4 F (36.9 C), temperature source Oral, resp. rate 19, height '5\' 2"'$  (1.575 m), weight 145 lb 9.6 oz (66.044 kg), SpO2 100 %.  LABORATORY DATA: Lab Results  Component Value Date   WBC 1.8* 02/11/2015   HGB 8.9* 02/11/2015   HCT 27.0* 02/11/2015   MCV 93.4 02/11/2015   PLT 180 02/11/2015      Chemistry      Component Value Date/Time   NA 142 02/11/2015 1146   NA 135 07/19/2014 1607   K 4.2 02/11/2015 1146   K 3.4* 07/19/2014 1607   CL 99* 07/19/2014 1607   CO2 29 02/11/2015 1146   CO2 28 07/19/2014 1607   BUN 28.3* 02/11/2015 1146   BUN 21* 07/19/2014 1607   CREATININE 1.1  02/11/2015 1146   CREATININE 1.01* 07/19/2014 1607   CREATININE 0.80 11/22/2013 1054      Component Value Date/Time   CALCIUM 8.6 02/11/2015 1146   CALCIUM 8.7* 07/19/2014 1607   ALKPHOS 98 02/11/2015 1146   ALKPHOS 77 12/10/2013 0637   AST 25 02/11/2015 1146   AST 16 12/10/2013 0637   ALT 29 02/11/2015 1146   ALT 16 12/10/2013 0637   BILITOT <0.30 02/11/2015 1146   BILITOT 0.4 12/10/2013 0637       RADIOGRAPHIC STUDIES: Ct Chest W Contrast  01/13/2015  CLINICAL DATA:  Subsequent evaluation of a 70 year old female with history of stage IV squamous cell lung cancer diagnosed in 2015. Chemotherapy ongoing. Shortness of breath for the past 3 weeks. EXAM: CT CHEST, ABDOMEN, AND PELVIS WITH CONTRAST TECHNIQUE: Multidetector CT imaging of the chest, abdomen and pelvis was performed following the standard protocol during bolus administration of intravenous contrast. CONTRAST:  165m OMNIPAQUE IOHEXOL 300 MG/ML  SOLN COMPARISON:  CT of the chest, abdomen and pelvis 11/10/2014. FINDINGS: CT CHEST FINDINGS Mediastinum/Nodes: Heart size is normal. There is no significant pericardial fluid, thickening or pericardial calcification. There is atherosclerosis of the thoracic aorta, the great vessels of the mediastinum and the coronary arteries, including calcified atherosclerotic plaque in the left main, left anterior descending left circumflex and right coronary arteries. Progression of previously noted left hilar and mediastinal lymphadenopathy. Specifically, there is a mildly enlarged left hilar lymph node measuring 1 cm in short axis (previously nonenlarged), interval enlargement of a 2.1 cm short axis subcarinal lymph node (previously 18 mm), unchanged 18 mm short axis low right paratracheal lymph node, and slightly enlarged 14 mm short axis high right paratracheal lymph node (previously 12 mm). Low paraesophageal lymph node also slightly increased currently measuring 13 mm in short axis (previously 11  mm). No axillary or supraclavicular lymphadenopathy. Left-sided subclavian single-lumen porta cath with tip terminating at the superior cavoatrial junction. Lungs/Pleura: Interval enlargement of the previously noted left upper lobe mass in the medial aspect of the left upper lobe (image 26 of series 2), which currently measures 6.6 x 4.3 cm and makes a broad contact with the overlying pleura medially. There is also some lateral spiculations with retraction of the lateral pleura and smile pleural thickening and nodularity. New small left pleural effusion lying dependently. Multiple new tiny pulmonary nodules scattered throughout the lungs bilaterally, most evident in the right lower lobe on images 39 and 40 where there are multiple 3-5 mm pulmonary nodules. New 6 mm right lower lobe nodule on image 40 of series 4. Some previously noted pulmonary nodules have resolved, including the 6 mm nodules in the right lung base on image 46 of series 4 of prior examination  11/10/2014. However, other previously noted pulmonary nodules appear slightly enlarged compared to the prior study, including a 8 mm ground-glass attenuation nodule in the left lower lobe (image 31 of series 4), which previously measured only 6 mm. Likewise, a spiculated 8 mm left upper lobe nodule (image 18 of series 4) previously measured only 6 mm. Mild diffuse bronchial wall thickening with moderate centrilobular emphysema. Musculoskeletal: Expansile lytic lesion in the lateral aspect of the left sixth rib with nondisplaced pathologic fracture (image 27 of series 4). CT ABDOMEN PELVIS FINDINGS Hepatobiliary: With several sub cm well-defined low-attenuation lesions are again noted in the liver, too small to definitively characterize, but similar to prior studies, favored to represent tiny cysts. No other suspicious appearing hepatic lesions are noted. No intra or extrahepatic biliary ductal dilatation. Gallbladder is normal in appearance. Pancreas: No  pancreatic ductal dilatation. No pancreatic or peripancreatic fluid or inflammatory changes. 5 mm fatty attenuation lesion in the tail of the pancreas is unchanged, compatible with a tiny lipoma. No other larger pancreatic mass. Spleen: Unremarkable. Adrenals/Urinary Tract: Bilateral adrenal glands are normal in appearance. Altered right renal axis again noted (normal anatomical variant). Sub cm low-attenuation lesions in the kidneys bilaterally, too small to definitively characterize, but similar to the prior study, presumably tiny cysts. No other aggressive appearing renal lesions. No hydroureteronephrosis. Urinary bladder is unremarkable in appearance. Stomach/Bowel: Stomach is normal in appearance. No pathologic dilatation of small bowel or colon. Appendix is not confidently identified may be surgically absent. Regardless, no inflammatory changes are noted adjacent to the cecum to suggest the presence of an acute appendicitis at this time. Vascular/Lymphatic: Extensive atherosclerosis throughout the abdominal and pelvic vasculature including fusiform ectasia of the infrarenal abdominal aorta which measures up to 2.9 x 2.7 cm. No lymphadenopathy noted in the abdomen or pelvis. Reproductive: Uterus is slightly heterogeneous in appearance with multiple small calcifications, presumably tiny chronic calcified fibroids. In the left side of the fundal portion of the uterus there is a 2.9 x 2.6 x 2.6 cm lesion, compatible with a fibroid. 4.4 x 3.7 x 4.4 cm low-attenuation lesion in the left ovary is similar to the prior study, presumably an ovarian cyst. Right ovary is atrophic. Other: No significant volume of ascites.  No pneumoperitoneum. Musculoskeletal: Small lipoma measuring 2.0 x 2.4 cm in the inferior aspect of the left quadratus lumborum muscle belly anterior to the left femoral back. There are no aggressive appearing lytic or blastic lesions noted in the visualized portions of the skeleton. IMPRESSION: 1.  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 2. No signs of metastatic disease to the abdomen or pelvis. 3. Atherosclerosis, including left main and 3 vessel coronary artery disease. 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 4. Unchanged left ovarian cyst, as above. Stability over numerous prior examinations suggests a benign lesion. 5. Additional incidental findings, similar to prior examinations, as above. Electronically Signed   By: Vinnie Langton M.D.   On: 01/13/2015 09:51  Ct Abdomen Pelvis W Contrast  01/13/2015  CLINICAL DATA:  Subsequent evaluation of a 70 year old female with history of stage IV squamous cell lung cancer diagnosed in 2015. Chemotherapy ongoing. Shortness of breath for the past 3 weeks. EXAM: CT CHEST, ABDOMEN, AND PELVIS WITH CONTRAST TECHNIQUE: Multidetector CT imaging of the chest, abdomen and pelvis was performed following the standard protocol during bolus administration of intravenous contrast. CONTRAST:  112m OMNIPAQUE IOHEXOL 300 MG/ML  SOLN COMPARISON:  CT of the chest, abdomen and pelvis 11/10/2014. FINDINGS: CT CHEST FINDINGS Mediastinum/Nodes: Heart size is normal. There is no significant pericardial fluid, thickening or pericardial calcification. There is atherosclerosis of the thoracic aorta, the great vessels of the mediastinum and the coronary arteries,  including calcified atherosclerotic plaque in the left main, left anterior descending left circumflex and right coronary arteries. Progression of previously noted left hilar and mediastinal lymphadenopathy. Specifically, there is a mildly enlarged left hilar lymph node measuring 1 cm in short axis (previously nonenlarged), interval enlargement of a 2.1 cm short axis subcarinal lymph node (previously 18 mm), unchanged 18 mm short axis low right paratracheal lymph node, and slightly enlarged 14 mm short axis high right paratracheal lymph node (previously 12 mm). Low paraesophageal lymph node also slightly increased currently measuring 13 mm in short axis (previously 11 mm). No axillary or supraclavicular lymphadenopathy. Left-sided subclavian single-lumen porta cath with tip terminating at the superior cavoatrial junction. Lungs/Pleura: Interval enlargement of the previously noted left upper lobe mass in the medial aspect of the left upper lobe (image 26 of series 2), which currently measures 6.6 x 4.3 cm and makes a broad contact with the overlying pleura medially. There is also some lateral spiculations with retraction of the lateral pleura and smile pleural thickening and nodularity. New small left pleural effusion lying dependently. Multiple new tiny pulmonary nodules scattered throughout the lungs bilaterally, most evident in the right lower lobe on images 39 and 40 where there are multiple 3-5 mm pulmonary nodules. New 6 mm right lower lobe nodule on image 40 of series 4. Some previously noted pulmonary nodules have resolved, including the 6 mm nodules in the right lung base on image 46 of series 4 of prior examination 11/10/2014. However, other previously noted pulmonary nodules appear slightly enlarged compared to the prior study, including a 8 mm ground-glass attenuation nodule in the left lower lobe (image 31 of series 4), which previously measured only 6 mm. Likewise, a spiculated 8 mm left upper lobe  nodule (image 18 of series 4) previously measured only 6 mm. Mild diffuse bronchial wall thickening with moderate centrilobular emphysema. Musculoskeletal: Expansile lytic lesion in the lateral aspect of the left sixth rib with nondisplaced pathologic fracture (image 27 of series 4). CT ABDOMEN PELVIS FINDINGS Hepatobiliary: With several sub cm well-defined low-attenuation lesions are again noted in the liver, too small to definitively characterize, but similar to prior studies, favored to represent tiny cysts. No other suspicious appearing hepatic lesions are noted. No intra or extrahepatic biliary ductal dilatation. Gallbladder is normal in appearance. Pancreas: No pancreatic ductal dilatation. No pancreatic or peripancreatic fluid or inflammatory changes. 5 mm fatty attenuation lesion in the tail of the pancreas is unchanged, compatible with a tiny lipoma. No other larger pancreatic mass. Spleen: Unremarkable. Adrenals/Urinary Tract: Bilateral adrenal glands are normal in appearance. Altered right renal axis again noted (normal anatomical variant). Sub cm low-attenuation lesions in the kidneys bilaterally, too small to definitively characterize, but similar to the prior study, presumably tiny cysts. No  other aggressive appearing renal lesions. No hydroureteronephrosis. Urinary bladder is unremarkable in appearance. Stomach/Bowel: Stomach is normal in appearance. No pathologic dilatation of small bowel or colon. Appendix is not confidently identified may be surgically absent. Regardless, no inflammatory changes are noted adjacent to the cecum to suggest the presence of an acute appendicitis at this time. Vascular/Lymphatic: Extensive atherosclerosis throughout the abdominal and pelvic vasculature including fusiform ectasia of the infrarenal abdominal aorta which measures up to 2.9 x 2.7 cm. No lymphadenopathy noted in the abdomen or pelvis. Reproductive: Uterus is slightly heterogeneous in appearance with multiple  small calcifications, presumably tiny chronic calcified fibroids. In the left side of the fundal portion of the uterus there is a 2.9 x 2.6 x 2.6 cm lesion, compatible with a fibroid. 4.4 x 3.7 x 4.4 cm low-attenuation lesion in the left ovary is similar to the prior study, presumably an ovarian cyst. Right ovary is atrophic. Other: No significant volume of ascites.  No pneumoperitoneum. Musculoskeletal: Small lipoma measuring 2.0 x 2.4 cm in the inferior aspect of the left quadratus lumborum muscle belly anterior to the left femoral back. There are no aggressive appearing lytic or blastic lesions noted in the visualized portions of the skeleton. IMPRESSION: 1. 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 2. No signs of metastatic disease to the abdomen or pelvis. 3. Atherosclerosis, including left main and 3 vessel coronary artery disease. 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 4. Unchanged left ovarian cyst, as above. Stability over numerous prior examinations suggests a benign lesion. 5. Additional incidental findings, similar to prior examinations, as above. Electronically Signed   By: Vinnie Langton M.D.   On: 01/13/2015 09:51    ASSESSMENT AND PLAN: This is a very pleasant 70 years old white female recently diagnosed with:  1) Stage IV non-small cell lung cancer, squamous cell carcinoma  completed systemic chemotherapy with carboplatin and Abraxane status post 6 cycles.  She is currently on treatment with immunotherapy according to the BMS checkmate 370 clinical trial with Nivolumab.she status post 7 cycles and tolerating her last treatment fairly well with no significant diarrhea. Her treatment was also discontinued secondary to disease progression. The patient was started on systemic chemotherapy with docetaxel and surrounds is status post 6 cycles and tolerating her treatment well except for fatigue and pain from the Neulasta injection. This treatment was discontinued secondary to disease progression. The patient is currently undergoing systemic chemotherapy with carboplatin for AUC of 5 on day 1 and gemcitabine 1000 MG/M2 on days 1 and 8 every 3 weeks is status post 1 cycle. She tolerated the first cycle of her treatment well except for fatigue. Her CBC today showed absolute neutrophil count of 800 which would not be enough for the patient to start cycle #2. I recommended for her to delay the start of cycle #2 by 1 week until improvement of her absolute neutrophil count. For the chemotherapy-induced neutropenia, I will arrange for the patient to receive Granix  480 g subcutaneously today and tomorrow. She would come back for follow-up visit in 4 weeks with the start of cycle #3 of her chemotherapy. The patient was advised to call immediately if she has any concerning symptoms in the interval.  The patient voices understanding of current disease status and treatment options and is in agreement with the current care plan.  All questions were answered. The patient knows to call the clinic with any problems, questions or concerns. We can certainly see the patient much sooner if necessary.  Disclaimer: This note was dictated with voice recognition software. Similar sounding words can inadvertently be transcribed and may not be corrected upon review.

## 2015-02-11 NOTE — Telephone Encounter (Signed)
Per 1/4 pof tx delayed til next week. Appointments adjusted and added as per 1/4 pof. Gave patient avs report and appointments for January and February.

## 2015-02-12 ENCOUNTER — Ambulatory Visit (HOSPITAL_BASED_OUTPATIENT_CLINIC_OR_DEPARTMENT_OTHER): Payer: Medicare Other

## 2015-02-12 VITALS — BP 143/98 | HR 122 | Temp 98.4°F | Resp 22

## 2015-02-12 DIAGNOSIS — C3412 Malignant neoplasm of upper lobe, left bronchus or lung: Secondary | ICD-10-CM | POA: Diagnosis not present

## 2015-02-12 DIAGNOSIS — T451X5A Adverse effect of antineoplastic and immunosuppressive drugs, initial encounter: Principal | ICD-10-CM

## 2015-02-12 DIAGNOSIS — D701 Agranulocytosis secondary to cancer chemotherapy: Secondary | ICD-10-CM

## 2015-02-12 MED ORDER — TBO-FILGRASTIM 480 MCG/0.8ML ~~LOC~~ SOSY
480.0000 ug | PREFILLED_SYRINGE | Freq: Once | SUBCUTANEOUS | Status: AC
  Administered 2015-02-12: 480 ug via SUBCUTANEOUS
  Filled 2015-02-12: qty 0.8

## 2015-02-17 ENCOUNTER — Other Ambulatory Visit: Payer: Self-pay | Admitting: *Deleted

## 2015-02-17 DIAGNOSIS — C3492 Malignant neoplasm of unspecified part of left bronchus or lung: Secondary | ICD-10-CM

## 2015-02-18 ENCOUNTER — Ambulatory Visit (HOSPITAL_BASED_OUTPATIENT_CLINIC_OR_DEPARTMENT_OTHER): Payer: Medicare Other

## 2015-02-18 ENCOUNTER — Other Ambulatory Visit (HOSPITAL_BASED_OUTPATIENT_CLINIC_OR_DEPARTMENT_OTHER): Payer: Medicare Other

## 2015-02-18 VITALS — BP 114/67 | HR 101 | Temp 98.3°F | Resp 20

## 2015-02-18 DIAGNOSIS — Z5111 Encounter for antineoplastic chemotherapy: Secondary | ICD-10-CM

## 2015-02-18 DIAGNOSIS — C3492 Malignant neoplasm of unspecified part of left bronchus or lung: Secondary | ICD-10-CM

## 2015-02-18 DIAGNOSIS — C3412 Malignant neoplasm of upper lobe, left bronchus or lung: Secondary | ICD-10-CM

## 2015-02-18 LAB — COMPREHENSIVE METABOLIC PANEL
ALK PHOS: 92 U/L (ref 40–150)
ALT: 18 U/L (ref 0–55)
AST: 23 U/L (ref 5–34)
Albumin: 3.4 g/dL — ABNORMAL LOW (ref 3.5–5.0)
Anion Gap: 11 mEq/L (ref 3–11)
BUN: 28.2 mg/dL — ABNORMAL HIGH (ref 7.0–26.0)
CO2: 28 meq/L (ref 22–29)
Calcium: 9 mg/dL (ref 8.4–10.4)
Chloride: 99 mEq/L (ref 98–109)
Creatinine: 1.1 mg/dL (ref 0.6–1.1)
EGFR: 49 mL/min/{1.73_m2} — AB (ref >90)
GLUCOSE: 114 mg/dL (ref 70–140)
POTASSIUM: 3.9 meq/L (ref 3.5–5.1)
SODIUM: 139 meq/L (ref 136–145)
Total Bilirubin: <0.3 mg/dL (ref 0.20–1.20)
Total Protein: 7 g/dL (ref 6.4–8.3)

## 2015-02-18 LAB — CBC WITH DIFFERENTIAL/PLATELET
BASO%: 0.3 % (ref 0.0–2.0)
BASOS ABS: 0 10*3/uL (ref 0.0–0.1)
EOS ABS: 0 10*3/uL (ref 0.0–0.5)
EOS%: 0.2 % (ref 0.0–7.0)
HCT: 30.8 % — ABNORMAL LOW (ref 34.8–46.6)
HGB: 10 g/dL — ABNORMAL LOW (ref 11.6–15.9)
LYMPH%: 13.6 % — AB (ref 14.0–49.7)
MCH: 30.5 pg (ref 25.1–34.0)
MCHC: 32.6 g/dL (ref 31.5–36.0)
MCV: 93.6 fL (ref 79.5–101.0)
MONO#: 2.3 10*3/uL — ABNORMAL HIGH (ref 0.1–0.9)
MONO%: 15.8 % — ABNORMAL HIGH (ref 0.0–14.0)
NEUT#: 10.2 10*3/uL — ABNORMAL HIGH (ref 1.5–6.5)
NEUT%: 70.1 % (ref 38.4–76.8)
Platelets: 421 10*3/uL — ABNORMAL HIGH (ref 145–400)
RBC: 3.29 10*6/uL — AB (ref 3.70–5.45)
RDW: 17.3 % — AB (ref 11.2–14.5)
WBC: 14.6 10*3/uL — ABNORMAL HIGH (ref 3.9–10.3)
lymph#: 2 10*3/uL (ref 0.9–3.3)

## 2015-02-18 MED ORDER — ACETAMINOPHEN 325 MG PO TABS
650.0000 mg | ORAL_TABLET | ORAL | Status: AC
  Administered 2015-02-18: 650 mg via ORAL

## 2015-02-18 MED ORDER — SODIUM CHLORIDE 0.9 % IJ SOLN
10.0000 mL | INTRAMUSCULAR | Status: DC | PRN
  Administered 2015-02-18: 10 mL
  Filled 2015-02-18: qty 10

## 2015-02-18 MED ORDER — CARBOPLATIN CHEMO INTRADERMAL TEST DOSE 100MCG/0.02ML
100.0000 ug | Freq: Once | INTRADERMAL | Status: AC
  Administered 2015-02-18: 100 ug via INTRADERMAL
  Filled 2015-02-18: qty 0.01

## 2015-02-18 MED ORDER — SODIUM CHLORIDE 0.9 % IV SOLN
1700.0000 mg | Freq: Once | INTRAVENOUS | Status: AC
  Administered 2015-02-18: 1700 mg via INTRAVENOUS
  Filled 2015-02-18: qty 44.71

## 2015-02-18 MED ORDER — SODIUM CHLORIDE 0.9 % IV SOLN
Freq: Once | INTRAVENOUS | Status: AC
  Administered 2015-02-18: 14:00:00 via INTRAVENOUS
  Filled 2015-02-18: qty 8

## 2015-02-18 MED ORDER — ACETAMINOPHEN 325 MG PO TABS
ORAL_TABLET | ORAL | Status: AC
  Filled 2015-02-18: qty 2

## 2015-02-18 MED ORDER — SODIUM CHLORIDE 0.9 % IV SOLN
375.0000 mg | Freq: Once | INTRAVENOUS | Status: AC
  Administered 2015-02-18: 380 mg via INTRAVENOUS
  Filled 2015-02-18: qty 38

## 2015-02-18 MED ORDER — SODIUM CHLORIDE 0.9 % IV SOLN
Freq: Once | INTRAVENOUS | Status: AC
  Administered 2015-02-18: 13:00:00 via INTRAVENOUS

## 2015-02-18 MED ORDER — HEPARIN SOD (PORK) LOCK FLUSH 100 UNIT/ML IV SOLN
500.0000 [IU] | Freq: Once | INTRAVENOUS | Status: AC | PRN
  Administered 2015-02-18: 500 [IU]
  Filled 2015-02-18: qty 5

## 2015-02-18 NOTE — Patient Instructions (Signed)
[facility] Discharge Instructions for Patients Receiving Chemotherapy  Today you received the following chemotherapy agents: Gemzar and Carboplatin.  To help prevent nausea and vomiting after your treatment, we encourage you to take your nausea medication: Compazine. Take one every 6 hours as needed.  If you develop nausea and vomiting that is not controlled by your nausea medication, call the clinic.   BELOW ARE SYMPTOMS THAT SHOULD BE REPORTED IMMEDIATELY:  *FEVER GREATER THAN 100.5 F  *CHILLS WITH OR WITHOUT FEVER  NAUSEA AND VOMITING THAT IS NOT CONTROLLED WITH YOUR NAUSEA MEDICATION  *UNUSUAL SHORTNESS OF BREATH  *UNUSUAL BRUISING OR BLEEDING  TENDERNESS IN MOUTH AND THROAT WITH OR WITHOUT PRESENCE OF ULCERS  *URINARY PROBLEMS  *BOWEL PROBLEMS  UNUSUAL RASH Items with * indicate a potential emergency and should be followed up as soon as possible.  Feel free to call the clinic should you have any questions or concerns. The clinic phone number is [phone].  Please show the CHEMO ALERT CARD at check-in to the Emergency Department and triage nurse.   

## 2015-02-25 ENCOUNTER — Ambulatory Visit (HOSPITAL_BASED_OUTPATIENT_CLINIC_OR_DEPARTMENT_OTHER): Payer: Medicare Other

## 2015-02-25 ENCOUNTER — Other Ambulatory Visit: Payer: Self-pay | Admitting: Medical Oncology

## 2015-02-25 ENCOUNTER — Other Ambulatory Visit (HOSPITAL_BASED_OUTPATIENT_CLINIC_OR_DEPARTMENT_OTHER): Payer: Medicare Other

## 2015-02-25 VITALS — BP 123/68 | HR 92 | Temp 97.8°F | Resp 20

## 2015-02-25 DIAGNOSIS — C3412 Malignant neoplasm of upper lobe, left bronchus or lung: Secondary | ICD-10-CM | POA: Diagnosis not present

## 2015-02-25 DIAGNOSIS — C3492 Malignant neoplasm of unspecified part of left bronchus or lung: Secondary | ICD-10-CM

## 2015-02-25 DIAGNOSIS — Z5111 Encounter for antineoplastic chemotherapy: Secondary | ICD-10-CM

## 2015-02-25 LAB — CBC WITH DIFFERENTIAL/PLATELET
BASO%: 1.9 % (ref 0.0–2.0)
BASOS ABS: 0.1 10*3/uL (ref 0.0–0.1)
EOS%: 0.2 % (ref 0.0–7.0)
Eosinophils Absolute: 0 10*3/uL (ref 0.0–0.5)
HCT: 28.7 % — ABNORMAL LOW (ref 34.8–46.6)
HGB: 9.4 g/dL — ABNORMAL LOW (ref 11.6–15.9)
LYMPH#: 0.7 10*3/uL — AB (ref 0.9–3.3)
LYMPH%: 25.5 % (ref 14.0–49.7)
MCH: 30.2 pg (ref 25.1–34.0)
MCHC: 32.8 g/dL (ref 31.5–36.0)
MCV: 91.8 fL (ref 79.5–101.0)
MONO#: 0.3 10*3/uL (ref 0.1–0.9)
MONO%: 9.4 % (ref 0.0–14.0)
NEUT%: 63 % (ref 38.4–76.8)
NEUTROS ABS: 1.7 10*3/uL (ref 1.5–6.5)
PLATELETS: 167 10*3/uL (ref 145–400)
RBC: 3.13 10*6/uL — AB (ref 3.70–5.45)
RDW: 18.1 % — AB (ref 11.2–14.5)
WBC: 2.7 10*3/uL — AB (ref 3.9–10.3)

## 2015-02-25 LAB — COMPREHENSIVE METABOLIC PANEL
ALT: 33 U/L (ref 0–55)
ANION GAP: 12 meq/L — AB (ref 3–11)
AST: 36 U/L — ABNORMAL HIGH (ref 5–34)
Albumin: 3.6 g/dL (ref 3.5–5.0)
Alkaline Phosphatase: 83 U/L (ref 40–150)
BUN: 38.2 mg/dL — ABNORMAL HIGH (ref 7.0–26.0)
CHLORIDE: 100 meq/L (ref 98–109)
CO2: 25 meq/L (ref 22–29)
Calcium: 9.7 mg/dL (ref 8.4–10.4)
Creatinine: 1.3 mg/dL — ABNORMAL HIGH (ref 0.6–1.1)
EGFR: 40 mL/min/{1.73_m2} — AB (ref >90)
GLUCOSE: 91 mg/dL (ref 70–140)
POTASSIUM: 4.2 meq/L (ref 3.5–5.1)
SODIUM: 137 meq/L (ref 136–145)
Total Bilirubin: 0.41 mg/dL (ref 0.20–1.20)
Total Protein: 7.5 g/dL (ref 6.4–8.3)

## 2015-02-25 MED ORDER — SODIUM CHLORIDE 0.9 % IJ SOLN
10.0000 mL | INTRAMUSCULAR | Status: DC | PRN
  Administered 2015-02-25: 10 mL
  Filled 2015-02-25: qty 10

## 2015-02-25 MED ORDER — PROCHLORPERAZINE MALEATE 10 MG PO TABS
ORAL_TABLET | ORAL | Status: AC
  Filled 2015-02-25: qty 1

## 2015-02-25 MED ORDER — HEPARIN SOD (PORK) LOCK FLUSH 100 UNIT/ML IV SOLN
500.0000 [IU] | Freq: Once | INTRAVENOUS | Status: AC | PRN
  Administered 2015-02-25: 500 [IU]
  Filled 2015-02-25: qty 5

## 2015-02-25 MED ORDER — SODIUM CHLORIDE 0.9 % IV SOLN
Freq: Once | INTRAVENOUS | Status: AC
  Administered 2015-02-25: 13:00:00 via INTRAVENOUS

## 2015-02-25 MED ORDER — PROCHLORPERAZINE MALEATE 10 MG PO TABS
10.0000 mg | ORAL_TABLET | Freq: Once | ORAL | Status: AC
  Administered 2015-02-25: 10 mg via ORAL

## 2015-02-25 MED ORDER — SODIUM CHLORIDE 0.9 % IV SOLN
1000.0000 mg/m2 | Freq: Once | INTRAVENOUS | Status: AC
  Administered 2015-02-25: 1710 mg via INTRAVENOUS
  Filled 2015-02-25: qty 44.97

## 2015-02-25 NOTE — Patient Instructions (Signed)
[facility] Discharge Instructions for Patients Receiving Chemotherapy  Today you received the following chemotherapy agents:  Gemzar  To help prevent nausea and vomiting after your treatment, we encourage you to take your nausea medication as ordered per MD.   If you develop nausea and vomiting that is not controlled by your nausea medication, call the clinic.   BELOW ARE SYMPTOMS THAT SHOULD BE REPORTED IMMEDIATELY:  *FEVER GREATER THAN 100.5 F  *CHILLS WITH OR WITHOUT FEVER  NAUSEA AND VOMITING THAT IS NOT CONTROLLED WITH YOUR NAUSEA MEDICATION  *UNUSUAL SHORTNESS OF BREATH  *UNUSUAL BRUISING OR BLEEDING  TENDERNESS IN MOUTH AND THROAT WITH OR WITHOUT PRESENCE OF ULCERS  *URINARY PROBLEMS  *BOWEL PROBLEMS  UNUSUAL RASH Items with * indicate a potential emergency and should be followed up as soon as possible.  Feel free to call the clinic you have any questions or concerns. The clinic phone number is [phone].  Please show the CHEMO ALERT CARD at check-in to the Emergency Department and triage nurse.   

## 2015-02-25 NOTE — Progress Notes (Signed)
Starting Friday afternoon, patient had one episode of nausea/vomiting/diarrhea on the weekend.   She felt better Monday and Tuesday felt pretty normal.

## 2015-02-26 ENCOUNTER — Telehealth: Payer: Self-pay | Admitting: Medical Oncology

## 2015-02-26 NOTE — Telephone Encounter (Signed)
[id number] Follow-up: 3 months  Patient in follow-up: I called patient for her 3 month follow-up on study call. I spoke with her significant other, Shela Nevin. Mr. Gaetana Michaelis stated patient was laying down, asleep. He states that patient had treatment yesterday and the treatment makes her tired. I inquired how patient was, he states her current treatment makes her tired for a few days after and then by the third or fourth day following she starts to feel a bit better. I inquired as to whether patient was still being seen by Dr. Buddy Duty regarding her thyroid, he confirms that she is, but he could not tell me if she was receiving any new thyroid medication. I thanked Mr. Gaetana Michaelis for his time and asked him to say hello to Ms. Sooy and should they have any questions, to please call Dr. [name] or myself.  Fax sent to Dr. Cindra Eves office requesting medical records r/t thyroid treatment follow-up, Authorization release form attached to fax.  [name], RN, BSN Clinical Research 02/26/2015 3:40 PM

## 2015-03-04 ENCOUNTER — Other Ambulatory Visit (HOSPITAL_BASED_OUTPATIENT_CLINIC_OR_DEPARTMENT_OTHER): Payer: Medicare Other

## 2015-03-04 ENCOUNTER — Other Ambulatory Visit: Payer: Self-pay | Admitting: *Deleted

## 2015-03-04 ENCOUNTER — Telehealth: Payer: Self-pay | Admitting: *Deleted

## 2015-03-04 DIAGNOSIS — D6489 Other specified anemias: Secondary | ICD-10-CM

## 2015-03-04 DIAGNOSIS — C3412 Malignant neoplasm of upper lobe, left bronchus or lung: Secondary | ICD-10-CM | POA: Diagnosis not present

## 2015-03-04 DIAGNOSIS — C3492 Malignant neoplasm of unspecified part of left bronchus or lung: Secondary | ICD-10-CM

## 2015-03-04 LAB — CBC WITH DIFFERENTIAL/PLATELET
BASO%: 0.8 % (ref 0.0–2.0)
BASOS ABS: 0 10*3/uL (ref 0.0–0.1)
EOS ABS: 0 10*3/uL (ref 0.0–0.5)
EOS%: 1.7 % (ref 0.0–7.0)
HEMATOCRIT: 22.6 % — AB (ref 34.8–46.6)
HGB: 7.5 g/dL — ABNORMAL LOW (ref 11.6–15.9)
LYMPH%: 56.8 % — AB (ref 14.0–49.7)
MCH: 30 pg (ref 25.1–34.0)
MCHC: 33.2 g/dL (ref 31.5–36.0)
MCV: 90.4 fL (ref 79.5–101.0)
MONO#: 0.1 10*3/uL (ref 0.1–0.9)
MONO%: 5.1 % (ref 0.0–14.0)
NEUT#: 0.4 10*3/uL — CL (ref 1.5–6.5)
NEUT%: 35.6 % — AB (ref 38.4–76.8)
PLATELETS: 19 10*3/uL — AB (ref 145–400)
RBC: 2.5 10*6/uL — ABNORMAL LOW (ref 3.70–5.45)
RDW: 17.1 % — ABNORMAL HIGH (ref 11.2–14.5)
WBC: 1.2 10*3/uL — ABNORMAL LOW (ref 3.9–10.3)
lymph#: 0.7 10*3/uL — ABNORMAL LOW (ref 0.9–3.3)
nRBC: 0 % (ref 0–0)

## 2015-03-04 LAB — COMPREHENSIVE METABOLIC PANEL
ALK PHOS: 91 U/L (ref 40–150)
ALT: 27 U/L (ref 0–55)
ANION GAP: 10 meq/L (ref 3–11)
AST: 27 U/L (ref 5–34)
Albumin: 3.5 g/dL (ref 3.5–5.0)
BUN: 31.3 mg/dL — ABNORMAL HIGH (ref 7.0–26.0)
CALCIUM: 9.2 mg/dL (ref 8.4–10.4)
CHLORIDE: 102 meq/L (ref 98–109)
CO2: 26 mEq/L (ref 22–29)
Creatinine: 1.1 mg/dL (ref 0.6–1.1)
EGFR: 49 mL/min/{1.73_m2} — AB (ref >90)
Glucose: 83 mg/dl (ref 70–140)
POTASSIUM: 3.9 meq/L (ref 3.5–5.1)
Sodium: 138 mEq/L (ref 136–145)
Total Bilirubin: 0.36 mg/dL (ref 0.20–1.20)
Total Protein: 7 g/dL (ref 6.4–8.3)

## 2015-03-04 NOTE — Telephone Encounter (Signed)
-----   Message from Curt Bears, MD sent at 03/04/2015  2:47 PM EST ----- Call patient with the result and arrange for 2 units of PRBCs transfusion tomorrow if possible

## 2015-03-04 NOTE — Telephone Encounter (Signed)
Called pt, spoke with Significant other as pt unable to come to phone. Message left for pt- to arrive tomorrow at 0830 for 2 units of blood. He advised he would give her the message No further concerns.

## 2015-03-04 NOTE — Progress Notes (Signed)
Quick Note:  Call patient with the result and arrange for 2 units of PRBCs transfusion tomorrow if possible ______

## 2015-03-05 ENCOUNTER — Ambulatory Visit (HOSPITAL_BASED_OUTPATIENT_CLINIC_OR_DEPARTMENT_OTHER): Payer: Medicare Other

## 2015-03-05 ENCOUNTER — Ambulatory Visit (HOSPITAL_COMMUNITY)
Admission: RE | Admit: 2015-03-05 | Discharge: 2015-03-05 | Disposition: A | Discharge status: Home / Self Care | Payer: Medicare Other | Source: Ambulatory Visit | Attending: Internal Medicine | Admitting: Internal Medicine

## 2015-03-05 VITALS — BP 141/85 | HR 82 | Temp 98.1°F | Resp 22

## 2015-03-05 DIAGNOSIS — D649 Anemia, unspecified: Secondary | ICD-10-CM

## 2015-03-05 DIAGNOSIS — D6489 Other specified anemias: Secondary | ICD-10-CM | POA: Diagnosis not present

## 2015-03-05 LAB — PREPARE RBC (CROSSMATCH)

## 2015-03-05 MED ORDER — DIPHENHYDRAMINE HCL 25 MG PO CAPS
25.0000 mg | ORAL_CAPSULE | Freq: Once | ORAL | Status: AC
  Administered 2015-03-05: 25 mg via ORAL

## 2015-03-05 MED ORDER — SODIUM CHLORIDE 0.9% FLUSH
10.0000 mL | INTRAVENOUS | Status: AC | PRN
  Administered 2015-03-05: 10 mL
  Filled 2015-03-05: qty 10

## 2015-03-05 MED ORDER — ACETAMINOPHEN 325 MG PO TABS
650.0000 mg | ORAL_TABLET | Freq: Once | ORAL | Status: AC
  Administered 2015-03-05: 650 mg via ORAL

## 2015-03-05 MED ORDER — HEPARIN SOD (PORK) LOCK FLUSH 100 UNIT/ML IV SOLN
500.0000 [IU] | Freq: Every day | INTRAVENOUS | Status: AC | PRN
  Administered 2015-03-05: 500 [IU]
  Filled 2015-03-05: qty 5

## 2015-03-05 MED ORDER — DIPHENHYDRAMINE HCL 25 MG PO CAPS
ORAL_CAPSULE | ORAL | Status: AC
  Filled 2015-03-05: qty 1

## 2015-03-05 MED ORDER — ACETAMINOPHEN 325 MG PO TABS
ORAL_TABLET | ORAL | Status: AC
  Filled 2015-03-05: qty 2

## 2015-03-05 MED ORDER — SODIUM CHLORIDE 0.9 % IV SOLN
250.0000 mL | Freq: Once | INTRAVENOUS | Status: AC
  Administered 2015-03-05: 250 mL via INTRAVENOUS

## 2015-03-05 NOTE — Patient Instructions (Signed)

## 2015-03-06 ENCOUNTER — Other Ambulatory Visit: Payer: Self-pay | Admitting: Internal Medicine

## 2015-03-06 DIAGNOSIS — D6489 Other specified anemias: Secondary | ICD-10-CM | POA: Diagnosis not present

## 2015-03-06 LAB — TYPE AND SCREEN
ABO/RH(D): O POS
Antibody Screen: NEGATIVE
UNIT DIVISION: 0
UNIT DIVISION: 0

## 2015-03-11 ENCOUNTER — Ambulatory Visit (HOSPITAL_BASED_OUTPATIENT_CLINIC_OR_DEPARTMENT_OTHER): Payer: Medicare Other | Admitting: Internal Medicine

## 2015-03-11 ENCOUNTER — Encounter: Payer: Self-pay | Admitting: Medical Oncology

## 2015-03-11 ENCOUNTER — Telehealth: Payer: Self-pay | Admitting: *Deleted

## 2015-03-11 ENCOUNTER — Encounter: Payer: Self-pay | Admitting: Internal Medicine

## 2015-03-11 ENCOUNTER — Other Ambulatory Visit (HOSPITAL_BASED_OUTPATIENT_CLINIC_OR_DEPARTMENT_OTHER): Payer: Medicare Other

## 2015-03-11 ENCOUNTER — Other Ambulatory Visit: Payer: Self-pay | Admitting: Internal Medicine

## 2015-03-11 ENCOUNTER — Encounter: Payer: Self-pay | Admitting: *Deleted

## 2015-03-11 ENCOUNTER — Ambulatory Visit (HOSPITAL_BASED_OUTPATIENT_CLINIC_OR_DEPARTMENT_OTHER): Payer: Medicare Other

## 2015-03-11 ENCOUNTER — Other Ambulatory Visit (HOSPITAL_COMMUNITY)
Admission: RE | Admit: 2015-03-11 | Discharge: 2015-03-11 | Disposition: A | Discharge status: Home / Self Care | Payer: Medicare Other | Source: Ambulatory Visit | Attending: Internal Medicine | Admitting: Internal Medicine

## 2015-03-11 VITALS — BP 152/86 | HR 89 | Temp 97.8°F | Resp 20 | Wt 144.0 lb

## 2015-03-11 DIAGNOSIS — C3412 Malignant neoplasm of upper lobe, left bronchus or lung: Secondary | ICD-10-CM | POA: Diagnosis not present

## 2015-03-11 DIAGNOSIS — C3490 Malignant neoplasm of unspecified part of unspecified bronchus or lung: Secondary | ICD-10-CM | POA: Diagnosis present

## 2015-03-11 DIAGNOSIS — T451X5A Adverse effect of antineoplastic and immunosuppressive drugs, initial encounter: Secondary | ICD-10-CM

## 2015-03-11 DIAGNOSIS — Z5111 Encounter for antineoplastic chemotherapy: Secondary | ICD-10-CM | POA: Diagnosis not present

## 2015-03-11 DIAGNOSIS — C782 Secondary malignant neoplasm of pleura: Secondary | ICD-10-CM

## 2015-03-11 DIAGNOSIS — C3492 Malignant neoplasm of unspecified part of left bronchus or lung: Secondary | ICD-10-CM

## 2015-03-11 DIAGNOSIS — D701 Agranulocytosis secondary to cancer chemotherapy: Secondary | ICD-10-CM

## 2015-03-11 DIAGNOSIS — R0781 Pleurodynia: Secondary | ICD-10-CM | POA: Diagnosis not present

## 2015-03-11 LAB — CBC WITH DIFFERENTIAL/PLATELET
BASO%: 0.7 % (ref 0.0–2.0)
BASOS ABS: 0 10*3/uL (ref 0.0–0.1)
EOS ABS: 0.1 10*3/uL (ref 0.0–0.5)
EOS%: 2.5 % (ref 0.0–7.0)
HEMATOCRIT: 33.1 % — AB (ref 34.8–46.6)
HEMOGLOBIN: 10.7 g/dL — AB (ref 11.6–15.9)
LYMPH%: 21.8 % (ref 14.0–49.7)
MCH: 29.9 pg (ref 25.1–34.0)
MCHC: 32.3 g/dL (ref 31.5–36.0)
MCV: 92.5 fL (ref 79.5–101.0)
MONO#: 0.6 10*3/uL (ref 0.1–0.9)
MONO%: 21.8 % — ABNORMAL HIGH (ref 0.0–14.0)
NEUT%: 53.2 % (ref 38.4–76.8)
NEUTROS ABS: 1.5 10*3/uL (ref 1.5–6.5)
PLATELETS: 259 10*3/uL (ref 145–400)
RBC: 3.58 10*6/uL — ABNORMAL LOW (ref 3.70–5.45)
RDW: 16.5 % — AB (ref 11.2–14.5)
WBC: 2.8 10*3/uL — AB (ref 3.9–10.3)
lymph#: 0.6 10*3/uL — ABNORMAL LOW (ref 0.9–3.3)

## 2015-03-11 LAB — COMPREHENSIVE METABOLIC PANEL
ALBUMIN: 3.6 g/dL (ref 3.5–5.0)
ALK PHOS: 90 U/L (ref 40–150)
ALT: 20 U/L (ref 0–55)
ANION GAP: 11 meq/L (ref 3–11)
AST: 22 U/L (ref 5–34)
BILIRUBIN TOTAL: 0.37 mg/dL (ref 0.20–1.20)
BUN: 16.7 mg/dL (ref 7.0–26.0)
CALCIUM: 9.2 mg/dL (ref 8.4–10.4)
CO2: 28 mEq/L (ref 22–29)
Chloride: 100 mEq/L (ref 98–109)
Creatinine: 1.1 mg/dL (ref 0.6–1.1)
EGFR: 53 mL/min/{1.73_m2} — AB (ref >90)
GLUCOSE: 129 mg/dL (ref 70–140)
POTASSIUM: 4.2 meq/L (ref 3.5–5.1)
Sodium: 139 mEq/L (ref 136–145)
TOTAL PROTEIN: 7 g/dL (ref 6.4–8.3)

## 2015-03-11 MED ORDER — SODIUM CHLORIDE 0.9 % IJ SOLN
10.0000 mL | INTRAMUSCULAR | Status: DC | PRN
  Administered 2015-03-11: 10 mL
  Filled 2015-03-11: qty 10

## 2015-03-11 MED ORDER — CARBOPLATIN CHEMO INTRADERMAL TEST DOSE 100MCG/0.02ML
100.0000 ug | Freq: Once | INTRADERMAL | Status: AC
  Administered 2015-03-11: 100 ug via INTRADERMAL
  Filled 2015-03-11: qty 0.01

## 2015-03-11 MED ORDER — HEPARIN SOD (PORK) LOCK FLUSH 100 UNIT/ML IV SOLN
500.0000 [IU] | Freq: Once | INTRAVENOUS | Status: AC | PRN
  Administered 2015-03-11: 500 [IU]
  Filled 2015-03-11: qty 5

## 2015-03-11 MED ORDER — SODIUM CHLORIDE 0.9 % IV SOLN
300.0000 mg | Freq: Once | INTRAVENOUS | Status: AC
  Administered 2015-03-11: 300 mg via INTRAVENOUS
  Filled 2015-03-11: qty 30

## 2015-03-11 MED ORDER — SODIUM CHLORIDE 0.9 % IV SOLN
Freq: Once | INTRAVENOUS | Status: AC
  Administered 2015-03-11: 14:00:00 via INTRAVENOUS
  Filled 2015-03-11: qty 8

## 2015-03-11 MED ORDER — SODIUM CHLORIDE 0.9 % IV SOLN
Freq: Once | INTRAVENOUS | Status: AC
  Administered 2015-03-11: 14:00:00 via INTRAVENOUS

## 2015-03-11 MED ORDER — GEMCITABINE HCL CHEMO INJECTION 1 GM/26.3ML
800.0000 mg/m2 | Freq: Once | INTRAVENOUS | Status: AC
  Administered 2015-03-11: 1368 mg via INTRAVENOUS
  Filled 2015-03-11: qty 35.98

## 2015-03-11 NOTE — Telephone Encounter (Signed)
Per staff message and POF I have scheduled appts. Advised scheduler of appts. JMW  

## 2015-03-11 NOTE — Progress Notes (Signed)
BMS370: Group B, Arm A I met with patient and significant other today to follow up phone call to patient on the 19th of January. Patient is off active treatment with study and in follow-up and phone call was the 3 month follow-up time. Patient was asleep when I called and spoke to Mr. Slover. Patient completed 3 month study questionnaire [id number] today with research assistant, Anna Hurd prior to my seeing her. I inquired with patient regarding thyroid follow up with Dr. Kerr. Patient reports she had a scheduled appt with him on January 26th that she had to cancel due to needing to receive PRBC transfusion here at the clinic and she states that she needs to reschedule with Dr. Kerr. Patient confirms to have stopped taking methimazole approximately January 4th 2017. Per Dr. Kerr's office lab results, TSH and Free T4 were within normal levels on 12/15. Patient reported she was told to take "1/2 tablet" but she decided to stop. I encouraged patient to follow-up with Dr. Kerr regarding her thyroid and discontinuing of medication. Patient was asked when her methimazole prescription was decreased, she states she could not remember. I informed patient of study having a new informed consent form and need to review with her and reconsent. Plans were made to review and reconsent next week due to time restriction today. All patient's questions were answered to her satisfaction, patient denies further questions at this time. I thanked patient and Mr. Slover for their time and continued support of study and encouraged them both to call Dr. Mohamed or myself with any questions or concerns they may have.  Patient's office visit med rec requested from Dr. Shaw and Dr. Kerr's office with use of patient signed ROI. Per Dr. Shaw's note on 12/18/2014, methimazole was at 10 mg daily.  Leonetti, Mirjana, RN, BSN Clinical Research 03/11/2015 4:50 PM  

## 2015-03-11 NOTE — Progress Notes (Signed)
Oncology Nurse Navigator Documentation  Oncology Nurse Navigator Flowsheets 03/11/2015  Navigator Encounter Type Other/per Dr. Worthy Flank request, I emailed pathology dept and requested tissue from 12/03/13 to be sent for PDL 1 Keytruda testing.   Acuity Level 2  Time Spent with Patient 15

## 2015-03-11 NOTE — Patient Instructions (Signed)
[facility] Discharge Instructions for Patients Receiving Chemotherapy  Today you received the following chemotherapy agents Gemzar, Carboplatin.   To help prevent nausea and vomiting after your treatment, we encourage you to take your nausea medication as prescribed.    If you develop nausea and vomiting that is not controlled by your nausea medication, call the clinic.   BELOW ARE SYMPTOMS THAT SHOULD BE REPORTED IMMEDIATELY:  *FEVER GREATER THAN 100.5 F  *CHILLS WITH OR WITHOUT FEVER  NAUSEA AND VOMITING THAT IS NOT CONTROLLED WITH YOUR NAUSEA MEDICATION  *UNUSUAL SHORTNESS OF BREATH  *UNUSUAL BRUISING OR BLEEDING  TENDERNESS IN MOUTH AND THROAT WITH OR WITHOUT PRESENCE OF ULCERS  *URINARY PROBLEMS  *BOWEL PROBLEMS  UNUSUAL RASH Items with * indicate a potential emergency and should be followed up as soon as possible.  Feel free to call the clinic you have any questions or concerns. The clinic phone number is [phone].  Please show the CHEMO ALERT CARD at check-in to the Emergency Department and triage nurse.   

## 2015-03-11 NOTE — Progress Notes (Signed)
[facility] [phone]   [phone] [place] [address] [place] [address]  DIAGNOSIS: Stage IV (T2, N2, M1a) non-small cell lung cancer, squamous cell carcinoma presented with central left upper lobe lung mass with associated postobstructive atelectasis as well as ipsilateral and subcarinal lymphadenopathy as well as pleural tumor spread diagnosed in October 2015.  PRIOR THERAPY:  1) Systemic chemotherapy with carboplatin for AUC of 5 on day 1 and Abraxane 100 MG/M2 on days 1, 8 and 15 every 3 weeks. Status post 6 cycles.  2) Nivolumab per research protocol BMS 370 group B, arm A, given every 2 weeks. Status post 7 cycles. Last dose was given 08/13/2014 discontinued on 08/28/2014 secondary to disease progression. 3) Systemic chemotherapy with docetaxel 75 MG/M2 and Cyramza 10 MG/KG every 3 weeks. First dose 09/10/2014. Status post 6 cycles. Discontinued secondary to disease progression   CURRENT THERAPY: Systemic chemotherapy with carboplatin for AUC of 5 on day 1 and gemcitabine 1000 MG/M2 on days 1 and 8 every 3 weeks, status post 2 cycles.  INTERVAL HISTORY: Tammie Clay 70 y.o. female returns to the clinic today for follow-up visit accompanied by her boyfriend. The patient is feeling fine today except for fatigue and intermittent pain at the left lower rib cage. She is tolerating her current treatment with carboplatin and gemcitabine fairly well except for the fatigue. She denied having any weight loss or night sweats. The patient denied having any chest pain, shortness of breath, cough or hemoptysis. She has no bleeding issues. She has no nausea or vomiting and no fever or chills. The patient is here today to start cycle #3 of her systemic chemotherapy.  MEDICAL HISTORY: Past Medical History  Diagnosis Date  . Hypertension   . COPD (chronic obstructive pulmonary disease) ([place])   .  Hyperlipidemia   . GERD (gastroesophageal reflux disease)   . Osteopenia   . Insomnia   . Mediastinal lymphadenopathy 11/19/13    PER CT  . Multiple lung nodules on CT 11/19/13    LEFT LUNG BASE  . Lung mass 11/19/13    RUL PER CT  . Coronary artery calcification seen on CAT scan 11/21/2013  . COPD (chronic obstructive pulmonary disease) ([place]) 11/21/2013  . Pneumonia     hx of  . Anxiety   . stage iv sq cell lung ca dx'd 11/2013    Recist   . Encounter for antineoplastic chemotherapy 12/03/2014  . Chemotherapy induced neutropenia ([place]) 02/11/2015    ALLERGIES:  has No Known Allergies.  MEDICATIONS:  Current Outpatient Prescriptions  Medication Sig Dispense Refill  . acetaminophen (TYLENOL) 325 MG tablet Take 650 mg by mouth daily as needed for moderate pain or headache. Take 2 tablets every morning    . albuterol (PROVENTIL HFA;VENTOLIN HFA) 108 (90 BASE) MCG/ACT inhaler Inhale 2 puffs into the lungs every 6 (six) hours as needed for wheezing.    . budesonide-formoterol (SYMBICORT) 160-4.5 MCG/ACT inhaler Inhale 2 puffs into the lungs 2 (two) times daily.    . calcium carbonate (TUMS - DOSED IN MG ELEMENTAL CALCIUM) 500 MG chewable tablet Chew 1 tablet by mouth daily as needed for indigestion or heartburn.     . diphenoxylate-atropine (LOMOTIL) 2.5-0.025 MG tablet TAKE 2 TABLETS 4 TIMES A DAY AS NEEDED FOR DIARRHEA 30 tablet 0  . docusate sodium (COLACE) 100 MG capsule Take 100 mg by mouth 2 (two) times daily  as needed for moderate constipation.     . DULoxetine (CYMBALTA) 30 MG capsule TAKE ONE CAPSULE BY MOUTH EVERY DAY 30 capsule 2  . Fexofenadine HCl (ALLEGRA ALLERGY PO) Take by mouth once.    Marland Kitchen HYDROcodone-acetaminophen (NORCO) 10-325 MG per tablet Take 1 tablet by mouth every 6 (six) hours as needed for moderate pain or severe pain.     Marland Kitchen lidocaine-prilocaine (EMLA) cream Apply 1 application topically as needed. 30 g 0  . lisinopril-hydrochlorothiazide (PRINZIDE,ZESTORETIC)  20-12.5 MG per tablet Take 2 tablets by mouth every morning.     . loperamide (IMODIUM) 2 MG capsule Take by mouth as needed for diarrhea or loose stools.    Marland Kitchen LORazepam (ATIVAN) 2 MG tablet Take 2 mg by mouth at bedtime as needed. Take 1-2 tabs by mouth at bedtimes as needed for insomnia  0  . metoprolol succinate (TOPROL-XL) 50 MG 24 hr tablet Take 50 mg by mouth every morning. Take with or immediately following a meal.    . Multiple Vitamin (MULTIVITAMIN WITH MINERALS) TABS Take 1 tablet by mouth daily.    . polyethylene glycol (MIRALAX / GLYCOLAX) packet Take 17 g by mouth daily as needed for moderate constipation or severe constipation.     . prochlorperazine (COMPAZINE) 10 MG tablet Take 1 tablet (10 mg total) by mouth every 6 (six) hours as needed for nausea or vomiting. 30 tablet 1  . ranitidine (ZANTAC) 150 MG tablet Take 150 mg by mouth 2 (two) times daily as needed for heartburn.     . SYMBICORT 80-4.5 MCG/ACT inhaler 2 Inhalers.  5  . mirtazapine (REMERON) 15 MG tablet Take 15 mg by mouth at bedtime. Reported on 03/11/2015  3   No current facility-administered medications for this visit.    SURGICAL HISTORY:  Past Surgical History  Procedure Laterality Date  .  c section x 2    . Urethral cyst removed    . Tonsillectomy    . Appendectomy    . Colonoscopy with propofol N/A 07/17/2012    Procedure: COLONOSCOPY WITH PROPOFOL;  Surgeon: Garlan Fair, MD;  Location: [facility];  Service: Endoscopy;  Laterality: N/A;  . Esophagogastroduodenoscopy (egd) with propofol N/A 07/17/2012    Procedure: ESOPHAGOGASTRODUODENOSCOPY (EGD) WITH PROPOFOL;  Surgeon: Garlan Fair, MD;  Location: [facility];  Service: Endoscopy;  Laterality: N/A;  . Video bronchoscopy with endobronchial ultrasound N/A 12/03/2013    Procedure: VIDEO BRONCHOSCOPY WITH ENDOBRONCHIAL ULTRASOUND WITH BIOPSIES;  Surgeon: Grace Isaac, MD;  Location: [facility];  Service: Thoracic;  Laterality: N/A;  . Portacath  placement Left 12/10/2013    Procedure: INSERTION PORT-A-CATH;  Surgeon: Grace Isaac, MD;  Location: [facility];  Service: Thoracic;  Laterality: Left;    REVIEW OF SYSTEMS:  A comprehensive review of systems was negative except for: Constitutional: positive for fatigue Respiratory: positive for pleurisy/chest pain   PHYSICAL EXAMINATION: General appearance: alert, cooperative, fatigued and no distress Head: Normocephalic, without obvious abnormality, atraumatic Neck: no adenopathy, no JVD, supple, symmetrical, trachea midline and thyroid not enlarged, symmetric, no tenderness/mass/nodules Lymph nodes: Cervical, supraclavicular, and axillary nodes normal. Resp: clear to auscultation bilaterally Back: symmetric, no curvature. ROM normal. No CVA tenderness. Cardio: regular rate and rhythm, S1, S2 normal, no murmur, click, rub or gallop GI: soft, non-tender; bowel sounds normal; no masses,  no organomegaly Genitalia: defer exam Extremities: extremities normal, atraumatic, no cyanosis or edema Neurologic: Alert and oriented X 3, normal strength and tone. Normal symmetric reflexes. Normal  coordination and gait  ECOG PERFORMANCE STATUS: 1 - Symptomatic but completely ambulatory  Blood pressure 152/86, pulse 89, temperature 97.8 F (36.6 C), temperature source Oral, resp. rate 20, weight 144 lb (65.318 kg), SpO2 100 %.  LABORATORY DATA: Lab Results  Component Value Date   WBC 2.8* 03/11/2015   HGB 10.7* 03/11/2015   HCT 33.1* 03/11/2015   MCV 92.5 03/11/2015   PLT 259 03/11/2015      Chemistry      Component Value Date/Time   NA 139 03/11/2015 1026   NA 135 07/19/2014 1607   K 4.2 03/11/2015 1026   K 3.4* 07/19/2014 1607   CL 99* 07/19/2014 1607   CO2 28 03/11/2015 1026   CO2 28 07/19/2014 1607   BUN 16.7 03/11/2015 1026   BUN 21* 07/19/2014 1607   CREATININE 1.1 03/11/2015 1026   CREATININE 1.01* 07/19/2014 1607   CREATININE 0.80 11/22/2013 1054      Component Value  Date/Time   CALCIUM 9.2 03/11/2015 1026   CALCIUM 8.7* 07/19/2014 1607   ALKPHOS 90 03/11/2015 1026   ALKPHOS 77 12/10/2013 0637   AST 22 03/11/2015 1026   AST 16 12/10/2013 0637   ALT 20 03/11/2015 1026   ALT 16 12/10/2013 0637   BILITOT 0.37 03/11/2015 1026   BILITOT 0.4 12/10/2013 0637       RADIOGRAPHIC STUDIES: No results found.  ASSESSMENT AND PLAN: This is a very pleasant 70 years old white female recently diagnosed with:  1) Stage IV non-small cell lung cancer, squamous cell carcinoma completed systemic chemotherapy with carboplatin and Abraxane status post 6 cycles.  She is currently on treatment with immunotherapy according to the BMS checkmate 370 clinical trial with Nivolumab.she status post 7 cycles and tolerating her last treatment fairly well with no significant diarrhea. Her treatment was also discontinued secondary to disease progression. The patient was started on systemic chemotherapy with docetaxel and surrounds is status post 6 cycles and tolerating her treatment well except for fatigue and pain from the Neulasta injection. This treatment was discontinued secondary to disease progression. The patient is currently undergoing systemic chemotherapy with carboplatin for AUC of 5 on day 1 and gemcitabine 1000 MG/M2 on days 1 and 8 every 3 weeks is status post 2 cycles. She tolerated the last cycle of her treatment well except for fatigue. I recommended for the patient to proceed with cycle #3 today as scheduled. She would come back for follow-up visit in 3 weeks for evaluation after repeating CT scan of the chest, abdomen and pelvis for restaging of her disease. For the left lower rib cage pain, the patient will continue on Vicodin for now. She has interest in treatment was continued in the future. I will ask the pathology department to check her PDL 1 status. The patient was advised to call immediately if she has any concerning symptoms in the interval.  The patient  voices understanding of current disease status and treatment options and is in agreement with the current care plan.  All questions were answered. The patient knows to call the clinic with any problems, questions or concerns. We can certainly see the patient much sooner if necessary.  Disclaimer: This note was dictated with voice recognition software. Similar sounding words can inadvertently be transcribed and may not be corrected upon review.

## 2015-03-12 ENCOUNTER — Telehealth: Payer: Self-pay | Admitting: *Deleted

## 2015-03-12 NOTE — Telephone Encounter (Signed)
Per staff message and POF I have scheduled appts. Advised scheduler of appts. JMW  

## 2015-03-16 ENCOUNTER — Encounter (HOSPITAL_COMMUNITY): Payer: Self-pay

## 2015-03-18 ENCOUNTER — Encounter: Payer: Medicare Other | Admitting: Nutrition

## 2015-03-18 ENCOUNTER — Other Ambulatory Visit (HOSPITAL_BASED_OUTPATIENT_CLINIC_OR_DEPARTMENT_OTHER): Payer: Medicare Other

## 2015-03-18 ENCOUNTER — Ambulatory Visit: Payer: Medicare Other

## 2015-03-18 DIAGNOSIS — C3492 Malignant neoplasm of unspecified part of left bronchus or lung: Secondary | ICD-10-CM | POA: Diagnosis not present

## 2015-03-18 LAB — CBC WITH DIFFERENTIAL/PLATELET
BASO%: 1.9 % (ref 0.0–2.0)
Basophils Absolute: 0 10*3/uL (ref 0.0–0.1)
EOS%: 0.6 % (ref 0.0–7.0)
Eosinophils Absolute: 0 10*3/uL (ref 0.0–0.5)
HCT: 30.3 % — ABNORMAL LOW (ref 34.8–46.6)
HGB: 10 g/dL — ABNORMAL LOW (ref 11.6–15.9)
LYMPH%: 38.3 % (ref 14.0–49.7)
MCH: 29.9 pg (ref 25.1–34.0)
MCHC: 33 g/dL (ref 31.5–36.0)
MCV: 90.7 fL (ref 79.5–101.0)
MONO#: 0.2 10*3/uL (ref 0.1–0.9)
MONO%: 10.5 % (ref 0.0–14.0)
NEUT#: 0.8 10*3/uL — ABNORMAL LOW (ref 1.5–6.5)
NEUT%: 48.7 % (ref 38.4–76.8)
Platelets: 184 10*3/uL (ref 145–400)
RBC: 3.34 10*6/uL — AB (ref 3.70–5.45)
RDW: 16.5 % — ABNORMAL HIGH (ref 11.2–14.5)
WBC: 1.6 10*3/uL — ABNORMAL LOW (ref 3.9–10.3)
lymph#: 0.6 10*3/uL — ABNORMAL LOW (ref 0.9–3.3)

## 2015-03-18 LAB — COMPREHENSIVE METABOLIC PANEL
ALT: 19 U/L (ref 0–55)
ANION GAP: 13 meq/L — AB (ref 3–11)
AST: 30 U/L (ref 5–34)
Albumin: 3.6 g/dL (ref 3.5–5.0)
Alkaline Phosphatase: 92 U/L (ref 40–150)
BILIRUBIN TOTAL: 0.31 mg/dL (ref 0.20–1.20)
BUN: 26.3 mg/dL — ABNORMAL HIGH (ref 7.0–26.0)
CALCIUM: 9.4 mg/dL (ref 8.4–10.4)
CHLORIDE: 98 meq/L (ref 98–109)
CO2: 26 mEq/L (ref 22–29)
CREATININE: 1.2 mg/dL — AB (ref 0.6–1.1)
EGFR: 47 mL/min/{1.73_m2} — AB (ref >90)
Glucose: 136 mg/dl (ref 70–140)
Potassium: 4.1 mEq/L (ref 3.5–5.1)
SODIUM: 137 meq/L (ref 136–145)
Total Protein: 7.2 g/dL (ref 6.4–8.3)

## 2015-03-18 NOTE — Progress Notes (Signed)
ANC 0.8. Per Dr. [name], patient may either skip today's treatment, or complete treatment and come back tomorrow for neulasta. Patient has a wedding shower on Saturday and opts to skip this week's treatment.  Neutropenic precautions explained to patient, patient given masks and voices understanding of teaching.

## 2015-03-18 NOTE — Patient Instructions (Signed)
Neutropenia Neutropenia is a condition that occurs when the level of a certain type of white blood cell (neutrophil) in your body becomes lower than normal. Neutrophils are made in the bone marrow and fight infections. These cells protect against bacteria and viruses. The fewer neutrophils you have, and the longer your body remains without them, the greater your risk of getting a severe infection becomes. CAUSES  The cause of neutropenia may be hard to determine. However, it is usually due to 3 main problems:   Decreased production of neutrophils. This may be due to:  Certain medicines such as chemotherapy.  Genetic problems.  Cancer.  Radiation treatments.  Vitamin deficiency.  Some pesticides.  Increased destruction of neutrophils. This may be due to:  Overwhelming infections.  Hemolytic anemia. This is when the body destroys its own blood cells.  Chemotherapy.  Neutrophils moving to areas of the body where they cannot fight infections. This may be due to:  Dialysis procedures.  Conditions where the spleen becomes enlarged. Neutrophils are held in the spleen and are not available to the rest of the body.  Overwhelming infections. The neutrophils are held in the area of the infection and are not available to the rest of the body. SYMPTOMS  There are no specific symptoms of neutropenia. The lack of neutrophils can result in an infection, and an infection can cause various problems. DIAGNOSIS  Diagnosis is made by a blood test. A complete blood count is performed. The normal level of neutrophils in human blood differs with age and race. Infants have lower counts than older children and adults. African Americans have lower counts than Caucasians or Asians. The average adult level is 1500 cells/mm3 of blood. Neutrophil counts are interpreted as follows:  Greater than 1000 cells/mm3 gives normal protection against infection.  500 to 1000 cells/mm3 gives an increased risk for  infection.  200 to 500 cells/mm3 is a greater risk for severe infection.  Lower than 200 cells/mm3 is a marked risk of infection. This may require hospitalization and treatment with antibiotic medicines. TREATMENT  Treatment depends on the underlying cause, severity, and presence of infections or symptoms. It also depends on your health. Your caregiver will discuss the treatment plan with you. Mild cases are often easily treated and have a good outcome. Preventative measures may also be started to limit your risk of infections. Treatment can include:  Taking antibiotics.  Stopping medicines that are known to cause neutropenia.  Correcting nutritional deficiencies by eating green vegetables to supply folic acid and taking vitamin B supplements.  Stopping exposure to pesticides if your neutropenia is related to pesticide exposure.  Taking a blood growth factor called sargramostim, pegfilgrastim, or filgrastim if you are undergoing chemotherapy for cancer. This stimulates white blood cell production.  Removal of the spleen if you have Felty's syndrome and have repeated infections. HOME CARE INSTRUCTIONS   Follow your caregiver's instructions about when you need to have blood work done.  Wash your hands often. Make sure others who come in contact with you also wash their hands.  Wash raw fruits and vegetables before eating them. They can carry bacteria and fungi.  Avoid people with colds or spreadable (contagious) diseases (chickenpox, herpes zoster, influenza).  Avoid large crowds.  Avoid construction areas. The dust can release fungus into the air.  Be cautious around children in daycare or school environments.  Take care of your respiratory system by coughing and deep breathing.  Bathe daily.  Protect your skin from cuts and   burns.  Do not work in the garden or with flowers and plants.  Care for the mouth before and after meals by brushing with a soft toothbrush. If you have  mucositis, do not use mouthwash. Mouthwash contains alcohol and can dry out the mouth even more.  Clean the area between the genitals and the anus (perineal area) after urination and bowel movements. Women need to wipe from front to back.  Use a water soluble lubricant during sexual intercourse and practice good hygiene after. Do not have intercourse if you are severely neutropenic. Check with your caregiver for guidelines.  Exercise daily as tolerated.  Avoid people who were vaccinated with a live vaccine in the past 30 days. You should not receive live vaccines (polio, typhoid).  Do not provide direct care for pets. Avoid animal droppings. Do not clean litter boxes and bird cages.  Do not share food utensils.  Do not use tampons, enemas, or rectal suppositories unless directed by your caregiver.  Use an electric razor to remove hair.  Wash your hands after handling magazines, letters, and newspapers. SEEK IMMEDIATE MEDICAL CARE IF:   You have a fever.  You have chills or start to shake.  You feel nauseous or vomit.  You develop mouth sores.  You develop aches and pains.  You have redness and swelling around open wounds.  Your skin is warm to the touch.  You have pus coming from your wounds.  You develop swollen lymph nodes.  You feel weak or fatigued.  You develop red streaks on the skin. MAKE SURE YOU:  Understand these instructions.  Will watch your condition.  Will get help right away if you are not doing well or get worse.   This information is not intended to replace advice given to you by your health care provider. Make sure you discuss any questions you have with your health care provider.   Document Released: 07/16/2001 Document Revised: 04/18/2011 Document Reviewed: 08/06/2014 Elsevier Interactive Patient Education [employer].

## 2015-03-30 ENCOUNTER — Ambulatory Visit (HOSPITAL_COMMUNITY)
Admission: RE | Admit: 2015-03-30 | Discharge: 2015-03-30 | Disposition: A | Discharge status: Home / Self Care | Payer: Medicare Other | Source: Ambulatory Visit | Attending: Internal Medicine | Admitting: Internal Medicine

## 2015-03-30 ENCOUNTER — Encounter (HOSPITAL_COMMUNITY): Payer: Self-pay

## 2015-03-30 DIAGNOSIS — I714 Abdominal aortic aneurysm, without rupture: Secondary | ICD-10-CM | POA: Diagnosis not present

## 2015-03-30 DIAGNOSIS — M5136 Other intervertebral disc degeneration, lumbar region: Secondary | ICD-10-CM | POA: Diagnosis not present

## 2015-03-30 DIAGNOSIS — C3492 Malignant neoplasm of unspecified part of left bronchus or lung: Secondary | ICD-10-CM | POA: Diagnosis not present

## 2015-03-30 DIAGNOSIS — Z5111 Encounter for antineoplastic chemotherapy: Secondary | ICD-10-CM

## 2015-03-30 DIAGNOSIS — I2510 Atherosclerotic heart disease of native coronary artery without angina pectoris: Secondary | ICD-10-CM | POA: Insufficient documentation

## 2015-03-30 DIAGNOSIS — M4316 Spondylolisthesis, lumbar region: Secondary | ICD-10-CM | POA: Insufficient documentation

## 2015-03-30 DIAGNOSIS — M47816 Spondylosis without myelopathy or radiculopathy, lumbar region: Secondary | ICD-10-CM | POA: Insufficient documentation

## 2015-03-30 DIAGNOSIS — R918 Other nonspecific abnormal finding of lung field: Secondary | ICD-10-CM | POA: Insufficient documentation

## 2015-03-30 DIAGNOSIS — T451X5A Adverse effect of antineoplastic and immunosuppressive drugs, initial encounter: Secondary | ICD-10-CM

## 2015-03-30 DIAGNOSIS — D701 Agranulocytosis secondary to cancer chemotherapy: Secondary | ICD-10-CM

## 2015-03-30 DIAGNOSIS — D179 Benign lipomatous neoplasm, unspecified: Secondary | ICD-10-CM | POA: Insufficient documentation

## 2015-03-30 DIAGNOSIS — K5730 Diverticulosis of large intestine without perforation or abscess without bleeding: Secondary | ICD-10-CM | POA: Insufficient documentation

## 2015-03-30 DIAGNOSIS — J439 Emphysema, unspecified: Secondary | ICD-10-CM | POA: Insufficient documentation

## 2015-03-30 MED ORDER — IOHEXOL 300 MG/ML  SOLN
100.0000 mL | Freq: Once | INTRAMUSCULAR | Status: AC | PRN
  Administered 2015-03-30: 100 mL via INTRAVENOUS

## 2015-03-31 ENCOUNTER — Other Ambulatory Visit (HOSPITAL_BASED_OUTPATIENT_CLINIC_OR_DEPARTMENT_OTHER): Payer: Medicare Other

## 2015-03-31 ENCOUNTER — Ambulatory Visit (HOSPITAL_BASED_OUTPATIENT_CLINIC_OR_DEPARTMENT_OTHER): Payer: Medicare Other | Admitting: Internal Medicine

## 2015-03-31 ENCOUNTER — Telehealth: Payer: Self-pay

## 2015-03-31 ENCOUNTER — Encounter: Payer: Self-pay | Admitting: Internal Medicine

## 2015-03-31 VITALS — BP 136/72 | HR 113 | Temp 98.5°F | Resp 17 | Ht 62.0 in | Wt 142.4 lb

## 2015-03-31 DIAGNOSIS — C3412 Malignant neoplasm of upper lobe, left bronchus or lung: Secondary | ICD-10-CM | POA: Diagnosis not present

## 2015-03-31 DIAGNOSIS — C3492 Malignant neoplasm of unspecified part of left bronchus or lung: Secondary | ICD-10-CM

## 2015-03-31 DIAGNOSIS — C7951 Secondary malignant neoplasm of bone: Secondary | ICD-10-CM

## 2015-03-31 DIAGNOSIS — Z5111 Encounter for antineoplastic chemotherapy: Secondary | ICD-10-CM

## 2015-03-31 LAB — CBC WITH DIFFERENTIAL/PLATELET
BASO%: 0.5 % (ref 0.0–2.0)
BASOS ABS: 0 10*3/uL (ref 0.0–0.1)
EOS%: 0.5 % (ref 0.0–7.0)
Eosinophils Absolute: 0 10*3/uL (ref 0.0–0.5)
HEMATOCRIT: 30.8 % — AB (ref 34.8–46.6)
HEMOGLOBIN: 10 g/dL — AB (ref 11.6–15.9)
LYMPH%: 14.4 % (ref 14.0–49.7)
MCH: 29.8 pg (ref 25.1–34.0)
MCHC: 32.7 g/dL (ref 31.5–36.0)
MCV: 91.3 fL (ref 79.5–101.0)
MONO#: 0.8 10*3/uL (ref 0.1–0.9)
MONO%: 11.2 % (ref 0.0–14.0)
NEUT#: 5.4 10*3/uL (ref 1.5–6.5)
NEUT%: 73.4 % (ref 38.4–76.8)
PLATELETS: 250 10*3/uL (ref 145–400)
RBC: 3.37 10*6/uL — ABNORMAL LOW (ref 3.70–5.45)
RDW: 19.2 % — ABNORMAL HIGH (ref 11.2–14.5)
WBC: 7.3 10*3/uL (ref 3.9–10.3)
lymph#: 1.1 10*3/uL (ref 0.9–3.3)

## 2015-03-31 LAB — COMPREHENSIVE METABOLIC PANEL
ALBUMIN: 3.7 g/dL (ref 3.5–5.0)
ALK PHOS: 86 U/L (ref 40–150)
ALT: 14 U/L (ref 0–55)
ANION GAP: 11 meq/L (ref 3–11)
AST: 20 U/L (ref 5–34)
BUN: 21.4 mg/dL (ref 7.0–26.0)
CHLORIDE: 98 meq/L (ref 98–109)
CO2: 30 mEq/L — ABNORMAL HIGH (ref 22–29)
CREATININE: 1 mg/dL (ref 0.6–1.1)
Calcium: 9.8 mg/dL (ref 8.4–10.4)
EGFR: 58 mL/min/{1.73_m2} — ABNORMAL LOW (ref >90)
Glucose: 115 mg/dl (ref 70–140)
Potassium: 4.2 mEq/L (ref 3.5–5.1)
Sodium: 139 mEq/L (ref 136–145)
Total Bilirubin: 0.36 mg/dL (ref 0.20–1.20)
Total Protein: 7.4 g/dL (ref 6.4–8.3)

## 2015-03-31 NOTE — Telephone Encounter (Signed)
I called the daughter and explained to her that there was an enlarging bone lesion in the left sixth rib which probably explains the left sided chest pain. I will refer the patient to radiation oncology for consideration of palliative radiotherapy to this area. I already called Mrs. Netzley with the result and the referral.

## 2015-03-31 NOTE — Progress Notes (Signed)
[facility] [phone]) [phone]   Fax:(336) [address] [place] [address]  DIAGNOSIS: Stage IV (T2, N2, M1a) non-small cell lung cancer, squamous cell carcinoma presented with central left upper lobe lung mass with associated postobstructive atelectasis as well as ipsilateral and subcarinal lymphadenopathy as well as pleural tumor spread diagnosed in October 2015.  PRIOR THERAPY:  1) Systemic chemotherapy with carboplatin for AUC of 5 on day 1 and Abraxane 100 MG/M2 on days 1, 8 and 15 every 3 weeks. Status post 6 cycles.  2) Nivolumab per research protocol BMS 370 group B, arm A, given every 2 weeks. Status post 7 cycles. Last dose was given 08/13/2014 discontinued on 08/28/2014 secondary to disease progression. 3) Systemic chemotherapy with docetaxel 75 MG/M2 and Cyramza 10 MG/KG every 3 weeks. First dose 09/10/2014. Status post 6 cycles. Discontinued secondary to disease progression. 4) Systemic chemotherapy with carboplatin for AUC of 5 on day 1 and gemcitabine 1000 MG/M2 on days 1 and 8 every 3 weeks, status post 3 cycles, discontinued secondary to entrance.   CURRENT THERAPY: Systemic chemotherapy with single agent gemcitabine 800 MG/M2 on days 1 and 8 every 3 weeks. First cycle 04/01/2015.  INTERVAL HISTORY: Tammie Clay 70 y.o. female returns to the clinic today for follow-up visit accompanied by her boyfriend and daughter. The patient is feeling a little bit better today. She continues to have intermittent pain on the left side of the chest. She tolerated the previous cycle of her treatment fairly well except for the increasing fatigue and weakness. She has been off treatment for the last 3 weeks. She denied having any weight loss or night sweats. The patient denied having any chest pain, shortness of breath, cough or hemoptysis. She has no bleeding issues. She has no nausea or vomiting  and no fever or chills. She had repeat CT scan of the chest, abdomen and pelvis performed recently and she is here for evaluation and discussion of her scan results.  MEDICAL HISTORY: Past Medical History  Diagnosis Date  . Hypertension   . COPD (chronic obstructive pulmonary disease) ([facility])   . Hyperlipidemia   . GERD (gastroesophageal reflux disease)   . Osteopenia   . Insomnia   . Mediastinal lymphadenopathy 11/19/13    PER CT  . Multiple lung nodules on CT 11/19/13    LEFT LUNG BASE  . Lung mass 11/19/13    RUL PER CT  . Coronary artery calcification seen on CAT scan 11/21/2013  . COPD (chronic obstructive pulmonary disease) ([facility]) 11/21/2013  . Pneumonia     hx of  . Anxiety   . Encounter for antineoplastic chemotherapy 12/03/2014  . Chemotherapy induced neutropenia ([facility]) 02/11/2015  . stage iv sq cell lung ca dx'd 11/2013    Recist     ALLERGIES:  has No Known Allergies.  MEDICATIONS:  Current Outpatient Prescriptions  Medication Sig Dispense Refill  . acetaminophen (TYLENOL) 325 MG tablet Take 650 mg by mouth daily as needed for moderate pain or headache. Take 2 tablets every morning    . albuterol (PROVENTIL HFA;VENTOLIN HFA) 108 (90 BASE) MCG/ACT inhaler Inhale 2 puffs into the lungs every 6 (six) hours as needed for wheezing.    . budesonide-formoterol (SYMBICORT) 160-4.5 MCG/ACT inhaler Inhale 2 puffs into the lungs 2 (two) times daily.    . calcium carbonate (TUMS - DOSED IN MG ELEMENTAL CALCIUM) 500 MG chewable tablet  Chew 1 tablet by mouth daily as needed for indigestion or heartburn.     . diphenoxylate-atropine (LOMOTIL) 2.5-0.025 MG tablet TAKE 2 TABLETS 4 TIMES A DAY AS NEEDED FOR DIARRHEA 30 tablet 0  . docusate sodium (COLACE) 100 MG capsule Take 100 mg by mouth 2 (two) times daily as needed for moderate constipation.     . DULoxetine (CYMBALTA) 30 MG capsule TAKE ONE CAPSULE BY MOUTH EVERY DAY 30 capsule 2  . Fexofenadine HCl (ALLEGRA ALLERGY PO) Take by mouth  once.    Marland Kitchen HYDROcodone-acetaminophen (NORCO) 10-325 MG per tablet Take 1 tablet by mouth every 6 (six) hours as needed for moderate pain or severe pain.     Marland Kitchen lidocaine-prilocaine (EMLA) cream Apply 1 application topically as needed. 30 g 0  . lisinopril-hydrochlorothiazide (PRINZIDE,ZESTORETIC) 20-12.5 MG per tablet Take 2 tablets by mouth every morning.     . loperamide (IMODIUM) 2 MG capsule Take by mouth as needed for diarrhea or loose stools.    Marland Kitchen LORazepam (ATIVAN) 2 MG tablet Take 2 mg by mouth at bedtime as needed. Take 1-2 tabs by mouth at bedtimes as needed for insomnia  0  . metoprolol succinate (TOPROL-XL) 50 MG 24 hr tablet Take 50 mg by mouth every morning. Take with or immediately following a meal.    . mirtazapine (REMERON) 15 MG tablet Take 15 mg by mouth at bedtime. Reported on 03/11/2015  3  . Multiple Vitamin (MULTIVITAMIN WITH MINERALS) TABS Take 1 tablet by mouth daily.    . polyethylene glycol (MIRALAX / GLYCOLAX) packet Take 17 g by mouth daily as needed for moderate constipation or severe constipation.     . prochlorperazine (COMPAZINE) 10 MG tablet Take 1 tablet (10 mg total) by mouth every 6 (six) hours as needed for nausea or vomiting. 30 tablet 1  . ranitidine (ZANTAC) 150 MG tablet Take 150 mg by mouth 2 (two) times daily as needed for heartburn.     . SYMBICORT 80-4.5 MCG/ACT inhaler 2 Inhalers.  5   No current facility-administered medications for this visit.    SURGICAL HISTORY:  Past Surgical History  Procedure Laterality Date  .  c section x 2    . Urethral cyst removed    . Tonsillectomy    . Appendectomy    . Colonoscopy with propofol N/A 07/17/2012    Procedure: COLONOSCOPY WITH PROPOFOL;  Surgeon: Garlan Fair, MD;  Location: [facility];  Service: Endoscopy;  Laterality: N/A;  . Esophagogastroduodenoscopy (egd) with propofol N/A 07/17/2012    Procedure: ESOPHAGOGASTRODUODENOSCOPY (EGD) WITH PROPOFOL;  Surgeon: Garlan Fair, MD;  Location: [facility];  Service: Endoscopy;  Laterality: N/A;  . Video bronchoscopy with endobronchial ultrasound N/A 12/03/2013    Procedure: VIDEO BRONCHOSCOPY WITH ENDOBRONCHIAL ULTRASOUND WITH BIOPSIES;  Surgeon: Grace Isaac, MD;  Location: [facility];  Service: Thoracic;  Laterality: N/A;  . Portacath placement Left 12/10/2013    Procedure: INSERTION PORT-A-CATH;  Surgeon: Grace Isaac, MD;  Location: [facility];  Service: Thoracic;  Laterality: Left;    REVIEW OF SYSTEMS:  Constitutional: positive for fatigue Eyes: negative Ears, nose, mouth, throat, and face: negative Respiratory: positive for dyspnea on exertion and pleurisy/chest pain Cardiovascular: negative Gastrointestinal: negative Genitourinary:negative Integument/breast: negative Hematologic/lymphatic: negative Musculoskeletal:negative Neurological: negative Behavioral/Psych: negative Endocrine: negative Allergic/Immunologic: negative   PHYSICAL EXAMINATION: General appearance: alert, cooperative, fatigued and no distress Head: Normocephalic, without obvious abnormality, atraumatic Neck: no adenopathy, no JVD, supple, symmetrical, trachea midline and thyroid not enlarged,  symmetric, no tenderness/mass/nodules Lymph nodes: Cervical, supraclavicular, and axillary nodes normal. Resp: clear to auscultation bilaterally Back: symmetric, no curvature. ROM normal. No CVA tenderness. Cardio: regular rate and rhythm, S1, S2 normal, no murmur, click, rub or gallop GI: soft, non-tender; bowel sounds normal; no masses,  no organomegaly Genitalia: defer exam Extremities: extremities normal, atraumatic, no cyanosis or edema Neurologic: Alert and oriented X 3, normal strength and tone. Normal symmetric reflexes. Normal coordination and gait  ECOG PERFORMANCE STATUS: 1 - Symptomatic but completely ambulatory  There were no vitals taken for this visit.  LABORATORY DATA: Lab Results  Component Value Date   WBC 7.3 03/31/2015   HGB 10.0*  03/31/2015   HCT 30.8* 03/31/2015   MCV 91.3 03/31/2015   PLT 250 03/31/2015      Chemistry      Component Value Date/Time   NA 137 03/18/2015 1209   NA 135 07/19/2014 1607   K 4.1 03/18/2015 1209   K 3.4* 07/19/2014 1607   CL 99* 07/19/2014 1607   CO2 26 03/18/2015 1209   CO2 28 07/19/2014 1607   BUN 26.3* 03/18/2015 1209   BUN 21* 07/19/2014 1607   CREATININE 1.2* 03/18/2015 1209   CREATININE 1.01* 07/19/2014 1607   CREATININE 0.80 11/22/2013 1054      Component Value Date/Time   CALCIUM 9.4 03/18/2015 1209   CALCIUM 8.7* 07/19/2014 1607   ALKPHOS 92 03/18/2015 1209   ALKPHOS 77 12/10/2013 0637   AST 30 03/18/2015 1209   AST 16 12/10/2013 0637   ALT 19 03/18/2015 1209   ALT 16 12/10/2013 0637   BILITOT 0.31 03/18/2015 1209   BILITOT 0.4 12/10/2013 0637       RADIOGRAPHIC STUDIES: Ct Chest W Contrast  03/30/2015  CLINICAL DATA:  Lung cancer diagnosed in October 2015. Chemotherapy in progress. Left anterior chest discomfort for 1 month. Shortness of breath. EXAM: CT CHEST, ABDOMEN, AND PELVIS WITH CONTRAST TECHNIQUE: Multidetector CT imaging of the chest, abdomen and pelvis was performed following the standard protocol during bolus administration of intravenous contrast. CONTRAST:  130m OMNIPAQUE IOHEXOL 300 MG/ML  SOLN COMPARISON:  Multiple exams, including 01/13/2015 FINDINGS: CT CHEST FINDINGS Mediastinum/Nodes: Right lower paratracheal node 1.8 cm in short axis on image 22 series 2, stable. AP window lymph node 1.4 cm in short axis on image 21 of series 2, previously 1.1 cm by my measurement. Subcarinal lymph node 1.9 cm in short axis on image 26 series 2, formerly 2.1 cm. Right hilar node 1.3 cm in short axis on image 26 series 2, formerly 1.1 cm. Paraesophageal node 1.4 cm in short axis on image 31 series 2, previously 1.3 cm. Soft tissue density along the left pericardial margin 6.0 by 3.9 cm, formerly 6.6 by 4.3 cm by my measurement, with some nodularity extending to  the pleural surface along the lingula, and possibly with some degree of atelectasis. Coronary, aortic arch, and branch vessel atherosclerotic vascular disease. Lungs/Pleura: Trace left pleural effusion, reduced in size from previous, nonspecific for transudative versus exudative etiology. Emphysema. New 0.4 by 0.5 cm nodule in the right upper lobe on image 11 series 4. Additional new nodules are present such as the 4 mm nodule in the posterior basal segment right lower lobe. Some of the nodules in the right lung are stable and some are larger than previous. One example of a larger nodule is the 6 by 5 mm right lower lobe nodule on image 41 of series 4 which previously measured 5 by 4  mm. There is stable scarring in the right middle lobe. Generally stable nodules in the left lung. Musculoskeletal: Generally increase in size of the lytic lesion in the left sixth rib laterally, for example comparing image 30 of series 4 to image 26 of series 4, with the lesion measuring 3 cm along the length of the rib is post prior measurement of 1.8 cm, and potentially with increased soft tissue component extending into the adjacent fifth intercostal space. There is some new early left fifth rib erosion due to the intercostal portion of this mass which the was not previously readily apparent. Small foci of sclerosis in the sternal body appear stable. Small sclerotic lesion in the neck of the right scapula, stable. CT ABDOMEN PELVIS FINDINGS Hepatobiliary: There are multiple hypodense lesions in the liver. These are for the most part stable, aside from a new 0.5 by 0.3 cm lesion in segment 7 on image 48 series 2. Hypodense lesion in segment 4 of the liver on image 53 series 2 measures 7 mm in diameter, previously 5 mm but at times in the past this has measured up to 7 mm. Pancreas: Unremarkable Spleen: Unremarkable Adrenals/Urinary Tract: Non rotated right kidney several tiny hypodense renal lesions are technically nonspecific  although statistically likely to be cysts in the unchanged. Stomach/Bowel: Mild wall thickening in the stomach antrum. Prominent stool throughout the colon favors constipation. Mild sigmoid diverticulosis. Vascular/Lymphatic: Aortoiliac atherosclerotic vascular disease. Infrarenal abdominal aortic aneurysm, 3.1 by 2.8 cm, with mural thrombus observed. Reproductive: Suspected anterior uterine fibroid given the deviation of the thin endometrium posteriorly. 4.5 by 3.6 cm cystic lesion of the left ovary, fluid density. Other: No supplemental non-categorized findings. Musculoskeletal: Chronic left iliopsoas lipoma. Lumbar spondylosis and degenerative disc disease with mild grade 1 retrolisthesis at L1-2. IMPRESSION: 1. Mild progression of disease with some new and mildly enlarging pulmonary nodules, enlarging lytic lesion of the left sixth rib with some extension into the fifth intercostal space and new early erosion of the left fifth rib, and generally stable pathologic thoracic adenopathy. 2. New 5 mm hypodense lesion in posteriorly in segment 7 along the dome of the liver merits observation 3. Small infrarenal abdominal aortic aneurysm. Recommend followup by ultrasound in 3 years. This recommendation follows ACR consensus guidelines: White Paper of the ACR Incidental Findings Committee II on Vascular Findings. J Am Coll Radiol 2013; 66:063-016 4. Other imaging findings of potential clinical significance: Coronary, aortic arch, and branch vessel atherosclerotic vascular disease. Aortoiliac atherosclerotic vascular disease. Emphysema. Mild wall thickening in the stomach antrum, gastritis not excluded. Prominent stool throughout the colon favors constipation. Mild sigmoid diverticulosis. Suspected anterior uterine fibroid. Electronically Signed   By: Van Clines M.D.   On: 03/30/2015 08:16   Ct Abdomen Pelvis W Contrast  03/30/2015  CLINICAL DATA:  Lung cancer diagnosed in October 2015. Chemotherapy in  progress. Left anterior chest discomfort for 1 month. Shortness of breath. EXAM: CT CHEST, ABDOMEN, AND PELVIS WITH CONTRAST TECHNIQUE: Multidetector CT imaging of the chest, abdomen and pelvis was performed following the standard protocol during bolus administration of intravenous contrast. CONTRAST:  110m OMNIPAQUE IOHEXOL 300 MG/ML  SOLN COMPARISON:  Multiple exams, including 01/13/2015 FINDINGS: CT CHEST FINDINGS Mediastinum/Nodes: Right lower paratracheal node 1.8 cm in short axis on image 22 series 2, stable. AP window lymph node 1.4 cm in short axis on image 21 of series 2, previously 1.1 cm by my measurement. Subcarinal lymph node 1.9 cm in short axis on image 26 series 2, formerly  2.1 cm. Right hilar node 1.3 cm in short axis on image 26 series 2, formerly 1.1 cm. Paraesophageal node 1.4 cm in short axis on image 31 series 2, previously 1.3 cm. Soft tissue density along the left pericardial margin 6.0 by 3.9 cm, formerly 6.6 by 4.3 cm by my measurement, with some nodularity extending to the pleural surface along the lingula, and possibly with some degree of atelectasis. Coronary, aortic arch, and branch vessel atherosclerotic vascular disease. Lungs/Pleura: Trace left pleural effusion, reduced in size from previous, nonspecific for transudative versus exudative etiology. Emphysema. New 0.4 by 0.5 cm nodule in the right upper lobe on image 11 series 4. Additional new nodules are present such as the 4 mm nodule in the posterior basal segment right lower lobe. Some of the nodules in the right lung are stable and some are larger than previous. One example of a larger nodule is the 6 by 5 mm right lower lobe nodule on image 41 of series 4 which previously measured 5 by 4 mm. There is stable scarring in the right middle lobe. Generally stable nodules in the left lung. Musculoskeletal: Generally increase in size of the lytic lesion in the left sixth rib laterally, for example comparing image 30 of series 4 to  image 26 of series 4, with the lesion measuring 3 cm along the length of the rib is post prior measurement of 1.8 cm, and potentially with increased soft tissue component extending into the adjacent fifth intercostal space. There is some new early left fifth rib erosion due to the intercostal portion of this mass which the was not previously readily apparent. Small foci of sclerosis in the sternal body appear stable. Small sclerotic lesion in the neck of the right scapula, stable. CT ABDOMEN PELVIS FINDINGS Hepatobiliary: There are multiple hypodense lesions in the liver. These are for the most part stable, aside from a new 0.5 by 0.3 cm lesion in segment 7 on image 48 series 2. Hypodense lesion in segment 4 of the liver on image 53 series 2 measures 7 mm in diameter, previously 5 mm but at times in the past this has measured up to 7 mm. Pancreas: Unremarkable Spleen: Unremarkable Adrenals/Urinary Tract: Non rotated right kidney several tiny hypodense renal lesions are technically nonspecific although statistically likely to be cysts in the unchanged. Stomach/Bowel: Mild wall thickening in the stomach antrum. Prominent stool throughout the colon favors constipation. Mild sigmoid diverticulosis. Vascular/Lymphatic: Aortoiliac atherosclerotic vascular disease. Infrarenal abdominal aortic aneurysm, 3.1 by 2.8 cm, with mural thrombus observed. Reproductive: Suspected anterior uterine fibroid given the deviation of the thin endometrium posteriorly. 4.5 by 3.6 cm cystic lesion of the left ovary, fluid density. Other: No supplemental non-categorized findings. Musculoskeletal: Chronic left iliopsoas lipoma. Lumbar spondylosis and degenerative disc disease with mild grade 1 retrolisthesis at L1-2. IMPRESSION: 1. Mild progression of disease with some new and mildly enlarging pulmonary nodules, enlarging lytic lesion of the left sixth rib with some extension into the fifth intercostal space and new early erosion of the left  fifth rib, and generally stable pathologic thoracic adenopathy. 2. New 5 mm hypodense lesion in posteriorly in segment 7 along the dome of the liver merits observation 3. Small infrarenal abdominal aortic aneurysm. Recommend followup by ultrasound in 3 years. This recommendation follows ACR consensus guidelines: White Paper of the ACR Incidental Findings Committee II on Vascular Findings. J Am Coll Radiol 2013; 42:683-419 4. Other imaging findings of potential clinical significance: Coronary, aortic arch, and branch vessel atherosclerotic vascular  disease. Aortoiliac atherosclerotic vascular disease. Emphysema. Mild wall thickening in the stomach antrum, gastritis not excluded. Prominent stool throughout the colon favors constipation. Mild sigmoid diverticulosis. Suspected anterior uterine fibroid. Electronically Signed   By: Van Clines M.D.   On: 03/30/2015 08:16    ASSESSMENT AND PLAN: This is a very pleasant 70 years old white female recently diagnosed with:  1) Stage IV non-small cell lung cancer, squamous cell carcinoma completed systemic chemotherapy with carboplatin and Abraxane status post 6 cycles.  She is currently on treatment with immunotherapy according to the BMS checkmate 370 clinical trial with Nivolumab.she status post 7 cycles and tolerating her last treatment fairly well with no significant diarrhea. Her treatment was also discontinued secondary to disease progression. The patient was started on systemic chemotherapy with docetaxel and surrounds is status post 6 cycles and tolerating her treatment well except for fatigue and pain from the Neulasta injection. This treatment was discontinued secondary to disease progression. The patient is currently undergoing systemic chemotherapy with carboplatin for AUC of 5 on day 1 and gemcitabine 1000 MG/M2 on days 1 and 8 every 3 weeks is status post 3 cycles. She tolerated the last cycle of her treatment well except for persistent fatigue  fatigue. The recent CT scan of the chest, abdomen and pelvis showed mixed response with mild progression of disease with some new and mildly enlarged pulmonary nodules and enlarging lytic lesion in the left sixth rib and the new 5 mm hypodense lesion in the liver. I discussed the scan results with the patient and her family and showed them the images. I gave her several options for treatment of her condition including continuation treatment with single agent gemcitabine versus switching treatment to a single agent Navelbine. She was tested for PDL 1 and she had no PDL 1 expression. She is interested in continuing treatment with single agent gemcitabine for now. For the lytic lesion on the sixth rib, I will refer the patient to radiation oncology for consideration of palliative radiotherapy to this lesion. She would come back for follow-up visit in one month for reevaluation before starting cycle #2. The patient was advised to call immediately if she has any concerning symptoms in the interval.  The patient voices understanding of current disease status and treatment options and is in agreement with the current care plan.  All questions were answered. The patient knows to call the clinic with any problems, questions or concerns. We can certainly see the patient much sooner if necessary.  Disclaimer: This note was dictated with voice recognition software. Similar sounding words can inadvertently be transcribed and may not be corrected upon review.

## 2015-03-31 NOTE — Telephone Encounter (Signed)
Patient's daughter, Garald Braver called stating that she was with her mother when she saw Dr. [name] today when she received her scan results.  She said Dr. [name] called patient this afternoon and gave her mother different news regarding her scans.  Her daughter is upset and is requesting that Dr. [name] call her directlyMarcie Bal [place] [phone].

## 2015-04-01 ENCOUNTER — Ambulatory Visit (HOSPITAL_BASED_OUTPATIENT_CLINIC_OR_DEPARTMENT_OTHER): Payer: Medicare Other

## 2015-04-01 ENCOUNTER — Encounter: Payer: Self-pay | Admitting: Medical Oncology

## 2015-04-01 VITALS — BP 138/74 | HR 97 | Temp 97.9°F | Resp 18

## 2015-04-01 DIAGNOSIS — Z5111 Encounter for antineoplastic chemotherapy: Secondary | ICD-10-CM | POA: Diagnosis not present

## 2015-04-01 DIAGNOSIS — C7951 Secondary malignant neoplasm of bone: Secondary | ICD-10-CM

## 2015-04-01 DIAGNOSIS — C3412 Malignant neoplasm of upper lobe, left bronchus or lung: Secondary | ICD-10-CM

## 2015-04-01 DIAGNOSIS — C3492 Malignant neoplasm of unspecified part of left bronchus or lung: Secondary | ICD-10-CM

## 2015-04-01 MED ORDER — HEPARIN SOD (PORK) LOCK FLUSH 100 UNIT/ML IV SOLN
500.0000 [IU] | Freq: Once | INTRAVENOUS | Status: AC | PRN
  Administered 2015-04-01: 500 [IU]
  Filled 2015-04-01: qty 5

## 2015-04-01 MED ORDER — SODIUM CHLORIDE 0.9 % IJ SOLN
10.0000 mL | INTRAMUSCULAR | Status: DC | PRN
  Administered 2015-04-01: 10 mL
  Filled 2015-04-01: qty 10

## 2015-04-01 MED ORDER — SODIUM CHLORIDE 0.9 % IV SOLN
800.0000 mg/m2 | Freq: Once | INTRAVENOUS | Status: AC
  Administered 2015-04-01: 1368 mg via INTRAVENOUS
  Filled 2015-04-01: qty 35.98

## 2015-04-01 MED ORDER — DEXAMETHASONE SODIUM PHOSPHATE 100 MG/10ML IJ SOLN
Freq: Once | INTRAMUSCULAR | Status: AC
  Administered 2015-04-01: 11:00:00 via INTRAVENOUS
  Filled 2015-04-01: qty 8

## 2015-04-01 MED ORDER — SODIUM CHLORIDE 0.9 % IV SOLN
Freq: Once | INTRAVENOUS | Status: AC
  Administered 2015-04-01: 11:00:00 via INTRAVENOUS

## 2015-04-01 NOTE — Progress Notes (Signed)
BMS 370: Group B, Arm A: Patient in follow-up I met with patient today during her treatment appointment in infusion room. I informed patient of new information with study and a new consent form for patient signature. I informed patient that re-consent is voluntary and this new consent form is to keep her informed of any changes within study. Patient and I went over consent form, page by page. I read over the changes/new information to patient and answered all patient's questions to her satisfaction. Patient then proceeded to initial and date each page and sign the last two pages. Patient was provided with a copy of the ICF Group B, version 2 for her records. I thanked patient for her time and continued support of study. Patient was encouraged to contact Dr. [name] or myself with any questions or concerns she may have.  Patient did report a concern of "ache" to her upper left chest area. States intensity "sometimes a 7" on 10 pt pain scale. Reports ache is constant, denies that it's worse when takes a deep breath, denies that it is related to port, due to the fact that as she reports to have it daily. I informed patient that I will give message to Dr. [name], as well as, Desk Nurse, Diane for follow up with her.  [name], RN, BSN Clinical Research 04/01/2015 11:41 AM

## 2015-04-01 NOTE — Patient Instructions (Signed)
[facility] Discharge Instructions for Patients Receiving Chemotherapy  Today you received the following chemotherapy agent, Gemzar.  To help prevent nausea and vomiting after your treatment, we encourage you to take your nausea medication. If you develop nausea and vomiting that is not controlled by your nausea medication, call the clinic.   BELOW ARE SYMPTOMS THAT SHOULD BE REPORTED IMMEDIATELY:  *FEVER GREATER THAN 100.5 F  *CHILLS WITH OR WITHOUT FEVER  NAUSEA AND VOMITING THAT IS NOT CONTROLLED WITH YOUR NAUSEA MEDICATION  *UNUSUAL SHORTNESS OF BREATH  *UNUSUAL BRUISING OR BLEEDING  TENDERNESS IN MOUTH AND THROAT WITH OR WITHOUT PRESENCE OF ULCERS  *URINARY PROBLEMS  *BOWEL PROBLEMS  UNUSUAL RASH Items with * indicate a potential emergency and should be followed up as soon as possible.  Feel free to call the clinic you have any questions or concerns. The clinic phone number is [phone].  Please show the [facility] at check-in to the Emergency Department and triage nurse.

## 2015-04-01 NOTE — Progress Notes (Signed)
Histology and Location of Primary Cancer: Stage IV (T2, N2, M1a) non-small cell lung cancer, squamous cell carcinoma presented with central left upper lobe lung mass  Location(s) of Symptomatic Metastases: CT of chest from 03/30/15 showed "enlarging lytic lesion of the left sixth rib with some extension into the fifth intercostal space and new early erosion of the left fifth rib."  Past/Anticipated chemotherapy by medical oncology, if any: 1. Systemic chemotherapy with carboplatin for AUC of 5 on day 1 and Abraxane 100 MG/M2 on days 1, 8 and 15 every 3 weeks. Status post 6 cycles.  2) Nivolumab per research protocol BMS 370 group B, arm A, given every 2 weeks. Status post 7 cycles. Last dose was given 08/13/2014 discontinued on 08/28/2014 secondary to disease progression. 3) Systemic chemotherapy with docetaxel 75 MG/M2 and Cyramza 10 MG/KG every 3 weeks. First dose 09/10/2014. Status post 6 cycles. Discontinued secondary to disease progression. 4) Systemic chemotherapy with carboplatin for AUC of 5 on day 1 and gemcitabine 1000 MG/M2 on days 1 and 8 every 3 weeks, status post 3 cycles, discontinued. CURRENT THERAPY: Systemic chemotherapy with single agent gemcitabine 800 MG/M2 on days 1 and 8 every 3 weeks. First cycle 04/01/2015.  Pain on a scale of 0-10 is: reports pain in her left upper chest that started by her port a cath and radiates around.  She reports it is usually 4/10 during the day and is 9/10 in the evenings.  She takes tylenol once a day and vicoden 2 tablets at bedtime.  SAFETY ISSUES:  Prior radiation? no  Pacemaker/ICD? no  Possible current pregnancy? no  Is the patient on methotrexate? no  Current Complaints / other details:  Patient is here with her significant other.  BP 125/74 mmHg  Pulse 92  Temp(Src) 98.5 F (36.9 C) (Oral)  Resp 16  Wt 145 lb 3.2 oz (65.862 kg)  SpO2 98%   Wt Readings from Last 3 Encounters:  04/02/15 145 lb 3.2 oz (65.862 kg)  03/31/15 142  lb 6.4 oz (64.592 kg)  03/11/15 144 lb (65.318 kg)

## 2015-04-02 ENCOUNTER — Telehealth: Payer: Self-pay | Admitting: Medical Oncology

## 2015-04-02 ENCOUNTER — Telehealth: Payer: Self-pay | Admitting: Internal Medicine

## 2015-04-02 ENCOUNTER — Ambulatory Visit
Admission: RE | Admit: 2015-04-02 | Discharge: 2015-04-02 | Disposition: A | Discharge status: Home / Self Care | Payer: Medicare Other | Source: Ambulatory Visit | Attending: Radiation Oncology | Admitting: Radiation Oncology

## 2015-04-02 ENCOUNTER — Encounter: Payer: Self-pay | Admitting: Radiation Oncology

## 2015-04-02 VITALS — BP 125/74 | HR 92 | Temp 98.5°F | Resp 16 | Wt 145.2 lb

## 2015-04-02 DIAGNOSIS — C7951 Secondary malignant neoplasm of bone: Secondary | ICD-10-CM

## 2015-04-02 DIAGNOSIS — R590 Localized enlarged lymph nodes: Secondary | ICD-10-CM | POA: Insufficient documentation

## 2015-04-02 DIAGNOSIS — F419 Anxiety disorder, unspecified: Secondary | ICD-10-CM | POA: Insufficient documentation

## 2015-04-02 DIAGNOSIS — Z510 Encounter for antineoplastic radiation therapy: Secondary | ICD-10-CM | POA: Insufficient documentation

## 2015-04-02 DIAGNOSIS — C3492 Malignant neoplasm of unspecified part of left bronchus or lung: Secondary | ICD-10-CM

## 2015-04-02 DIAGNOSIS — Z87891 Personal history of nicotine dependence: Secondary | ICD-10-CM | POA: Insufficient documentation

## 2015-04-02 DIAGNOSIS — J449 Chronic obstructive pulmonary disease, unspecified: Secondary | ICD-10-CM | POA: Insufficient documentation

## 2015-04-02 DIAGNOSIS — M8580 Other specified disorders of bone density and structure, unspecified site: Secondary | ICD-10-CM | POA: Insufficient documentation

## 2015-04-02 DIAGNOSIS — E785 Hyperlipidemia, unspecified: Secondary | ICD-10-CM | POA: Insufficient documentation

## 2015-04-02 DIAGNOSIS — I2510 Atherosclerotic heart disease of native coronary artery without angina pectoris: Secondary | ICD-10-CM | POA: Insufficient documentation

## 2015-04-02 DIAGNOSIS — D701 Agranulocytosis secondary to cancer chemotherapy: Secondary | ICD-10-CM | POA: Insufficient documentation

## 2015-04-02 DIAGNOSIS — F1010 Alcohol abuse, uncomplicated: Secondary | ICD-10-CM | POA: Insufficient documentation

## 2015-04-02 DIAGNOSIS — I10 Essential (primary) hypertension: Secondary | ICD-10-CM | POA: Insufficient documentation

## 2015-04-02 DIAGNOSIS — K219 Gastro-esophageal reflux disease without esophagitis: Secondary | ICD-10-CM | POA: Insufficient documentation

## 2015-04-02 NOTE — Progress Notes (Signed)
Radiation Oncology         ([phone]) [phone] ________________________________  Initial Outpatient Consultation  Name: Tammie Clay MRN: [id number]  Date: 04/02/2015  DOB: Apr 17, 1945  JM:EQAS,[email or web address], MD  Mayra Neer, MD   REFERRING PHYSICIAN: Mayra Neer, MD  DIAGNOSIS: Stage IV (T2, N2, M1a) non-small cell lung cancer  HISTORY OF PRESENT ILLNESS::Tammie Clay is a 70 y.o. female who was seen seen out courtesy of Dr. [name] for an opinion concerning radiation therapy as part of management of patient's progressive non-small cell lung cancer.   Prior Therapies Encompassed.Marland KitchenMarland Kitchen  1) Systemic chemotherapy with carboplatin for AUC of 5 on day 1 and Abraxane 100 MG/M2 on days 1, 8 and 15 every 3 weeks. Status post 6 cycles.  2) Nivolumab per research protocol BMS 370 group B, arm A, given every 2 weeks. Status post 7 cycles. Last dose was given 08/13/2014 discontinued on 08/28/2014 secondary to disease progression. 3) Systemic chemotherapy with docetaxel 75 MG/M2 and Cyramza 10 MG/KG every 3 weeks. First dose 09/10/2014. Status post 6 cycles. Discontinued secondary to disease progression. 4) Systemic chemotherapy with carboplatin for AUC of 5 on day 1 and gemcitabine 1000 MG/M2 on days 1 and 8 every 3 weeks, status post 3 cycles, discontinued secondary to entrance.  INTERVAL HISTORY: She had repeat CT scan of the chest/abd/pelvis on 03/30/2015 showing mild progression of disease. She saw Dr. [name] on 03/31/15 who started systemic chemotherapy with single agent gemcitabine 800 MG/M2 on days 1 and 8 every 3 weeks with her first cycle on 04/01/2015. The patient was referred to me for my consideration of radiotherapy for the management of her disease with symptomatic left rib pain from osseous metastasis. The patient presents to the clinic with her significant other.  PREVIOUS RADIATION THERAPY: No  PAST MEDICAL HISTORY:  has a past medical history of Hypertension; COPD (chronic  obstructive pulmonary disease) ([place]); Hyperlipidemia; GERD (gastroesophageal reflux disease); Osteopenia; Insomnia; Mediastinal lymphadenopathy (11/19/13); Multiple lung nodules on CT (11/19/13); Lung mass (11/19/13); Coronary artery calcification seen on CAT scan (11/21/2013); COPD (chronic obstructive pulmonary disease) ([place]) (11/21/2013); Pneumonia; Anxiety; Encounter for antineoplastic chemotherapy (12/03/2014); Chemotherapy induced neutropenia ([facility]) (02/11/2015); and stage iv sq cell lung ca (dx'd 11/2013).    PAST SURGICAL HISTORY: Past Surgical History  Procedure Laterality Date  .  c section x 2    . Urethral cyst removed    . Tonsillectomy    . Appendectomy    . Colonoscopy with propofol N/A 07/17/2012    Procedure: COLONOSCOPY WITH PROPOFOL;  Surgeon: Garlan Fair, MD;  Location: [facility];  Service: Endoscopy;  Laterality: N/A;  . Esophagogastroduodenoscopy (egd) with propofol N/A 07/17/2012    Procedure: ESOPHAGOGASTRODUODENOSCOPY (EGD) WITH PROPOFOL;  Surgeon: Garlan Fair, MD;  Location: [facility];  Service: Endoscopy;  Laterality: N/A;  . Video bronchoscopy with endobronchial ultrasound N/A 12/03/2013    Procedure: VIDEO BRONCHOSCOPY WITH ENDOBRONCHIAL ULTRASOUND WITH BIOPSIES;  Surgeon: Grace Isaac, MD;  Location: [facility];  Service: Thoracic;  Laterality: N/A;  . Portacath placement Left 12/10/2013    Procedure: INSERTION PORT-A-CATH;  Surgeon: Grace Isaac, MD;  Location: [facility];  Service: Thoracic;  Laterality: Left;    FAMILY HISTORY: family history includes CVA in her mother; Cancer in her cousin; Diabetes in her father; Heart disease in her cousin and father.  SOCIAL HISTORY:  reports that she quit smoking about 2 years ago. Her smoking use included Cigarettes and E-cigarettes. She has a 25 pack-year smoking history. She  has never used smokeless tobacco. She reports that she drinks alcohol. She reports that she does not use illicit drugs.  ALLERGIES:  Review of patient's allergies indicates no known allergies.  MEDICATIONS:  Current Outpatient Prescriptions  Medication Sig Dispense Refill  . acetaminophen (TYLENOL) 325 MG tablet Take 650 mg by mouth daily as needed for moderate pain or headache. Take 2 tablets every morning    . albuterol (PROVENTIL HFA;VENTOLIN HFA) 108 (90 BASE) MCG/ACT inhaler Inhale 2 puffs into the lungs every 6 (six) hours as needed for wheezing.    . calcium carbonate (TUMS - DOSED IN MG ELEMENTAL CALCIUM) 500 MG chewable tablet Chew 1 tablet by mouth daily as needed for indigestion or heartburn.     . diphenoxylate-atropine (LOMOTIL) 2.5-0.025 MG tablet TAKE 2 TABLETS 4 TIMES A DAY AS NEEDED FOR DIARRHEA 30 tablet 0  . docusate sodium (COLACE) 100 MG capsule Take 100 mg by mouth 2 (two) times daily as needed for moderate constipation.     . DULoxetine (CYMBALTA) 30 MG capsule TAKE ONE CAPSULE BY MOUTH EVERY DAY 30 capsule 2  . Fexofenadine HCl (ALLEGRA ALLERGY PO) Take by mouth once.    Marland Kitchen HYDROcodone-acetaminophen (NORCO) 10-325 MG per tablet Take 1 tablet by mouth every 6 (six) hours as needed for moderate pain or severe pain.     Marland Kitchen lidocaine-prilocaine (EMLA) cream Apply 1 application topically as needed. 30 g 0  . lisinopril-hydrochlorothiazide (PRINZIDE,ZESTORETIC) 20-12.5 MG per tablet Take 2 tablets by mouth every morning.     . loperamide (IMODIUM) 2 MG capsule Take by mouth as needed for diarrhea or loose stools.    Marland Kitchen LORazepam (ATIVAN) 2 MG tablet Take 2 mg by mouth at bedtime as needed. Take 1-2 tabs by mouth at bedtimes as needed for insomnia  0  . metoprolol succinate (TOPROL-XL) 50 MG 24 hr tablet Take 50 mg by mouth every morning. Take with or immediately following a meal.    . Multiple Vitamin (MULTIVITAMIN WITH MINERALS) TABS Take 1 tablet by mouth daily.    . polyethylene glycol (MIRALAX / GLYCOLAX) packet Take 17 g by mouth daily as needed for moderate constipation or severe constipation.     .  prochlorperazine (COMPAZINE) 10 MG tablet Take 1 tablet (10 mg total) by mouth every 6 (six) hours as needed for nausea or vomiting. 30 tablet 1  . ranitidine (ZANTAC) 150 MG tablet Take 150 mg by mouth 2 (two) times daily as needed for heartburn.     . SYMBICORT 160-4.5 MCG/ACT inhaler INHALE 2 PUFFS INTO THE LUNGS TWICE DAILY  10   No current facility-administered medications for this encounter.    REVIEW OF SYSTEMS:  A 15 point review of systems is documented in the electronic medical record. This was obtained by the nursing staff. However, I reviewed this with the patient to discuss relevant findings and make appropriate changes.  Pertinent items are noted in HPI.   She reports double vision (that is not new), watery eyes (she will see an opthamologist for this next month), and left sided rib pain. She takes hydrocodone at night for this pain. She denies hemoptysis.   PHYSICAL EXAM:  weight is 145 lb 3.2 oz (65.862 kg). Her oral temperature is 98.5 F (36.9 C). Her blood pressure is 125/74 and her pulse is 92. Her respiration is 16 and oxygen saturation is 98%.   General: Alert and oriented, in no acute distress HEENT: Head is normocephalic. Extraocular movements are intact. Oropharynx is  clear. Upper and lower dental partials. Neck: Neck is supple, no palpable cervical or supraclavicular lymphadenopathy. Heart: Regular in rate and rhythm with no murmurs, rubs, or gallops. Chest: Clear to auscultation bilaterally, with no rhonchi, wheezes, or rales. No point tenderness pain along the left lateral rib cage. Abdomen: Soft, nontender, nondistended, with no rigidity or guarding. Extremities: No cyanosis or edema. Lymphatics: see Neck Exam Skin: No concerning lesions. Musculoskeletal: symmetric strength and muscle tone throughout. Neurologic: Cranial nerves II through XII are grossly intact. No obvious focalities. Speech is fluent. Coordination is intact. Numbness in her fingers and  toes. Psychiatric: Judgment and insight are intact. Affect is appropriate.  ECOG = 1  LABORATORY DATA:  Lab Results  Component Value Date   WBC 7.3 03/31/2015   HGB 10.0* 03/31/2015   HCT 30.8* 03/31/2015   MCV 91.3 03/31/2015   PLT 250 03/31/2015   NEUTROABS 5.4 03/31/2015   Lab Results  Component Value Date   NA 139 03/31/2015   K 4.2 03/31/2015   CL 99* 07/19/2014   CO2 30* 03/31/2015   GLUCOSE 115 03/31/2015   CREATININE 1.0 03/31/2015   CALCIUM 9.8 03/31/2015      RADIOGRAPHY: Ct Chest W Contrast  03/30/2015  CLINICAL DATA:  Lung cancer diagnosed in October 2015. Chemotherapy in progress. Left anterior chest discomfort for 1 month. Shortness of breath. EXAM: CT CHEST, ABDOMEN, AND PELVIS WITH CONTRAST TECHNIQUE: Multidetector CT imaging of the chest, abdomen and pelvis was performed following the standard protocol during bolus administration of intravenous contrast. CONTRAST:  143m OMNIPAQUE IOHEXOL 300 MG/ML  SOLN COMPARISON:  Multiple exams, including 01/13/2015 FINDINGS: CT CHEST FINDINGS Mediastinum/Nodes: Right lower paratracheal node 1.8 cm in short axis on image 22 series 2, stable. AP window lymph node 1.4 cm in short axis on image 21 of series 2, previously 1.1 cm by my measurement. Subcarinal lymph node 1.9 cm in short axis on image 26 series 2, formerly 2.1 cm. Right hilar node 1.3 cm in short axis on image 26 series 2, formerly 1.1 cm. Paraesophageal node 1.4 cm in short axis on image 31 series 2, previously 1.3 cm. Soft tissue density along the left pericardial margin 6.0 by 3.9 cm, formerly 6.6 by 4.3 cm by my measurement, with some nodularity extending to the pleural surface along the lingula, and possibly with some degree of atelectasis. Coronary, aortic arch, and branch vessel atherosclerotic vascular disease. Lungs/Pleura: Trace left pleural effusion, reduced in size from previous, nonspecific for transudative versus exudative etiology. Emphysema. New 0.4 by 0.5 cm  nodule in the right upper lobe on image 11 series 4. Additional new nodules are present such as the 4 mm nodule in the posterior basal segment right lower lobe. Some of the nodules in the right lung are stable and some are larger than previous. One example of a larger nodule is the 6 by 5 mm right lower lobe nodule on image 41 of series 4 which previously measured 5 by 4 mm. There is stable scarring in the right middle lobe. Generally stable nodules in the left lung. Musculoskeletal: Generally increase in size of the lytic lesion in the left sixth rib laterally, for example comparing image 30 of series 4 to image 26 of series 4, with the lesion measuring 3 cm along the length of the rib is post prior measurement of 1.8 cm, and potentially with increased soft tissue component extending into the adjacent fifth intercostal space. There is some new early left fifth rib erosion due  to the intercostal portion of this mass which the was not previously readily apparent. Small foci of sclerosis in the sternal body appear stable. Small sclerotic lesion in the neck of the right scapula, stable. CT ABDOMEN PELVIS FINDINGS Hepatobiliary: There are multiple hypodense lesions in the liver. These are for the most part stable, aside from a new 0.5 by 0.3 cm lesion in segment 7 on image 48 series 2. Hypodense lesion in segment 4 of the liver on image 53 series 2 measures 7 mm in diameter, previously 5 mm but at times in the past this has measured up to 7 mm. Pancreas: Unremarkable Spleen: Unremarkable Adrenals/Urinary Tract: Non rotated right kidney several tiny hypodense renal lesions are technically nonspecific although statistically likely to be cysts in the unchanged. Stomach/Bowel: Mild wall thickening in the stomach antrum. Prominent stool throughout the colon favors constipation. Mild sigmoid diverticulosis. Vascular/Lymphatic: Aortoiliac atherosclerotic vascular disease. Infrarenal abdominal aortic aneurysm, 3.1 by 2.8 cm,  with mural thrombus observed. Reproductive: Suspected anterior uterine fibroid given the deviation of the thin endometrium posteriorly. 4.5 by 3.6 cm cystic lesion of the left ovary, fluid density. Other: No supplemental non-categorized findings. Musculoskeletal: Chronic left iliopsoas lipoma. Lumbar spondylosis and degenerative disc disease with mild grade 1 retrolisthesis at L1-2. IMPRESSION: 1. Mild progression of disease with some new and mildly enlarging pulmonary nodules, enlarging lytic lesion of the left sixth rib with some extension into the fifth intercostal space and new early erosion of the left fifth rib, and generally stable pathologic thoracic adenopathy. 2. New 5 mm hypodense lesion in posteriorly in segment 7 along the dome of the liver merits observation 3. Small infrarenal abdominal aortic aneurysm. Recommend followup by ultrasound in 3 years. This recommendation follows ACR consensus guidelines: White Paper of the ACR Incidental Findings Committee II on Vascular Findings. J Am Coll Radiol 2013; 78:242-353 4. Other imaging findings of potential clinical significance: Coronary, aortic arch, and branch vessel atherosclerotic vascular disease. Aortoiliac atherosclerotic vascular disease. Emphysema. Mild wall thickening in the stomach antrum, gastritis not excluded. Prominent stool throughout the colon favors constipation. Mild sigmoid diverticulosis. Suspected anterior uterine fibroid. Electronically Signed   By: Van Clines M.D.   On: 03/30/2015 08:16   Ct Abdomen Pelvis W Contrast  03/30/2015  CLINICAL DATA:  Lung cancer diagnosed in October 2015. Chemotherapy in progress. Left anterior chest discomfort for 1 month. Shortness of breath. EXAM: CT CHEST, ABDOMEN, AND PELVIS WITH CONTRAST TECHNIQUE: Multidetector CT imaging of the chest, abdomen and pelvis was performed following the standard protocol during bolus administration of intravenous contrast. CONTRAST:  13m OMNIPAQUE IOHEXOL 300  MG/ML  SOLN COMPARISON:  Multiple exams, including 01/13/2015 FINDINGS: CT CHEST FINDINGS Mediastinum/Nodes: Right lower paratracheal node 1.8 cm in short axis on image 22 series 2, stable. AP window lymph node 1.4 cm in short axis on image 21 of series 2, previously 1.1 cm by my measurement. Subcarinal lymph node 1.9 cm in short axis on image 26 series 2, formerly 2.1 cm. Right hilar node 1.3 cm in short axis on image 26 series 2, formerly 1.1 cm. Paraesophageal node 1.4 cm in short axis on image 31 series 2, previously 1.3 cm. Soft tissue density along the left pericardial margin 6.0 by 3.9 cm, formerly 6.6 by 4.3 cm by my measurement, with some nodularity extending to the pleural surface along the lingula, and possibly with some degree of atelectasis. Coronary, aortic arch, and branch vessel atherosclerotic vascular disease. Lungs/Pleura: Trace left pleural effusion, reduced in size from  previous, nonspecific for transudative versus exudative etiology. Emphysema. New 0.4 by 0.5 cm nodule in the right upper lobe on image 11 series 4. Additional new nodules are present such as the 4 mm nodule in the posterior basal segment right lower lobe. Some of the nodules in the right lung are stable and some are larger than previous. One example of a larger nodule is the 6 by 5 mm right lower lobe nodule on image 41 of series 4 which previously measured 5 by 4 mm. There is stable scarring in the right middle lobe. Generally stable nodules in the left lung. Musculoskeletal: Generally increase in size of the lytic lesion in the left sixth rib laterally, for example comparing image 30 of series 4 to image 26 of series 4, with the lesion measuring 3 cm along the length of the rib is post prior measurement of 1.8 cm, and potentially with increased soft tissue component extending into the adjacent fifth intercostal space. There is some new early left fifth rib erosion due to the intercostal portion of this mass which the was not  previously readily apparent. Small foci of sclerosis in the sternal body appear stable. Small sclerotic lesion in the neck of the right scapula, stable. CT ABDOMEN PELVIS FINDINGS Hepatobiliary: There are multiple hypodense lesions in the liver. These are for the most part stable, aside from a new 0.5 by 0.3 cm lesion in segment 7 on image 48 series 2. Hypodense lesion in segment 4 of the liver on image 53 series 2 measures 7 mm in diameter, previously 5 mm but at times in the past this has measured up to 7 mm. Pancreas: Unremarkable Spleen: Unremarkable Adrenals/Urinary Tract: Non rotated right kidney several tiny hypodense renal lesions are technically nonspecific although statistically likely to be cysts in the unchanged. Stomach/Bowel: Mild wall thickening in the stomach antrum. Prominent stool throughout the colon favors constipation. Mild sigmoid diverticulosis. Vascular/Lymphatic: Aortoiliac atherosclerotic vascular disease. Infrarenal abdominal aortic aneurysm, 3.1 by 2.8 cm, with mural thrombus observed. Reproductive: Suspected anterior uterine fibroid given the deviation of the thin endometrium posteriorly. 4.5 by 3.6 cm cystic lesion of the left ovary, fluid density. Other: No supplemental non-categorized findings. Musculoskeletal: Chronic left iliopsoas lipoma. Lumbar spondylosis and degenerative disc disease with mild grade 1 retrolisthesis at L1-2. IMPRESSION: 1. Mild progression of disease with some new and mildly enlarging pulmonary nodules, enlarging lytic lesion of the left sixth rib with some extension into the fifth intercostal space and new early erosion of the left fifth rib, and generally stable pathologic thoracic adenopathy. 2. New 5 mm hypodense lesion in posteriorly in segment 7 along the dome of the liver merits observation 3. Small infrarenal abdominal aortic aneurysm. Recommend followup by ultrasound in 3 years. This recommendation follows ACR consensus guidelines: White Paper of the  ACR Incidental Findings Committee II on Vascular Findings. J Am Coll Radiol 2013; 22:979-892 4. Other imaging findings of potential clinical significance: Coronary, aortic arch, and branch vessel atherosclerotic vascular disease. Aortoiliac atherosclerotic vascular disease. Emphysema. Mild wall thickening in the stomach antrum, gastritis not excluded. Prominent stool throughout the colon favors constipation. Mild sigmoid diverticulosis. Suspected anterior uterine fibroid. Electronically Signed   By: Van Clines M.D.   On: 03/30/2015 08:16      IMPRESSION: Stage IV (T2, N2, M1a) non-small cell lung cancer.  The patient is symptomatic with her left 6th rib metastasis and would be a good candidate for a short course of palliative radiation therapy directed to this area.  PLAN: I spoke to the patient today regarding her diagnosis and options for treatment. We discussed that this treatment would be palliative in nature. We discussed the process of CT simulation and the placement tattoos. We discussed 2 weeks of treatment as an outpatient. We discussed the possibility of asymptomatic lung damage. We discussed the low likelihood of secondary malignancies. We discussed the possible side effects including but not limited to skin redness and fatigue. CT simulation is scheduled next Tuesday at [facility]. The patient signed a consent form and this was placed in her medical chart.   ------------------------------------------------  Blair Promise, PhD, MD  This document serves as a record of services personally performed by Gery Pray, MD. It was created on his behalf by Darcus Austin, a trained medical scribe. The creation of this record is based on the scribe's personal observations and the provider's statements to them. This document has been checked and approved by the attending provider.

## 2015-04-02 NOTE — Progress Notes (Signed)
Please see the Nurse Progress Note in the MD Initial Consult Encounter for this patient. 

## 2015-04-02 NOTE — Telephone Encounter (Signed)
I left message and instructed pt to notify Dr Sondra Come today about her pain in upper Left chest area.

## 2015-04-02 NOTE — Telephone Encounter (Signed)
S/w pt, advised lab appt added for 3/1 @ 1pm and other appts made. Pt will pick up appts for March on 3/1.

## 2015-04-04 ENCOUNTER — Other Ambulatory Visit: Payer: Self-pay

## 2015-04-04 ENCOUNTER — Emergency Department (HOSPITAL_COMMUNITY): Payer: Medicare Other

## 2015-04-04 ENCOUNTER — Inpatient Hospital Stay (HOSPITAL_COMMUNITY)
Admission: EM | Admit: 2015-04-04 | Discharge: 2015-04-09 | DRG: 871 | Disposition: A | Discharge status: Home / Self Care | Payer: Medicare Other | Attending: Internal Medicine | Admitting: Internal Medicine

## 2015-04-04 ENCOUNTER — Encounter (HOSPITAL_COMMUNITY): Payer: Self-pay | Admitting: Emergency Medicine

## 2015-04-04 DIAGNOSIS — I2510 Atherosclerotic heart disease of native coronary artery without angina pectoris: Secondary | ICD-10-CM | POA: Diagnosis present

## 2015-04-04 DIAGNOSIS — E785 Hyperlipidemia, unspecified: Secondary | ICD-10-CM | POA: Diagnosis present

## 2015-04-04 DIAGNOSIS — Z823 Family history of stroke: Secondary | ICD-10-CM

## 2015-04-04 DIAGNOSIS — T451X5A Adverse effect of antineoplastic and immunosuppressive drugs, initial encounter: Secondary | ICD-10-CM | POA: Diagnosis present

## 2015-04-04 DIAGNOSIS — R509 Fever, unspecified: Secondary | ICD-10-CM

## 2015-04-04 DIAGNOSIS — K219 Gastro-esophageal reflux disease without esophagitis: Secondary | ICD-10-CM | POA: Diagnosis present

## 2015-04-04 DIAGNOSIS — J189 Pneumonia, unspecified organism: Secondary | ICD-10-CM | POA: Diagnosis present

## 2015-04-04 DIAGNOSIS — R0602 Shortness of breath: Secondary | ICD-10-CM | POA: Diagnosis not present

## 2015-04-04 DIAGNOSIS — E440 Moderate protein-calorie malnutrition: Secondary | ICD-10-CM | POA: Diagnosis present

## 2015-04-04 DIAGNOSIS — Z79899 Other long term (current) drug therapy: Secondary | ICD-10-CM

## 2015-04-04 DIAGNOSIS — M8580 Other specified disorders of bone density and structure, unspecified site: Secondary | ICD-10-CM | POA: Diagnosis present

## 2015-04-04 DIAGNOSIS — Z87891 Personal history of nicotine dependence: Secondary | ICD-10-CM

## 2015-04-04 DIAGNOSIS — J9621 Acute and chronic respiratory failure with hypoxia: Secondary | ICD-10-CM | POA: Diagnosis not present

## 2015-04-04 DIAGNOSIS — C3492 Malignant neoplasm of unspecified part of left bronchus or lung: Secondary | ICD-10-CM | POA: Diagnosis present

## 2015-04-04 DIAGNOSIS — Z833 Family history of diabetes mellitus: Secondary | ICD-10-CM

## 2015-04-04 DIAGNOSIS — G620 Drug-induced polyneuropathy: Secondary | ICD-10-CM | POA: Diagnosis present

## 2015-04-04 DIAGNOSIS — A419 Sepsis, unspecified organism: Secondary | ICD-10-CM | POA: Diagnosis not present

## 2015-04-04 DIAGNOSIS — Z8249 Family history of ischemic heart disease and other diseases of the circulatory system: Secondary | ICD-10-CM

## 2015-04-04 DIAGNOSIS — J441 Chronic obstructive pulmonary disease with (acute) exacerbation: Secondary | ICD-10-CM | POA: Diagnosis not present

## 2015-04-04 DIAGNOSIS — I10 Essential (primary) hypertension: Secondary | ICD-10-CM | POA: Diagnosis present

## 2015-04-04 DIAGNOSIS — F419 Anxiety disorder, unspecified: Secondary | ICD-10-CM | POA: Diagnosis present

## 2015-04-04 DIAGNOSIS — R0682 Tachypnea, not elsewhere classified: Secondary | ICD-10-CM

## 2015-04-04 DIAGNOSIS — Z7951 Long term (current) use of inhaled steroids: Secondary | ICD-10-CM

## 2015-04-04 DIAGNOSIS — D6481 Anemia due to antineoplastic chemotherapy: Secondary | ICD-10-CM | POA: Diagnosis present

## 2015-04-04 LAB — CBC WITH DIFFERENTIAL/PLATELET
BASOS ABS: 0 10*3/uL (ref 0.0–0.1)
BASOS PCT: 0 %
EOS PCT: 0 %
Eosinophils Absolute: 0 10*3/uL (ref 0.0–0.7)
HCT: 27.4 % — ABNORMAL LOW (ref 36.0–46.0)
Hemoglobin: 9 g/dL — ABNORMAL LOW (ref 12.0–15.0)
Lymphocytes Relative: 3 %
Lymphs Abs: 0.4 10*3/uL — ABNORMAL LOW (ref 0.7–4.0)
MCH: 30.5 pg (ref 26.0–34.0)
MCHC: 32.8 g/dL (ref 30.0–36.0)
MCV: 92.9 fL (ref 78.0–100.0)
MONO ABS: 0.1 10*3/uL (ref 0.1–1.0)
Monocytes Relative: 1 %
Neutro Abs: 11.7 10*3/uL — ABNORMAL HIGH (ref 1.7–7.7)
Neutrophils Relative %: 96 %
PLATELETS: 243 10*3/uL (ref 150–400)
RBC: 2.95 MIL/uL — ABNORMAL LOW (ref 3.87–5.11)
RDW: 18.6 % — AB (ref 11.5–15.5)
WBC: 12.2 10*3/uL — ABNORMAL HIGH (ref 4.0–10.5)

## 2015-04-04 LAB — COMPREHENSIVE METABOLIC PANEL
ALT: 15 U/L (ref 14–54)
AST: 22 U/L (ref 15–41)
Albumin: 4 g/dL (ref 3.5–5.0)
Alkaline Phosphatase: 74 U/L (ref 38–126)
Anion gap: 11 (ref 5–15)
BILIRUBIN TOTAL: 0.9 mg/dL (ref 0.3–1.2)
BUN: 24 mg/dL — AB (ref 6–20)
CALCIUM: 8.8 mg/dL — AB (ref 8.9–10.3)
CO2: 28 mmol/L (ref 22–32)
CREATININE: 1 mg/dL (ref 0.44–1.00)
Chloride: 97 mmol/L — ABNORMAL LOW (ref 101–111)
GFR calc Af Amer: >60 mL/min (ref >60)
GFR, EST NON AFRICAN AMERICAN: 55 mL/min — AB (ref >60)
Glucose, Bld: 115 mg/dL — ABNORMAL HIGH (ref 65–99)
Potassium: 3.7 mmol/L (ref 3.5–5.1)
Sodium: 136 mmol/L (ref 135–145)
TOTAL PROTEIN: 7 g/dL (ref 6.5–8.1)

## 2015-04-04 LAB — I-STAT CG4 LACTIC ACID, ED
LACTIC ACID, VENOUS: 1.19 mmol/L (ref 0.5–2.0)
Lactic Acid, Venous: 1.15 mmol/L (ref 0.5–2.0)

## 2015-04-04 MED ORDER — SODIUM CHLORIDE 0.9 % IV BOLUS (SEPSIS)
1000.0000 mL | Freq: Once | INTRAVENOUS | Status: AC
Start: 2015-04-04 — End: 2015-04-05
  Administered 2015-04-04: 1000 mL via INTRAVENOUS

## 2015-04-04 MED ORDER — IBUPROFEN 200 MG PO TABS
400.0000 mg | ORAL_TABLET | Freq: Once | ORAL | Status: AC
  Administered 2015-04-04: 400 mg via ORAL
  Filled 2015-04-04: qty 2

## 2015-04-04 MED ORDER — IPRATROPIUM-ALBUTEROL 0.5-2.5 (3) MG/3ML IN SOLN
3.0000 mL | Freq: Once | RESPIRATORY_TRACT | Status: AC
  Administered 2015-04-04: 3 mL via RESPIRATORY_TRACT
  Filled 2015-04-04: qty 3

## 2015-04-04 NOTE — H&P (Signed)
[facility] History and Physical  Tammie Clay ZDG:[id number] DOB: Dec 31, 1945 DOA: 04/04/2015  Referring physician: ED physician PCP: Mayra Neer, MD  Specialists:   Chief Complaint: Fever, shortness of breath  HPI: Tammie Clay is a 70 y.o. 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  Patient reports that she started having fever last night with temperature 101. She has mild shortness of breath and wheezing, but no cough. No chest pain or tenderness over calf areas. Denies symptoms of UTI. Patient does not have nausea, vomiting, abdominal pain, unilateral weakness.  In ED, patient was found to have WBC 12.2, lactate 1.19-->1.15, temperature 101, tachycardia, tachypnea, electrolytes and renal function okay, chest x-ray showed mass, but no infiltration. Patient is admitted to inpatient for further interventional treatment.  EKG: Independently reviewed. QTC 432, early R-wave progression, tachycardia  Where does patient live?   At home    Can patient participate in ADLs?   None   Review of Systems:   General: has fevers, chills, no changes in body weight, has fatigue HEENT: no blurry vision, hearing changes or sore throat Pulm: has dyspnea, no coughing, has wheezing CV: no chest pain, no palpitations Abd: no nausea, vomiting, abdominal pain, diarrhea, constipation GU: no dysuria, burning on urination, increased urinary frequency, hematuria  Ext: no leg edema Neuro: no unilateral weakness, numbness, or tingling, no vision change or hearing loss Skin: no rash MSK: No muscle spasm, no deformity, no limitation of range of movement in spin Heme: No easy bruising.  Travel history: No recent long distant travel.  Allergy: No Known Allergies  Past Medical History  Diagnosis Date  . Hypertension   . COPD  (chronic obstructive pulmonary disease) ([place])   . Hyperlipidemia   . GERD (gastroesophageal reflux disease)   . Osteopenia   . Insomnia   . Mediastinal lymphadenopathy 11/19/13    PER CT  . Multiple lung nodules on CT 11/19/13    LEFT LUNG BASE  . Lung mass 11/19/13    RUL PER CT  . Coronary artery calcification seen on CAT scan 11/21/2013  . COPD (chronic obstructive pulmonary disease) ([place]) 11/21/2013  . Pneumonia     hx of  . Anxiety   . Encounter for antineoplastic chemotherapy 12/03/2014  . Chemotherapy induced neutropenia ([place]) 02/11/2015  . stage iv sq cell lung ca dx'd 11/2013    Recist     Past Surgical History  Procedure Laterality Date  .  c section x 2    . Urethral cyst removed    . Tonsillectomy    . Appendectomy    . Colonoscopy with propofol N/A 07/17/2012    Procedure: COLONOSCOPY WITH PROPOFOL;  Surgeon: Garlan Fair, MD;  Location: [facility];  Service: Endoscopy;  Laterality: N/A;  . Esophagogastroduodenoscopy (egd) with propofol N/A 07/17/2012    Procedure: ESOPHAGOGASTRODUODENOSCOPY (EGD) WITH PROPOFOL;  Surgeon: Garlan Fair, MD;  Location: [facility];  Service: Endoscopy;  Laterality: N/A;  . Video bronchoscopy with endobronchial ultrasound N/A 12/03/2013    Procedure: VIDEO BRONCHOSCOPY WITH ENDOBRONCHIAL ULTRASOUND WITH BIOPSIES;  Surgeon: Grace Isaac, MD;  Location: [facility];  Service: Thoracic;  Laterality: N/A;  . Portacath placement Left 12/10/2013    Procedure: INSERTION PORT-A-CATH;  Surgeon: Grace Isaac, MD;  Location: [facility];  Service: Thoracic;  Laterality: Left;    Social History:  reports that she  quit smoking about 2 years ago. Her smoking use included Cigarettes and E-cigarettes. She has a 25 pack-year smoking history. She has never used smokeless tobacco. She reports that she drinks alcohol. She reports that she does not use illicit drugs.  Family History:  Family History  Problem Relation Age of Onset  . Diabetes  Father   . Heart disease Father     MI  . CVA Mother   . Cancer Cousin     LIPOSARCOMA  . Heart disease Cousin     HEART TRANSPLANTS X 2     Prior to Admission medications   Medication Sig Start Date End Date Taking? Authorizing Provider  acetaminophen (TYLENOL) 325 MG tablet Take 650 mg by mouth daily as needed for moderate pain or headache. Take 2 tablets every morning   Yes Historical Provider, MD  albuterol (PROVENTIL HFA;VENTOLIN HFA) 108 (90 BASE) MCG/ACT inhaler Inhale 2 puffs into the lungs every 6 (six) hours as needed for wheezing.   Yes Historical Provider, MD  calcium carbonate (TUMS - DOSED IN MG ELEMENTAL CALCIUM) 500 MG chewable tablet Chew 1 tablet by mouth daily as needed for indigestion or heartburn.    Yes Historical Provider, MD  diphenoxylate-atropine (LOMOTIL) 2.5-0.025 MG tablet TAKE 2 TABLETS 4 TIMES A DAY AS NEEDED FOR DIARRHEA 12/29/14  Yes Curt Bears, MD  docusate sodium (COLACE) 100 MG capsule Take 100 mg by mouth 2 (two) times daily as needed for moderate constipation.    Yes Historical Provider, MD  DULoxetine (CYMBALTA) 30 MG capsule TAKE ONE CAPSULE BY MOUTH EVERY DAY Patient taking differently: TAKE 30 MG CAPSULE BY MOUTH EVERY DAY 03/08/15  Yes Curt Bears, MD  Fexofenadine HCl (ALLEGRA ALLERGY PO) Take 1 tablet by mouth daily.    Yes Historical Provider, MD  HYDROcodone-acetaminophen (NORCO) 10-325 MG per tablet Take 1 tablet by mouth every 6 (six) hours as needed for moderate pain or severe pain.    Yes Historical Provider, MD  lidocaine-prilocaine (EMLA) cream Apply 1 application topically as needed. 12/05/13  Yes Curt Bears, MD  lisinopril-hydrochlorothiazide (PRINZIDE,ZESTORETIC) 20-12.5 MG per tablet Take 2 tablets by mouth every morning.    Yes Historical Provider, MD  loperamide (IMODIUM) 2 MG capsule Take 2 mg by mouth as needed for diarrhea or loose stools.    Yes Historical Provider, MD  LORazepam (ATIVAN) 2 MG tablet Take 2 mg by  mouth at bedtime as needed. Take 1-2 tabs by mouth at bedtimes as needed for insomnia 02/22/15  Yes Historical Provider, MD  metoprolol succinate (TOPROL-XL) 50 MG 24 hr tablet Take 50 mg by mouth every morning. Take with or immediately following a meal.   Yes Historical Provider, MD  Multiple Vitamin (MULTIVITAMIN WITH MINERALS) TABS Take 1 tablet by mouth daily.   Yes Historical Provider, MD  polyethylene glycol (MIRALAX / GLYCOLAX) packet Take 17 g by mouth daily as needed for moderate constipation or severe constipation.    Yes Historical Provider, MD  prochlorperazine (COMPAZINE) 10 MG tablet Take 1 tablet (10 mg total) by mouth every 6 (six) hours as needed for nausea or vomiting. 04/23/14  Yes Owens Shark, NP  ranitidine (ZANTAC) 150 MG tablet Take 150 mg by mouth 2 (two) times daily as needed for heartburn.    Yes Historical Provider, MD  SYMBICORT 160-4.5 MCG/ACT inhaler INHALE 2 PUFFS INTO THE LUNGS TWICE DAILY 03/14/15  Yes Historical Provider, MD    Physical Exam: Filed Vitals:   04/05/15 0230 04/05/15 0326 04/05/15  0530 04/05/15 0630  BP: 136/73 132/74 106/70 134/83  Pulse: 96 103 92 95  Temp:      TempSrc:      Resp: '24 31 17 21  '$ SpO2: 100% 100% 100% 99%   General: Not in acute distress HEENT:       Eyes: PERRL, EOMI, no scleral icterus.       ENT: No discharge from the ears and nose, no pharynx injection, no tonsillar enlargement.        Neck: No JVD, no bruit, no mass felt. Heme: No neck lymph node enlargement. Cardiac: S1/S2, RRR, Tachycardia, No murmurs, No gallops or rubs. Pulm: Decreased air movement, and bilateral wheezing Abd: Soft, nondistended, nontender, no rebound pain, no organomegaly, BS present. Ext: No pitting leg edema bilaterally. 2+DP/PT pulse bilaterally. Musculoskeletal: No joint deformities, No joint redness or warmth, no limitation of ROM in spin. Skin: No rashes.  Neuro: Alert, oriented X3, cranial nerves II-XII grossly intact, moves all extremities  normally.  Psych: Patient is not psychotic, no suicidal or hemocidal ideation.  Labs on Admission:  Basic Metabolic Panel:  Recent Labs Lab 03/31/15 1251 04/04/15 1953  NA 139 136  K 4.2 3.7  CL  --  97*  CO2 30* 28  GLUCOSE 115 115*  BUN 21.4 24*  CREATININE 1.0 1.00  CALCIUM 9.8 8.8*   Liver Function Tests:  Recent Labs Lab 03/31/15 1251 04/04/15 1953  AST 20 22  ALT 14 15  ALKPHOS 86 74  BILITOT 0.36 0.9  PROT 7.4 7.0  ALBUMIN 3.7 4.0   No results for input(s): LIPASE, AMYLASE in the last 168 hours. No results for input(s): AMMONIA in the last 168 hours. CBC:  Recent Labs Lab 03/31/15 1251 04/04/15 1953  WBC 7.3 12.2*  NEUTROABS 5.4 11.7*  HGB 10.0* 9.0*  HCT 30.8* 27.4*  MCV 91.3 92.9  PLT 250 243   Cardiac Enzymes: No results for input(s): CKTOTAL, CKMB, CKMBINDEX, TROPONINI in the last 168 hours.  BNP (last 3 results) No results for input(s): BNP in the last 8760 hours.  ProBNP (last 3 results) No results for input(s): PROBNP in the last 8760 hours.  CBG: No results for input(s): GLUCAP in the last 168 hours.  Radiological Exams on Admission: Dg Chest 2 View  04/04/2015  CLINICAL DATA:  70 year old current history of left upper lobe lung cancer, presenting with 2 day history of fever and shortness of breath. Patient's most recent chemotherapy treatment was 4 days ago. EXAM: CHEST  2 VIEW COMPARISON:  CT chest 03/30/2015 and earlier. Two-view chest x-ray 10/01/2014. FINDINGS: Cardiac silhouette upper normal in size, unchanged. Thoracic aorta atherosclerotic, unchanged. Medial left upper lobe lung mass as noted on recent CT is partially obscured by the port on the PA image. Chronic elevation left hemidiaphragm with chronic scar/atelectasis in the left lower lobe, unchanged. The multiple small nodules identified on CT are less apparent on chest x-ray. Lungs otherwise clear. No pleural effusions. Degenerative changes throughout the thoracic spine. Left  subclavian Port-A-Cath tip in the upper SVC. IMPRESSION: Medial left upper lobe lung mass as noted on the recent chest CT. Chronic elevation left hemidiaphragm with chronic scar/atelectasis in the left lower lobe. No acute cardiopulmonary disease. Electronically Signed   By: Evangeline Dakin M.D.   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 Patient is septic on admission with tachycardia, tachypnea, fever, leukocytosis. She is hemodynamically stable on admission. Lactate level normal. Patient does not have chest pain or signs of DVT, less likely to have PE.  -will admit patient to telemetry bed  -Nebulizers: scheduled atrovent and prn Xopenex -Solu-Medrol 60 mg IV bid -IV vancomycin and cefepime given patient is immunosuppressed from chemotherapy  -Mucinex for cough  -Urine S. pneumococcal antigen -Follow up blood culture x2, sputum culture, respiratory virus panel, Flu pcr -will get Procalcitonin -IVF: 1L of NS bolus in ED, followed by 125 cc/h   HTN: -Switch Prinzide to lisinopril since patient needed IV fluid -IV hydralazine when necessary  GERD: -Pepcid  Stage IV squamous cell carcinoma of left lung ([place]): Followed up by Dr. [name], on chemotherapy. Last dose was on Wednesday. -Follow-up with Dr. [name]   DVT ppx: SQ Lovenox  Code Status: partial code (ok with CPR, but not intubation) Family Communication:   Yes, patient's family at bed side Disposition Plan: Admit to inpatient   Date of Service 04/05/2015    Ivor Costa [facility] Pager [phone]  If 7PM-7AM, please contact night-coverage [email or web address] Password [facility] 04/05/2015, 7:11 AM

## 2015-04-04 NOTE — ED Provider Notes (Signed)
Medical screening examination/treatment/procedure(s) were conducted as a shared visit with non-physician practitioner(s) and myself.  I personally evaluated the patient during the encounter.  70 yo F here SIS of unknown cause. On my exam, Has dyspnea with minimal wheezing, no e/o pneumonia. Also slight tachycardia but no hypotension. Abdomen benign. Still awaiting urine.  Unsure of cause but with h/o cancer and active chemotherapy, plan to admit.    EKG Interpretation   Date/Time:  Saturday April 04 2015 22:12:54 EST Ventricular Rate:  105 PR Interval:  128 QRS Duration: 95 QT Interval:  327 QTC Calculation: 432 R Axis:   57 Text Interpretation:  Sinus tachycardia Probable left atrial enlargement  Borderline repolarization abnormality ED PHYSICIAN INTERPRETATION  AVAILABLE IN [facility] Confirmed by TEST, Record ([id number]) on  04/05/2015 9:40:07 AM        Merrily Pew, MD 04/05/15 1148

## 2015-04-04 NOTE — ED Notes (Signed)
Pt stated to this RN that she wants to go home 1. "I am uncomfortable" 2. "I cannot use my vaporizer in the hospital" 3. "I just do not want to be here". PA informed of this.

## 2015-04-04 NOTE — ED Provider Notes (Signed)
CSN: [id number]     Arrival date & time 04/04/15  1808 History   First MD Initiated Contact with Patient 04/04/15 2020     Chief Complaint  Patient presents with  . Fever  . Recent chemo   . Shortness of Breath     (Consider location/radiation/quality/duration/timing/severity/associated sxs/prior Treatment) HPI Tammie Clay is a 70 y.o. female with history of hypertension, COPD, metastatic lung cancer, COPD, coronary disease, presents to emergency department complaining of fever. Patient states fever up to 101 since yesterday. She reports taking Tylenol which has helped. She states she did speak with her doctor yesterday who told her to make sure she stays hydrated. The temperature did not improve but today she decided to come to the ER. Patient denies any nasal congestion, sore throat, cough. She does report some nausea and dry heaves today. Denies diarrhea. No urinary symptoms. She states she feels body aches and chills. Last chemotherapy was 3 days ago.  Past Medical History  Diagnosis Date  . Hypertension   . COPD (chronic obstructive pulmonary disease) ([place])   . Hyperlipidemia   . GERD (gastroesophageal reflux disease)   . Osteopenia   . Insomnia   . Mediastinal lymphadenopathy 11/19/13    PER CT  . Multiple lung nodules on CT 11/19/13    LEFT LUNG BASE  . Lung mass 11/19/13    RUL PER CT  . Coronary artery calcification seen on CAT scan 11/21/2013  . COPD (chronic obstructive pulmonary disease) ([place]) 11/21/2013  . Pneumonia     hx of  . Anxiety   . Encounter for antineoplastic chemotherapy 12/03/2014  . Chemotherapy induced neutropenia ([place]) 02/11/2015  . stage iv sq cell lung ca dx'd 11/2013    Recist    Past Surgical History  Procedure Laterality Date  .  c section x 2    . Urethral cyst removed    . Tonsillectomy    . Appendectomy    . Colonoscopy with propofol N/A 07/17/2012    Procedure: COLONOSCOPY WITH PROPOFOL;  Surgeon: Garlan Fair, MD;  Location: [facility]  ENDOSCOPY;  Service: Endoscopy;  Laterality: N/A;  . Esophagogastroduodenoscopy (egd) with propofol N/A 07/17/2012    Procedure: ESOPHAGOGASTRODUODENOSCOPY (EGD) WITH PROPOFOL;  Surgeon: Garlan Fair, MD;  Location: [facility] END[facility];  Service: Endoscopy;  Laterality: N/A;  . Video bronchoscopy with endobronchial ultrasound N/A 12/03/2013    Procedure: VIDEO BRONCHOSCOPY WITH ENDOBRONCHIAL ULTRASOUND WITH BIOPSIES;  Surgeon: Grace Isaac, MD;  Location: [facility];  Service: Thoracic;  Laterality: N/A;  . Portacath placement Left 12/10/2013    Procedure: INSERTION PORT-A-CATH;  Surgeon: Grace Isaac, MD;  Location: [facility];  Service: Thoracic;  Laterality: Left;   Family History  Problem Relation Age of Onset  . Diabetes Father   . Heart disease Father     MI  . CVA Mother   . Cancer Cousin     LIPOSARCOMA  . Heart disease Cousin     HEART TRANSPLANTS X 2   Social History  Substance Use Topics  . Smoking status: Former Smoker -- 0.50 packs/day for 50 years    Types: Cigarettes, E-cigarettes    Quit date: 10/08/2012  . Smokeless tobacco: Never Used     Comment: smokes Vapor daily- smokes about 6-8 ecig (vapor), 15 puffs daily  . Alcohol Use: Yes     Comment: social   OB History    No data available     Review of Systems  Constitutional: Positive for  fever and chills.  Respiratory: Positive for cough and shortness of breath. Negative for chest tightness.   Cardiovascular: Negative for chest pain, palpitations and leg swelling.  Gastrointestinal: Negative for nausea, vomiting, abdominal pain and diarrhea.  Genitourinary: Negative for dysuria, flank pain, vaginal bleeding, vaginal discharge, vaginal pain and pelvic pain.  Musculoskeletal: Negative for myalgias, arthralgias, neck pain and neck stiffness.  Skin: Negative for rash.  Neurological: Positive for weakness. Negative for dizziness and headaches.  All other systems reviewed and are negative.     Allergies  Review of  patient's allergies indicates no known allergies.  Home Medications   Prior to Admission medications   Medication Sig Start Date End Date Taking? Authorizing Provider  acetaminophen (TYLENOL) 325 MG tablet Take 650 mg by mouth daily as needed for moderate pain or headache. Take 2 tablets every morning   Yes Historical Provider, MD  albuterol (PROVENTIL HFA;VENTOLIN HFA) 108 (90 BASE) MCG/ACT inhaler Inhale 2 puffs into the lungs every 6 (six) hours as needed for wheezing.   Yes Historical Provider, MD  calcium carbonate (TUMS - DOSED IN MG ELEMENTAL CALCIUM) 500 MG chewable tablet Chew 1 tablet by mouth daily as needed for indigestion or heartburn.    Yes Historical Provider, MD  diphenoxylate-atropine (LOMOTIL) 2.5-0.025 MG tablet TAKE 2 TABLETS 4 TIMES A DAY AS NEEDED FOR DIARRHEA 12/29/14  Yes Curt Bears, MD  docusate sodium (COLACE) 100 MG capsule Take 100 mg by mouth 2 (two) times daily as needed for moderate constipation.    Yes Historical Provider, MD  DULoxetine (CYMBALTA) 30 MG capsule TAKE ONE CAPSULE BY MOUTH EVERY DAY Patient taking differently: TAKE 30 MG CAPSULE BY MOUTH EVERY DAY 03/08/15  Yes Curt Bears, MD  Fexofenadine HCl (ALLEGRA ALLERGY PO) Take 1 tablet by mouth daily.    Yes Historical Provider, MD  HYDROcodone-acetaminophen (NORCO) 10-325 MG per tablet Take 1 tablet by mouth every 6 (six) hours as needed for moderate pain or severe pain.    Yes Historical Provider, MD  lidocaine-prilocaine (EMLA) cream Apply 1 application topically as needed. 12/05/13  Yes Curt Bears, MD  lisinopril-hydrochlorothiazide (PRINZIDE,ZESTORETIC) 20-12.5 MG per tablet Take 2 tablets by mouth every morning.    Yes Historical Provider, MD  loperamide (IMODIUM) 2 MG capsule Take 2 mg by mouth as needed for diarrhea or loose stools.    Yes Historical Provider, MD  LORazepam (ATIVAN) 2 MG tablet Take 2 mg by mouth at bedtime as needed. Take 1-2 tabs by mouth at bedtimes as needed for  insomnia 02/22/15  Yes Historical Provider, MD  metoprolol succinate (TOPROL-XL) 50 MG 24 hr tablet Take 50 mg by mouth every morning. Take with or immediately following a meal.   Yes Historical Provider, MD  Multiple Vitamin (MULTIVITAMIN WITH MINERALS) TABS Take 1 tablet by mouth daily.   Yes Historical Provider, MD  polyethylene glycol (MIRALAX / GLYCOLAX) packet Take 17 g by mouth daily as needed for moderate constipation or severe constipation.    Yes Historical Provider, MD  prochlorperazine (COMPAZINE) 10 MG tablet Take 1 tablet (10 mg total) by mouth every 6 (six) hours as needed for nausea or vomiting. 04/23/14  Yes Owens Shark, NP  ranitidine (ZANTAC) 150 MG tablet Take 150 mg by mouth 2 (two) times daily as needed for heartburn.    Yes Historical Provider, MD  SYMBICORT 160-4.5 MCG/ACT inhaler INHALE 2 PUFFS INTO THE LUNGS TWICE DAILY 03/14/15  Yes Historical Provider, MD   BP 152/96 mmHg  Pulse  120  Temp(Src) 101 F (38.3 C) (Oral)  Resp 26  SpO2 95% Physical Exam  Constitutional: She is oriented to person, place, and time. She appears well-developed and well-nourished. No distress.  HENT:  Head: Normocephalic.  Right Ear: External ear normal.  Left Ear: External ear normal.  Nose: Nose normal.  Mouth/Throat: Oropharynx is clear and moist.  Eyes: Conjunctivae are normal.  Neck: Normal range of motion. Neck supple.  Cardiovascular: Normal rate, regular rhythm and normal heart sounds.   Pulmonary/Chest: Effort normal and breath sounds normal. No respiratory distress. She has no wheezes. She has no rales.  Tachypnea, decreased air movement bialterally  Abdominal: Soft. Bowel sounds are normal. She exhibits no distension. There is no tenderness. There is no rebound.  Musculoskeletal: She exhibits no edema.  Neurological: She is alert and oriented to person, place, and time.  Skin: Skin is warm and dry.  Psychiatric: She has a normal mood and affect. Her behavior is normal.   Nursing note and vitals reviewed.   ED Course  Procedures (including critical care time) Labs Review Labs Reviewed  COMPREHENSIVE METABOLIC PANEL - Abnormal; Notable for the following:    Chloride 97 (*)    Glucose, Bld 115 (*)    BUN 24 (*)    Calcium 8.8 (*)    GFR calc non Af Amer 55 (*)    All other components within normal limits  CBC WITH DIFFERENTIAL/PLATELET - Abnormal; Notable for the following:    WBC 12.2 (*)    RBC 2.95 (*)    Hemoglobin 9.0 (*)    HCT 27.4 (*)    RDW 18.6 (*)    Neutro Abs 11.7 (*)    Lymphs Abs 0.4 (*)    All other components within normal limits  CULTURE, BLOOD (ROUTINE X 2)  CULTURE, BLOOD (ROUTINE X 2)  URINE CULTURE  URINALYSIS, ROUTINE W REFLEX MICROSCOPIC (NOT AT [facility])  INFLUENZA PANEL BY PCR (TYPE A & B, H1N1)  I-STAT CG4 LACTIC ACID, ED  I-STAT CG4 LACTIC ACID, ED    Imaging Review Dg Chest 2 View  04/04/2015  CLINICAL DATA:  70 year old current history of left upper lobe lung cancer, presenting with 2 day history of fever and shortness of breath. Patient's most recent chemotherapy treatment was 4 days ago. EXAM: CHEST  2 VIEW COMPARISON:  CT chest 03/30/2015 and earlier. Two-view chest x-ray 10/01/2014. FINDINGS: Cardiac silhouette upper normal in size, unchanged. Thoracic aorta atherosclerotic, unchanged. Medial left upper lobe lung mass as noted on recent CT is partially obscured by the port on the PA image. Chronic elevation left hemidiaphragm with chronic scar/atelectasis in the left lower lobe, unchanged. The multiple small nodules identified on CT are less apparent on chest x-ray. Lungs otherwise clear. No pleural effusions. Degenerative changes throughout the thoracic spine. Left subclavian Port-A-Cath tip in the upper SVC. IMPRESSION: Medial left upper lobe lung mass as noted on the recent chest CT. Chronic elevation left hemidiaphragm with chronic scar/atelectasis in the left lower lobe. No acute cardiopulmonary disease.  Electronically Signed   By: Evangeline Dakin M.D.   On: 04/04/2015 18:46   I have personally reviewed and evaluated these images and lab results as part of my medical decision-making.   EKG Interpretation None      MDM   Final diagnoses:  Fever, unspecified fever cause  Tachypnea    patient with lung cancer, last chemotherapy 3 days ago, presents to emergency department complaining of fever since yesterday. Patient states that  she has not had increased shortness of breath or cough, but states she does keep him at baseline. She admits to body aches and chills. Temperature 101 upon presentation to the ER. She was treated with ibuprofen. Labs are pending.  Labs show a leukocytosis of 12.2. Electrolytes with no significant findings. Influenza PCR ordered. Chest x-ray does not show any obvious signs of pneumonia. Discussed with Dr. Dayna Barker, advised to admit for observation for fever, immunocompromise status, tachypnea. Patient is also hypoxic once turned off oxygen, drops to high 80s low 90s.  Patient became a little frustrated, states she is uncomfortable in a stretcher bed. She is also hungry. A broader sandwich. Patient appears more tachypnea at this time but she is also very frustrated. I will give a breathing treatment by her request. IV fluids are running. Patient unable to provide [place] with urine. Does not want in and out catheter.  I spoke with hospitalist, they will admit. Has to start on antibiotics, broad-spectrum, since source of infection is not clear at this time. Urine still pending. Ordered vancomycin and cefepime.  Filed Vitals:   04/04/15 1818 04/04/15 2119 04/04/15 2221 04/04/15 2340  BP: 152/96 137/81 133/85 151/77  Pulse: 120 102 109 109  Temp: 101 F (38.3 C) 98.8 F (37.1 C)    TempSrc: Oral Oral    Resp: 26 34 30 26  SpO2: 95% 100% 93% 92%     Jeannett Senior, PA-C 04/05/15 0018  Merrily Pew, MD 04/05/15 1148

## 2015-04-04 NOTE — ED Notes (Signed)
Pt hx of lung CA, last chemo Wednesday, spike fever last night of 101, was told by Dr to take tylenol and drink a lot of water. Pt states today the fever hasn't subsided, and that she feels more SOB and fatigued now, denies any new pain. HR 120, RR 26, Temp 101 in triage.

## 2015-04-05 DIAGNOSIS — F419 Anxiety disorder, unspecified: Secondary | ICD-10-CM | POA: Diagnosis present

## 2015-04-05 DIAGNOSIS — G620 Drug-induced polyneuropathy: Secondary | ICD-10-CM

## 2015-04-05 DIAGNOSIS — J189 Pneumonia, unspecified organism: Secondary | ICD-10-CM | POA: Diagnosis present

## 2015-04-05 DIAGNOSIS — Z8249 Family history of ischemic heart disease and other diseases of the circulatory system: Secondary | ICD-10-CM | POA: Diagnosis not present

## 2015-04-05 DIAGNOSIS — T451X5A Adverse effect of antineoplastic and immunosuppressive drugs, initial encounter: Secondary | ICD-10-CM | POA: Diagnosis present

## 2015-04-05 DIAGNOSIS — J441 Chronic obstructive pulmonary disease with (acute) exacerbation: Secondary | ICD-10-CM | POA: Diagnosis not present

## 2015-04-05 DIAGNOSIS — Z7951 Long term (current) use of inhaled steroids: Secondary | ICD-10-CM | POA: Diagnosis not present

## 2015-04-05 DIAGNOSIS — C3492 Malignant neoplasm of unspecified part of left bronchus or lung: Secondary | ICD-10-CM | POA: Diagnosis present

## 2015-04-05 DIAGNOSIS — I2510 Atherosclerotic heart disease of native coronary artery without angina pectoris: Secondary | ICD-10-CM | POA: Diagnosis present

## 2015-04-05 DIAGNOSIS — K219 Gastro-esophageal reflux disease without esophagitis: Secondary | ICD-10-CM | POA: Diagnosis present

## 2015-04-05 DIAGNOSIS — A419 Sepsis, unspecified organism: Secondary | ICD-10-CM | POA: Diagnosis present

## 2015-04-05 DIAGNOSIS — D6481 Anemia due to antineoplastic chemotherapy: Secondary | ICD-10-CM | POA: Diagnosis present

## 2015-04-05 DIAGNOSIS — R509 Fever, unspecified: Secondary | ICD-10-CM | POA: Diagnosis not present

## 2015-04-05 DIAGNOSIS — R0602 Shortness of breath: Secondary | ICD-10-CM | POA: Diagnosis present

## 2015-04-05 DIAGNOSIS — E440 Moderate protein-calorie malnutrition: Secondary | ICD-10-CM | POA: Diagnosis present

## 2015-04-05 DIAGNOSIS — Z79899 Other long term (current) drug therapy: Secondary | ICD-10-CM | POA: Diagnosis not present

## 2015-04-05 DIAGNOSIS — J9621 Acute and chronic respiratory failure with hypoxia: Secondary | ICD-10-CM | POA: Diagnosis present

## 2015-04-05 DIAGNOSIS — Z823 Family history of stroke: Secondary | ICD-10-CM | POA: Diagnosis not present

## 2015-04-05 DIAGNOSIS — Z833 Family history of diabetes mellitus: Secondary | ICD-10-CM | POA: Diagnosis not present

## 2015-04-05 DIAGNOSIS — M8580 Other specified disorders of bone density and structure, unspecified site: Secondary | ICD-10-CM | POA: Diagnosis present

## 2015-04-05 DIAGNOSIS — I10 Essential (primary) hypertension: Secondary | ICD-10-CM | POA: Diagnosis not present

## 2015-04-05 DIAGNOSIS — E785 Hyperlipidemia, unspecified: Secondary | ICD-10-CM | POA: Diagnosis present

## 2015-04-05 DIAGNOSIS — Z87891 Personal history of nicotine dependence: Secondary | ICD-10-CM | POA: Diagnosis not present

## 2015-04-05 LAB — URINALYSIS, ROUTINE W REFLEX MICROSCOPIC
Bilirubin Urine: NEGATIVE
Glucose, UA: NEGATIVE mg/dL
Hgb urine dipstick: NEGATIVE
Ketones, ur: NEGATIVE mg/dL
Nitrite: NEGATIVE
PROTEIN: NEGATIVE mg/dL
Specific Gravity, Urine: 1.014 (ref 1.005–1.030)
pH: 5.5 (ref 5.0–8.0)

## 2015-04-05 LAB — APTT: aPTT: 30 seconds (ref 24–37)

## 2015-04-05 LAB — URINE MICROSCOPIC-ADD ON

## 2015-04-05 LAB — INFLUENZA PANEL BY PCR (TYPE A & B)
H1N1FLUPCR: NOT DETECTED
INFLAPCR: NEGATIVE
INFLBPCR: NEGATIVE

## 2015-04-05 LAB — PROTIME-INR
INR: 1.45 (ref 0.00–1.49)
PROTHROMBIN TIME: 17.2 s — AB (ref 11.6–15.2)

## 2015-04-05 LAB — PROCALCITONIN: PROCALCITONIN: 0.2 ng/mL

## 2015-04-05 MED ORDER — HYDRALAZINE HCL 20 MG/ML IJ SOLN
5.0000 mg | INTRAMUSCULAR | Status: DC | PRN
  Administered 2015-04-08: 5 mg via INTRAVENOUS
  Filled 2015-04-05: qty 1

## 2015-04-05 MED ORDER — DULOXETINE HCL 30 MG PO CPEP
30.0000 mg | ORAL_CAPSULE | Freq: Every day | ORAL | Status: DC
  Administered 2015-04-05 – 2015-04-09 (×5): 30 mg via ORAL
  Filled 2015-04-05 (×5): qty 1

## 2015-04-05 MED ORDER — IPRATROPIUM-ALBUTEROL 0.5-2.5 (3) MG/3ML IN SOLN
3.0000 mL | Freq: Four times a day (QID) | RESPIRATORY_TRACT | Status: DC | PRN
  Administered 2015-04-07: 3 mL via RESPIRATORY_TRACT
  Filled 2015-04-05 (×2): qty 3

## 2015-04-05 MED ORDER — LEVALBUTEROL HCL 1.25 MG/0.5ML IN NEBU
1.2500 mg | INHALATION_SOLUTION | Freq: Four times a day (QID) | RESPIRATORY_TRACT | Status: DC
  Administered 2015-04-05: 1.25 mg via RESPIRATORY_TRACT
  Filled 2015-04-05: qty 0.5

## 2015-04-05 MED ORDER — PROCHLORPERAZINE MALEATE 10 MG PO TABS
10.0000 mg | ORAL_TABLET | Freq: Four times a day (QID) | ORAL | Status: DC | PRN
Start: 2015-04-05 — End: 2015-04-09
  Administered 2015-04-05: 10 mg via ORAL
  Filled 2015-04-05 (×2): qty 1

## 2015-04-05 MED ORDER — SODIUM CHLORIDE 0.9 % IV SOLN
500.0000 mg | Freq: Two times a day (BID) | INTRAVENOUS | Status: DC
  Administered 2015-04-05 – 2015-04-08 (×6): 500 mg via INTRAVENOUS
  Filled 2015-04-05 (×7): qty 500

## 2015-04-05 MED ORDER — IPRATROPIUM-ALBUTEROL 0.5-2.5 (3) MG/3ML IN SOLN
3.0000 mL | Freq: Three times a day (TID) | RESPIRATORY_TRACT | Status: DC
  Administered 2015-04-05 – 2015-04-08 (×10): 3 mL via RESPIRATORY_TRACT
  Filled 2015-04-05 (×11): qty 3

## 2015-04-05 MED ORDER — SODIUM CHLORIDE 0.9 % IV SOLN: Freq: Once | INTRAVENOUS | Status: DC

## 2015-04-05 MED ORDER — CALCIUM CARBONATE ANTACID 500 MG PO CHEW
1.0000 | CHEWABLE_TABLET | Freq: Every day | ORAL | Status: DC | PRN
  Filled 2015-04-05: qty 1

## 2015-04-05 MED ORDER — FEXOFENADINE HCL 60 MG PO TABS
60.0000 mg | ORAL_TABLET | Freq: Every day | ORAL | Status: DC
  Administered 2015-04-05 – 2015-04-09 (×5): 60 mg via ORAL
  Filled 2015-04-05 (×5): qty 1

## 2015-04-05 MED ORDER — POLYETHYLENE GLYCOL 3350 17 G PO PACK
17.0000 g | PACK | Freq: Every day | ORAL | Status: DC | PRN
  Filled 2015-04-05: qty 1

## 2015-04-05 MED ORDER — IPRATROPIUM BROMIDE 0.02 % IN SOLN
0.5000 mg | RESPIRATORY_TRACT | Status: DC
  Administered 2015-04-05: 0.5 mg via RESPIRATORY_TRACT
  Filled 2015-04-05: qty 2.5

## 2015-04-05 MED ORDER — FAMOTIDINE 20 MG PO TABS
20.0000 mg | ORAL_TABLET | Freq: Two times a day (BID) | ORAL | Status: DC
  Administered 2015-04-05 – 2015-04-09 (×10): 20 mg via ORAL
  Filled 2015-04-05 (×11): qty 1

## 2015-04-05 MED ORDER — DM-GUAIFENESIN ER 30-600 MG PO TB12
1.0000 | ORAL_TABLET | Freq: Two times a day (BID) | ORAL | Status: DC
  Administered 2015-04-05 – 2015-04-09 (×10): 1 via ORAL
  Filled 2015-04-05 (×12): qty 1

## 2015-04-05 MED ORDER — LIDOCAINE-PRILOCAINE 2.5-2.5 % EX CREA: 1.0000 "application " | TOPICAL_CREAM | CUTANEOUS | Status: DC | PRN

## 2015-04-05 MED ORDER — CEFEPIME HCL 2 G IJ SOLR
2.0000 g | INTRAMUSCULAR | Status: AC
  Administered 2015-04-05: 2 g via INTRAVENOUS
  Filled 2015-04-05: qty 2

## 2015-04-05 MED ORDER — VANCOMYCIN HCL IN DEXTROSE 1-5 GM/200ML-% IV SOLN
1000.0000 mg | Freq: Once | INTRAVENOUS | Status: AC
  Administered 2015-04-05: 1000 mg via INTRAVENOUS
  Filled 2015-04-05: qty 200

## 2015-04-05 MED ORDER — METHYLPREDNISOLONE SODIUM SUCC 125 MG IJ SOLR
60.0000 mg | Freq: Two times a day (BID) | INTRAMUSCULAR | Status: DC
  Administered 2015-04-05 – 2015-04-06 (×4): 60 mg via INTRAVENOUS
  Filled 2015-04-05 (×4): qty 0.96
  Filled 2015-04-05: qty 2

## 2015-04-05 MED ORDER — LOPERAMIDE HCL 2 MG PO CAPS
2.0000 mg | ORAL_CAPSULE | ORAL | Status: DC | PRN
Start: 2015-04-05 — End: 2015-04-09

## 2015-04-05 MED ORDER — HYDROCODONE-ACETAMINOPHEN 10-325 MG PO TABS
1.0000 | ORAL_TABLET | Freq: Four times a day (QID) | ORAL | Status: DC | PRN
  Administered 2015-04-06 – 2015-04-08 (×4): 1 via ORAL
  Filled 2015-04-05 (×5): qty 1

## 2015-04-05 MED ORDER — LORAZEPAM 1 MG PO TABS
2.0000 mg | ORAL_TABLET | Freq: Every evening | ORAL | Status: DC | PRN
Start: 2015-04-05 — End: 2015-04-05

## 2015-04-05 MED ORDER — ACETAMINOPHEN 325 MG PO TABS: 650.0000 mg | ORAL_TABLET | Freq: Once | ORAL | Status: DC

## 2015-04-05 MED ORDER — LISINOPRIL 20 MG PO TABS
20.0000 mg | ORAL_TABLET | Freq: Every day | ORAL | Status: DC
  Administered 2015-04-05 – 2015-04-09 (×5): 20 mg via ORAL
  Filled 2015-04-05 (×5): qty 1

## 2015-04-05 MED ORDER — SODIUM CHLORIDE 0.9 % IV SOLN
INTRAVENOUS | Status: AC
  Administered 2015-04-05: 01:00:00 via INTRAVENOUS

## 2015-04-05 MED ORDER — ENOXAPARIN SODIUM 40 MG/0.4ML ~~LOC~~ SOLN
40.0000 mg | SUBCUTANEOUS | Status: DC
  Administered 2015-04-05 – 2015-04-09 (×5): 40 mg via SUBCUTANEOUS
  Filled 2015-04-05 (×5): qty 0.4

## 2015-04-05 MED ORDER — METOPROLOL SUCCINATE ER 50 MG PO TB24
50.0000 mg | ORAL_TABLET | Freq: Every morning | ORAL | Status: DC
  Administered 2015-04-05 – 2015-04-09 (×5): 50 mg via ORAL
  Filled 2015-04-05 (×5): qty 1

## 2015-04-05 MED ORDER — DOCUSATE SODIUM 100 MG PO CAPS: 100.0000 mg | ORAL_CAPSULE | Freq: Two times a day (BID) | ORAL | Status: DC | PRN

## 2015-04-05 MED ORDER — DIPHENOXYLATE-ATROPINE 2.5-0.025 MG PO TABS: 1.0000 | ORAL_TABLET | Freq: Four times a day (QID) | ORAL | Status: DC | PRN

## 2015-04-05 MED ORDER — LORAZEPAM 1 MG PO TABS
2.0000 mg | ORAL_TABLET | Freq: Every evening | ORAL | Status: DC | PRN
  Administered 2015-04-05 – 2015-04-08 (×4): 2 mg via ORAL
  Filled 2015-04-05 (×4): qty 2

## 2015-04-05 MED ORDER — DEXTROSE 5 % IV SOLN
1.0000 g | INTRAVENOUS | Status: DC
  Administered 2015-04-06 – 2015-04-08 (×3): 1 g via INTRAVENOUS
  Filled 2015-04-05 (×3): qty 1

## 2015-04-05 MED ORDER — ADULT MULTIVITAMIN W/MINERALS CH
1.0000 | ORAL_TABLET | Freq: Every day | ORAL | Status: DC
  Administered 2015-04-05 – 2015-04-09 (×5): 1 via ORAL
  Filled 2015-04-05 (×5): qty 1

## 2015-04-05 NOTE — Clinical Social Work Note (Signed)
CSW received consult for COPD gold.  Per COPD protocol if pt presents to ED 3 times in six months CSW should assess for anxiety and depression.  Per chart review this is patient's first time presenting to ED.  CSW spoke with RN to provide information and per RN pt will be admitted today.  CSW will follow up with MD and pt to assess for needs.  Dede Query, LCSW [place] Worker - Weekend Coverage cell #: [phone]

## 2015-04-05 NOTE — Progress Notes (Signed)
PT Cancellation Note  Patient Details Name: Tammie Clay MRN: [id number] DOB: 1945-07-04   Cancelled Treatment:    Reason Eval/Treat Not Completed: Other (comment) (patient  eating breakfast, reoports that she is being admitted, not [place] today. will check back later.)   Claretha Cooper 04/05/2015, 9:14 AM Tresa Endo PT [phone]

## 2015-04-05 NOTE — ED Notes (Addendum)
Called reported to floor nurse and she reported that environmental services are cleaning room now and will return call when room ready.

## 2015-04-05 NOTE — ED Notes (Addendum)
Awake. Verbally responsive. A/O x4. Resp even and unlabored. No audible adventitious breath sounds noted. ABC's intact. SR on monitor. IV infusing NS at 136m/hr withotu difficulty. Family at bedside. Pt informed of needing sputum specimen and pt reported that she is not coughing anything up at this time.

## 2015-04-05 NOTE — Progress Notes (Addendum)
Pharmacy Antibiotic Note  Tammie Clay is a 70 y.o. female admitted on 04/04/2015 with sepsis.  Pharmacy has been consulted for Cefepime & Vancomycin dosing. Patient with h/o lung cancer, undergoing chemotherapy.  Plan:  Cefepime 2gm IV x 1 followed by 1gm IV q24h  Vancomycin 1gm IV x 1 followed by '500mg'$  IV q12h  F/u cultures & sensitivities     Temp (24hrs), Avg:99.9 F (37.7 C), Min:98.8 F (37.1 C), Max:101 F (38.3 C)   Recent Labs Lab 03/31/15 1251 03/31/15 1251 04/04/15 1953 04/04/15 2003 04/04/15 2255  WBC 7.3  --  12.2*  --   --   CREATININE  --  1.0 1.00  --   --   LATICACIDVEN  --   --   --  1.19 1.15    Estimated Creatinine Clearance: 45.9 mL/min (by C-G formula based on Cr of 1).    No Known Allergies  Antimicrobials this admission: 2/26 Cefepime >>   2/26 Vanc >>  Goal: Vancomycin trough 15-20 mcg/ml  Dose adjustments this admission:    Microbiology results: 2/26 BCx:   2/26 UCx:     Thank you for allowing pharmacy to be a part of this patient's care.  Everette Rank, PharmD 04/05/2015 12:40 AM

## 2015-04-05 NOTE — Progress Notes (Signed)
PT Cancellation Note  Patient Details Name: Tammie Clay MRN: [id number] DOB: 08/09/1945   Cancelled Treatment:    Reason Eval/Treat Not Completed: Patient not medically ready;Fatigue/lethargy limiting ability to participate. Will check back 2/17.   Marcelino Freestone PT [phone]  04/05/2015, 4:02 PM

## 2015-04-05 NOTE — Progress Notes (Signed)
OT Cancellation Note  Patient Details Name: Tammie Clay MRN: [id number] DOB: 06/28/1945   Cancelled Treatment:    Reason Eval/Treat Not Completed: Fatigue/lethargy limiting ability to participate - pt reports she has been ambulating to BR and is currently fatigued - just up to unit a few hours ago.  Will reattempt.   Darlina Rumpf Gonzalez, OTR/L [phone]  04/05/2015, 12:21 PM

## 2015-04-05 NOTE — Progress Notes (Signed)
Patient admitted after midnight, please see H&P.  R/o flu, treat PNA/COPD PT eval in AM Eulogio Bear DO

## 2015-04-05 NOTE — Clinical Social Work Note (Signed)
Clinical Social Work Assessment  Patient Details  Name: Tammie Clay MRN: [id number] Date of Birth: [birth year]  Date of referral:  04/05/15               Reason for consult:   (COPD gold)                Permission sought to share information with:    Permission granted to share information::     Name::        Agency::     Relationship::     Contact Information:     Housing/Transportation Living arrangements for the past 2 months:  Single Family Home Source of Information:  Patient, Adult Children (daughter Tammie Clay) Patient Interpreter Needed:  None Criminal Activity/Legal Involvement Pertinent to Current Situation/Hospitalization:    Significant Relationships:  Significant Other, Adult Children Lives with:    Do you feel safe going back to the place where you live?    Need for family participation in patient care:     Care giving concerns:  No caregiver   Social Worker assessment / plan:  CSW was consulted for COPD gold.  Per COPD gold protocol pt must have presented to ED 3 times within 6 months, have COPD diagnosis and complain of anxiety.  CSW reviewed chart and this is pt's first admission where she discussed chest pain.  CSW met with pt and her daughter, Tammie Clay at bedside to assess for services.  CSW prompted pt to discuss history and needs.  CSW encouraged pt to discuss thoughts and feelings related to her health and diagnoses.  CSW provided supportive listening and explained possible services that pt may qualify for when she is ready for discharge.  CSW signing off and asks to be re consulted for any pt needs.   Employment status:  Retired Forensic scientist:  [employer] PT Recommendations:  Not assessed at this time Information / Referral to community resources:     Patient/Family's Response to care:  Pt and daughter discussed pt history.  Pt reported living with her significant other and that he is home during the day and supportive of her.  Pt reported that she will  begin radiation on Tuesday for 10 days and will have the radiation daily.  Pt reported that she was "tired" but in good spirits.  Pt denied any history of depression, anxiety or any mental health diagnosis.  Pt denied any current depression or anxiety stating that she has some "bad days".  Pt's daughter reported that pt has not had any problems with depression or anxiety and has never seen any counselor for mental health problems.  Pt reported that she had never been to any rehab/SNF facility and that she did not want to go even if she was weak after her radiation.  Pt discussed having at least 50 relatives that live in town and are supportive.  Pt reported that she did not need any services now or at discharge but agreed to let her RN or MD know if there was a need during her hospital stay.  Patient/Family's Understanding of and Emotional Response to Diagnosis, Current Treatment, and Prognosis:  Pt appeared to be handling her cancer diagnosis well and stated that she was "dealing with the hand" that was given her. Pt's only complaint was that she was "tired".   Pt's daughter also reported that pt was doing well with her mental health while dealing with her physical health problems.   Emotional Assessment Appearance:  Appears younger  than stated age Attitude/Demeanor/Rapport:   (cooperative) Affect (typically observed):  Accepting Orientation:  Oriented to Self, Oriented to Place, Oriented to  Time, Oriented to Situation Alcohol / Substance use:    Psych involvement (Current and /or in the community):  No (Comment)  Discharge Needs  Concerns to be addressed:    Readmission within the last 30 days:  No Current discharge risk:    Barriers to Discharge:  No Barriers Identified   Carlean Jews, LCSW 04/05/2015, 11:03 AM

## 2015-04-06 DIAGNOSIS — E440 Moderate protein-calorie malnutrition: Secondary | ICD-10-CM | POA: Insufficient documentation

## 2015-04-06 LAB — BASIC METABOLIC PANEL
Anion gap: 11 (ref 5–15)
BUN: 21 mg/dL — AB (ref 6–20)
CALCIUM: 9.1 mg/dL (ref 8.9–10.3)
CO2: 27 mmol/L (ref 22–32)
CREATININE: 0.99 mg/dL (ref 0.44–1.00)
Chloride: 102 mmol/L (ref 101–111)
GFR calc Af Amer: >60 mL/min (ref >60)
GFR, EST NON AFRICAN AMERICAN: 56 mL/min — AB (ref >60)
Glucose, Bld: 137 mg/dL — ABNORMAL HIGH (ref 65–99)
Potassium: 3.9 mmol/L (ref 3.5–5.1)
SODIUM: 140 mmol/L (ref 135–145)

## 2015-04-06 LAB — URINE CULTURE

## 2015-04-06 LAB — CBC
HCT: 27.3 % — ABNORMAL LOW (ref 36.0–46.0)
Hemoglobin: 8.7 g/dL — ABNORMAL LOW (ref 12.0–15.0)
MCH: 30.3 pg (ref 26.0–34.0)
MCHC: 31.9 g/dL (ref 30.0–36.0)
MCV: 95.1 fL (ref 78.0–100.0)
PLATELETS: 213 10*3/uL (ref 150–400)
RBC: 2.87 MIL/uL — ABNORMAL LOW (ref 3.87–5.11)
RDW: 19 % — AB (ref 11.5–15.5)
WBC: 8.9 10*3/uL (ref 4.0–10.5)

## 2015-04-06 MED ORDER — ENSURE ENLIVE PO LIQD: 237.0000 mL | Freq: Two times a day (BID) | ORAL | Status: DC | PRN

## 2015-04-06 MED ORDER — METHYLPREDNISOLONE SODIUM SUCC 40 MG IJ SOLR
40.0000 mg | Freq: Two times a day (BID) | INTRAMUSCULAR | Status: DC
  Administered 2015-04-07 – 2015-04-09 (×5): 40 mg via INTRAVENOUS
  Filled 2015-04-06 (×7): qty 1

## 2015-04-06 NOTE — Care Management Note (Signed)
Case Management Note  Patient Details  Name: Tammie Clay MRN: [id number] Date of Birth: [birth year]  Subjective/Objective:         copd           Action/Plan:COPD Gold ProtocolDate: April 06, 2015 Chart reviewed for concurrent status and case management needs. Will continue to follow patient for changes and needs: Tammie Clay, BSN, RN, [place]   [phone]  Expected Discharge Date:                  Expected Discharge Plan:  Home/Self Care  In-House Referral:  NA  Discharge planning Services  CM Consult  Post Acute Care Choice:  NA Choice offered to:  NA  DME Arranged:    DME Agency:     HH Arranged:    HH Agency:     Status of Service:  In process, will continue to follow  Medicare Important Message Given:    Date Medicare IM Given:    Medicare IM give by:    Date Additional Medicare IM Given:    Additional Medicare Important Message give by:     If discussed at [place] of Stay Meetings, dates discussed:    Additional Comments:  Tammie Cha, RN 04/06/2015, 11:52 AM

## 2015-04-06 NOTE — Evaluation (Signed)
  Occupational Therapy Evaluation Patient Details Name: LARAMIE GELLES MRN: [id number] DOB: April 23, 1945 Today's Date: 04-24-15    History of Present Illness is a 70 y.o. female with PMH of metastasized squamous cell lung cancer on chemotherapy (last chemotherapy was on Wednesday), hypertension, hyperlipidemia, COPD, GERD, CAD, anxiety, who presents with fever, shortness of breath   Clinical Impression   Pt overall at baseline- no further OT needs.   Follow Up Recommendations  No OT follow up    Equipment Recommendations  None recommended by OT    Recommendations for Other Services       Precautions / Restrictions Precautions Precautions: None Restrictions Weight Bearing Restrictions: No      Mobility Bed Mobility Overal bed mobility: Independent                Transfers Overall transfer level: Independent                         ADL Overall ADL's : At baseline                                       General ADL Comments: pt overall at baseline. pt did need a couple of rest breaks during bathing but did well.  No DME needs               Pertinent Vitals/Pain Pain Assessment: No/denies pain     Hand Dominance     Extremity/Trunk Assessment Upper Extremity Assessment Upper Extremity Assessment: Overall WFL for tasks assessed           Communication Communication Communication: No difficulties   Cognition Arousal/Alertness: Awake/alert Behavior During Therapy: WFL for tasks assessed/performed Overall Cognitive Status: Within Functional Limits for tasks assessed                                Home Living Family/patient expects to be discharged to:: Private residence Living Arrangements: Spouse/significant other   Type of Home: House                                  Prior Functioning/Environment Level of Independence: Independent                                       End  of Session Nurse Communication: Mobility status  Activity Tolerance: Patient tolerated treatment well Patient left: in chair;with call bell/phone within reach   Time: 6962-9528 OT Time Calculation (min): 29 min Charges:  OT General Charges $OT Visit: 1 Procedure OT Evaluation $OT Eval Low Complexity: 1 Procedure OT Treatments $Self Care/Home Management : 8-22 mins G-Codes:    Payton Mccallum D Apr 24, 2015, 10:23 AM

## 2015-04-06 NOTE — Progress Notes (Signed)
PROGRESS NOTE  Tammie Clay MPN:[id number] DOB: Jan 14, 1946 DOA: 04/04/2015 PCP: Mayra Neer, MD  Assessment/Plan: COPD exacerbation and sepsis due to ? PNA vs flu vs viral illness -will admit patient to telemetry bed  -Nebulizers: scheduled atrovent and prn Xopenex -Solu-Medrol-wean as tolerated -IV vancomycin and cefepime given patient is immunosuppressed from chemotherapy  -Mucinex for cough  -Urine S. pneumococcal antigen -Follow up blood culture x2, sputum culture, respiratory virus panel - Flu pcr negative - Pro-calcitonin- only mildly elevated  Acute resp failure -per ER PA O2 sats are high 80s on RA -home O2 study in AM-- patient tearful when O2 mentioned  Fever -r/o flu  HTN: -IV hydralazine when necessary  GERD: -Pepcid  Anemia -? Etiology -trend -baseline 10-- ? Volume dilution  Stage IV squamous cell carcinoma of left lung ([place]): Followed up by Dr. [name], on chemotherapy. Last dose was on Wednesday. -Follow-up with Dr. [name]   Code Status: partial Family Communication: significant other at bedside Disposition Plan:    Consultants:    Procedures:      HPI/Subjective: Feeling better, still with some SOB and tachycardia when getting up moving  Objective: Filed Vitals:   04/06/15 0938 04/06/15 1359  BP: 132/89 134/90  Pulse:  94  Temp:  98.4 F (36.9 C)  Resp:  20    Intake/Output Summary (Last 24 hours) at 04/06/15 1458 Last data filed at 04/06/15 0935  Gross per 24 hour  Intake    600 ml  Output      0 ml  Net    600 ml   There were no vitals filed for this visit.  Exam:   General:  Awake, NAD  Cardiovascular: rrr  Respiratory: no wheezing, diminished  Abdomen: +BS, soft  Musculoskeletal: no edema   Data Reviewed: Basic Metabolic Panel:  Recent Labs Lab 03/31/15 1251 04/04/15 1953 04/06/15 0530  NA 139 136 140  K 4.2 3.7 3.9  CL  --  97* 102  CO2 30* 28 27  GLUCOSE 115 115* 137*  BUN 21.4 24* 21*   CREATININE 1.0 1.00 0.99  CALCIUM 9.8 8.8* 9.1   Liver Function Tests:  Recent Labs Lab 03/31/15 1251 04/04/15 1953  AST 20 22  ALT 14 15  ALKPHOS 86 74  BILITOT 0.36 0.9  PROT 7.4 7.0  ALBUMIN 3.7 4.0   No results for input(s): LIPASE, AMYLASE in the last 168 hours. No results for input(s): AMMONIA in the last 168 hours. CBC:  Recent Labs Lab 03/31/15 1251 04/04/15 1953 04/06/15 0530  WBC 7.3 12.2* 8.9  NEUTROABS 5.4 11.7*  --   HGB 10.0* 9.0* 8.7*  HCT 30.8* 27.4* 27.3*  MCV 91.3 92.9 95.1  PLT 250 243 213   Cardiac Enzymes: No results for input(s): CKTOTAL, CKMB, CKMBINDEX, TROPONINI in the last 168 hours. BNP (last 3 results) No results for input(s): BNP in the last 8760 hours.  ProBNP (last 3 results) No results for input(s): PROBNP in the last 8760 hours.  CBG: No results for input(s): GLUCAP in the last 168 hours.  Recent Results (from the past 240 hour(s))  Culture, blood (routine x 2)     Status: None (Preliminary result)   Collection Time: 04/04/15  9:37 PM  Result Value Ref Range Status   Specimen Description BLOOD LEFT ANTECUBITAL  Final   Special Requests BOTTLES DRAWN AEROBIC AND ANAEROBIC 10ML  Final   Culture   Final    NO GROWTH 1 DAY Performed at [facility]  Report Status PENDING  Incomplete  Culture, blood (routine x 2)     Status: None (Preliminary result)   Collection Time: 04/04/15  9:40 PM  Result Value Ref Range Status   Specimen Description BLOOD LEFT ANTECUBITAL  Final   Special Requests BOTTLES DRAWN AEROBIC ONLY 10CC  Final   Culture   Final    NO GROWTH 1 DAY Performed at [facility]    Report Status PENDING  Incomplete  Urine culture     Status: None   Collection Time: 04/05/15 12:41 AM  Result Value Ref Range Status   Specimen Description URINE, CLEAN CATCH  Final   Special Requests NONE  Final   Culture   Final    MULTIPLE SPECIES PRESENT, SUGGEST RECOLLECTION Performed at [facility]     Report Status 04/06/2015 FINAL  Final     Studies: Dg Chest 2 View  04/04/2015  CLINICAL DATA:  70 year old current history of left upper lobe lung cancer, presenting with 2 day history of fever and shortness of breath. Patient's most recent chemotherapy treatment was 4 days ago. EXAM: CHEST  2 VIEW COMPARISON:  CT chest 03/30/2015 and earlier. Two-view chest x-ray 10/01/2014. FINDINGS: Cardiac silhouette upper normal in size, unchanged. Thoracic aorta atherosclerotic, unchanged. Medial left upper lobe lung mass as noted on recent CT is partially obscured by the port on the PA image. Chronic elevation left hemidiaphragm with chronic scar/atelectasis in the left lower lobe, unchanged. The multiple small nodules identified on CT are less apparent on chest x-ray. Lungs otherwise clear. No pleural effusions. Degenerative changes throughout the thoracic spine. Left subclavian Port-A-Cath tip in the upper SVC. IMPRESSION: Medial left upper lobe lung mass as noted on the recent chest CT. Chronic elevation left hemidiaphragm with chronic scar/atelectasis in the left lower lobe. No acute cardiopulmonary disease. Electronically Signed   By: Evangeline Dakin M.D.   On: 04/04/2015 18:46    Scheduled Meds: . ceFEPime (MAXIPIME) IV  1 g Intravenous Q24H  . dextromethorphan-guaiFENesin  1 tablet Oral BID  . DULoxetine  30 mg Oral Daily  . enoxaparin (LOVENOX) injection  40 mg Subcutaneous Q24H  . famotidine  20 mg Oral BID  . fexofenadine  60 mg Oral Daily  . ipratropium-albuterol  3 mL Nebulization TID  . lisinopril  20 mg Oral Daily  . [START ON 04/07/2015] methylPREDNISolone (SOLU-MEDROL) injection  40 mg Intravenous Q12H  . metoprolol succinate  50 mg Oral q morning - 10a  . multivitamin with minerals  1 tablet Oral Daily  . vancomycin  500 mg Intravenous Q12H   Continuous Infusions:  Antibiotics Given (last 72 hours)    Date/Time Action Medication Dose Rate   04/05/15 1457 Given   vancomycin  (VANCOCIN) 500 mg in sodium chloride 0.9 % 100 mL IVPB 500 mg 100 mL/hr   04/06/15 0202 Given   ceFEPIme (MAXIPIME) 1 g in dextrose 5 % 50 mL IVPB 1 g 100 mL/hr   04/06/15 0202 Given   vancomycin (VANCOCIN) 500 mg in sodium chloride 0.9 % 100 mL IVPB 500 mg 100 mL/hr      Principal Problem:   Acute on chronic respiratory failure with hypoxia (HCC) Active Problems:   GERD (gastroesophageal reflux disease)   COPD exacerbation (HCC)   Stage IV squamous cell carcinoma of left lung (HCC)   Antineoplastic chemotherapy induced anemia   Chemotherapy-induced neuropathy ([place])   Essential (primary) hypertension   Sepsis ([place])   Fever   Malnutrition of moderate  degree    Time spent: 25 min    [place] [facility] Pager [phone]. If 7PM-7AM, please contact night-coverage at [email or web address], password [facility] 04/06/2015, 2:58 PM  LOS: 1 day

## 2015-04-06 NOTE — Progress Notes (Signed)
Initial Nutrition Assessment  DOCUMENTATION CODES:   Non-severe (moderate) malnutrition in context of chronic illness  INTERVENTION:  - Will order Ensure Enlive BID PRN, each supplement provides 350 kcal and 20 grams of protein - Recommend liberalization of diet from Heart Healthy to Regular, if medically feasible, per pt request - RD will continue to monitor for needs  NUTRITION DIAGNOSIS:   Increased nutrient needs related to catabolic illness, cancer and cancer related treatments as evidenced by estimated needs.  GOAL:   Patient will meet greater than or equal to 90% of their needs  MONITOR:   PO intake, Supplement acceptance, Weight trends, Labs, Skin, I & O's  REASON FOR ASSESSMENT:   Malnutrition Screening Tool, Consult Assessment of nutrition requirement/status  ASSESSMENT:   70 y.o. 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  Pt seen for MST. And consult. BMI indicates overweight status. Per chart review, pt consumed 100% of breakfast this AM. Pt eating sandwich for lunch at time of RD visit and consumed 50% of it at that time and indicated plan to consume the rest of it. Pt states her appetite has been decreased while undergoing chemo, partly related to taste alterations. Pt states that unless foods are very salty or very sweet they taste bland to her and she lacks the desire to eat. Pt states that she is very aware of the need to eat well while undergoing treatment and so she supplements with Ensure at home when she is not able to eat well. Discussed adding Ensure PRN during admission.   Pt denies chewing or swallowing difficulties. She states that she has unintentionally lost 30 lbs in the past 1 year. This would indicate 21% body weight loss in this time frame which is significant. Per chart review, pt  has lost 12 lbs (8% body weight) in the past 6 months which is not significant for time frame. Mild muscle and moderate fat wasting to upper body; did not assess lower body as pt was eating with bedside table over her.   Likely not fully meeting needs. Pt expresses desire for diet liberalization so that she may order items that are more flavorful for her so that she can increase PO intake; will page MD concerning this. Medications reviewed. Labs reviewed.   Diet Order:  Diet Heart Room service appropriate?: Yes; Fluid consistency:: Thin  Skin:  Reviewed, no issues  Last BM:  2/26  Height:   Ht Readings from Last 1 Encounters:  04/05/15 '5\' 2"'$  (1.575 m)    Weight:   Wt Readings from Last 1 Encounters:  04/02/15 145 lb 3.2 oz (65.862 kg)    Ideal Body Weight:  50 kg (kg)  BMI:  26.53 kg/m2  Estimated Nutritional Needs:   Kcal:  4270-6237  Protein:  90-105 grams  Fluid:  2-2.2 L/day  EDUCATION NEEDS:   No education needs identified at this time     Jarome Matin, RD, LDN Inpatient Clinical Dietitian Pager # [phone] After hours/weekend pager # [phone]

## 2015-04-06 NOTE — Progress Notes (Signed)
PT Cancellation Note  Patient Details Name: KIMYETTA FLOTT MRN: [id number] DOB: 10-06-1945   Cancelled Treatment:    Reason Eval/Treat Not Completed: PT screened, no needs identified, will sign off. Spoke with pt who denied need for PT services. RN present and stated pt has been walking to/from bathroom. Will have nursing ambulate with pt in hallways as able. Please reorder if mobility needs change. Thanks.    Weston Anna, MPT Pager: [phone]

## 2015-04-07 ENCOUNTER — Telehealth: Payer: Self-pay | Admitting: Oncology

## 2015-04-07 ENCOUNTER — Telehealth: Payer: Self-pay | Admitting: Internal Medicine

## 2015-04-07 ENCOUNTER — Ambulatory Visit
Admit: 2015-04-07 | Discharge: 2015-04-07 | Disposition: A | Discharge status: Home / Self Care | Payer: Medicare Other | Attending: Radiation Oncology | Admitting: Radiation Oncology

## 2015-04-07 ENCOUNTER — Ambulatory Visit: Admission: RE | Admit: 2015-04-07 | Payer: Medicare Other | Source: Ambulatory Visit | Admitting: Radiation Oncology

## 2015-04-07 ENCOUNTER — Telehealth: Payer: Self-pay | Admitting: Medical Oncology

## 2015-04-07 LAB — BASIC METABOLIC PANEL
Anion gap: 9 (ref 5–15)
BUN: 26 mg/dL — ABNORMAL HIGH (ref 6–20)
CALCIUM: 9.3 mg/dL (ref 8.9–10.3)
CHLORIDE: 102 mmol/L (ref 101–111)
CO2: 30 mmol/L (ref 22–32)
CREATININE: 0.92 mg/dL (ref 0.44–1.00)
GFR calc Af Amer: >60 mL/min (ref >60)
GFR calc non Af Amer: >60 mL/min (ref >60)
GLUCOSE: 115 mg/dL — AB (ref 65–99)
Potassium: 4.1 mmol/L (ref 3.5–5.1)
Sodium: 141 mmol/L (ref 135–145)

## 2015-04-07 LAB — CBC
HEMATOCRIT: 28.5 % — AB (ref 36.0–46.0)
Hemoglobin: 9 g/dL — ABNORMAL LOW (ref 12.0–15.0)
MCH: 29.9 pg (ref 26.0–34.0)
MCHC: 31.6 g/dL (ref 30.0–36.0)
MCV: 94.7 fL (ref 78.0–100.0)
Platelets: 205 10*3/uL (ref 150–400)
RBC: 3.01 MIL/uL — ABNORMAL LOW (ref 3.87–5.11)
RDW: 19 % — AB (ref 11.5–15.5)
WBC: 9.4 10*3/uL (ref 4.0–10.5)

## 2015-04-07 LAB — RESPIRATORY VIRUS PANEL
Adenovirus: NEGATIVE
Influenza A: NEGATIVE
Influenza B: NEGATIVE
Metapneumovirus: NEGATIVE
Parainfluenza 1: NEGATIVE
Parainfluenza 2: NEGATIVE
Parainfluenza 3: NEGATIVE
Respiratory Syncytial Virus A: NEGATIVE
Respiratory Syncytial Virus B: NEGATIVE
Rhinovirus: NEGATIVE

## 2015-04-07 NOTE — Telephone Encounter (Signed)
per pof to CX 3/1 appts pt in hospital

## 2015-04-07 NOTE — Progress Notes (Signed)
PROGRESS NOTE  Tammie Clay FIE:[id number] DOB: [birth year] DOA: 04/04/2015 PCP: Mayra Neer, MD  Assessment/Plan: COPD exacerbation and sepsis due to ? PNA vs flu vs viral illness -Nebulizers: scheduled atrovent and prn Xopenex -Solu-Medrol-wean as tolerated-- had some worsening SOB today, will leave on 40 BID today -IV vancomycin and cefepime given patient is immunosuppressed from chemotherapy- narrow tomorrow if remains afebrile -Mucinex for cough  -Urine S. pneumococcal antigen -Follow up blood culture x2, sputum culture, respiratory virus panel - Flu pcr negative - Pro-calcitonin- only mildly elevated  Acute resp failure -per ER PA O2 sats are high 80s on RA -home O2 study  Fever -flu negative  HTN: -IV hydralazine when necessary  GERD: -Pepcid  Anemia -? Etiology -trend -baseline 10-- ? Volume dilution  Stage IV squamous cell carcinoma of left lung ([place]): Followed up by Dr. [name], on chemotherapy. Last dose was on Wednesday. -Follow-up with Dr. [name]   Code Status: partial Family Communication: no family at bedside Disposition Plan:    Consultants:    Procedures:      HPI/Subjective: Some SOB worse today  Objective: Filed Vitals:   04/07/15 0523 04/07/15 1424  BP: 160/97 154/84  Pulse: 81 98  Temp: 98 F (36.7 C) 98.3 F (36.8 C)  Resp: 20 18    Intake/Output Summary (Last 24 hours) at 04/07/15 1558 Last data filed at 04/07/15 1228  Gross per 24 hour  Intake      0 ml  Output    201 ml  Net   -201 ml   There were no vitals filed for this visit.  Exam:   General:  Awake, NAD  Cardiovascular: rrr  Respiratory: no wheezing, diminished  Abdomen: +BS, soft  Musculoskeletal: no edema   Data Reviewed: Basic Metabolic Panel:  Recent Labs Lab 04/04/15 1953 04/06/15 0530 04/07/15 0455  NA 136 140 141  K 3.7 3.9 4.1  CL 97* 102 102  CO2 '28 27 30  '$ GLUCOSE 115* 137* 115*  BUN 24* 21* 26*  CREATININE 1.00 0.99 0.92   CALCIUM 8.8* 9.1 9.3   Liver Function Tests:  Recent Labs Lab 04/04/15 1953  AST 22  ALT 15  ALKPHOS 74  BILITOT 0.9  PROT 7.0  ALBUMIN 4.0   No results for input(s): LIPASE, AMYLASE in the last 168 hours. No results for input(s): AMMONIA in the last 168 hours. CBC:  Recent Labs Lab 04/04/15 1953 04/06/15 0530 04/07/15 0455  WBC 12.2* 8.9 9.4  NEUTROABS 11.7*  --   --   HGB 9.0* 8.7* 9.0*  HCT 27.4* 27.3* 28.5*  MCV 92.9 95.1 94.7  PLT 243 213 205   Cardiac Enzymes: No results for input(s): CKTOTAL, CKMB, CKMBINDEX, TROPONINI in the last 168 hours. BNP (last 3 results) No results for input(s): BNP in the last 8760 hours.  ProBNP (last 3 results) No results for input(s): PROBNP in the last 8760 hours.  CBG: No results for input(s): GLUCAP in the last 168 hours.  Recent Results (from the past 240 hour(s))  Culture, blood (routine x 2)     Status: None (Preliminary result)   Collection Time: 04/04/15  9:37 PM  Result Value Ref Range Status   Specimen Description BLOOD LEFT ANTECUBITAL  Final   Special Requests BOTTLES DRAWN AEROBIC AND ANAEROBIC 10ML  Final   Culture   Final    NO GROWTH 2 DAYS Performed at [facility]    Report Status PENDING  Incomplete  Culture, blood (routine x 2)  Status: None (Preliminary result)   Collection Time: 04/04/15  9:40 PM  Result Value Ref Range Status   Specimen Description BLOOD LEFT ANTECUBITAL  Final   Special Requests BOTTLES DRAWN AEROBIC ONLY 10CC  Final   Culture   Final    NO GROWTH 2 DAYS Performed at [facility]    Report Status PENDING  Incomplete  Urine culture     Status: None   Collection Time: 04/05/15 12:41 AM  Result Value Ref Range Status   Specimen Description URINE, CLEAN CATCH  Final   Special Requests NONE  Final   Culture   Final    MULTIPLE SPECIES PRESENT, SUGGEST RECOLLECTION Performed at [facility]    Report Status 04/06/2015 FINAL  Final  Respiratory virus  panel     Status: None   Collection Time: 04/05/15  1:12 PM  Result Value Ref Range Status   Respiratory Syncytial Virus A Negative Negative Final   Respiratory Syncytial Virus B Negative Negative Final   Influenza A Negative Negative Final   Influenza B Negative Negative Final   Parainfluenza 1 Negative Negative Final   Parainfluenza 2 Negative Negative Final   Parainfluenza 3 Negative Negative Final   Metapneumovirus Negative Negative Final   Rhinovirus Negative Negative Final   Adenovirus Negative Negative Final    Comment: (NOTE) Performed At: [facility] [place], [place] [id number] Lindon Romp MD JW:[id number]      Studies: No results found.  Scheduled Meds: . ceFEPime (MAXIPIME) IV  1 g Intravenous Q24H  . dextromethorphan-guaiFENesin  1 tablet Oral BID  . DULoxetine  30 mg Oral Daily  . enoxaparin (LOVENOX) injection  40 mg Subcutaneous Q24H  . famotidine  20 mg Oral BID  . fexofenadine  60 mg Oral Daily  . ipratropium-albuterol  3 mL Nebulization TID  . lisinopril  20 mg Oral Daily  . methylPREDNISolone (SOLU-MEDROL) injection  40 mg Intravenous Q12H  . metoprolol succinate  50 mg Oral q morning - 10a  . multivitamin with minerals  1 tablet Oral Daily  . vancomycin  500 mg Intravenous Q12H   Continuous Infusions:  Antibiotics Given (last 72 hours)    Date/Time Action Medication Dose Rate   04/05/15 1457 Given   vancomycin (VANCOCIN) 500 mg in sodium chloride 0.9 % 100 mL IVPB 500 mg 100 mL/hr   04/06/15 0202 Given   ceFEPIme (MAXIPIME) 1 g in dextrose 5 % 50 mL IVPB 1 g 100 mL/hr   04/06/15 0202 Given   vancomycin (VANCOCIN) 500 mg in sodium chloride 0.9 % 100 mL IVPB 500 mg 100 mL/hr   04/06/15 1512 Given   vancomycin (VANCOCIN) 500 mg in sodium chloride 0.9 % 100 mL IVPB 500 mg 100 mL/hr   04/07/15 0210 Given   ceFEPIme (MAXIPIME) 1 g in dextrose 5 % 50 mL IVPB 1 g 100 mL/hr   04/07/15 0210 Given   vancomycin (VANCOCIN) 500 mg  in sodium chloride 0.9 % 100 mL IVPB 500 mg 100 mL/hr   04/07/15 1514 Given   vancomycin (VANCOCIN) 500 mg in sodium chloride 0.9 % 100 mL IVPB 500 mg 100 mL/hr      Principal Problem:   Acute on chronic respiratory failure with hypoxia (HCC) Active Problems:   GERD (gastroesophageal reflux disease)   COPD exacerbation (HCC)   Stage IV squamous cell carcinoma of left lung (HCC)   Antineoplastic chemotherapy induced anemia   Chemotherapy-induced neuropathy ([place])   Essential (primary)  hypertension   Sepsis ([place])   Fever   Malnutrition of moderate degree    Time spent: 25 min    [place] [facility] Pager [phone]. If 7PM-7AM, please contact night-coverage at [email or web address], password [facility] 04/07/2015, 3:58 PM  LOS: 2 days

## 2015-04-07 NOTE — Telephone Encounter (Signed)
Pt said to cancel appts for tomorrow. Request sent.

## 2015-04-07 NOTE — Telephone Encounter (Signed)
Tammie Clay called and said she is in the hospital with pneumonia.  She said that she is probably going to be discharged today but is not sure.  She wants to cancel her CT [facility] for today and will call us back to reschedule once she is discharged.  Notified Candace, RT in CT SIM.

## 2015-04-08 ENCOUNTER — Other Ambulatory Visit: Payer: Medicare Other

## 2015-04-08 ENCOUNTER — Ambulatory Visit: Payer: Medicare Other

## 2015-04-08 DIAGNOSIS — J441 Chronic obstructive pulmonary disease with (acute) exacerbation: Secondary | ICD-10-CM

## 2015-04-08 DIAGNOSIS — J9621 Acute and chronic respiratory failure with hypoxia: Secondary | ICD-10-CM

## 2015-04-08 MED ORDER — IPRATROPIUM-ALBUTEROL 0.5-2.5 (3) MG/3ML IN SOLN
3.0000 mL | RESPIRATORY_TRACT | Status: DC
  Administered 2015-04-08 – 2015-04-09 (×6): 3 mL via RESPIRATORY_TRACT
  Filled 2015-04-08 (×6): qty 3

## 2015-04-08 MED ORDER — DOXYCYCLINE HYCLATE 100 MG PO TABS
100.0000 mg | ORAL_TABLET | Freq: Two times a day (BID) | ORAL | Status: DC
  Administered 2015-04-08 – 2015-04-09 (×3): 100 mg via ORAL
  Filled 2015-04-08 (×4): qty 1

## 2015-04-08 NOTE — Progress Notes (Signed)
Pharmacy Antibiotic Note  Tammie Clay is a 70 y.o. female admitted on 04/04/2015 with sepsis due to ? PNA, ? COPD exacerabation.  CXR showed mass, but no infiltration.  Pharmacy consulted for Cefepime & Vancomycin dosing.  Pt started on broad spec d/t immunocompromised status given h/o lung cancer, undergoing chemotherapy.  Today is day #4 of broad spec antibiotics and source of infection not yet identified and patient remaining afebrile.  Plan:  Continue Cefepime 1gm IV q24h, CrCl~49 ml/min  Continue Vancomycin '500mg'$  IV q12h  Recommend narrowing therapy.  Would consider Levaquin given Pseudomonas risk factor of pt undergoing active chemotherapy.  Height: '5\' 2"'$  (157.5 cm) IBW/kg (Calculated) : 50.1  Temp (24hrs), Avg:98.3 F (36.8 C), Min:98 F (36.7 C), Max:98.7 F (37.1 C)   Recent Labs Lab 04/04/15 1953 04/04/15 2003 04/04/15 2255 04/06/15 0530 04/07/15 0455  WBC 12.2*  --   --  8.9 9.4  CREATININE 1.00  --   --  0.99 0.92  LATICACIDVEN  --  1.19 1.15  --   --     Estimated Creatinine Clearance: 49.9 mL/min (by C-G formula based on Cr of 0.92).    No Known Allergies  Antimicrobials this admission: 2/26 Cefepime >>   2/26 Vanc >>  Dose adjustments this admission:    Microbiology results: 2/26 BCx:  ngtd x 2 days 2/26 UCx: mult species, suggest recollection 2/25 influenza: neg 2/26 resp virus panel: neg Strep pneumo ur ag: ordered, not collected  Thank you for allowing pharmacy to be a part of this patient's care.  Hershal Coria, PharmD 04/08/2015 7:47 AM

## 2015-04-08 NOTE — Progress Notes (Signed)
PROGRESS NOTE  Tammie Clay FGH:[id number] DOB: Mar 09, 1945 DOA: 04/04/2015 PCP: Mayra Neer, MD  Assessment/Plan: COPD exacerbation and sepsis due to ? PNA vs flu vs viral illness -Nebulizers: scheduled atrovent and prn Xopenex -Solu-Medrol-wean as tolerated-- had some worsening SOB today, will leave on 40 BID today -IV vancomycin and cefepime given patient is immunosuppressed from chemotherapy- narrow tomorrow if remains afebrile -Mucinex for cough  -Urine S. pneumococcal antigen - Flu pcr negative -Showing slow clinical improvement. Patient reporting significant shortness of breath when she ambulated to the restroom. Continue providing nebulizer treatments every 4 hours. -Respiratory viral panel negative  Acute resp failure -Evidence by patient presented in respiratory distress having an increase in oxygen requirement to 3 L [place]  Fever -flu negative  HTN: -IV hydralazine when necessary  GERD: -Pepcid  Anemia -? Etiology -trend -baseline 10-- ? Volume dilution  Stage IV squamous cell carcinoma of left lung ([place]): Followed up by Dr. [name], on chemotherapy. Last dose was on Wednesday. -Follow-up with Dr. [name]   Code Status: partial Family Communication: no family at bedside Disposition Plan: Anticipate discharge in the next 24 hours if she continues to show clinical improvement with home health services. Face-to-face completed   Consultants:    Procedures:      HPI/Subjective: Some SOB worse today  Objective: Filed Vitals:   04/08/15 0645 04/08/15 1200  BP: 185/108 167/104  Pulse:  96  Temp:    Resp:  20    Intake/Output Summary (Last 24 hours) at 04/08/15 1319 Last data filed at 04/08/15 0825  Gross per 24 hour  Intake    690 ml  Output      1 ml  Net    689 ml   There were no vitals filed for this visit.  Exam:   General:  She appears this day at rest, having audible wheezing  Cardiovascular: rrr  Respiratory: Positive  expiratory wheezes with prolonged expiratory phase  Abdomen: +BS, soft  Musculoskeletal: no edema   Data Reviewed: Basic Metabolic Panel:  Recent Labs Lab 04/04/15 1953 04/06/15 0530 04/07/15 0455  NA 136 140 141  K 3.7 3.9 4.1  CL 97* 102 102  CO2 '28 27 30  '$ GLUCOSE 115* 137* 115*  BUN 24* 21* 26*  CREATININE 1.00 0.99 0.92  CALCIUM 8.8* 9.1 9.3   Liver Function Tests:  Recent Labs Lab 04/04/15 1953  AST 22  ALT 15  ALKPHOS 74  BILITOT 0.9  PROT 7.0  ALBUMIN 4.0   No results for input(s): LIPASE, AMYLASE in the last 168 hours. No results for input(s): AMMONIA in the last 168 hours. CBC:  Recent Labs Lab 04/04/15 1953 04/06/15 0530 04/07/15 0455  WBC 12.2* 8.9 9.4  NEUTROABS 11.7*  --   --   HGB 9.0* 8.7* 9.0*  HCT 27.4* 27.3* 28.5*  MCV 92.9 95.1 94.7  PLT 243 213 205   Cardiac Enzymes: No results for input(s): CKTOTAL, CKMB, CKMBINDEX, TROPONINI in the last 168 hours. BNP (last 3 results) No results for input(s): BNP in the last 8760 hours.  ProBNP (last 3 results) No results for input(s): PROBNP in the last 8760 hours.  CBG: No results for input(s): GLUCAP in the last 168 hours.  Recent Results (from the past 240 hour(s))  Culture, blood (routine x 2)     Status: None (Preliminary result)   Collection Time: 04/04/15  9:37 PM  Result Value Ref Range Status   Specimen Description BLOOD LEFT ANTECUBITAL  Final   Special  Requests BOTTLES DRAWN AEROBIC AND ANAEROBIC 10ML  Final   Culture   Final    NO GROWTH 2 DAYS Performed at [facility]    Report Status PENDING  Incomplete  Culture, blood (routine x 2)     Status: None (Preliminary result)   Collection Time: 04/04/15  9:40 PM  Result Value Ref Range Status   Specimen Description BLOOD LEFT ANTECUBITAL  Final   Special Requests BOTTLES DRAWN AEROBIC ONLY 10CC  Final   Culture   Final    NO GROWTH 2 DAYS Performed at [facility]    Report Status PENDING  Incomplete    Urine culture     Status: None   Collection Time: 04/05/15 12:41 AM  Result Value Ref Range Status   Specimen Description URINE, CLEAN CATCH  Final   Special Requests NONE  Final   Culture   Final    MULTIPLE SPECIES PRESENT, SUGGEST RECOLLECTION Performed at [facility]    Report Status 04/06/2015 FINAL  Final  Respiratory virus panel     Status: None   Collection Time: 04/05/15  1:12 PM  Result Value Ref Range Status   Respiratory Syncytial Virus A Negative Negative Final   Respiratory Syncytial Virus B Negative Negative Final   Influenza A Negative Negative Final   Influenza B Negative Negative Final   Parainfluenza 1 Negative Negative Final   Parainfluenza 2 Negative Negative Final   Parainfluenza 3 Negative Negative Final   Metapneumovirus Negative Negative Final   Rhinovirus Negative Negative Final   Adenovirus Negative Negative Final    Comment: (NOTE) Performed At: [facility] [place], [place] [id number] Lindon Romp MD WU:[id number]      Studies: No results found.  Scheduled Meds: . dextromethorphan-guaiFENesin  1 tablet Oral BID  . doxycycline  100 mg Oral Q12H  . DULoxetine  30 mg Oral Daily  . enoxaparin (LOVENOX) injection  40 mg Subcutaneous Q24H  . famotidine  20 mg Oral BID  . fexofenadine  60 mg Oral Daily  . ipratropium-albuterol  3 mL Nebulization Q4H  . lisinopril  20 mg Oral Daily  . methylPREDNISolone (SOLU-MEDROL) injection  40 mg Intravenous Q12H  . metoprolol succinate  50 mg Oral q morning - 10a  . multivitamin with minerals  1 tablet Oral Daily   Continuous Infusions:  Antibiotics Given (last 72 hours)    Date/Time Action Medication Dose Rate   04/05/15 1457 Given   vancomycin (VANCOCIN) 500 mg in sodium chloride 0.9 % 100 mL IVPB 500 mg 100 mL/hr   04/06/15 0202 Given   ceFEPIme (MAXIPIME) 1 g in dextrose 5 % 50 mL IVPB 1 g 100 mL/hr   04/06/15 0202 Given   vancomycin (VANCOCIN) 500 mg in sodium  chloride 0.9 % 100 mL IVPB 500 mg 100 mL/hr   04/06/15 1512 Given   vancomycin (VANCOCIN) 500 mg in sodium chloride 0.9 % 100 mL IVPB 500 mg 100 mL/hr   04/07/15 0210 Given   ceFEPIme (MAXIPIME) 1 g in dextrose 5 % 50 mL IVPB 1 g 100 mL/hr   04/07/15 0210 Given   vancomycin (VANCOCIN) 500 mg in sodium chloride 0.9 % 100 mL IVPB 500 mg 100 mL/hr   04/07/15 1514 Given   vancomycin (VANCOCIN) 500 mg in sodium chloride 0.9 % 100 mL IVPB 500 mg 100 mL/hr   04/08/15 0315 Given   ceFEPIme (MAXIPIME) 1 g in dextrose 5 % 50 mL IVPB 1 g  100 mL/hr   04/08/15 0346 Given   vancomycin (VANCOCIN) 500 mg in sodium chloride 0.9 % 100 mL IVPB 500 mg 100 mL/hr   04/08/15 1108 Given   doxycycline (VIBRA-TABS) tablet 100 mg 100 mg       Principal Problem:   Acute on chronic respiratory failure with hypoxia (HCC) Active Problems:   GERD (gastroesophageal reflux disease)   COPD exacerbation (HCC)   Stage IV squamous cell carcinoma of left lung (HCC)   Antineoplastic chemotherapy induced anemia   Chemotherapy-induced neuropathy ([place])   Essential (primary) hypertension   Sepsis ([place])   Fever   Malnutrition of moderate degree    Time spent: 25 min    Kelvin Cellar  [facility] Pager [phone]. If 7PM-7AM, please contact night-coverage at [email or web address], password [email or web address] 04/08/2015, 1:19 PM  LOS: 3 days

## 2015-04-08 NOTE — Care Management Important Message (Signed)
Important Message  Patient Details  Name: Tammie Clay MRN: [id number] Date of Birth: 1945/09/02   Medicare Important Message Given:  Yes    Camillo Flaming 04/08/2015, 1:09 [place] Message  Patient Details  Name: Tammie Clay MRN: [id number] Date of Birth: [birth year]   Medicare Important Message Given:  Yes    Camillo Flaming 04/08/2015, 1:09 PM

## 2015-04-09 DIAGNOSIS — I10 Essential (primary) hypertension: Secondary | ICD-10-CM

## 2015-04-09 LAB — CBC
HEMATOCRIT: 29.6 % — AB (ref 36.0–46.0)
HEMOGLOBIN: 9.3 g/dL — AB (ref 12.0–15.0)
MCH: 30.2 pg (ref 26.0–34.0)
MCHC: 31.4 g/dL (ref 30.0–36.0)
MCV: 96.1 fL (ref 78.0–100.0)
Platelets: 171 10*3/uL (ref 150–400)
RBC: 3.08 MIL/uL — AB (ref 3.87–5.11)
RDW: 19.7 % — ABNORMAL HIGH (ref 11.5–15.5)
WBC: 12 10*3/uL — ABNORMAL HIGH (ref 4.0–10.5)

## 2015-04-09 LAB — BASIC METABOLIC PANEL
ANION GAP: 10 (ref 5–15)
BUN: 32 mg/dL — ABNORMAL HIGH (ref 6–20)
CHLORIDE: 101 mmol/L (ref 101–111)
CO2: 29 mmol/L (ref 22–32)
Calcium: 9.1 mg/dL (ref 8.9–10.3)
Creatinine, Ser: 1.02 mg/dL — ABNORMAL HIGH (ref 0.44–1.00)
GFR calc non Af Amer: 54 mL/min — ABNORMAL LOW (ref >60)
Glucose, Bld: 101 mg/dL — ABNORMAL HIGH (ref 65–99)
POTASSIUM: 4.2 mmol/L (ref 3.5–5.1)
Sodium: 140 mmol/L (ref 135–145)

## 2015-04-09 MED ORDER — DOXYCYCLINE HYCLATE 100 MG PO TABS: 100.0000 mg | ORAL_TABLET | Freq: Two times a day (BID) | ORAL | Status: DC

## 2015-04-09 MED ORDER — PREDNISONE 10 MG (21) PO TBPK: ORAL_TABLET | ORAL | Status: DC

## 2015-04-09 NOTE — Progress Notes (Signed)
Discharge instructions given. Pt verbalized understanding and all questions were answered.  

## 2015-04-09 NOTE — Discharge Summary (Signed)
Physician Discharge Summary  Tammie Clay IHK:[id number] DOB: 04/05/1945 DOA: 04/04/2015  PCP: Mayra Neer, MD  Admit date: 04/04/2015 Discharge date: 04/09/2015  Time spent: 35 minutes  Recommendations for Outpatient Follow-up:  1. 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  History of present illness:  Tammie Clay is a 70 y.o. 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  Patient reports that she started having fever last night with temperature 101. She has mild shortness of breath and wheezing, but no cough. No chest pain or tenderness over calf areas. Denies symptoms of UTI. Patient does not have nausea, vomiting, abdominal pain, unilateral weakness.  In ED, patient was found to have WBC 12.2, lactate 1.19-->1.15, temperature 101, tachycardia, tachypnea, electrolytes and renal function okay, chest x-ray showed mass, but no infiltration. Patient is admitted to inpatient for further interventional treatment  Hospital Course:  COPD exacerbation and sepsis due to ? PNA vs flu vs viral illness -Presented with complaints of cough/shortness of breath, treated for COPD exacerbation during this hospitalization with systemic steroids and nebulizer  treatments.  -Work up included Urine S. pneumococcal antigen, Flu pcr negative, Respiratory viral panel negative -She was discharged on 04/09/2015 in stable condition  Acute resp failure -Evidence by patient presented in respiratory distress having an increase in oxygen requirement to 3 L [place] -Likely secondary to COPD exacerbation  Stage IV squamous cell carcinoma of left lung ([place]): Followed up by Dr. [name], on chemotherapy. Last dose was on Wednesday. -Follow-up with Dr. [name]   Discharge Exam: Filed Vitals:   04/09/15 0518 04/09/15 0757  BP: 169/90 151/88  Pulse: 84 99  Temp: 98.1 F (36.7 C) 98.6 F (37 C)  Resp: 20 19     General: She looks better today, asking to go home.   Cardiovascular: rrr  Respiratory: Improved lung exam, having resolution to bilateral expiratory wheezing.   Abdomen: +BS, soft  Musculoskeletal: no edema  Discharge Instructions   Discharge Instructions    Call MD for:  difficulty breathing, headache or visual disturbances    Complete by:  As directed      Call MD for:  extreme fatigue    Complete by:  As directed      Call MD for:  hives    Complete by:  As directed      Call MD for:  persistant dizziness or light-headedness    Complete by:  As directed      Call MD for:  persistant nausea and vomiting    Complete by:  As directed      Call MD for:  redness, tenderness, or signs of infection (pain, swelling, redness, odor or green/yellow discharge around incision site)    Complete by:  As directed      Call MD for:  severe uncontrolled pain  Complete by:  As directed      Call MD for:  temperature >100.4    Complete by:  As directed      Call MD for:    Complete by:  As directed      Diet - low sodium heart healthy    Complete by:  As directed      Increase activity slowly    Complete by:  As directed           Current Discharge Medication List    START taking these medications   Details  doxycycline (VIBRA-TABS) 100 MG  tablet Take 1 tablet (100 mg total) by mouth 2 (two) times daily. Qty: 6 tablet, Refills: 0    predniSONE (STERAPRED UNI-PAK 21 TAB) 10 MG (21) TBPK tablet Take 6-5-4-3-2-1 tablets by mouth daily till gone. Qty: 21 tablet, Refills: 0      CONTINUE these medications which have NOT CHANGED   Details  acetaminophen (TYLENOL) 325 MG tablet Take 650 mg by mouth daily as needed for moderate pain or headache. Take 2 tablets every morning    albuterol (PROVENTIL HFA;VENTOLIN HFA) 108 (90 BASE) MCG/ACT inhaler Inhale 2 puffs into the lungs every 6 (six) hours as needed for wheezing.    calcium carbonate (TUMS - DOSED IN MG ELEMENTAL CALCIUM) 500 MG chewable tablet Chew 1 tablet by mouth daily as needed for indigestion or heartburn.     diphenoxylate-atropine (LOMOTIL) 2.5-0.025 MG tablet TAKE 2 TABLETS 4 TIMES A DAY AS NEEDED FOR DIARRHEA Qty: 30 tablet, Refills: 0    docusate sodium (COLACE) 100 MG capsule Take 100 mg by mouth 2 (two) times daily as needed for moderate constipation.     DULoxetine (CYMBALTA) 30 MG capsule TAKE ONE CAPSULE BY MOUTH EVERY DAY Qty: 30 capsule, Refills: 2    Fexofenadine HCl (ALLEGRA ALLERGY PO) Take 1 tablet by mouth daily.     HYDROcodone-acetaminophen (NORCO) 10-325 MG per tablet Take 1 tablet by mouth every 6 (six) hours as needed for moderate pain or severe pain.     lidocaine-prilocaine (EMLA) cream Apply 1 application topically as needed. Qty: 30 g, Refills: 0    lisinopril-hydrochlorothiazide (PRINZIDE,ZESTORETIC) 20-12.5 MG per tablet Take 2 tablets by mouth every morning.     LORazepam (ATIVAN) 2 MG tablet Take 2 mg by mouth at bedtime as needed. Take 1-2 tabs by mouth at bedtimes as needed for insomnia Refills: 0   Associated Diagnoses: Stage IV squamous cell carcinoma of left lung ([place]); Chemotherapy induced neutropenia ([place]); Encounter for antineoplastic chemotherapy    metoprolol succinate (TOPROL-XL) 50 MG 24 hr tablet Take 50 mg by mouth every  morning. Take with or immediately following a meal.    Multiple Vitamin (MULTIVITAMIN WITH MINERALS) TABS Take 1 tablet by mouth daily.    polyethylene glycol (MIRALAX / GLYCOLAX) packet Take 17 g by mouth daily as needed for moderate constipation or severe constipation.     prochlorperazine (COMPAZINE) 10 MG tablet Take 1 tablet (10 mg total) by mouth every 6 (six) hours as needed for nausea or vomiting. 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     SYMBICORT 160-4.5 MCG/ACT inhaler INHALE 2 PUFFS INTO THE LUNGS TWICE DAILY Refills: 10      STOP taking these medications     loperamide (  IMODIUM) 2 MG capsule        No Known Allergies Follow-up Information    Follow up with SHAW,KIMBERLEE, MD In 2 weeks.   Specialty:  Family Medicine   Contact information:   [address] [place] [address] [phone]        The results of significant diagnostics from this hospitalization (including imaging, microbiology, ancillary and laboratory) are listed below for reference.    Significant Diagnostic Studies: Dg Chest 2 View  04/04/2015  CLINICAL DATA:  70 year old current history of left upper lobe lung cancer, presenting with 2 day history of fever and shortness of breath. Patient's most recent chemotherapy treatment was 4 days ago. EXAM: CHEST  2 VIEW COMPARISON:  CT chest 03/30/2015 and earlier. Two-view chest x-ray 10/01/2014. FINDINGS: Cardiac silhouette upper normal in size, unchanged. Thoracic aorta atherosclerotic, unchanged. Medial left upper lobe lung mass as noted on recent CT is partially obscured by the port on the PA image. Chronic elevation left hemidiaphragm with chronic scar/atelectasis in the left lower lobe, unchanged. The multiple small nodules identified on CT are less apparent on chest  x-ray. Lungs otherwise clear. No pleural effusions. Degenerative changes throughout the thoracic spine. Left subclavian Port-A-Cath tip in the upper SVC. IMPRESSION: Medial left upper lobe lung mass as noted on the recent chest CT. Chronic elevation left hemidiaphragm with chronic scar/atelectasis in the left lower lobe. No acute cardiopulmonary disease. Electronically Signed   By: Evangeline Dakin M.D.   On: 04/04/2015 18:46   Ct Chest W Contrast  03/30/2015  CLINICAL DATA:  Lung cancer diagnosed in October 2015. Chemotherapy in progress. Left anterior chest discomfort for 1 month. Shortness of breath. EXAM: CT CHEST, ABDOMEN, AND PELVIS WITH CONTRAST TECHNIQUE: Multidetector CT imaging of the chest, abdomen and pelvis was performed following the standard protocol during bolus administration of intravenous contrast. CONTRAST:  149m OMNIPAQUE IOHEXOL 300 MG/ML  SOLN COMPARISON:  Multiple exams, including 01/13/2015 FINDINGS: CT CHEST FINDINGS Mediastinum/Nodes: Right lower paratracheal node 1.8 cm in short axis on image 22 series 2, stable. AP window lymph node 1.4 cm in short axis on image 21 of series 2, previously 1.1 cm by my measurement. Subcarinal lymph node 1.9 cm in short axis on image 26 series 2, formerly 2.1 cm. Right hilar node 1.3 cm in short axis on image 26 series 2, formerly 1.1 cm. Paraesophageal node 1.4 cm in short axis on image 31 series 2, previously 1.3 cm. Soft tissue density along the left pericardial margin 6.0 by 3.9 cm, formerly 6.6 by 4.3 cm by my measurement, with some nodularity extending to the pleural surface along the lingula, and possibly with some degree of atelectasis. Coronary, aortic arch, and branch vessel atherosclerotic vascular disease. Lungs/Pleura: Trace left pleural effusion, reduced in size from previous, nonspecific for transudative versus exudative etiology. Emphysema. New 0.4 by 0.5 cm nodule in the right upper lobe on image 11 series 4. Additional new nodules are  present such as the 4 mm nodule in the posterior basal segment right lower lobe. Some of the nodules in the right lung are stable and some are larger than previous. One example of a larger nodule is the 6 by 5 mm right lower lobe nodule on image 41 of series 4 which previously measured 5 by 4 mm. There is stable scarring in the right middle lobe. Generally stable nodules in the left lung. Musculoskeletal: Generally increase in size of the lytic lesion in the left sixth rib laterally,  for example comparing image 30 of series 4 to image 26 of series 4, with the lesion measuring 3 cm along the length of the rib is post prior measurement of 1.8 cm, and potentially with increased soft tissue component extending into the adjacent fifth intercostal space. There is some new early left fifth rib erosion due to the intercostal portion of this mass which the was not previously readily apparent. Small foci of sclerosis in the sternal body appear stable. Small sclerotic lesion in the neck of the right scapula, stable. CT ABDOMEN PELVIS FINDINGS Hepatobiliary: There are multiple hypodense lesions in the liver. These are for the most part stable, aside from a new 0.5 by 0.3 cm lesion in segment 7 on image 48 series 2. Hypodense lesion in segment 4 of the liver on image 53 series 2 measures 7 mm in diameter, previously 5 mm but at times in the past this has measured up to 7 mm. Pancreas: Unremarkable Spleen: Unremarkable Adrenals/Urinary Tract: Non rotated right kidney several tiny hypodense renal lesions are technically nonspecific although statistically likely to be cysts in the unchanged. Stomach/Bowel: Mild wall thickening in the stomach antrum. Prominent stool throughout the colon favors constipation. Mild sigmoid diverticulosis. Vascular/Lymphatic: Aortoiliac atherosclerotic vascular disease. Infrarenal abdominal aortic aneurysm, 3.1 by 2.8 cm, with mural thrombus observed. Reproductive: Suspected anterior uterine fibroid  given the deviation of the thin endometrium posteriorly. 4.5 by 3.6 cm cystic lesion of the left ovary, fluid density. Other: No supplemental non-categorized findings. Musculoskeletal: Chronic left iliopsoas lipoma. Lumbar spondylosis and degenerative disc disease with mild grade 1 retrolisthesis at L1-2. IMPRESSION: 1. Mild progression of disease with some new and mildly enlarging pulmonary nodules, enlarging lytic lesion of the left sixth rib with some extension into the fifth intercostal space and new early erosion of the left fifth rib, and generally stable pathologic thoracic adenopathy. 2. New 5 mm hypodense lesion in posteriorly in segment 7 along the dome of the liver merits observation 3. Small infrarenal abdominal aortic aneurysm. Recommend followup by ultrasound in 3 years. This recommendation follows ACR consensus guidelines: White Paper of the ACR Incidental Findings Committee II on Vascular Findings. J Am Coll Radiol 2013; 16:109-604 4. Other imaging findings of potential clinical significance: Coronary, aortic arch, and branch vessel atherosclerotic vascular disease. Aortoiliac atherosclerotic vascular disease. Emphysema. Mild wall thickening in the stomach antrum, gastritis not excluded. Prominent stool throughout the colon favors constipation. Mild sigmoid diverticulosis. Suspected anterior uterine fibroid. Electronically Signed   By: Van Clines M.D.   On: 03/30/2015 08:16   Ct Abdomen Pelvis W Contrast  03/30/2015  CLINICAL DATA:  Lung cancer diagnosed in October 2015. Chemotherapy in progress. Left anterior chest discomfort for 1 month. Shortness of breath. EXAM: CT CHEST, ABDOMEN, AND PELVIS WITH CONTRAST TECHNIQUE: Multidetector CT imaging of the chest, abdomen and pelvis was performed following the standard protocol during bolus administration of intravenous contrast. CONTRAST:  154m OMNIPAQUE IOHEXOL 300 MG/ML  SOLN COMPARISON:  Multiple exams, including 01/13/2015 FINDINGS: CT  CHEST FINDINGS Mediastinum/Nodes: Right lower paratracheal node 1.8 cm in short axis on image 22 series 2, stable. AP window lymph node 1.4 cm in short axis on image 21 of series 2, previously 1.1 cm by my measurement. Subcarinal lymph node 1.9 cm in short axis on image 26 series 2, formerly 2.1 cm. Right hilar node 1.3 cm in short axis on image 26 series 2, formerly 1.1 cm. Paraesophageal node 1.4 cm in short axis on image 31 series 2, previously 1.3  cm. Soft tissue density along the left pericardial margin 6.0 by 3.9 cm, formerly 6.6 by 4.3 cm by my measurement, with some nodularity extending to the pleural surface along the lingula, and possibly with some degree of atelectasis. Coronary, aortic arch, and branch vessel atherosclerotic vascular disease. Lungs/Pleura: Trace left pleural effusion, reduced in size from previous, nonspecific for transudative versus exudative etiology. Emphysema. New 0.4 by 0.5 cm nodule in the right upper lobe on image 11 series 4. Additional new nodules are present such as the 4 mm nodule in the posterior basal segment right lower lobe. Some of the nodules in the right lung are stable and some are larger than previous. One example of a larger nodule is the 6 by 5 mm right lower lobe nodule on image 41 of series 4 which previously measured 5 by 4 mm. There is stable scarring in the right middle lobe. Generally stable nodules in the left lung. Musculoskeletal: Generally increase in size of the lytic lesion in the left sixth rib laterally, for example comparing image 30 of series 4 to image 26 of series 4, with the lesion measuring 3 cm along the length of the rib is post prior measurement of 1.8 cm, and potentially with increased soft tissue component extending into the adjacent fifth intercostal space. There is some new early left fifth rib erosion due to the intercostal portion of this mass which the was not previously readily apparent. Small foci of sclerosis in the sternal body  appear stable. Small sclerotic lesion in the neck of the right scapula, stable. CT ABDOMEN PELVIS FINDINGS Hepatobiliary: There are multiple hypodense lesions in the liver. These are for the most part stable, aside from a new 0.5 by 0.3 cm lesion in segment 7 on image 48 series 2. Hypodense lesion in segment 4 of the liver on image 53 series 2 measures 7 mm in diameter, previously 5 mm but at times in the past this has measured up to 7 mm. Pancreas: Unremarkable Spleen: Unremarkable Adrenals/Urinary Tract: Non rotated right kidney several tiny hypodense renal lesions are technically nonspecific although statistically likely to be cysts in the unchanged. Stomach/Bowel: Mild wall thickening in the stomach antrum. Prominent stool throughout the colon favors constipation. Mild sigmoid diverticulosis. Vascular/Lymphatic: Aortoiliac atherosclerotic vascular disease. Infrarenal abdominal aortic aneurysm, 3.1 by 2.8 cm, with mural thrombus observed. Reproductive: Suspected anterior uterine fibroid given the deviation of the thin endometrium posteriorly. 4.5 by 3.6 cm cystic lesion of the left ovary, fluid density. Other: No supplemental non-categorized findings. Musculoskeletal: Chronic left iliopsoas lipoma. Lumbar spondylosis and degenerative disc disease with mild grade 1 retrolisthesis at L1-2. IMPRESSION: 1. Mild progression of disease with some new and mildly enlarging pulmonary nodules, enlarging lytic lesion of the left sixth rib with some extension into the fifth intercostal space and new early erosion of the left fifth rib, and generally stable pathologic thoracic adenopathy. 2. New 5 mm hypodense lesion in posteriorly in segment 7 along the dome of the liver merits observation 3. Small infrarenal abdominal aortic aneurysm. Recommend followup by ultrasound in 3 years. This recommendation follows ACR consensus guidelines: White Paper of the ACR Incidental Findings Committee II on Vascular Findings. J Am Coll Radiol  2013; 28:366-294 4. Other imaging findings of potential clinical significance: Coronary, aortic arch, and branch vessel atherosclerotic vascular disease. Aortoiliac atherosclerotic vascular disease. Emphysema. Mild wall thickening in the stomach antrum, gastritis not excluded. Prominent stool throughout the colon favors constipation. Mild sigmoid diverticulosis. Suspected anterior uterine fibroid. Electronically Signed  By: Van Clines M.D.   On: 03/30/2015 08:16    Microbiology: Recent Results (from the past 240 hour(s))  Culture, blood (routine x 2)     Status: None (Preliminary result)   Collection Time: 04/04/15  9:37 PM  Result Value Ref Range Status   Specimen Description BLOOD LEFT ANTECUBITAL  Final   Special Requests BOTTLES DRAWN AEROBIC AND ANAEROBIC 10ML  Final   Culture   Final    NO GROWTH 3 DAYS Performed at [facility]    Report Status PENDING  Incomplete  Culture, blood (routine x 2)     Status: None (Preliminary result)   Collection Time: 04/04/15  9:40 PM  Result Value Ref Range Status   Specimen Description BLOOD LEFT ANTECUBITAL  Final   Special Requests BOTTLES DRAWN AEROBIC ONLY 10CC  Final   Culture   Final    NO GROWTH 3 DAYS Performed at [facility]    Report Status PENDING  Incomplete  Urine culture     Status: None   Collection Time: 04/05/15 12:41 AM  Result Value Ref Range Status   Specimen Description URINE, CLEAN CATCH  Final   Special Requests NONE  Final   Culture   Final    MULTIPLE SPECIES PRESENT, SUGGEST RECOLLECTION Performed at [facility]    Report Status 04/06/2015 FINAL  Final  Respiratory virus panel     Status: None   Collection Time: 04/05/15  1:12 PM  Result Value Ref Range Status   Respiratory Syncytial Virus A Negative Negative Final   Respiratory Syncytial Virus B Negative Negative Final   Influenza A Negative Negative Final   Influenza B Negative Negative Final   Parainfluenza 1 Negative  Negative Final   Parainfluenza 2 Negative Negative Final   Parainfluenza 3 Negative Negative Final   Metapneumovirus Negative Negative Final   Rhinovirus Negative Negative Final   Adenovirus Negative Negative Final    Comment: (NOTE) Performed At: [facility] [place], [place] [id number] Lindon Romp MD RC:[id number]      Labs: Basic Metabolic Panel:  Recent Labs Lab 04/04/15 1953 04/06/15 0530 04/07/15 0455 04/09/15 0526  NA 136 140 141 140  K 3.7 3.9 4.1 4.2  CL 97* 102 102 101  CO2 '28 27 30 29  '$ GLUCOSE 115* 137* 115* 101*  BUN 24* 21* 26* 32*  CREATININE 1.00 0.99 0.92 1.02*  CALCIUM 8.8* 9.1 9.3 9.1   Liver Function Tests:  Recent Labs Lab 04/04/15 1953  AST 22  ALT 15  ALKPHOS 74  BILITOT 0.9  PROT 7.0  ALBUMIN 4.0   No results for input(s): LIPASE, AMYLASE in the last 168 hours. No results for input(s): AMMONIA in the last 168 hours. CBC:  Recent Labs Lab 04/04/15 1953 04/06/15 0530 04/07/15 0455 04/09/15 0526  WBC 12.2* 8.9 9.4 12.0*  NEUTROABS 11.7*  --   --   --   HGB 9.0* 8.7* 9.0* 9.3*  HCT 27.4* 27.3* 28.5* 29.6*  MCV 92.9 95.1 94.7 96.1  PLT 243 213 205 171   Cardiac Enzymes: No results for input(s): CKTOTAL, CKMB, CKMBINDEX, TROPONINI in the last 168 hours. BNP: BNP (last 3 results) No results for input(s): BNP in the last 8760 hours.  ProBNP (last 3 results) No results for input(s): PROBNP in the last 8760 hours.  CBG: No results for input(s): GLUCAP in the last 168 hours.     Signed:  Kelvin Cellar MD.  [facility]  04/09/2015, 9:41 AM

## 2015-04-09 NOTE — Progress Notes (Signed)
[facility] came through for patient to receive oxygen. Oxygen sats were not performed nor documented to confirmed need or to qualify her.     Linward Headland 04/09/2015, 2:47 PM

## 2015-04-10 LAB — CULTURE, BLOOD (ROUTINE X 2)
CULTURE: NO GROWTH
CULTURE: NO GROWTH

## 2015-04-14 ENCOUNTER — Ambulatory Visit
Admission: RE | Admit: 2015-04-14 | Discharge: 2015-04-14 | Disposition: A | Discharge status: Home / Self Care | Payer: Medicare Other | Source: Ambulatory Visit | Attending: Radiation Oncology | Admitting: Radiation Oncology

## 2015-04-14 DIAGNOSIS — M8580 Other specified disorders of bone density and structure, unspecified site: Secondary | ICD-10-CM | POA: Diagnosis not present

## 2015-04-14 DIAGNOSIS — I2510 Atherosclerotic heart disease of native coronary artery without angina pectoris: Secondary | ICD-10-CM | POA: Diagnosis not present

## 2015-04-14 DIAGNOSIS — C3492 Malignant neoplasm of unspecified part of left bronchus or lung: Secondary | ICD-10-CM | POA: Diagnosis present

## 2015-04-14 DIAGNOSIS — F419 Anxiety disorder, unspecified: Secondary | ICD-10-CM | POA: Diagnosis not present

## 2015-04-14 DIAGNOSIS — C7951 Secondary malignant neoplasm of bone: Secondary | ICD-10-CM | POA: Diagnosis present

## 2015-04-14 DIAGNOSIS — I10 Essential (primary) hypertension: Secondary | ICD-10-CM | POA: Diagnosis not present

## 2015-04-14 DIAGNOSIS — F1010 Alcohol abuse, uncomplicated: Secondary | ICD-10-CM | POA: Diagnosis not present

## 2015-04-14 DIAGNOSIS — D701 Agranulocytosis secondary to cancer chemotherapy: Secondary | ICD-10-CM | POA: Diagnosis not present

## 2015-04-14 DIAGNOSIS — Z510 Encounter for antineoplastic radiation therapy: Secondary | ICD-10-CM | POA: Diagnosis present

## 2015-04-14 DIAGNOSIS — E785 Hyperlipidemia, unspecified: Secondary | ICD-10-CM | POA: Diagnosis not present

## 2015-04-14 DIAGNOSIS — K219 Gastro-esophageal reflux disease without esophagitis: Secondary | ICD-10-CM | POA: Diagnosis not present

## 2015-04-14 DIAGNOSIS — Z87891 Personal history of nicotine dependence: Secondary | ICD-10-CM | POA: Diagnosis not present

## 2015-04-14 DIAGNOSIS — J449 Chronic obstructive pulmonary disease, unspecified: Secondary | ICD-10-CM | POA: Diagnosis not present

## 2015-04-14 DIAGNOSIS — R590 Localized enlarged lymph nodes: Secondary | ICD-10-CM | POA: Diagnosis not present

## 2015-04-14 NOTE — Progress Notes (Signed)
  Radiation Oncology         [phone] ________________________________  Name: Tammie Clay MRN: [id number]  Date: 04/14/2015  DOB: 1945/12/05  SIMULATION AND TREATMENT PLANNING NOTE    ICD-9-CM ICD-10-CM   1. 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 Secondary malignant neoplasm of bone (HCC) 198.5 C79.51     DIAGNOSIS:  Stage IV (T2, N2, M1a) non-small cell lung cancer With symptomatic left rib metastasis  NARRATIVE:  The patient was brought to the [facility].  Identity was confirmed.  All relevant records and images related to the planned course of therapy were reviewed.  The patient freely provided informed written consent to proceed with treatment after reviewing the details related to the planned course of therapy. The consent form was witnessed and verified by the simulation staff.  Then, the patient was set-up in a stable reproducible  supine position for radiation therapy.  CT images were obtained.  Surface markings were placed.  The CT images were loaded into the planning software.  Then the target and avoidance structures were contoured.  Treatment planning then occurred.  The radiation prescription was entered and confirmed.  Then, I designed and supervised the construction of a total of 4 medically necessary complex treatment devices.  I have requested : 3D Simulation  I have requested a DVH of the following structures: heart, lungs, GTV/PTV.  I have ordered:dose calc.  PLAN:  The patient will receive 30 Gy in 10 fractions.  ________________________________  -----------------------------------  Blair Promise, PhD, MD

## 2015-04-15 ENCOUNTER — Other Ambulatory Visit (HOSPITAL_BASED_OUTPATIENT_CLINIC_OR_DEPARTMENT_OTHER): Payer: Medicare Other

## 2015-04-15 ENCOUNTER — Telehealth: Payer: Self-pay | Admitting: *Deleted

## 2015-04-15 DIAGNOSIS — C3492 Malignant neoplasm of unspecified part of left bronchus or lung: Secondary | ICD-10-CM

## 2015-04-15 DIAGNOSIS — C3412 Malignant neoplasm of upper lobe, left bronchus or lung: Secondary | ICD-10-CM | POA: Diagnosis not present

## 2015-04-15 LAB — COMPREHENSIVE METABOLIC PANEL
ALBUMIN: 3.7 g/dL (ref 3.5–5.0)
ALT: 16 U/L (ref 0–55)
AST: 13 U/L (ref 5–34)
Alkaline Phosphatase: 86 U/L (ref 40–150)
Anion Gap: 12 mEq/L — ABNORMAL HIGH (ref 3–11)
BILIRUBIN TOTAL: 0.45 mg/dL (ref 0.20–1.20)
BUN: 40.4 mg/dL — ABNORMAL HIGH (ref 7.0–26.0)
CO2: 28 meq/L (ref 22–29)
CREATININE: 1.2 mg/dL — AB (ref 0.6–1.1)
Calcium: 9.2 mg/dL (ref 8.4–10.4)
Chloride: 97 mEq/L — ABNORMAL LOW (ref 98–109)
EGFR: 44 mL/min/{1.73_m2} — ABNORMAL LOW (ref >90)
GLUCOSE: 129 mg/dL (ref 70–140)
Potassium: 4.5 mEq/L (ref 3.5–5.1)
SODIUM: 137 meq/L (ref 136–145)
Total Protein: 6.9 g/dL (ref 6.4–8.3)

## 2015-04-15 LAB — CBC WITH DIFFERENTIAL/PLATELET
BASO%: 0.1 % (ref 0.0–2.0)
BASOS ABS: 0 10*3/uL (ref 0.0–0.1)
EOS ABS: 0 10*3/uL (ref 0.0–0.5)
EOS%: 0.1 % (ref 0.0–7.0)
HCT: 34 % — ABNORMAL LOW (ref 34.8–46.6)
HEMOGLOBIN: 10.9 g/dL — AB (ref 11.6–15.9)
LYMPH%: 7.5 % — AB (ref 14.0–49.7)
MCH: 31 pg (ref 25.1–34.0)
MCHC: 32.1 g/dL (ref 31.5–36.0)
MCV: 96.6 fL (ref 79.5–101.0)
MONO#: 0.8 10*3/uL (ref 0.1–0.9)
MONO%: 4.3 % (ref 0.0–14.0)
NEUT#: 16.7 10*3/uL — ABNORMAL HIGH (ref 1.5–6.5)
NEUT%: 88 % — AB (ref 38.4–76.8)
Platelets: 144 10*3/uL — ABNORMAL LOW (ref 145–400)
RBC: 3.52 10*6/uL — ABNORMAL LOW (ref 3.70–5.45)
RDW: 22.2 % — AB (ref 11.2–14.5)
WBC: 19 10*3/uL — ABNORMAL HIGH (ref 3.9–10.3)
lymph#: 1.4 10*3/uL (ref 0.9–3.3)

## 2015-04-15 LAB — TECHNOLOGIST REVIEW

## 2015-04-15 NOTE — Telephone Encounter (Signed)
Pt called to inform MD she has a wedding the weekend of 3/15 and does not want chemo on 3/15 .  Pt is asking how this missed treatment and 3/1 missed treatment will affect her. Discussed with pt how many times there is a break in treatment and pt are able to pick back up at the next scheduled appt. Informed pt I will review with MD upon his return tomorrow and call her back with additional information. Pt verbalized understanding and advised it would be best if she could skip 3/15 (treatment and resume on 3/22  [facility])

## 2015-04-16 DIAGNOSIS — Z510 Encounter for antineoplastic radiation therapy: Secondary | ICD-10-CM | POA: Diagnosis not present

## 2015-04-20 ENCOUNTER — Encounter: Payer: Self-pay | Admitting: *Deleted

## 2015-04-20 ENCOUNTER — Ambulatory Visit
Admission: RE | Admit: 2015-04-20 | Discharge: 2015-04-20 | Disposition: A | Discharge status: Home / Self Care | Payer: Medicare Other | Source: Ambulatory Visit | Attending: Radiation Oncology | Admitting: Radiation Oncology

## 2015-04-20 ENCOUNTER — Telehealth: Payer: Self-pay

## 2015-04-20 ENCOUNTER — Telehealth: Payer: Self-pay | Admitting: Medical Oncology

## 2015-04-20 DIAGNOSIS — C7951 Secondary malignant neoplasm of bone: Secondary | ICD-10-CM

## 2015-04-20 DIAGNOSIS — Z510 Encounter for antineoplastic radiation therapy: Secondary | ICD-10-CM | POA: Diagnosis not present

## 2015-04-20 MED ORDER — SONAFINE EX EMUL
1.0000 "application " | Freq: Once | CUTANEOUS | Status: AC
  Administered 2015-04-20: 1 via TOPICAL
  Filled 2015-04-20: qty 45

## 2015-04-20 NOTE — Progress Notes (Signed)
Pt here for patient teaching.  Pt given Radiation and You booklet and Sonafine. Pt reports they have not watched the Radiation Therapy Education video and has been given the link to watch at home.  Reviewed areas of pertinence such as fatigue and skin changes . Pt able to give teach back of to pat skin and use unscented/gentle soap,apply Sonafine bid and avoid applying anything to skin within 4 hours of treatment. Pt demonstrated understanding and verbalizes understanding of information given and will contact nursing with any questions or concerns.     [email or web address]/whattoexpect/index

## 2015-04-20 NOTE — Addendum Note (Signed)
Encounter addended by: Neysa Hotter, [facility] on: 04/20/2015  9:45 AM<BR>     Documentation filed: Rx Order Verification

## 2015-04-20 NOTE — Telephone Encounter (Signed)
Patient called regarding her infusion appt on Wednesday - she wanted to speak to Shauna Hugh or [place].  Call transferred to Dr. Worthy Flank nurses.

## 2015-04-20 NOTE — Telephone Encounter (Signed)
I left a message the per [name] it is okay for her to skip a treatment for a wedding and pick it back up the week after.

## 2015-04-20 NOTE — Progress Notes (Signed)
Tammie Clay Psychosocial Distress Screening Clinical Social Work  Clinical Social Work was referred by distress screening protocol.  The patient scored a 5 on the Psychosocial Distress Thermometer which indicates moderate distress. Clinical Social Worker contacted patient by phone to assess for distress and other psychosocial needs. Tammie Clay expressed feeling nervous that "this week won't go well".  She was eager to "get through the week" because her granddaughter is getting married this Saturday.   The patient reported her significant other, Tammie Clay, is her "rock" and primary support. She indicated no needs or concerns at this time.  CSW encouraged patient to call with any questions or concerns.  ONCBCN DISTRESS SCREENING 04/02/2015  Screening Type Initial Screening  Distress experienced in past week (1-10) 5  Emotional problem type Adjusting to illness  Physical Problem type Pain;Breathing;Tingling hands/feet    Tammie Clay, MSW, LCSW, OSW-C Clinical Social Worker [facility] [phone]

## 2015-04-21 ENCOUNTER — Ambulatory Visit
Admission: RE | Admit: 2015-04-21 | Discharge: 2015-04-21 | Disposition: A | Discharge status: Home / Self Care | Payer: Medicare Other | Source: Ambulatory Visit | Attending: Radiation Oncology | Admitting: Radiation Oncology

## 2015-04-21 ENCOUNTER — Encounter: Payer: Self-pay | Admitting: Radiation Oncology

## 2015-04-21 VITALS — BP 115/68 | HR 114 | Temp 98.4°F | Wt 139.6 lb

## 2015-04-21 DIAGNOSIS — Z510 Encounter for antineoplastic radiation therapy: Secondary | ICD-10-CM | POA: Diagnosis not present

## 2015-04-21 DIAGNOSIS — C3492 Malignant neoplasm of unspecified part of left bronchus or lung: Secondary | ICD-10-CM

## 2015-04-21 NOTE — Progress Notes (Signed)
  Radiation Oncology         (336) [phone] ________________________________  Name: Tammie Clay MRN: [id number]  Date: 04/21/2015  DOB: 1945-03-11  Weekly Radiation Therapy Management    ICD-9-CM ICD-10-CM   1. 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 . . . . . . . The patient presents for routine under treatment assessment.                                   She reports pain a 6/10 in her Left Rib. She will occasionally take Norco for this pain, but states it does not help much. She states she sees her PCP at 10:00 today and plans to talk with her about her pain, not sleeping well, and her lack of energy.                                  Set-up films were reviewed.                                 The chart was checked. Physical Findings. . .  weight is 139 lb 9.6 oz (63.322 kg). Her temperature is 98.4 F (36.9 C). Her blood pressure is 115/68 and her pulse is 114. Her oxygen saturation is 98%. . The lungs are clear. The heart has a regular rhythm with a slightly increased rate Impression . . . . . . . The patient is tolerating radiation. Plan . . . . . . . . . . . . Continue treatment as planned.  ________________________________   Blair Promise, PhD, MD

## 2015-04-21 NOTE — Progress Notes (Signed)
Ms. Berti is here for her 2 fraction of radiation to her Left Rib. She reports pain a 6/10 in her Left Rib. She will occasionally take Norco for this pain, but states it does not help much. She states she sees her PCP at 10:00 today and plans to talk with him about her pain, not sleeping well, and her lack of energy. She states she should be eating better, but she is not. She states nothing tastes good to her.   BP 115/68 mmHg  Pulse 114  Temp(Src) 98.4 F (36.9 C)  Wt 139 lb 9.6 oz (63.322 kg)  SpO2 98%   Wt Readings from Last 3 Encounters:  04/21/15 139 lb 9.6 oz (63.322 kg)  04/02/15 145 lb 3.2 oz (65.862 kg)  03/31/15 142 lb 6.4 oz (64.592 kg)

## 2015-04-22 ENCOUNTER — Other Ambulatory Visit: Payer: Medicare Other

## 2015-04-22 ENCOUNTER — Ambulatory Visit
Admission: RE | Admit: 2015-04-22 | Discharge: 2015-04-22 | Disposition: A | Discharge status: Home / Self Care | Payer: Medicare Other | Source: Ambulatory Visit | Attending: Radiation Oncology | Admitting: Radiation Oncology

## 2015-04-22 ENCOUNTER — Ambulatory Visit: Payer: Medicare Other

## 2015-04-22 DIAGNOSIS — Z510 Encounter for antineoplastic radiation therapy: Secondary | ICD-10-CM | POA: Diagnosis not present

## 2015-04-23 ENCOUNTER — Ambulatory Visit
Admission: RE | Admit: 2015-04-23 | Discharge: 2015-04-23 | Disposition: A | Discharge status: Home / Self Care | Payer: Medicare Other | Source: Ambulatory Visit | Attending: Radiation Oncology | Admitting: Radiation Oncology

## 2015-04-23 DIAGNOSIS — Z510 Encounter for antineoplastic radiation therapy: Secondary | ICD-10-CM | POA: Diagnosis not present

## 2015-04-24 ENCOUNTER — Ambulatory Visit
Admission: RE | Admit: 2015-04-24 | Discharge: 2015-04-24 | Disposition: A | Discharge status: Home / Self Care | Payer: Medicare Other | Source: Ambulatory Visit | Attending: Radiation Oncology | Admitting: Radiation Oncology

## 2015-04-24 DIAGNOSIS — Z510 Encounter for antineoplastic radiation therapy: Secondary | ICD-10-CM | POA: Diagnosis not present

## 2015-04-27 ENCOUNTER — Ambulatory Visit
Admission: RE | Admit: 2015-04-27 | Discharge: 2015-04-27 | Disposition: A | Discharge status: Home / Self Care | Payer: Medicare Other | Source: Ambulatory Visit | Attending: Radiation Oncology | Admitting: Radiation Oncology

## 2015-04-27 DIAGNOSIS — Z510 Encounter for antineoplastic radiation therapy: Secondary | ICD-10-CM | POA: Diagnosis not present

## 2015-04-28 ENCOUNTER — Encounter (HOSPITAL_COMMUNITY): Payer: Self-pay

## 2015-04-28 ENCOUNTER — Telehealth: Payer: Self-pay | Admitting: Medical Oncology

## 2015-04-28 ENCOUNTER — Inpatient Hospital Stay (HOSPITAL_COMMUNITY)
Admission: EM | Admit: 2015-04-28 | Discharge: 2015-05-01 | DRG: 175 | Disposition: A | Discharge status: Home / Self Care | Payer: Medicare Other | Attending: Internal Medicine | Admitting: Internal Medicine

## 2015-04-28 ENCOUNTER — Ambulatory Visit: Payer: Medicare Other | Admitting: Radiation Oncology

## 2015-04-28 ENCOUNTER — Emergency Department (HOSPITAL_COMMUNITY): Payer: Medicare Other

## 2015-04-28 ENCOUNTER — Ambulatory Visit: Payer: Medicare Other

## 2015-04-28 ENCOUNTER — Ambulatory Visit
Admit: 2015-04-28 | Discharge: 2015-04-28 | Disposition: A | Discharge status: Home / Self Care | Payer: Medicare Other | Attending: Radiation Oncology | Admitting: Radiation Oncology

## 2015-04-28 DIAGNOSIS — C7951 Secondary malignant neoplasm of bone: Secondary | ICD-10-CM | POA: Diagnosis present

## 2015-04-28 DIAGNOSIS — J9621 Acute and chronic respiratory failure with hypoxia: Secondary | ICD-10-CM | POA: Diagnosis not present

## 2015-04-28 DIAGNOSIS — E785 Hyperlipidemia, unspecified: Secondary | ICD-10-CM | POA: Diagnosis present

## 2015-04-28 DIAGNOSIS — Z6825 Body mass index (BMI) 25.0-25.9, adult: Secondary | ICD-10-CM | POA: Diagnosis not present

## 2015-04-28 DIAGNOSIS — R740 Nonspecific elevation of levels of transaminase and lactic acid dehydrogenase [LDH]: Secondary | ICD-10-CM | POA: Diagnosis present

## 2015-04-28 DIAGNOSIS — Z8249 Family history of ischemic heart disease and other diseases of the circulatory system: Secondary | ICD-10-CM

## 2015-04-28 DIAGNOSIS — I82441 Acute embolism and thrombosis of right tibial vein: Secondary | ICD-10-CM | POA: Diagnosis present

## 2015-04-28 DIAGNOSIS — D649 Anemia, unspecified: Secondary | ICD-10-CM | POA: Diagnosis present

## 2015-04-28 DIAGNOSIS — R0600 Dyspnea, unspecified: Secondary | ICD-10-CM | POA: Insufficient documentation

## 2015-04-28 DIAGNOSIS — I2510 Atherosclerotic heart disease of native coronary artery without angina pectoris: Secondary | ICD-10-CM | POA: Diagnosis present

## 2015-04-28 DIAGNOSIS — E440 Moderate protein-calorie malnutrition: Secondary | ICD-10-CM | POA: Diagnosis present

## 2015-04-28 DIAGNOSIS — M8580 Other specified disorders of bone density and structure, unspecified site: Secondary | ICD-10-CM | POA: Diagnosis present

## 2015-04-28 DIAGNOSIS — I10 Essential (primary) hypertension: Secondary | ICD-10-CM | POA: Diagnosis present

## 2015-04-28 DIAGNOSIS — Z823 Family history of stroke: Secondary | ICD-10-CM

## 2015-04-28 DIAGNOSIS — C3492 Malignant neoplasm of unspecified part of left bronchus or lung: Secondary | ICD-10-CM | POA: Diagnosis present

## 2015-04-28 DIAGNOSIS — D696 Thrombocytopenia, unspecified: Secondary | ICD-10-CM | POA: Diagnosis present

## 2015-04-28 DIAGNOSIS — I82409 Acute embolism and thrombosis of unspecified deep veins of unspecified lower extremity: Secondary | ICD-10-CM | POA: Diagnosis present

## 2015-04-28 DIAGNOSIS — F419 Anxiety disorder, unspecified: Secondary | ICD-10-CM | POA: Diagnosis present

## 2015-04-28 DIAGNOSIS — Z7952 Long term (current) use of systemic steroids: Secondary | ICD-10-CM

## 2015-04-28 DIAGNOSIS — F329 Major depressive disorder, single episode, unspecified: Secondary | ICD-10-CM | POA: Diagnosis present

## 2015-04-28 DIAGNOSIS — Z7189 Other specified counseling: Secondary | ICD-10-CM | POA: Insufficient documentation

## 2015-04-28 DIAGNOSIS — G8929 Other chronic pain: Secondary | ICD-10-CM | POA: Diagnosis present

## 2015-04-28 DIAGNOSIS — R7401 Elevation of levels of liver transaminase levels: Secondary | ICD-10-CM | POA: Diagnosis present

## 2015-04-28 DIAGNOSIS — J441 Chronic obstructive pulmonary disease with (acute) exacerbation: Secondary | ICD-10-CM | POA: Diagnosis present

## 2015-04-28 DIAGNOSIS — I2699 Other pulmonary embolism without acute cor pulmonale: Principal | ICD-10-CM | POA: Diagnosis present

## 2015-04-28 DIAGNOSIS — Z79899 Other long term (current) drug therapy: Secondary | ICD-10-CM | POA: Diagnosis not present

## 2015-04-28 DIAGNOSIS — R0602 Shortness of breath: Secondary | ICD-10-CM | POA: Diagnosis present

## 2015-04-28 DIAGNOSIS — Z87891 Personal history of nicotine dependence: Secondary | ICD-10-CM | POA: Diagnosis not present

## 2015-04-28 DIAGNOSIS — K219 Gastro-esophageal reflux disease without esophagitis: Secondary | ICD-10-CM | POA: Diagnosis present

## 2015-04-28 DIAGNOSIS — R609 Edema, unspecified: Secondary | ICD-10-CM

## 2015-04-28 DIAGNOSIS — J449 Chronic obstructive pulmonary disease, unspecified: Secondary | ICD-10-CM | POA: Diagnosis not present

## 2015-04-28 DIAGNOSIS — Z515 Encounter for palliative care: Secondary | ICD-10-CM | POA: Diagnosis present

## 2015-04-28 DIAGNOSIS — Z66 Do not resuscitate: Secondary | ICD-10-CM | POA: Diagnosis present

## 2015-04-28 DIAGNOSIS — Z833 Family history of diabetes mellitus: Secondary | ICD-10-CM | POA: Diagnosis not present

## 2015-04-28 LAB — COMPREHENSIVE METABOLIC PANEL
ALT: 105 U/L — AB (ref 14–54)
ANION GAP: 14 (ref 5–15)
AST: 107 U/L — AB (ref 15–41)
Albumin: 3.6 g/dL (ref 3.5–5.0)
Alkaline Phosphatase: 95 U/L (ref 38–126)
BUN: 37 mg/dL — ABNORMAL HIGH (ref 6–20)
CHLORIDE: 92 mmol/L — AB (ref 101–111)
CO2: 26 mmol/L (ref 22–32)
Calcium: 9.1 mg/dL (ref 8.9–10.3)
Creatinine, Ser: 1.23 mg/dL — ABNORMAL HIGH (ref 0.44–1.00)
GFR calc Af Amer: 50 mL/min — ABNORMAL LOW (ref >60)
GFR, EST NON AFRICAN AMERICAN: 43 mL/min — AB (ref >60)
Glucose, Bld: 126 mg/dL — ABNORMAL HIGH (ref 65–99)
POTASSIUM: 4.5 mmol/L (ref 3.5–5.1)
SODIUM: 132 mmol/L — AB (ref 135–145)
TOTAL PROTEIN: 6.9 g/dL (ref 6.5–8.1)
Total Bilirubin: 0.7 mg/dL (ref 0.3–1.2)

## 2015-04-28 LAB — CBC WITH DIFFERENTIAL/PLATELET
BASOS ABS: 0 10*3/uL (ref 0.0–0.1)
Basophils Relative: 0 %
Eosinophils Absolute: 0 10*3/uL (ref 0.0–0.7)
Eosinophils Relative: 0 %
HEMATOCRIT: 26.5 % — AB (ref 36.0–46.0)
HEMOGLOBIN: 8.5 g/dL — AB (ref 12.0–15.0)
LYMPHS PCT: 9 %
Lymphs Abs: 0.7 10*3/uL (ref 0.7–4.0)
MCH: 31.5 pg (ref 26.0–34.0)
MCHC: 32.1 g/dL (ref 30.0–36.0)
MCV: 98.1 fL (ref 78.0–100.0)
MONOS PCT: 9 %
Monocytes Absolute: 0.7 10*3/uL (ref 0.1–1.0)
Neutro Abs: 6.1 10*3/uL (ref 1.7–7.7)
Neutrophils Relative %: 82 %
Platelets: 131 10*3/uL — ABNORMAL LOW (ref 150–400)
RBC: 2.7 MIL/uL — AB (ref 3.87–5.11)
RDW: 20.7 % — ABNORMAL HIGH (ref 11.5–15.5)
WBC: 7.5 10*3/uL (ref 4.0–10.5)

## 2015-04-28 MED ORDER — METOPROLOL SUCCINATE ER 50 MG PO TB24
50.0000 mg | ORAL_TABLET | Freq: Every morning | ORAL | Status: DC
  Administered 2015-04-29 – 2015-05-01 (×3): 50 mg via ORAL
  Filled 2015-04-28 (×3): qty 1

## 2015-04-28 MED ORDER — ALBUTEROL SULFATE (2.5 MG/3ML) 0.083% IN NEBU
5.0000 mg | INHALATION_SOLUTION | Freq: Once | RESPIRATORY_TRACT | Status: AC
  Administered 2015-04-28: 5 mg via RESPIRATORY_TRACT
  Filled 2015-04-28: qty 6

## 2015-04-28 MED ORDER — CALCIUM CARBONATE ANTACID 500 MG PO CHEW: 1.0000 | CHEWABLE_TABLET | Freq: Two times a day (BID) | ORAL | Status: DC | PRN

## 2015-04-28 MED ORDER — IOHEXOL 350 MG/ML SOLN
100.0000 mL | Freq: Once | INTRAVENOUS | Status: AC | PRN
  Administered 2015-04-28: 68 mL via INTRAVENOUS

## 2015-04-28 MED ORDER — HYDROCODONE-ACETAMINOPHEN 10-325 MG PO TABS: 1.0000 | ORAL_TABLET | Freq: Two times a day (BID) | ORAL | Status: DC | PRN

## 2015-04-28 MED ORDER — ADULT MULTIVITAMIN W/MINERALS CH
1.0000 | ORAL_TABLET | Freq: Every day | ORAL | Status: DC
  Administered 2015-04-28 – 2015-05-01 (×4): 1 via ORAL
  Filled 2015-04-28 (×4): qty 1

## 2015-04-28 MED ORDER — MOMETASONE FURO-FORMOTEROL FUM 200-5 MCG/ACT IN AERO
2.0000 | INHALATION_SPRAY | Freq: Two times a day (BID) | RESPIRATORY_TRACT | Status: DC
  Administered 2015-04-28 – 2015-05-01 (×6): 2 via RESPIRATORY_TRACT
  Filled 2015-04-28: qty 8.8

## 2015-04-28 MED ORDER — ENOXAPARIN SODIUM 60 MG/0.6ML ~~LOC~~ SOLN
60.0000 mg | Freq: Two times a day (BID) | SUBCUTANEOUS | Status: DC
  Administered 2015-04-29 – 2015-04-30 (×3): 60 mg via SUBCUTANEOUS
  Filled 2015-04-28 (×3): qty 0.6

## 2015-04-28 MED ORDER — POLYETHYLENE GLYCOL 3350 17 G PO PACK: 17.0000 g | PACK | Freq: Every day | ORAL | Status: DC | PRN

## 2015-04-28 MED ORDER — HYDROCHLOROTHIAZIDE 25 MG PO TABS
25.0000 mg | ORAL_TABLET | Freq: Every day | ORAL | Status: DC
  Administered 2015-04-29 – 2015-05-01 (×3): 25 mg via ORAL
  Filled 2015-04-28 (×3): qty 1

## 2015-04-28 MED ORDER — ENOXAPARIN SODIUM 60 MG/0.6ML ~~LOC~~ SOLN
60.0000 mg | SUBCUTANEOUS | Status: AC
  Administered 2015-04-28: 60 mg via SUBCUTANEOUS
  Filled 2015-04-28 (×2): qty 0.6

## 2015-04-28 MED ORDER — IPRATROPIUM BROMIDE 0.02 % IN SOLN
0.5000 mg | Freq: Once | RESPIRATORY_TRACT | Status: AC
  Administered 2015-04-28: 0.5 mg via RESPIRATORY_TRACT
  Filled 2015-04-28: qty 2.5

## 2015-04-28 MED ORDER — ALBUTEROL SULFATE (2.5 MG/3ML) 0.083% IN NEBU
3.0000 mL | INHALATION_SOLUTION | RESPIRATORY_TRACT | Status: DC | PRN
  Administered 2015-04-29 – 2015-04-30 (×5): 3 mL via RESPIRATORY_TRACT
  Filled 2015-04-28 (×6): qty 3

## 2015-04-28 MED ORDER — LORATADINE 10 MG PO TABS
10.0000 mg | ORAL_TABLET | Freq: Every day | ORAL | Status: DC
  Administered 2015-04-28 – 2015-05-01 (×4): 10 mg via ORAL
  Filled 2015-04-28 (×4): qty 1

## 2015-04-28 MED ORDER — TRAZODONE HCL 50 MG PO TABS
50.0000 mg | ORAL_TABLET | Freq: Every day | ORAL | Status: DC
  Administered 2015-04-28 – 2015-04-30 (×3): 50 mg via ORAL
  Filled 2015-04-28 (×3): qty 1

## 2015-04-28 MED ORDER — FAMOTIDINE 20 MG PO TABS
20.0000 mg | ORAL_TABLET | Freq: Two times a day (BID) | ORAL | Status: DC
  Administered 2015-04-28 – 2015-05-01 (×6): 20 mg via ORAL
  Filled 2015-04-28 (×6): qty 1

## 2015-04-28 MED ORDER — METHYLPREDNISOLONE SODIUM SUCC 40 MG IJ SOLR
40.0000 mg | Freq: Two times a day (BID) | INTRAMUSCULAR | Status: DC
Start: 2015-04-28 — End: 2015-05-01
  Administered 2015-04-28 – 2015-04-30 (×5): 40 mg via INTRAVENOUS
  Filled 2015-04-28 (×5): qty 1

## 2015-04-28 MED ORDER — DULOXETINE HCL 30 MG PO CPEP
30.0000 mg | ORAL_CAPSULE | Freq: Every day | ORAL | Status: DC
  Administered 2015-04-28 – 2015-05-01 (×4): 30 mg via ORAL
  Filled 2015-04-28 (×4): qty 1

## 2015-04-28 MED ORDER — DIPHENOXYLATE-ATROPINE 2.5-0.025 MG PO TABS: 1.0000 | ORAL_TABLET | Freq: Four times a day (QID) | ORAL | Status: DC | PRN

## 2015-04-28 MED ORDER — ONDANSETRON HCL 4 MG/2ML IJ SOLN: 4.0000 mg | Freq: Four times a day (QID) | INTRAMUSCULAR | Status: DC | PRN

## 2015-04-28 MED ORDER — METHYLPREDNISOLONE SODIUM SUCC 125 MG IJ SOLR
125.0000 mg | Freq: Once | INTRAMUSCULAR | Status: AC
  Administered 2015-04-28: 125 mg via INTRAVENOUS
  Filled 2015-04-28: qty 2

## 2015-04-28 MED ORDER — LORAZEPAM 0.5 MG PO TABS
0.5000 mg | ORAL_TABLET | Freq: Every evening | ORAL | Status: DC | PRN
  Administered 2015-04-28 – 2015-04-30 (×3): 0.5 mg via ORAL
  Filled 2015-04-28 (×3): qty 1

## 2015-04-28 MED ORDER — LISINOPRIL 20 MG PO TABS
40.0000 mg | ORAL_TABLET | Freq: Every day | ORAL | Status: DC
  Administered 2015-04-29 – 2015-05-01 (×3): 40 mg via ORAL
  Filled 2015-04-28 (×3): qty 2

## 2015-04-28 MED ORDER — SODIUM CHLORIDE 0.9% FLUSH
3.0000 mL | Freq: Two times a day (BID) | INTRAVENOUS | Status: DC
  Administered 2015-04-28 – 2015-05-01 (×6): 3 mL via INTRAVENOUS

## 2015-04-28 MED ORDER — DOCUSATE SODIUM 100 MG PO CAPS: 100.0000 mg | ORAL_CAPSULE | Freq: Every day | ORAL | Status: DC | PRN

## 2015-04-28 MED ORDER — ONDANSETRON HCL 4 MG PO TABS: 4.0000 mg | ORAL_TABLET | Freq: Four times a day (QID) | ORAL | Status: DC | PRN

## 2015-04-28 MED ORDER — LISINOPRIL-HYDROCHLOROTHIAZIDE 20-12.5 MG PO TABS
2.0000 | ORAL_TABLET | Freq: Every morning | ORAL | Status: DC
Start: 2015-04-29 — End: 2015-04-28

## 2015-04-28 MED ORDER — ACETAMINOPHEN 325 MG PO TABS: 650.0000 mg | ORAL_TABLET | ORAL | Status: DC | PRN

## 2015-04-28 NOTE — Progress Notes (Signed)
ANTICOAGULATION CONSULT NOTE - Initial Consult  Pharmacy Consult for Lovenox Indication: pulmonary embolus  Allergies  Allergen Reactions  . Pollen Extract     Seasonal allergies, congestion and stuffiness    Patient Measurements:     Vital Signs: Temp: 97.5 F (36.4 C) (03/21 0919) Temp Source: Oral (03/21 0919) BP: 121/80 mmHg (03/21 1430) Pulse Rate: 115 (03/21 1430)  Labs:  Recent Labs  04/28/15 1050  HGB 8.5*  HCT 26.5*  PLT 131*  CREATININE 1.23*    Estimated Creatinine Clearance: 36.2 mL/min (by C-G formula based on Cr of 1.23).   Medical History: Past Medical History  Diagnosis Date  . Hypertension   . COPD (chronic obstructive pulmonary disease) ([place])   . Hyperlipidemia   . GERD (gastroesophageal reflux disease)   . Osteopenia   . Insomnia   . Mediastinal lymphadenopathy 11/19/13    PER CT  . Multiple lung nodules on CT 11/19/13    LEFT LUNG BASE  . Lung mass 11/19/13    RUL PER CT  . Coronary artery calcification seen on CAT scan 11/21/2013  . COPD (chronic obstructive pulmonary disease) ([place]) 11/21/2013  . Pneumonia     hx of  . Anxiety   . Encounter for antineoplastic chemotherapy 12/03/2014  . Chemotherapy induced neutropenia ([place]) 02/11/2015  . stage iv sq cell lung ca dx'd 11/2013    Recist     Medications:  Scheduled:  . enoxaparin (LOVENOX) injection  60 mg Subcutaneous STAT   Infusions:    Assessment:  70 yr female with c/o SOB x 2 weeks which reportedly is worsening.  H/O lung CA and undergoing chemotherapy.  Pt does report some RLE swelling and pain.    CTAngio shows + PE in right lower lobe  Pharmacy consulted to dose Lovenox for PE treatment  CrCl = 36 ml/min  Goal of Therapy:  Monitor platelets by anticoagulation protocol: Yes   Plan:   Lovenox '60mg'$  sq q12h  Follow renal function and adjust Lovenox dose should CrCl fall < 30 ml/min  Monitor CBC  Jaeceon Michelin, [place], PharmD 04/28/2015,3:20 PM

## 2015-04-28 NOTE — Telephone Encounter (Signed)
Pt called with dyspnea and states it has happened rapidly. She denies pain. I hear her trying to catch her breath as she is talking to me. She is on her way to XRT. I instructed her to go to ED now. Dr. Sondra Come , Elmo Putt and  [place] [address] , Anna notfified.

## 2015-04-28 NOTE — ED Notes (Signed)
Bed: WA25 Expected date:  Expected time:  Means of arrival:  Comments: Triage 1 

## 2015-04-28 NOTE — ED Notes (Signed)
Pt presents with c/o shortness of breath that started approx 2 weeks ago and has gotten progressively worse. Pt's last chemo treatment was March 1st. Pt reports she has stage 4 lung cancer. Pt is 86% on RA upon arrival to triage. Pt is able to talk in complete sentences but is having significant trouble catching her breath.

## 2015-04-28 NOTE — H&P (Signed)
Patient Demographics  Tammie Clay, is a 70 y.o. female  MRN: [id number]   DOB - 05/09/1945  Admit Date - 04/28/2015  Outpatient Primary MD for the patient is Tammie Neer, MD   Assessment Tammie Clay  is a pleasant 70 y.o. female with COPD/Stage 4 Lung Ca on chemoradiation therapy managed by Dr Tammie Clay/admitted 3 weeks ago for COPD Exacerbation/Gerd/Essential Htn, amog other medical problems , who comes in with increasing shortness of breath with exertion/left sided pleuritic chest pain, associated with swelling and pain of the right leg, and she is found to have Acute pulmonary embolism by CTA chest, "The study is positive for subsegmental acute pulmonary thromboembolism in the right lower lobe". Patient likely has lower extremity dvt. Her labs are significant for white count 7.500, Hb 8.5g/dl, platelet count 131.000 and bun/cr 37/1.23, AST 107 ALT 105. Tammie Clay has no prior history of dvt/pe, hemoptysis or gi bleed. There is no suggestion of infection at this point. She will therefore be admitted to telemetry for further management including Venous doppler/2D echo(to check for right ventricular strain), Lovenox, bronchodilators, oxygen supplementation and systemic steroids. We will need to monitor thrombocytopenia, ?d/c anticoagulation if worsening. Would ask oncology to weigh in on long term anticoagulation(which patient will probably need for life). Patient is DNR/DNI per her wishes.  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 Code Status   DNR/DNI Likely DC  Home  Condition GUARDED    Time spent in minutes : 54  Chief Complaint Chief Complaint  Patient presents with  . Shortness of Breath     HPI Patient complaints of Sob for 1 week, right leg swelling and pain for 1-2 weeks. She also has Left sided pleuritic chest pain, especially at night. No cough/fever,hemoptysis, vomiting or diarrhea. Sob with exertion mostly, but also at rest. She was hospitalized a few weeks ago and was treated for COPD Exacerbation. She has been receiving chemoradiation therapy. She called the cancer center to see if she could go see them and they recommemnded ED visit.  Review of Systems   As in the HPI above.   Past Medical History  Diagnosis Date  . Hypertension   . COPD (chronic obstructive pulmonary disease) ([place])   . Hyperlipidemia   . GERD (gastroesophageal reflux disease)   . Osteopenia   . Insomnia   . Mediastinal lymphadenopathy 11/19/13    PER CT  . Multiple lung nodules on CT 11/19/13    LEFT LUNG BASE  . Lung mass 11/19/13    RUL PER CT  . Coronary artery calcification seen on CAT scan 11/21/2013  . COPD (chronic obstructive pulmonary disease) ([place]) 11/21/2013  . Pneumonia     hx of  . Anxiety   . Encounter for antineoplastic chemotherapy 12/03/2014  . Chemotherapy induced neutropenia ([place]) 02/11/2015  . stage iv sq cell lung ca  dx'd 11/2013    Recist       Past Surgical History  Procedure Laterality Date  .  c section x 2    . Urethral cyst removed    . Tonsillectomy    . Appendectomy    . Colonoscopy with propofol N/A 07/17/2012    Procedure: COLONOSCOPY WITH PROPOFOL;  Surgeon: Garlan Fair, MD;  Location: [facility]  ENDOSCOPY;  Service: Endoscopy;  Laterality: N/A;  . Esophagogastroduodenoscopy (egd) with propofol N/A 07/17/2012    Procedure: ESOPHAGOGASTRODUODENOSCOPY (EGD) WITH PROPOFOL;  Surgeon: Garlan Fair, MD;  Location: [facility] ENDOSCOPY;  Service: Endoscopy;  Laterality: N/A;  . Video bronchoscopy with endobronchial ultrasound N/A 12/03/2013    Procedure: VIDEO BRONCHOSCOPY WITH ENDOBRONCHIAL ULTRASOUND WITH BIOPSIES;  Surgeon: Grace Isaac, MD;  Location: [facility];  Service: Thoracic;  Laterality: N/A;  . Portacath placement Left 12/10/2013    Procedure: INSERTION PORT-A-CATH;  Surgeon: Grace Isaac, MD;  Location[facility];  Service: Thoracic;  Laterality: Left;    Social History Social History  Substance Use Topics  . Smoking status: Former Smoker -- 0.50 packs/day for 50 years    Types: Cigarettes, E-cigarettes    Quit date: 10/08/2012  . Smokeless tobacco: Never Used     Comment: smokes Vapor daily- smokes about 6-8 ecig (vapor), 15 puffs daily  . Alcohol Use: Yes     Comment: social    Family History Family History  Problem Relation Age of Onset  . Diabetes Father   . Heart disease Father     MI  . CVA Mother   . Cancer Cousin     LIPOSARCOMA  . Heart disease Cousin     HEART TRANSPLANTS X 2    Prior to Admission medications   Medication Sig Start Date End Date Taking? Authorizing Provider  acetaminophen (TYLENOL) 325 MG tablet Take 650 mg by mouth every 6 (six) hours as needed for moderate pain or headache. Take 2 tablets every morning   Yes Historical Provider, MD  albuterol (PROVENTIL HFA;VENTOLIN HFA) 108 (90 BASE) MCG/ACT inhaler Inhale 2 puffs into the lungs every 6 (six) hours as needed for wheezing.   Yes Historical Provider, MD  calcium carbonate (TUMS - DOSED IN MG ELEMENTAL CALCIUM) 500 MG chewable tablet Chew 1 tablet by mouth 2 (two) times daily as needed for indigestion or heartburn.    Yes Historical Provider, MD  diphenoxylate-atropine (LOMOTIL) 2.5-0.025 MG  tablet TAKE 2 TABLETS 4 TIMES A DAY AS NEEDED FOR DIARRHEA Patient taking differently: Take 2 tablets by mouth 4 times a day as needed for diarrhea 12/29/14  Yes Curt Bears, MD  docusate sodium (COLACE) 100 MG capsule Take 100 mg by mouth daily as needed for moderate constipation.    Yes Historical Provider, MD  DULoxetine (CYMBALTA) 30 MG capsule TAKE ONE CAPSULE BY MOUTH EVERY DAY Patient taking differently: Take 1 capsule by mouth every day 03/08/15  Yes Curt Bears, MD  Fexofenadine HCl [facility] ALLERGY PO) Take 1 tablet by mouth daily. Reported on 04/21/2015   Yes Historical Provider, MD  HYDROcodone-acetaminophen (NORCO) 10-325 MG per tablet Take 1 tablet by mouth 2 (two) times daily as needed for moderate pain or severe pain.    Yes Historical Provider, MD  lidocaine-prilocaine (EMLA) cream Apply 1 application topically as needed. Patient taking differently: Apply 1 application topically as needed (port access site pain for chemo treatment).  12/05/13  Yes Curt Bears, MD  lisinopril-hydrochlorothiazide (PRINZIDE,ZESTORETIC) 20-12.5 MG per tablet Take 2  tablets by mouth every morning.    Yes Historical Provider, MD  loratadine (CLARITIN) 10 MG tablet Take 10 mg by mouth daily.   Yes Historical Provider, MD  LORazepam (ATIVAN) 2 MG tablet Take 3 tablets by mouth each day at bedtime as needed for sleep 03/26/15  Yes Historical Provider, MD  metoprolol succinate (TOPROL-XL) 50 MG 24 hr tablet Take 50 mg by mouth every morning. Take with or immediately following a meal.   Yes Historical Provider, MD  Multiple Vitamin (MULTIVITAMIN WITH MINERALS) TABS Take 1 tablet by mouth daily.   Yes Historical Provider, MD  polyethylene glycol (MIRALAX / GLYCOLAX) packet Take 17 g by mouth daily as needed for moderate constipation or severe constipation.    Yes Historical Provider, MD  prochlorperazine (COMPAZINE) 10 MG tablet Take 1 tablet (10 mg total) by mouth every 6 (six) hours as needed for  nausea or vomiting. Patient taking differently: Take 10 mg by mouth daily as needed for nausea or vomiting.  04/23/14  Yes Owens Shark, NP  SYMBICORT 160-4.5 MCG/ACT inhaler Inhale 2 puffs into the lungs two times a day 03/14/15  Yes Historical Provider, MD  TRAZODONE HCL PO Take 2 tablets by mouth at bedtime.   Yes Historical Provider, MD  Wound Dressings (SONAFINE EX) Apply topically.   Yes Historical Provider, MD  doxycycline (VIBRA-TABS) 100 MG tablet Take 100 mg by mouth 2 (two) times daily. 04/09/15   Historical Provider, MD  predniSONE (STERAPRED UNI-PAK 21 TAB) 10 MG (21) TBPK tablet See admin instructions. 04/09/15   Historical Provider, MD    Allergies  Allergen Reactions  . Pollen Extract     Seasonal allergies, congestion and stuffiness    Physical Exam  Vitals  Blood pressure 147/82, pulse 118, temperature 98.5 F (36.9 C), temperature source Oral, resp. rate 22, height '5\' 2"'$  (1.575 m), weight 63.549 kg (140 lb 1.6 oz), SpO2 97 %.   General:  Mild respiratory distress.  Cardiovascular: S1-S2 normal. No murmurs. Pulse regular.  Respiratory: Good air entry bilaterally. No rhonchi or rales. Bilateral transmitted sounds.  Abdomen: Soft and nontender. Normal bowel sounds. No organomegaly.  Musculoskeletal: No pedal edema   Neurological: Intact  Data Review CBC  Recent Labs Lab 04/28/15 1050  WBC 7.5  HGB 8.5*  HCT 26.5*  PLT 131*  MCV 98.1  MCH 31.5  MCHC 32.1  RDW 20.7*  LYMPHSABS 0.7  MONOABS 0.7  EOSABS 0.0  BASOSABS 0.0   ------------------------------------------------------------------------------------------------------------------  Chemistries   Recent Labs Lab 04/28/15 1050  NA 132*  K 4.5  CL 92*  CO2 26  GLUCOSE 126*  BUN 37*  CREATININE 1.23*  CALCIUM 9.1  AST 107*  ALT 105*  ALKPHOS 95  BILITOT 0.7   ------------------------------------------------------------------------------------------------------------------ estimated  creatinine clearance is 36.2 mL/min (by C-G formula based on Cr of 1.23). ------------------------------------------------------------------------------------------------------------------ No results for input(s): TSH, T4TOTAL, T3FREE, THYROIDAB in the last 72 hours.  Invalid input(s): FREET3   Coagulation profile No results for input(s): INR, PROTIME in the last 168 hours. ------------------------------------------------------------------------------------------------------------------- No results for input(s): DDIMER in the last 72 hours. -------------------------------------------------------------------------------------------------------------------  Cardiac Enzymes No results for input(s): CKMB, TROPONINI, MYOGLOBIN in the last 168 hours.  Invalid input(s): CK ------------------------------------------------------------------------------------------------------------------ Invalid input(s): POCBNP   ---------------------------------------------------------------------------------------------------------------  Urinalysis    Component Value Date/Time   COLORURINE YELLOW 04/05/2015 0041   APPEARANCEUR CLEAR 04/05/2015 0041   LABSPEC 1.014 04/05/2015 0041   PHURINE 5.5 04/05/2015 0041   GLUCOSEU NEGATIVE 04/05/2015 0041  HGBUR NEGATIVE 04/05/2015 0041   BILIRUBINUR NEGATIVE 04/05/2015 0041   KETONESUR NEGATIVE 04/05/2015 0041   PROTEINUR NEGATIVE 04/05/2015 0041   NITRITE NEGATIVE 04/05/2015 0041   LEUKOCYTESUR TRACE* 04/05/2015 0041    ----------------------------------------------------------------------------------------------------------------  Imaging results  Dg Chest 2 View  04/28/2015  CLINICAL DATA:  Shortness of breath for 3 days. History of previous lung carcinoma EXAM: CHEST  2 VIEW COMPARISON:  April 04, 2015 chest radiograph and chest CT March 30, 2015 FINDINGS: There is scarring with volume loss on the left, stable. The mass in the medial aspect of  the left upper lobe seen on recent CT is not well visualized by radiography, as was the case on prior chest radiograph. No new opacity is evident. The heart size is normal. Port-A-Cath tip is in the superior vena cava. Adenopathy seen on CT is not well seen radiographically. There is a destructive lesion in the lateral left sixth rib, not appreciably changed. No new rib lesions identified. IMPRESSION: Postoperative change with scarring on the left. The known mass in the medial left upper lobe region is not well seen radiographically. There is no change in cardiac silhouette. There is a stable destructive lesion in the lateral left sixth rib. No new opacity. Electronically Signed   By: Lowella Grip III M.D.   On: 04/28/2015 09:40   Dg Chest 2 View  04/04/2015  CLINICAL DATA:  70 year old current history of left upper lobe lung cancer, presenting with 2 day history of fever and shortness of breath. Patient's most recent chemotherapy treatment was 4 days ago. EXAM: CHEST  2 VIEW COMPARISON:  CT chest 03/30/2015 and earlier. Two-view chest x-ray 10/01/2014. FINDINGS: Cardiac silhouette upper normal in size, unchanged. Thoracic aorta atherosclerotic, unchanged. Medial left upper lobe lung mass as noted on recent CT is partially obscured by the port on the PA image. Chronic elevation left hemidiaphragm with chronic scar/atelectasis in the left lower lobe, unchanged. The multiple small nodules identified on CT are less apparent on chest x-ray. Lungs otherwise clear. No pleural effusions. Degenerative changes throughout the thoracic spine. Left subclavian Port-A-Cath tip in the upper SVC. IMPRESSION: Medial left upper lobe lung mass as noted on the recent chest CT. Chronic elevation left hemidiaphragm with chronic scar/atelectasis in the left lower lobe. No acute cardiopulmonary disease. Electronically Signed   By: Evangeline Dakin M.D.   On: 04/04/2015 18:46   Ct Chest W Contrast  03/30/2015  CLINICAL DATA:   Lung cancer diagnosed in October 2015. Chemotherapy in progress. Left anterior chest discomfort for 1 month. Shortness of breath. EXAM: CT CHEST, ABDOMEN, AND PELVIS WITH CONTRAST TECHNIQUE: Multidetector CT imaging of the chest, abdomen and pelvis was performed following the standard protocol during bolus administration of intravenous contrast. CONTRAST:  135m OMNIPAQUE IOHEXOL 300 MG/ML  SOLN COMPARISON:  Multiple exams, including 01/13/2015 FINDINGS: CT CHEST FINDINGS Mediastinum/Nodes: Right lower paratracheal node 1.8 cm in short axis on image 22 series 2, stable. AP window lymph node 1.4 cm in short axis on image 21 of series 2, previously 1.1 cm by my measurement. Subcarinal lymph node 1.9 cm in short axis on image 26 series 2, formerly 2.1 cm. Right hilar node 1.3 cm in short axis on image 26 series 2, formerly 1.1 cm. Paraesophageal node 1.4 cm in short axis on image 31 series 2, previously 1.3 cm. Soft tissue density along the left pericardial margin 6.0 by 3.9 cm, formerly 6.6 by 4.3 cm by my measurement, with some nodularity extending to the pleural  surface along the lingula, and possibly with some degree of atelectasis. Coronary, aortic arch, and branch vessel atherosclerotic vascular disease. Lungs/Pleura: Trace left pleural effusion, reduced in size from previous, nonspecific for transudative versus exudative etiology. Emphysema. New 0.4 by 0.5 cm nodule in the right upper lobe on image 11 series 4. Additional new nodules are present such as the 4 mm nodule in the posterior basal segment right lower lobe. Some of the nodules in the right lung are stable and some are larger than previous. One example of a larger nodule is the 6 by 5 mm right lower lobe nodule on image 41 of series 4 which previously measured 5 by 4 mm. There is stable scarring in the right middle lobe. Generally stable nodules in the left lung. Musculoskeletal: Generally increase in size of the lytic lesion in the left sixth rib  laterally, for example comparing image 30 of series 4 to image 26 of series 4, with the lesion measuring 3 cm along the length of the rib is post prior measurement of 1.8 cm, and potentially with increased soft tissue component extending into the adjacent fifth intercostal space. There is some new early left fifth rib erosion due to the intercostal portion of this mass which the was not previously readily apparent. Small foci of sclerosis in the sternal body appear stable. Small sclerotic lesion in the neck of the right scapula, stable. CT ABDOMEN PELVIS FINDINGS Hepatobiliary: There are multiple hypodense lesions in the liver. These are for the most part stable, aside from a new 0.5 by 0.3 cm lesion in segment 7 on image 48 series 2. Hypodense lesion in segment 4 of the liver on image 53 series 2 measures 7 mm in diameter, previously 5 mm but at times in the past this has measured up to 7 mm. Pancreas: Unremarkable Spleen: Unremarkable Adrenals/Urinary Tract: Non rotated right kidney several tiny hypodense renal lesions are technically nonspecific although statistically likely to be cysts in the unchanged. Stomach/Bowel: Mild wall thickening in the stomach antrum. Prominent stool throughout the colon favors constipation. Mild sigmoid diverticulosis. Vascular/Lymphatic: Aortoiliac atherosclerotic vascular disease. Infrarenal abdominal aortic aneurysm, 3.1 by 2.8 cm, with mural thrombus observed. Reproductive: Suspected anterior uterine fibroid given the deviation of the thin endometrium posteriorly. 4.5 by 3.6 cm cystic lesion of the left ovary, fluid density. Other: No supplemental non-categorized findings. Musculoskeletal: Chronic left iliopsoas lipoma. Lumbar spondylosis and degenerative disc disease with mild grade 1 retrolisthesis at L1-2. IMPRESSION: 1. Mild progression of disease with some new and mildly enlarging pulmonary nodules, enlarging lytic lesion of the left sixth rib with some extension into the  fifth intercostal space and new early erosion of the left fifth rib, and generally stable pathologic thoracic adenopathy. 2. New 5 mm hypodense lesion in posteriorly in segment 7 along the dome of the liver merits observation 3. Small infrarenal abdominal aortic aneurysm. Recommend followup by ultrasound in 3 years. This recommendation follows ACR consensus guidelines: White Paper of the ACR Incidental Findings Committee II on Vascular Findings. J Am Coll Radiol 2013; 83:419-622 4. Other imaging findings of potential clinical significance: Coronary, aortic arch, and branch vessel atherosclerotic vascular disease. Aortoiliac atherosclerotic vascular disease. Emphysema. Mild wall thickening in the stomach antrum, gastritis not excluded. Prominent stool throughout the colon favors constipation. Mild sigmoid diverticulosis. Suspected anterior uterine fibroid. Electronically Signed   By: Van Clines M.D.   On: 03/30/2015 08:16   Ct Angio Chest Pe W/cm &/or Wo Cm  04/28/2015  CLINICAL DATA:  Short of breath for 2 weeks EXAM: CT ANGIOGRAPHY CHEST WITH CONTRAST TECHNIQUE: Multidetector CT imaging of the chest was performed using the standard protocol during bolus administration of intravenous contrast. Multiplanar CT image reconstructions and MIPs were obtained to evaluate the vascular anatomy. CONTRAST:  40m OMNIPAQUE IOHEXOL 350 MG/ML SOLN COMPARISON:  03/30/2015 FINDINGS: There is subsegmental pulmonary thromboembolism in the right lower lobe. Thrombus is present and at least 3 subsegmental branches on image 64 of series 4. No large central pulmonary thromboembolism. Atherosclerotic changes of the aortic arch and coronary arteries. Great vessels are grossly patent. No aortic dissection or aneurysm. Stable mass along the left anterior mediastinum on image 40. 14 mm AP window node on image 30 is stable. Stable right precarinal node on image 33. Stable subcarinal node on image 37. Emphysema is present. Right  upper lobe pulmonary nodule on image 9 has increased from 5 mm to 6 mm. Anterior right upper lobe nodule on image 17 measures 9 mm and has increased in size. Left lower lobe pulmonary nodule on image 49 measures 8 mm and previously was 7 mm. Right lower lobe pulmonary nodule on image 38 is larger. Innumerable nodules towards the base of the right lower lobe have increased in number and size compatible with progression of disease. Interlobular septal thickening in the left upper lobe extending from the mass is worse suggesting at their post obstructive pneumonitis or lymphangitic spread of tumor. Destructive lesion involving the left sixth rib with erosion of the adjacent fifth rib is stable. Sclerotic foci in the sternum are stable. Small left pleural effusion is larger. Left subclavian Port-A-Cath is stable. Liver lesions are grossly unchanged. Review of the MIP images confirms the above findings. IMPRESSION: The study is positive for subsegmental acute pulmonary thromboembolism in the right lower lobe. Critical Value/emergent results were called by telephone at the time of interpretation on 04/28/2015 at 2:30 pm to Dr. NDavonna Belling, who verbally acknowledged these results. Stable left upper lobe mass and stable mediastinal adenopathy. There is increasing interstitial lung disease adjacent to the mass in the left upper lobe either due to pneumonitis or lymphangitic spread of tumor. Pulmonary nodules have increased in number and size compatible with progression of disease. Left pleural effusion Stable left rib and sternal lesions. Liver lesions are grossly unchanged. Electronically Signed   By: AMarybelle KillingsM.D.   On: 04/28/2015 14:32   Ct Abdomen Pelvis W Contrast  03/30/2015  CLINICAL DATA:  Lung cancer diagnosed in October 2015. Chemotherapy in progress. Left anterior chest discomfort for 1 month. Shortness of breath. EXAM: CT CHEST, ABDOMEN, AND PELVIS WITH CONTRAST TECHNIQUE: Multidetector CT imaging of  the chest, abdomen and pelvis was performed following the standard protocol during bolus administration of intravenous contrast. CONTRAST:  1020mOMNIPAQUE IOHEXOL 300 MG/ML  SOLN COMPARISON:  Multiple exams, including 01/13/2015 FINDINGS: CT CHEST FINDINGS Mediastinum/Nodes: Right lower paratracheal node 1.8 cm in short axis on image 22 series 2, stable. AP window lymph node 1.4 cm in short axis on image 21 of series 2, previously 1.1 cm by my measurement. Subcarinal lymph node 1.9 cm in short axis on image 26 series 2, formerly 2.1 cm. Right hilar node 1.3 cm in short axis on image 26 series 2, formerly 1.1 cm. Paraesophageal node 1.4 cm in short axis on image 31 series 2, previously 1.3 cm. Soft tissue density along the left pericardial margin 6.0 by 3.9 cm, formerly 6.6 by 4.3 cm by my measurement, with some nodularity extending  to the pleural surface along the lingula, and possibly with some degree of atelectasis. Coronary, aortic arch, and branch vessel atherosclerotic vascular disease. Lungs/Pleura: Trace left pleural effusion, reduced in size from previous, nonspecific for transudative versus exudative etiology. Emphysema. New 0.4 by 0.5 cm nodule in the right upper lobe on image 11 series 4. Additional new nodules are present such as the 4 mm nodule in the posterior basal segment right lower lobe. Some of the nodules in the right lung are stable and some are larger than previous. One example of a larger nodule is the 6 by 5 mm right lower lobe nodule on image 41 of series 4 which previously measured 5 by 4 mm. There is stable scarring in the right middle lobe. Generally stable nodules in the left lung. Musculoskeletal: Generally increase in size of the lytic lesion in the left sixth rib laterally, for example comparing image 30 of series 4 to image 26 of series 4, with the lesion measuring 3 cm along the length of the rib is post prior measurement of 1.8 cm, and potentially with increased soft tissue  component extending into the adjacent fifth intercostal space. There is some new early left fifth rib erosion due to the intercostal portion of this mass which the was not previously readily apparent. Small foci of sclerosis in the sternal body appear stable. Small sclerotic lesion in the neck of the right scapula, stable. CT ABDOMEN PELVIS FINDINGS Hepatobiliary: There are multiple hypodense lesions in the liver. These are for the most part stable, aside from a new 0.5 by 0.3 cm lesion in segment 7 on image 48 series 2. Hypodense lesion in segment 4 of the liver on image 53 series 2 measures 7 mm in diameter, previously 5 mm but at times in the past this has measured up to 7 mm. Pancreas: Unremarkable Spleen: Unremarkable Adrenals/Urinary Tract: Non rotated right kidney several tiny hypodense renal lesions are technically nonspecific although statistically likely to be cysts in the unchanged. Stomach/Bowel: Mild wall thickening in the stomach antrum. Prominent stool throughout the colon favors constipation. Mild sigmoid diverticulosis. Vascular/Lymphatic: Aortoiliac atherosclerotic vascular disease. Infrarenal abdominal aortic aneurysm, 3.1 by 2.8 cm, with mural thrombus observed. Reproductive: Suspected anterior uterine fibroid given the deviation of the thin endometrium posteriorly. 4.5 by 3.6 cm cystic lesion of the left ovary, fluid density. Other: No supplemental non-categorized findings. Musculoskeletal: Chronic left iliopsoas lipoma. Lumbar spondylosis and degenerative disc disease with mild grade 1 retrolisthesis at L1-2. IMPRESSION: 1. Mild progression of disease with some new and mildly enlarging pulmonary nodules, enlarging lytic lesion of the left sixth rib with some extension into the fifth intercostal space and new early erosion of the left fifth rib, and generally stable pathologic thoracic adenopathy. 2. New 5 mm hypodense lesion in posteriorly in segment 7 along the dome of the liver merits  observation 3. Small infrarenal abdominal aortic aneurysm. Recommend followup by ultrasound in 3 years. This recommendation follows ACR consensus guidelines: White Paper of the ACR Incidental Findings Committee II on Vascular Findings. J Am Coll Radiol 2013; 98:119-147 4. Other imaging findings of potential clinical significance: Coronary, aortic arch, and branch vessel atherosclerotic vascular disease. Aortoiliac atherosclerotic vascular disease. Emphysema. Mild wall thickening in the stomach antrum, gastritis not excluded. Prominent stool throughout the colon favors constipation. Mild sigmoid diverticulosis. Suspected anterior uterine fibroid. Electronically Signed   By: Van Clines M.D.   On: 03/30/2015 08:16        Yassir Enis M.D on 04/28/2015 at 11:07  PM  Between 7am to 7pm - Pager - [phone]  After 7pm go to [email or web address] - password [email or web address]  And look for the night coverage person covering me after hours  [facility] Office  [phone]

## 2015-04-28 NOTE — ED Provider Notes (Signed)
CSN: [id number]     Arrival date & time 04/28/15  8546 History   First MD Initiated Contact with Patient 04/28/15 [phone]     Chief Complaint  Patient presents with  . Shortness of Breath     Patient is a 70 y.o. female presenting with shortness of breath.  Shortness of Breath Associated symptoms: cough and wheezing   Associated symptoms: no abdominal pain and no chest pain   Patient resents with shortness of breath. Has history of COPD but also has stage IV lung cancer on chemotherapy. Last chemotherapy was around 3 weeks ago. The last week she's having worsening shortness of breath. Occasional dry cough. No fevers. States it feels like her COPD. She is not on oxygen at home. No chest pain. States she does have some swelling and pain in her right calf, which has been new over the last couple months. Initial pulse ox in the 80s.  Past Medical History  Diagnosis Date  . Hypertension   . COPD (chronic obstructive pulmonary disease) ([place])   . Hyperlipidemia   . GERD (gastroesophageal reflux disease)   . Osteopenia   . Insomnia   . Mediastinal lymphadenopathy 11/19/13    PER CT  . Multiple lung nodules on CT 11/19/13    LEFT LUNG BASE  . Lung mass 11/19/13    RUL PER CT  . Coronary artery calcification seen on CAT scan 11/21/2013  . COPD (chronic obstructive pulmonary disease) ([place]) 11/21/2013  . Pneumonia     hx of  . Anxiety   . Encounter for antineoplastic chemotherapy 12/03/2014  . Chemotherapy induced neutropenia ([place]) 02/11/2015  . stage iv sq cell lung ca dx'd 11/2013    Recist    Past Surgical History  Procedure Laterality Date  .  c section x 2    . Urethral cyst removed    . Tonsillectomy    . Appendectomy    . Colonoscopy with propofol N/A 07/17/2012    Procedure: COLONOSCOPY WITH PROPOFOL;  Surgeon: Garlan Fair, MD;  Location: [facility];  Service: Endoscopy;  Laterality: N/A;  . Esophagogastroduodenoscopy (egd) with propofol N/A 07/17/2012    Procedure:  ESOPHAGOGASTRODUODENOSCOPY (EGD) WITH PROPOFOL;  Surgeon: Garlan Fair, MD;  Location: [facility];  Service: Endoscopy;  Laterality: N/A;  . Video bronchoscopy with endobronchial ultrasound N/A 12/03/2013    Procedure: VIDEO BRONCHOSCOPY WITH ENDOBRONCHIAL ULTRASOUND WITH BIOPSIES;  Surgeon: Grace Isaac, MD;  Location: [place];  Service: Thoracic;  Laterality: N/A;  . Portacath placement Left 12/10/2013    Procedure: INSERTION PORT-A-CATH;  Surgeon: Grace Isaac, MD;  Location: [place];  Service: Thoracic;  Laterality: Left;   Family History  Problem Relation Age of Onset  . Diabetes Father   . Heart disease Father     MI  . CVA Mother   . Cancer Cousin     LIPOSARCOMA  . Heart disease Cousin     HEART TRANSPLANTS X 2   Social History  Substance Use Topics  . Smoking status: Former Smoker -- 0.50 packs/day for 50 years    Types: Cigarettes, E-cigarettes    Quit date: 10/08/2012  . Smokeless tobacco: Never Used     Comment: smokes Vapor daily- smokes about 6-8 ecig (vapor), 15 puffs daily  . Alcohol Use: Yes     Comment: social   OB History    No data available     Review of Systems  Constitutional: Negative for chills and appetite change.  Respiratory:  Positive for cough, shortness of breath and wheezing.   Cardiovascular: Positive for leg swelling. Negative for chest pain.  Gastrointestinal: Negative for abdominal pain.  Genitourinary: Negative for dysuria.  Musculoskeletal: Negative for back pain.  Skin: Negative for wound.  Neurological: Negative for seizures.  Hematological: Negative for adenopathy.      Allergies  Pollen extract  Home Medications   Prior to Admission medications   Medication Sig Start Date End Date Taking? Authorizing Provider  acetaminophen (TYLENOL) 325 MG tablet Take 650 mg by mouth every 6 (six) hours as needed for moderate pain or headache. Take 2 tablets every morning   Yes Historical Provider, MD  albuterol (PROVENTIL  HFA;VENTOLIN HFA) 108 (90 BASE) MCG/ACT inhaler Inhale 2 puffs into the lungs every 6 (six) hours as needed for wheezing.   Yes Historical Provider, MD  calcium carbonate (TUMS - DOSED IN MG ELEMENTAL CALCIUM) 500 MG chewable tablet Chew 1 tablet by mouth 2 (two) times daily as needed for indigestion or heartburn.    Yes Historical Provider, MD  diphenoxylate-atropine (LOMOTIL) 2.5-0.025 MG tablet TAKE 2 TABLETS 4 TIMES A DAY AS NEEDED FOR DIARRHEA Patient taking differently: Take 2 tablets by mouth 4 times a day as needed for diarrhea 12/29/14  Yes Curt Bears, MD  docusate sodium (COLACE) 100 MG capsule Take 100 mg by mouth daily as needed for moderate constipation.    Yes Historical Provider, MD  DULoxetine (CYMBALTA) 30 MG capsule TAKE ONE CAPSULE BY MOUTH EVERY DAY Patient taking differently: Take 1 capsule by mouth every day 03/08/15  Yes Curt Bears, MD  Fexofenadine HCl [facility] ALLERGY PO) Take 1 tablet by mouth daily. Reported on 04/21/2015   Yes Historical Provider, MD  HYDROcodone-acetaminophen (NORCO) 10-325 MG per tablet Take 1 tablet by mouth 2 (two) times daily as needed for moderate pain or severe pain.    Yes Historical Provider, MD  lidocaine-prilocaine (EMLA) cream Apply 1 application topically as needed. Patient taking differently: Apply 1 application topically as needed (port access site pain for chemo treatment).  12/05/13  Yes Curt Bears, MD  lisinopril-hydrochlorothiazide (PRINZIDE,ZESTORETIC) 20-12.5 MG per tablet Take 2 tablets by mouth every morning.    Yes Historical Provider, MD  loratadine (CLARITIN) 10 MG tablet Take 10 mg by mouth daily.   Yes Historical Provider, MD  LORazepam (ATIVAN) 2 MG tablet Take 3 tablets by mouth each day at bedtime as needed for sleep 03/26/15  Yes Historical Provider, MD  metoprolol succinate (TOPROL-XL) 50 MG 24 hr tablet Take 50 mg by mouth every morning. Take with or immediately following a meal.   Yes Historical Provider, MD   Multiple Vitamin (MULTIVITAMIN WITH MINERALS) TABS Take 1 tablet by mouth daily.   Yes Historical Provider, MD  polyethylene glycol (MIRALAX / GLYCOLAX) packet Take 17 g by mouth daily as needed for moderate constipation or severe constipation.    Yes Historical Provider, MD  prochlorperazine (COMPAZINE) 10 MG tablet Take 1 tablet (10 mg total) by mouth every 6 (six) hours as needed for nausea or vomiting. Patient taking differently: Take 10 mg by mouth daily as needed for nausea or vomiting.  04/23/14  Yes Owens Shark, NP  SYMBICORT 160-4.5 MCG/ACT inhaler Inhale 2 puffs into the lungs two times a day 03/14/15  Yes Historical Provider, MD  TRAZODONE HCL PO Take 2 tablets by mouth at bedtime.   Yes Historical Provider, MD  Wound Dressings (SONAFINE EX) Apply topically.   Yes Historical Provider, MD  doxycycline (VIBRA-TABS)  100 MG tablet Take 100 mg by mouth 2 (two) times daily. 04/09/15   Historical Provider, MD  predniSONE (STERAPRED UNI-PAK 21 TAB) 10 MG (21) TBPK tablet See admin instructions. 04/09/15   Historical Provider, MD   BP 120/78 mmHg  Pulse 115  Temp(Src) 97.5 F (36.4 C) (Oral)  Resp 31  SpO2 93% Physical Exam  Constitutional: She appears well-developed.  HENT:  Head: Atraumatic.  Eyes: Pupils are equal, round, and reactive to light.  Neck: Neck supple.  Cardiovascular:  Mild tachycardia  Pulmonary/Chest:  Tachypnea with somewhat quiet wheezes.  Abdominal: Soft. There is no tenderness.  Musculoskeletal: She exhibits tenderness.  Mild tenderness to right calf.  Neurological: She is alert.  Skin: Skin is warm.    ED Course  Procedures (including critical care time) Labs Review Labs Reviewed  COMPREHENSIVE METABOLIC PANEL - Abnormal; Notable for the following:    Sodium 132 (*)    Chloride 92 (*)    Glucose, Bld 126 (*)    BUN 37 (*)    Creatinine, Ser 1.23 (*)    AST 107 (*)    ALT 105 (*)    GFR calc non Af Amer 43 (*)    GFR calc Af Amer 50 (*)    All other  components within normal limits  CBC WITH DIFFERENTIAL/PLATELET - Abnormal; Notable for the following:    RBC 2.70 (*)    Hemoglobin 8.5 (*)    HCT 26.5 (*)    RDW 20.7 (*)    Platelets 131 (*)    All other components within normal limits    Imaging Review Dg Chest 2 View  04/28/2015  CLINICAL DATA:  Shortness of breath for 3 days. History of previous lung carcinoma EXAM: CHEST  2 VIEW COMPARISON:  April 04, 2015 chest radiograph and chest CT March 30, 2015 FINDINGS: There is scarring with volume loss on the left, stable. The mass in the medial aspect of the left upper lobe seen on recent CT is not well visualized by radiography, as was the case on prior chest radiograph. No new opacity is evident. The heart size is normal. Port-A-Cath tip is in the superior vena cava. Adenopathy seen on CT is not well seen radiographically. There is a destructive lesion in the lateral left sixth rib, not appreciably changed. No new rib lesions identified. IMPRESSION: Postoperative change with scarring on the left. The known mass in the medial left upper lobe region is not well seen radiographically. There is no change in cardiac silhouette. There is a stable destructive lesion in the lateral left sixth rib. No new opacity. Electronically Signed   By: Lowella Grip III M.D.   On: 04/28/2015 09:40   Ct Angio Chest Pe W/cm &/or Wo Cm  04/28/2015  CLINICAL DATA:  Short of breath for 2 weeks EXAM: CT ANGIOGRAPHY CHEST WITH CONTRAST TECHNIQUE: Multidetector CT imaging of the chest was performed using the standard protocol during bolus administration of intravenous contrast. Multiplanar CT image reconstructions and MIPs were obtained to evaluate the vascular anatomy. CONTRAST:  72m OMNIPAQUE IOHEXOL 350 MG/ML SOLN COMPARISON:  03/30/2015 FINDINGS: There is subsegmental pulmonary thromboembolism in the right lower lobe. Thrombus is present and at least 3 subsegmental branches on image 64 of series 4. No large  central pulmonary thromboembolism. Atherosclerotic changes of the aortic arch and coronary arteries. Great vessels are grossly patent. No aortic dissection or aneurysm. Stable mass along the left anterior mediastinum on image 40. 14 mm AP window node  on image 30 is stable. Stable right precarinal node on image 33. Stable subcarinal node on image 37. Emphysema is present. Right upper lobe pulmonary nodule on image 9 has increased from 5 mm to 6 mm. Anterior right upper lobe nodule on image 17 measures 9 mm and has increased in size. Left lower lobe pulmonary nodule on image 49 measures 8 mm and previously was 7 mm. Right lower lobe pulmonary nodule on image 38 is larger. Innumerable nodules towards the base of the right lower lobe have increased in number and size compatible with progression of disease. Interlobular septal thickening in the left upper lobe extending from the mass is worse suggesting at their post obstructive pneumonitis or lymphangitic spread of tumor. Destructive lesion involving the left sixth rib with erosion of the adjacent fifth rib is stable. Sclerotic foci in the sternum are stable. Small left pleural effusion is larger. Left subclavian Port-A-Cath is stable. Liver lesions are grossly unchanged. Review of the MIP images confirms the above findings. IMPRESSION: The study is positive for subsegmental acute pulmonary thromboembolism in the right lower lobe. Critical Value/emergent results were called by telephone at the time of interpretation on 04/28/2015 at 2:30 pm to Dr. Davonna Belling , who verbally acknowledged these results. Stable left upper lobe mass and stable mediastinal adenopathy. There is increasing interstitial lung disease adjacent to the mass in the left upper lobe either due to pneumonitis or lymphangitic spread of tumor. Pulmonary nodules have increased in number and size compatible with progression of disease. Left pleural effusion Stable left rib and sternal lesions. Liver  lesions are grossly unchanged. Electronically Signed   By: Marybelle Killings M.D.   On: 04/28/2015 14:32   I have personally reviewed and evaluated these images and lab results as part of my medical decision-making.   EKG Interpretation   Date/Time:  Tuesday April 28 2015 09:17:04 EDT Ventricular Rate:  109 PR Interval:  123 QRS Duration: 99 QT Interval:  334 QTC Calculation: 450 R Axis:   66 Text Interpretation:  Sinus tachycardia Multiple ventricular premature  complexes Aberrant complex Probable left atrial enlargement Low voltage,  precordial leads Borderline repolarization abnormality rate mildly  increased since last tracing.  Confirmed by Alvino Chapel  MD, Ovid Curd [phone])  on 04/28/2015 9:46:38 AM      MDM   Final diagnoses:  Other acute pulmonary embolism without acute cor pulmonale (HCC)  Acute on chronic respiratory failure with hypoxia ([place])    Patient reason was shortness of breath the last few days. States it feels like her previous COPD but only mild wheezes. She is hypoxic on room air and tachypnea. X-ray was overall reassuring. Found a small pulmonary embolism on CT. Continue oxygen requirement mild tachycardia. Will admit to internal medicine.    Davonna Belling, MD 04/28/15 [phone]

## 2015-04-29 ENCOUNTER — Inpatient Hospital Stay (HOSPITAL_COMMUNITY): Payer: Medicare Other

## 2015-04-29 ENCOUNTER — Other Ambulatory Visit: Payer: Medicare Other

## 2015-04-29 ENCOUNTER — Ambulatory Visit: Payer: Medicare Other

## 2015-04-29 ENCOUNTER — Ambulatory Visit
Admission: RE | Admit: 2015-04-29 | Discharge: 2015-04-29 | Disposition: A | Discharge status: Home / Self Care | Payer: Medicare Other | Source: Ambulatory Visit | Attending: Radiation Oncology | Admitting: Radiation Oncology

## 2015-04-29 ENCOUNTER — Ambulatory Visit: Payer: Medicare Other | Admitting: Internal Medicine

## 2015-04-29 ENCOUNTER — Telehealth: Payer: Self-pay | Admitting: Oncology

## 2015-04-29 DIAGNOSIS — R609 Edema, unspecified: Secondary | ICD-10-CM

## 2015-04-29 DIAGNOSIS — R740 Nonspecific elevation of levels of transaminase and lactic acid dehydrogenase [LDH]: Secondary | ICD-10-CM

## 2015-04-29 DIAGNOSIS — C7951 Secondary malignant neoplasm of bone: Secondary | ICD-10-CM

## 2015-04-29 DIAGNOSIS — I2699 Other pulmonary embolism without acute cor pulmonale: Principal | ICD-10-CM

## 2015-04-29 DIAGNOSIS — R0602 Shortness of breath: Secondary | ICD-10-CM

## 2015-04-29 DIAGNOSIS — J441 Chronic obstructive pulmonary disease with (acute) exacerbation: Secondary | ICD-10-CM

## 2015-04-29 DIAGNOSIS — J449 Chronic obstructive pulmonary disease, unspecified: Secondary | ICD-10-CM

## 2015-04-29 DIAGNOSIS — D696 Thrombocytopenia, unspecified: Secondary | ICD-10-CM

## 2015-04-29 LAB — COMPREHENSIVE METABOLIC PANEL
ALBUMIN: 3.3 g/dL — AB (ref 3.5–5.0)
ALK PHOS: 84 U/L (ref 38–126)
ALT: 80 U/L — AB (ref 14–54)
ANION GAP: 14 (ref 5–15)
AST: 49 U/L — AB (ref 15–41)
BUN: 36 mg/dL — ABNORMAL HIGH (ref 6–20)
CALCIUM: 9.4 mg/dL (ref 8.9–10.3)
CO2: 27 mmol/L (ref 22–32)
Chloride: 96 mmol/L — ABNORMAL LOW (ref 101–111)
Creatinine, Ser: 1.35 mg/dL — ABNORMAL HIGH (ref 0.44–1.00)
GFR calc Af Amer: 44 mL/min — ABNORMAL LOW (ref >60)
GFR calc non Af Amer: 38 mL/min — ABNORMAL LOW (ref >60)
GLUCOSE: 148 mg/dL — AB (ref 65–99)
Potassium: 4.4 mmol/L (ref 3.5–5.1)
SODIUM: 137 mmol/L (ref 135–145)
Total Bilirubin: 0.4 mg/dL (ref 0.3–1.2)
Total Protein: 6.7 g/dL (ref 6.5–8.1)

## 2015-04-29 LAB — APTT: aPTT: 28 seconds (ref 24–37)

## 2015-04-29 LAB — ECHOCARDIOGRAM COMPLETE
Height: 62 in
Weight: 2241.6 oz

## 2015-04-29 LAB — CBC WITH DIFFERENTIAL/PLATELET
Basophils Absolute: 0 10*3/uL (ref 0.0–0.1)
Basophils Relative: 0 %
Eosinophils Absolute: 0 10*3/uL (ref 0.0–0.7)
Eosinophils Relative: 0 %
HEMATOCRIT: 28.7 % — AB (ref 36.0–46.0)
HEMOGLOBIN: 8.9 g/dL — AB (ref 12.0–15.0)
LYMPHS ABS: 0.4 10*3/uL — AB (ref 0.7–4.0)
Lymphocytes Relative: 6 %
MCH: 30.9 pg (ref 26.0–34.0)
MCHC: 31 g/dL (ref 30.0–36.0)
MCV: 99.7 fL (ref 78.0–100.0)
MONOS PCT: 4 %
Monocytes Absolute: 0.3 10*3/uL (ref 0.1–1.0)
NEUTROS ABS: 6 10*3/uL (ref 1.7–7.7)
NEUTROS PCT: 90 %
Platelets: 161 10*3/uL (ref 150–400)
RBC: 2.88 MIL/uL — ABNORMAL LOW (ref 3.87–5.11)
RDW: 20.8 % — ABNORMAL HIGH (ref 11.5–15.5)
WBC: 6.7 10*3/uL (ref 4.0–10.5)

## 2015-04-29 LAB — PROTIME-INR
INR: 1.25 (ref 0.00–1.49)
PROTHROMBIN TIME: 15.9 s — AB (ref 11.6–15.2)

## 2015-04-29 LAB — TSH: TSH: 0.476 u[IU]/mL (ref 0.350–4.500)

## 2015-04-29 MED ORDER — METOPROLOL TARTRATE 1 MG/ML IV SOLN
2.5000 mg | Freq: Once | INTRAVENOUS | Status: AC
  Administered 2015-04-29: 2.5 mg via INTRAVENOUS

## 2015-04-29 MED ORDER — METOPROLOL TARTRATE 1 MG/ML IV SOLN
INTRAVENOUS | Status: AC
  Filled 2015-04-29: qty 5

## 2015-04-29 NOTE — Progress Notes (Signed)
Patient positive for DVT in R leg.  Md made aware.  Will continue to monitor patient closely.

## 2015-04-29 NOTE — Telephone Encounter (Signed)
Cleon Dew, RN on [place].  She said Ms. Weikel can come for radiation today.  Notified Heather, RT on [place].

## 2015-04-29 NOTE — Progress Notes (Signed)
Subjective: The patient is seen and examined today. She was admitted yesterday with progressive shortness of breath. CT angiogram of the chest showed subsegmental pulmonary embolism in the right lower lobe. The patient was started on treatment with Lovenox. She is feeling much better today. She was undergoing Doppler of the lower extremities. 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 She denied having any fever or chills. She has no nausea or vomiting.  Objective: Vital signs in last 24 hours: Temp:  [97.5 F (36.4 C)-98.5 F (36.9 C)] 98.3 F (36.8 C) (03/22 0700) Pulse Rate:  [100-132] 100 (03/22 0700) Resp:  [22-31] 22 (03/22 0700) BP: (113-147)/(76-90) 125/77 mmHg (03/22 0700) SpO2:  [82 %-100 %] 99 % (03/22 0700) Weight:  [140 lb 1.6 oz (63.549 kg)] 140 lb 1.6 oz (63.549 kg) (03/21 1551)  Intake/Output from previous day: 03/21 0701 - 03/22 0700 In: -  Out: 200 [Urine:200] Intake/Output this shift:    General appearance: alert, cooperative, fatigued and no distress Resp: rhonchi bilaterally and wheezes bilaterally Cardio: regular rate and rhythm, S1, S2 normal, no murmur, click, rub or gallop GI: soft, non-tender; bowel sounds normal; no masses,  no organomegaly Extremities: extremities normal, atraumatic, no cyanosis or edema  Lab Results:   Recent Labs  04/28/15 1050 04/29/15 0440  WBC 7.5 6.7  HGB 8.5* 8.9*  HCT 26.5* 28.7*  PLT 131* 161   BMET  Recent Labs  04/28/15 1050 04/29/15 0440  NA 132* 137  K 4.5 4.4  CL 92* 96*  CO2 26 27  GLUCOSE 126* 148*  BUN 37* 36*  CREATININE 1.23* 1.35*  CALCIUM 9.1 9.4    Studies/Results: Dg Chest 2 View  04/28/2015  CLINICAL DATA:  Shortness of breath for 3 days. History of previous lung carcinoma EXAM: CHEST  2 VIEW COMPARISON:  April 04, 2015 chest radiograph and chest CT March 30, 2015 FINDINGS: There is scarring with volume loss on the left, stable. The mass in the medial  aspect of the left upper lobe seen on recent CT is not well visualized by radiography, as was the case on prior chest radiograph. No new opacity is evident. The heart size is normal. Port-A-Cath tip is in the superior vena cava. Adenopathy seen on CT is not well seen radiographically. There is a destructive lesion in the lateral left sixth rib, not appreciably changed. No new rib lesions identified. IMPRESSION: Postoperative change with scarring on the left. The known mass in the medial left upper lobe region is not well seen radiographically. There is no change in cardiac silhouette. There is a stable destructive lesion in the lateral left sixth rib. No new opacity. Electronically Signed   By: Lowella Grip III M.D.   On: 04/28/2015 09:40   Ct Angio Chest Pe W/cm &/or Wo Cm  04/28/2015  CLINICAL DATA:  Short of breath for 2 weeks EXAM: CT ANGIOGRAPHY CHEST WITH CONTRAST TECHNIQUE: Multidetector CT imaging of the chest was performed using the standard protocol during bolus administration of intravenous contrast. Multiplanar CT image reconstructions and MIPs were obtained to evaluate the vascular anatomy. CONTRAST:  53m OMNIPAQUE IOHEXOL 350 MG/ML SOLN COMPARISON:  03/30/2015 FINDINGS: There is subsegmental pulmonary thromboembolism in the right lower lobe. Thrombus is present and at least 3 subsegmental branches on image 64 of series 4. No large central pulmonary thromboembolism. Atherosclerotic changes of the aortic arch and coronary arteries. Great vessels are grossly patent. No aortic dissection or aneurysm. Stable mass along the  left anterior mediastinum on image 40. 14 mm AP window node on image 30 is stable. Stable right precarinal node on image 33. Stable subcarinal node on image 37. Emphysema is present. Right upper lobe pulmonary nodule on image 9 has increased from 5 mm to 6 mm. Anterior right upper lobe nodule on image 17 measures 9 mm and has increased in size. Left lower lobe pulmonary nodule on  image 49 measures 8 mm and previously was 7 mm. Right lower lobe pulmonary nodule on image 38 is larger. Innumerable nodules towards the base of the right lower lobe have increased in number and size compatible with progression of disease. Interlobular septal thickening in the left upper lobe extending from the mass is worse suggesting at their post obstructive pneumonitis or lymphangitic spread of tumor. Destructive lesion involving the left sixth rib with erosion of the adjacent fifth rib is stable. Sclerotic foci in the sternum are stable. Small left pleural effusion is larger. Left subclavian Port-A-Cath is stable. Liver lesions are grossly unchanged. Review of the MIP images confirms the above findings. IMPRESSION: The study is positive for subsegmental acute pulmonary thromboembolism in the right lower lobe. Critical Value/emergent results were called by telephone at the time of interpretation on 04/28/2015 at 2:30 pm to Dr. Davonna Belling , who verbally acknowledged these results. Stable left upper lobe mass and stable mediastinal adenopathy. There is increasing interstitial lung disease adjacent to the mass in the left upper lobe either due to pneumonitis or lymphangitic spread of tumor. Pulmonary nodules have increased in number and size compatible with progression of disease. Left pleural effusion Stable left rib and sternal lesions. Liver lesions are grossly unchanged. Electronically Signed   By: Marybelle Killings M.D.   On: 04/28/2015 14:32    Medications: I have reviewed the patient's current medications.   Assessment/Plan: 1) metastatic non-small cell lung cancer, squamous cell carcinoma status post several chemotherapy regimens. Most recently treated with single agent gemcitabine. Unfortunately the patient has evidence for disease progression on the recent CT scan of the chest performed yesterday. I briefly reviewed the scan results with the patient and I would consider her for a different systemic  chemotherapy regimen after discharge. The patient may also benefit from evaluation by palliative care for discussion of goals of care. She may not be ready hospice discussion yet.  2) new subsegmental acute pulmonary embolism in the right lower lobe: I recommend for the patient to continue anticoagulation with subcutaneous Lovenox. 3) COPD: Continue current medication. Thank you so much for taking good care of Ms. Saadeh, I will continue to follow up the patient with you and assist in her management an as-needed basis.  LOS: 1 day    Marisa Hage K. 04/29/2015

## 2015-04-29 NOTE — Progress Notes (Signed)
*  PRELIMINARY RESULTS* Echocardiogram 2D Echocardiogram has been performed.  Tammie Clay 04/29/2015, 2:22 PM

## 2015-04-29 NOTE — Progress Notes (Signed)
Palliative consult request received Chart reviewed Patient is currently meeting with her best friend and significant other this afternoon Briefly introduced palliative care.  PLAN: Family meeting with patient arranged for 04-30-15 at 0900.  Full note and recommendations to follow. Thank you for the consult.   Loistine Chance MD [facility] health palliative medicine [phone] office [phone] pager

## 2015-04-29 NOTE — Progress Notes (Signed)
VASCULAR LAB PRELIMINARY  PRELIMINARY  PRELIMINARY  PRELIMINARY  Bilateral lower extremity venous duplex completed.    Preliminary report:  Right ; DVT noted in the posterior tibial vein coursing through the distal popliteal vein  No evidence of superficial thrombosis.  No Baker's cyst. Left:  No evidence of DVT, superficial thrombosis, or Baker's cyst.  Molly Maselli, RVS 04/29/2015, 9:08 AM

## 2015-04-29 NOTE — Progress Notes (Signed)
[facility]                                                                              Patient Demographics  Tammie Clay, is a 70 y.o. female, DOB - [birth year], VQQ:[id number]  Admit date - 04/28/2015   Admitting Physician Nat Math, MD  Outpatient Primary MD for the patient is Mayra Neer, MD  LOS - 1   Chief Complaint  Patient presents with  . Shortness of Breath      HPI on 04/28/2015 by Dr. Nat Math Tammie Clay is a pleasant 70 y.o. female with COPD/Stage 4 Lung Ca on chemoradiation therapy managed by Dr Mohammed/admitted 3 weeks ago for COPD Exacerbation/Gerd/Essential Htn, amog other medical problems , who comes in with increasing shortness of breath with exertion/left sided pleuritic chest pain, associated with swelling and pain of the right leg, and she is found to have Acute pulmonary embolism by CTA chest, "The study is positive for subsegmental acute pulmonary thromboembolism in the right lower lobe". Patient likely has lower extremity dvt. Her labs are significant for white count 7.500, Hb 8.5g/dl, platelet count 131.000 and bun/cr 37/1.23, AST 107 ALT 105. Tammie Clay has no prior history of dvt/pe, hemoptysis or gi bleed. There is no suggestion of infection at this point. She will therefore be admitted to telemetry for further management including Venous doppler/2D echo(to check for right ventricular strain), Lovenox, bronchodilators, oxygen supplementation and systemic steroids. We will need to monitor thrombocytopenia, ?d/c anticoagulation if worsening. Would ask oncology to weigh in on long term anticoagulation(which patient will probably need for life). Patient is DNR/DNI per her wishes.   Interim history LE doppler + Right DVT. Placed on lovenox.  Assessment & Plan   Dyspnea/left-sided pleuritic chest pain/acute pulmonary embolism -CTA chest positive for some segmental acute pulmonary thromboembolism in the right lower lobe. -Echocardiogram  pending -Lower extremity Doppler positive for right DVT -Patient started on Lovenox -Oncology consulted and appreciated, recommended continuing Lovenox   ? COPD exacerbation -No evidence of infection -Continue bronchodilators, oxygen supplementation and steroids  Metastatic non-small cell lung cancer, squamous cell carcinoma status post chemotherapy -Measures like treated with single agent gemcitabine -Appears to have disease progression on CT -Oncology consulted and appreciated, recommended possible evaluation by palliative care for discussion of goals of care  Essential hypertension -Continue metoprolol, HCTZ  Transaminitis  -Improving, continue to monitor CMP  Depression -Continue Cymbalta  Thrombocytopenia -Stable  Chronic normocytic Anemia -Baseline hemoglobin approximately 9, currently 8.9 -Continue to monitor CBC  Code Status: DNR  Family Communication: None at bedside  Disposition Plan: Admitted. Continue to monitor  Time Spent in minutes   30 minutes  Procedures  LE doppler  Consults   Oncology   DVT Prophylaxis  Lovenox  Lab Results  Component Value Date   PLT 161 04/29/2015    Medications  Scheduled Meds: . DULoxetine  30 mg Oral Daily  . enoxaparin (LOVENOX) injection  60 mg Subcutaneous Q12H  . famotidine  20 mg Oral BID  . lisinopril  40 mg Oral Daily   And  . hydrochlorothiazide  25 mg Oral Daily  . loratadine  10 mg Oral Daily  . methylPREDNISolone (SOLU-MEDROL) injection  40 mg Intravenous BID  . metoprolol succinate  50 mg Oral q morning - 10a  . mometasone-formoterol  2 puff Inhalation BID  . multivitamin with minerals  1 tablet Oral Daily  . sodium chloride flush  3 mL Intravenous Q12H  . traZODone  50 mg Oral QHS   Continuous Infusions:  PRN Meds:.acetaminophen, albuterol, calcium carbonate, diphenoxylate-atropine, docusate sodium, HYDROcodone-acetaminophen, LORazepam, ondansetron **OR** ondansetron (ZOFRAN) IV, polyethylene  glycol  Antibiotics    Anti-infectives    None      Subjective:   Tammie Clay seen and examined today. Patient states she's feeling better today compared to previous days. Denies any chest pain, abdominal pain, nausea or vomiting. Does feel some mild pain on the right side.  Objective:   Filed Vitals:   04/28/15 1929 04/29/15 0015 04/29/15 0601 04/29/15 0700  BP:  113/76  125/77  Pulse:  102  100  Temp:  98.2 F (36.8 C)  98.3 F (36.8 C)  TempSrc:  Oral  Oral  Resp:  24  22  Height:      Weight:      SpO2: 97% 99% 98% 99%    Wt Readings from Last 3 Encounters:  04/28/15 63.549 kg (140 lb 1.6 oz)  04/21/15 63.322 kg (139 lb 9.6 oz)  04/02/15 65.862 kg (145 lb 3.2 oz)     Intake/Output Summary (Last 24 hours) at 04/29/15 1226 Last data filed at 04/29/15 1108  Gross per 24 hour  Intake      0 ml  Output    450 ml  Net   -450 ml    Exam  General: Well developed, well nourished, NAD  HEENT: NCAT,mucous membranes moist.   Cardiovascular: S1 S2 auscultated, RRR, no murmurs  Respiratory: Diminished breath sounds are clear  Abdomen: Soft, nontender, nondistended, + bowel sounds  Extremities: warm dry without cyanosis clubbing or edema  Neuro: AAOx3, nonfocal  Psych: Normal affect and demeanor   Data Review   Micro Results No results found for this or any previous visit (from the past 240 hour(s)).  Radiology Reports Dg Chest 2 View  04/28/2015  CLINICAL DATA:  Shortness of breath for 3 days. History of previous lung carcinoma EXAM: CHEST  2 VIEW COMPARISON:  April 04, 2015 chest radiograph and chest CT March 30, 2015 FINDINGS: There is scarring with volume loss on the left, stable. The mass in the medial aspect of the left upper lobe seen on recent CT is not well visualized by radiography, as was the case on prior chest radiograph. No new opacity is evident. The heart size is normal. Port-A-Cath tip is in the superior vena cava. Adenopathy seen on  CT is not well seen radiographically. There is a destructive lesion in the lateral left sixth rib, not appreciably changed. No new rib lesions identified. IMPRESSION: Postoperative change with scarring on the left. The known mass in the medial left upper lobe region is not well seen radiographically. There is no change in cardiac silhouette. There is a stable destructive lesion in the lateral left sixth rib. No new opacity. Electronically Signed   By: Lowella Grip III M.D.   On: 04/28/2015 09:40   Dg Chest 2 View  04/04/2015  CLINICAL DATA:  70 year old current history of left upper lobe lung cancer, presenting with 2 day history of fever and shortness of breath. Patient's most recent chemotherapy treatment was 4 days ago. EXAM: CHEST  2 VIEW COMPARISON:  CT chest 03/30/2015 and earlier. Two-view chest  x-ray 10/01/2014. FINDINGS: Cardiac silhouette upper normal in size, unchanged. Thoracic aorta atherosclerotic, unchanged. Medial left upper lobe lung mass as noted on recent CT is partially obscured by the port on the PA image. Chronic elevation left hemidiaphragm with chronic scar/atelectasis in the left lower lobe, unchanged. The multiple small nodules identified on CT are less apparent on chest x-ray. Lungs otherwise clear. No pleural effusions. Degenerative changes throughout the thoracic spine. Left subclavian Port-A-Cath tip in the upper SVC. IMPRESSION: Medial left upper lobe lung mass as noted on the recent chest CT. Chronic elevation left hemidiaphragm with chronic scar/atelectasis in the left lower lobe. No acute cardiopulmonary disease. Electronically Signed   By: Evangeline Dakin M.D.   On: 04/04/2015 18:46   Ct Angio Chest Pe W/cm &/or Wo Cm  04/28/2015  CLINICAL DATA:  Short of breath for 2 weeks EXAM: CT ANGIOGRAPHY CHEST WITH CONTRAST TECHNIQUE: Multidetector CT imaging of the chest was performed using the standard protocol during bolus administration of intravenous contrast. Multiplanar CT  image reconstructions and MIPs were obtained to evaluate the vascular anatomy. CONTRAST:  52m OMNIPAQUE IOHEXOL 350 MG/ML SOLN COMPARISON:  03/30/2015 FINDINGS: There is subsegmental pulmonary thromboembolism in the right lower lobe. Thrombus is present and at least 3 subsegmental branches on image 64 of series 4. No large central pulmonary thromboembolism. Atherosclerotic changes of the aortic arch and coronary arteries. Great vessels are grossly patent. No aortic dissection or aneurysm. Stable mass along the left anterior mediastinum on image 40. 14 mm AP window node on image 30 is stable. Stable right precarinal node on image 33. Stable subcarinal node on image 37. Emphysema is present. Right upper lobe pulmonary nodule on image 9 has increased from 5 mm to 6 mm. Anterior right upper lobe nodule on image 17 measures 9 mm and has increased in size. Left lower lobe pulmonary nodule on image 49 measures 8 mm and previously was 7 mm. Right lower lobe pulmonary nodule on image 38 is larger. Innumerable nodules towards the base of the right lower lobe have increased in number and size compatible with progression of disease. Interlobular septal thickening in the left upper lobe extending from the mass is worse suggesting at their post obstructive pneumonitis or lymphangitic spread of tumor. Destructive lesion involving the left sixth rib with erosion of the adjacent fifth rib is stable. Sclerotic foci in the sternum are stable. Small left pleural effusion is larger. Left subclavian Port-A-Cath is stable. Liver lesions are grossly unchanged. Review of the MIP images confirms the above findings. IMPRESSION: The study is positive for subsegmental acute pulmonary thromboembolism in the right lower lobe. Critical Value/emergent results were called by telephone at the time of interpretation on 04/28/2015 at 2:30 pm to Dr. NDavonna Belling, who verbally acknowledged these results. Stable left upper lobe mass and stable  mediastinal adenopathy. There is increasing interstitial lung disease adjacent to the mass in the left upper lobe either due to pneumonitis or lymphangitic spread of tumor. Pulmonary nodules have increased in number and size compatible with progression of disease. Left pleural effusion Stable left rib and sternal lesions. Liver lesions are grossly unchanged. Electronically Signed   By: AMarybelle KillingsM.D.   On: 04/28/2015 14:32    CBC  Recent Labs Lab 04/28/15 1050 04/29/15 0440  WBC 7.5 6.7  HGB 8.5* 8.9*  HCT 26.5* 28.7*  PLT 131* 161  MCV 98.1 99.7  MCH 31.5 30.9  MCHC 32.1 31.0  RDW 20.7* 20.8*  LYMPHSABS 0.7 0.4*  MONOABS 0.7 0.3  EOSABS 0.0 0.0  BASOSABS 0.0 0.0    Chemistries   Recent Labs Lab 04/28/15 1050 04/29/15 0440  NA 132* 137  K 4.5 4.4  CL 92* 96*  CO2 26 27  GLUCOSE 126* 148*  BUN 37* 36*  CREATININE 1.23* 1.35*  CALCIUM 9.1 9.4  AST 107* 49*  ALT 105* 80*  ALKPHOS 95 84  BILITOT 0.7 0.4   ------------------------------------------------------------------------------------------------------------------ estimated creatinine clearance is 33 mL/min (by C-G formula based on Cr of 1.35). ------------------------------------------------------------------------------------------------------------------ No results for input(s): HGBA1C in the last 72 hours. ------------------------------------------------------------------------------------------------------------------ No results for input(s): CHOL, HDL, LDLCALC, TRIG, CHOLHDL, LDLDIRECT in the last 72 hours. ------------------------------------------------------------------------------------------------------------------  Recent Labs  04/29/15 0440  TSH 0.476   ------------------------------------------------------------------------------------------------------------------ No results for input(s): VITAMINB12, FOLATE, FERRITIN, TIBC, IRON, RETICCTPCT in the last 72 hours.  Coagulation  profile  Recent Labs Lab 04/29/15 0440  INR 1.25    No results for input(s): DDIMER in the last 72 hours.  Cardiac Enzymes No results for input(s): CKMB, TROPONINI, MYOGLOBIN in the last 168 hours.  Invalid input(s): CK ------------------------------------------------------------------------------------------------------------------ Invalid input(s): POCBNP    Florestine Carmical D.O. on 04/29/2015 at 12:26 PM  Between 7am to 7pm - Pager - [phone]  After 7pm go to [email or web address] - password [email or web address]  And look for the night coverage person covering for me after hours  [facility] Office  [phone]

## 2015-04-29 NOTE — Progress Notes (Signed)
Initial Nutrition Assessment  DOCUMENTATION CODES:   Non-severe (moderate) malnutrition in context of chronic illness  INTERVENTION:   -Recommend diet liberalization to regular diet to maximize PO intake and to not limit options. -Provided strategies to aid in taste alterations -Encouraged pt to request Ensure supplements as needed from staff -RD to continue to monitor   NUTRITION DIAGNOSIS:   Increased nutrient needs related to cancer and cancer related treatments as evidenced by estimated needs.  GOAL:   Patient will meet greater than or equal to 90% of their needs  MONITOR:   PO intake, Labs, Weight trends, I & O's  REASON FOR ASSESSMENT:   Malnutrition Screening Tool    ASSESSMENT:   70 y.o. female with COPD/Stage 4 Lung Ca on chemoradiation therapy managed by Dr Mohammed/admitted 3 weeks ago for COPD Exacerbation/Gerd/Essential Htn, amog other medical problems , who comes in with increasing shortness of breath with exertion/left sided pleuritic chest pain, associated with swelling and pain of the right leg, and she is found to have Acute pulmonary embolism by CTA chest.  Patient in room with visitors at bedside. Pt reports wishing she was on a regular diet, as she is craving a grilled cheese and french fries. Pt would like her diet liberalized if this could be possible. States that during her previous admission, her diet was liberalized. If medically able, recommend diet liberalization so that she may order items that are more flavorful for her so that she can increase PO intake. Pt reports taste alterations where food has no flavor at all, which prevents her from eating most of the time. Appetite fluctuates especially when receiving chemo/radiation. Encouraged pt to use mouth rinses prior to meals and to suck on lemon drops/ sour candies/ mints to aid in taste alterations. Patient declines supplements at this time but RD encouraged her to request supplements if appetite is  poor and if PO intake declines.  Patient requesting dessert like apple crisp, RD checked with patient services manager in kitchen to see if we could provide this, but we do not.  Per weight history, pt has lost 12 lb since 12/03/14 (8% wt loss x 5 months, insignificant for time frame). Nutrition-Focused physical exam completed. Findings are mild fat depletion, moderate muscle depletion, and mild edema.   Labs reviewed. Medications: Multivitamin with minerals daily  Diet Order:  Diet Heart Room service appropriate?: Yes; Fluid consistency:: Thin  Skin:  Reviewed, no issues  Last BM:  3/21  Height:   Ht Readings from Last 1 Encounters:  04/28/15 '5\' 2"'$  (1.575 m)    Weight:   Wt Readings from Last 1 Encounters:  04/28/15 140 lb 1.6 oz (63.549 kg)    Ideal Body Weight:  50 kg  BMI:  Body mass index is 25.62 kg/(m^2).  Estimated Nutritional Needs:   Kcal:  1950-2150  Protein:  95-105g  Fluid:  2L/day  EDUCATION NEEDS:   No education needs identified at this time  Clayton Bibles, MS, RD, LDN Pager: [phone] After Hours Pager: [phone]

## 2015-04-30 ENCOUNTER — Encounter: Payer: Self-pay | Admitting: Radiation Oncology

## 2015-04-30 ENCOUNTER — Ambulatory Visit
Admission: RE | Admit: 2015-04-30 | Discharge: 2015-04-30 | Disposition: A | Discharge status: Home / Self Care | Payer: Medicare Other | Source: Ambulatory Visit | Attending: Radiation Oncology | Admitting: Radiation Oncology

## 2015-04-30 ENCOUNTER — Ambulatory Visit
Admit: 2015-04-30 | Discharge: 2015-04-30 | Disposition: A | Discharge status: Home / Self Care | Payer: Medicare Other | Attending: Radiation Oncology | Admitting: Radiation Oncology

## 2015-04-30 DIAGNOSIS — Z515 Encounter for palliative care: Secondary | ICD-10-CM | POA: Insufficient documentation

## 2015-04-30 DIAGNOSIS — C3492 Malignant neoplasm of unspecified part of left bronchus or lung: Secondary | ICD-10-CM

## 2015-04-30 DIAGNOSIS — Z7189 Other specified counseling: Secondary | ICD-10-CM

## 2015-04-30 DIAGNOSIS — J9621 Acute and chronic respiratory failure with hypoxia: Secondary | ICD-10-CM

## 2015-04-30 DIAGNOSIS — R0600 Dyspnea, unspecified: Secondary | ICD-10-CM | POA: Insufficient documentation

## 2015-04-30 LAB — CBC
HCT: 27.1 % — ABNORMAL LOW (ref 36.0–46.0)
Hemoglobin: 8.5 g/dL — ABNORMAL LOW (ref 12.0–15.0)
MCH: 30.4 pg (ref 26.0–34.0)
MCHC: 31.4 g/dL (ref 30.0–36.0)
MCV: 96.8 fL (ref 78.0–100.0)
PLATELETS: 179 10*3/uL (ref 150–400)
RBC: 2.8 MIL/uL — AB (ref 3.87–5.11)
RDW: 20.9 % — ABNORMAL HIGH (ref 11.5–15.5)
WBC: 11.9 10*3/uL — AB (ref 4.0–10.5)

## 2015-04-30 LAB — BASIC METABOLIC PANEL
Anion gap: 11 (ref 5–15)
BUN: 44 mg/dL — ABNORMAL HIGH (ref 6–20)
CALCIUM: 9.4 mg/dL (ref 8.9–10.3)
CHLORIDE: 96 mmol/L — AB (ref 101–111)
CO2: 28 mmol/L (ref 22–32)
CREATININE: 1.29 mg/dL — AB (ref 0.44–1.00)
GFR, EST AFRICAN AMERICAN: 47 mL/min — AB (ref >60)
GFR, EST NON AFRICAN AMERICAN: 40 mL/min — AB (ref >60)
Glucose, Bld: 137 mg/dL — ABNORMAL HIGH (ref 65–99)
Potassium: 4.7 mmol/L (ref 3.5–5.1)
SODIUM: 135 mmol/L (ref 135–145)

## 2015-04-30 MED ORDER — RIVAROXABAN 15 MG PO TABS
15.0000 mg | ORAL_TABLET | Freq: Two times a day (BID) | ORAL | Status: DC
  Administered 2015-04-30 – 2015-05-01 (×2): 15 mg via ORAL
  Filled 2015-04-30 (×2): qty 1

## 2015-04-30 MED ORDER — IPRATROPIUM-ALBUTEROL 0.5-2.5 (3) MG/3ML IN SOLN
3.0000 mL | Freq: Three times a day (TID) | RESPIRATORY_TRACT | Status: DC
Start: 2015-04-30 — End: 2015-05-01
  Administered 2015-05-01: 3 mL via RESPIRATORY_TRACT
  Filled 2015-04-30: qty 3

## 2015-04-30 MED ORDER — MORPHINE SULFATE (CONCENTRATE) 10 MG/0.5ML PO SOLN
5.0000 mg | ORAL | Status: DC | PRN
  Administered 2015-05-01 (×2): 5 mg via ORAL
  Filled 2015-04-30 (×2): qty 0.5

## 2015-04-30 NOTE — Progress Notes (Signed)
[facility]                                                                              Patient Demographics  Tammie Clay, is a 70 y.o. female, DOB - [birth year], FKC:[id number]  Admit date - 04/28/2015   Admitting Physician Nat Math, MD  Outpatient Primary MD for the patient is Mayra Neer, MD  LOS - 2   Chief Complaint  Patient presents with  . Shortness of Breath      HPI on 04/28/2015 by Dr. Nat Math Tammie Clay is a pleasant 70 y.o. female with COPD/Stage 4 Lung Ca on chemoradiation therapy managed by Dr Mohammed/admitted 3 weeks ago for COPD Exacerbation/Gerd/Essential Htn, amog other medical problems , who comes in with increasing shortness of breath with exertion/left sided pleuritic chest pain, associated with swelling and pain of the right leg, and she is found to have Acute pulmonary embolism by CTA chest, "The study is positive for subsegmental acute pulmonary thromboembolism in the right lower lobe". Patient likely has lower extremity dvt. Her labs are significant for white count 7.500, Hb 8.5g/dl, platelet count 131.000 and bun/cr 37/1.23, AST 107 ALT 105. Tammie Clay has no prior history of dvt/pe, hemoptysis or gi bleed. There is no suggestion of infection at this point. She will therefore be admitted to telemetry for further management including Venous doppler/2D echo(to check for right ventricular strain), Lovenox, bronchodilators, oxygen supplementation and systemic steroids. We will need to monitor thrombocytopenia, ?d/c anticoagulation if worsening. Would ask oncology to weigh in on long term anticoagulation(which patient will probably need for life). Patient is DNR/DNI per her wishes.   Interim history LE doppler + Right DVT. Placed on lovenox.  Assessment & Plan   Acute respiratory failure with hypoxia secondary to acute pulmonary embolism -CTA chest positive for some segmental acute pulmonary thromboembolism in the right lower  lobe. -Echocardiogram with grade 1 diastolic dysfunction. However overall stable. -Lower extremity Doppler positive for right DVT -Patient started on Lovenox, pharmacy to dose xarelto -Oncology consulted and appreciated, recommended continuing Lovenox  PE secondary to hypercoagulable state brought on by cancer. Patient's pain is actually chronic, from where her cancer and treatment has been. Looks to have no pain from acute PE Will ambulate, checking oxygen requirements. Currently on 4 L   ? COPD exacerbation -No evidence of infection -Continue bronchodilators, oxygen supplementation and steroids  Metastatic non-small cell lung cancer, squamous cell carcinoma status post chemotherapy -Measures like treated with single agent gemcitabine -Appears to have disease progression on CT -Oncology consulted and appreciated, recommended possible evaluation by palliative care for discussion of goals of care  Essential hypertension -Continue metoprolol, HCTZ  Transaminitis  -Improving, continue to monitor CMP  Depression -Continue Cymbalta  Thrombocytopenia -Stable  Chronic normocytic Anemia -Baseline hemoglobin approximately 9, currently 8.9 -Continue to monitor CBC  Code Status: DNR  Family Communication: Left message for family   Disposition Plan: anticipate discharge tomorrow once we know her oxygen requirements and dose of xarelto   Time Spent in minutes   30 minutes  Procedures  LE doppler: Positive for DVT in bilateral lower extremities   Consults   Oncology   DVT Prophylaxis  Lovenox  Lab Results  Component Value Date   PLT 179 04/30/2015    Medications  Scheduled Meds: . DULoxetine  30 mg Oral Daily  . famotidine  20 mg Oral BID  . lisinopril  40 mg Oral Daily   And  . hydrochlorothiazide  25 mg Oral Daily  . loratadine  10 mg Oral Daily  . methylPREDNISolone (SOLU-MEDROL) injection  40 mg Intravenous BID  . metoprolol succinate  50 mg Oral q morning - 10a   . mometasone-formoterol  2 puff Inhalation BID  . multivitamin with minerals  1 tablet Oral Daily  . Rivaroxaban  15 mg Oral BID WC  . sodium chloride flush  3 mL Intravenous Q12H  . traZODone  50 mg Oral QHS   Continuous Infusions:  PRN Meds:.acetaminophen, albuterol, calcium carbonate, diphenoxylate-atropine, docusate sodium, LORazepam, morphine CONCENTRATE, ondansetron **OR** ondansetron (ZOFRAN) IV, polyethylene glycol  Antibiotics    Anti-infectives    None      Subjective:   Tammie Clay seen and examined today. patient feeling very well. No shortness of breath. Some chronic pain on her left side. Nothing severe.   Objective:   Filed Vitals:   04/29/15 2018 04/29/15 2044 04/30/15 0505 04/30/15 1450  BP:  151/88 134/88 139/71  Pulse:  112 93 92  Temp:  98.5 F (36.9 C) 98.2 F (36.8 C) 98.1 F (36.7 C)  TempSrc:  Oral Oral Oral  Resp:  '24 22 20  '$ Height:      Weight:      SpO2: 97% 98% 100% 100%    Wt Readings from Last 3 Encounters:  04/28/15 63.549 kg (140 lb 1.6 oz)  04/21/15 63.322 kg (139 lb 9.6 oz)  04/02/15 65.862 kg (145 lb 3.2 oz)     Intake/Output Summary (Last 24 hours) at 04/30/15 1551 Last data filed at 04/30/15 0700  Gross per 24 hour  Intake    720 ml  Output    200 ml  Net    520 ml    Exam  General: alert and oriented 3, no acute distress  Cardiovascular: S1 S2 auscultated, RRR, no murmurs  Respiratoryclear to auscultation bilaterally  Abdomen: Soft, nontender, nondistended, + bowel sounds  Extremities: warm dry without cyanosis clubbing or edema    Data Review   Micro Results No results found for this or any previous visit (from the past 240 hour(s)).  Radiology Reports Dg Chest 2 View  04/28/2015  CLINICAL DATA:  Shortness of breath for 3 days. History of previous lung carcinoma EXAM: CHEST  2 VIEW COMPARISON:  April 04, 2015 chest radiograph and chest CT March 30, 2015 FINDINGS: There is scarring with volume  loss on the left, stable. The mass in the medial aspect of the left upper lobe seen on recent CT is not well visualized by radiography, as was the case on prior chest radiograph. No new opacity is evident. The heart size is normal. Port-A-Cath tip is in the superior vena cava. Adenopathy seen on CT is not well seen radiographically. There is a destructive lesion in the lateral left sixth rib, not appreciably changed. No new rib lesions identified. IMPRESSION: Postoperative change with scarring on the left. The known mass in the medial left upper lobe region is not well seen radiographically. There is no change in cardiac silhouette. There is a stable destructive lesion in the lateral left sixth rib. No new opacity. Electronically Signed   By: Lowella Grip III M.D.   On: 04/28/2015 09:40  Dg Chest 2 View  04/04/2015  CLINICAL DATA:  70 year old current history of left upper lobe lung cancer, presenting with 2 day history of fever and shortness of breath. Patient's most recent chemotherapy treatment was 4 days ago. EXAM: CHEST  2 VIEW COMPARISON:  CT chest 03/30/2015 and earlier. Two-view chest x-ray 10/01/2014. FINDINGS: Cardiac silhouette upper normal in size, unchanged. Thoracic aorta atherosclerotic, unchanged. Medial left upper lobe lung mass as noted on recent CT is partially obscured by the port on the PA image. Chronic elevation left hemidiaphragm with chronic scar/atelectasis in the left lower lobe, unchanged. The multiple small nodules identified on CT are less apparent on chest x-ray. Lungs otherwise clear. No pleural effusions. Degenerative changes throughout the thoracic spine. Left subclavian Port-A-Cath tip in the upper SVC. IMPRESSION: Medial left upper lobe lung mass as noted on the recent chest CT. Chronic elevation left hemidiaphragm with chronic scar/atelectasis in the left lower lobe. No acute cardiopulmonary disease. Electronically Signed   By: Evangeline Dakin M.D.   On: 04/04/2015  18:46   Ct Angio Chest Pe W/cm &/or Wo Cm  04/28/2015  CLINICAL DATA:  Short of breath for 2 weeks EXAM: CT ANGIOGRAPHY CHEST WITH CONTRAST TECHNIQUE: Multidetector CT imaging of the chest was performed using the standard protocol during bolus administration of intravenous contrast. Multiplanar CT image reconstructions and MIPs were obtained to evaluate the vascular anatomy. CONTRAST:  67m OMNIPAQUE IOHEXOL 350 MG/ML SOLN COMPARISON:  03/30/2015 FINDINGS: There is subsegmental pulmonary thromboembolism in the right lower lobe. Thrombus is present and at least 3 subsegmental branches on image 64 of series 4. No large central pulmonary thromboembolism. Atherosclerotic changes of the aortic arch and coronary arteries. Great vessels are grossly patent. No aortic dissection or aneurysm. Stable mass along the left anterior mediastinum on image 40. 14 mm AP window node on image 30 is stable. Stable right precarinal node on image 33. Stable subcarinal node on image 37. Emphysema is present. Right upper lobe pulmonary nodule on image 9 has increased from 5 mm to 6 mm. Anterior right upper lobe nodule on image 17 measures 9 mm and has increased in size. Left lower lobe pulmonary nodule on image 49 measures 8 mm and previously was 7 mm. Right lower lobe pulmonary nodule on image 38 is larger. Innumerable nodules towards the base of the right lower lobe have increased in number and size compatible with progression of disease. Interlobular septal thickening in the left upper lobe extending from the mass is worse suggesting at their post obstructive pneumonitis or lymphangitic spread of tumor. Destructive lesion involving the left sixth rib with erosion of the adjacent fifth rib is stable. Sclerotic foci in the sternum are stable. Small left pleural effusion is larger. Left subclavian Port-A-Cath is stable. Liver lesions are grossly unchanged. Review of the MIP images confirms the above findings. IMPRESSION: The study is  positive for subsegmental acute pulmonary thromboembolism in the right lower lobe. Critical Value/emergent results were called by telephone at the time of interpretation on 04/28/2015 at 2:30 pm to Dr. NDavonna Belling, who verbally acknowledged these results. Stable left upper lobe mass and stable mediastinal adenopathy. There is increasing interstitial lung disease adjacent to the mass in the left upper lobe either due to pneumonitis or lymphangitic spread of tumor. Pulmonary nodules have increased in number and size compatible with progression of disease. Left pleural effusion Stable left rib and sternal lesions. Liver lesions are grossly unchanged. Electronically Signed   By: ARodena GoldmannD.  On: 04/28/2015 14:32    CBC  Recent Labs Lab 04/28/15 1050 04/29/15 0440 04/30/15 0447  WBC 7.5 6.7 11.9*  HGB 8.5* 8.9* 8.5*  HCT 26.5* 28.7* 27.1*  PLT 131* 161 179  MCV 98.1 99.7 96.8  MCH 31.5 30.9 30.4  MCHC 32.1 31.0 31.4  RDW 20.7* 20.8* 20.9*  LYMPHSABS 0.7 0.4*  --   MONOABS 0.7 0.3  --   EOSABS 0.0 0.0  --   BASOSABS 0.0 0.0  --     Chemistries   Recent Labs Lab 04/28/15 1050 04/29/15 0440 04/30/15 0447  NA 132* 137 135  K 4.5 4.4 4.7  CL 92* 96* 96*  CO2 '26 27 28  '$ GLUCOSE 126* 148* 137*  BUN 37* 36* 44*  CREATININE 1.23* 1.35* 1.29*  CALCIUM 9.1 9.4 9.4  AST 107* 49*  --   ALT 105* 80*  --   ALKPHOS 95 84  --   BILITOT 0.7 0.4  --    ------------------------------------------------------------------------------------------------------------------ estimated creatinine clearance is 34.5 mL/min (by C-G formula based on Cr of 1.29). ------------------------------------------------------------------------------------------------------------------ No results for input(s): HGBA1C in the last 72 hours. ------------------------------------------------------------------------------------------------------------------ No results for input(s): CHOL, HDL, LDLCALC, TRIG,  CHOLHDL, LDLDIRECT in the last 72 hours. ------------------------------------------------------------------------------------------------------------------  Recent Labs  04/29/15 0440  TSH 0.476   ------------------------------------------------------------------------------------------------------------------ No results for input(s): VITAMINB12, FOLATE, FERRITIN, TIBC, IRON, RETICCTPCT in the last 72 hours.  Coagulation profile  Recent Labs Lab 04/29/15 0440  INR 1.25    No results for input(s): DDIMER in the last 72 hours.  Cardiac Enzymes No results for input(s): CKMB, TROPONINI, MYOGLOBIN in the last 168 hours.  Invalid input(s): CK ------------------------------------------------------------------------------------------------------------------ Invalid input(s): POCBNP    Annita Brod MD on 04/30/2015 at 3:51 PM [email or web address] - password [email or web address]  [phone]

## 2015-04-30 NOTE — Progress Notes (Signed)
ANTICOAGULATION CONSULT NOTE - Initial Consult  Pharmacy Consult for Lovenox -> Xarelto Indication: pulmonary embolus  Allergies  Allergen Reactions  . Pollen Extract     Seasonal allergies, congestion and stuffiness    Patient Measurements: Height: '5\' 2"'$  (157.5 cm) Weight: 140 lb 1.6 oz (63.549 kg) IBW/kg (Calculated) : 50.1   Vital Signs: Temp: 98.1 F (36.7 C) (03/23 1450) Temp Source: Oral (03/23 1450) BP: 139/71 mmHg (03/23 1450) Pulse Rate: 92 (03/23 1450)  Labs:  Recent Labs  04/28/15 1050 04/29/15 0440 04/30/15 0447  HGB 8.5* 8.9* 8.5*  HCT 26.5* 28.7* 27.1*  PLT 131* 161 179  APTT  --  28  --   LABPROT  --  15.9*  --   INR  --  1.25  --   CREATININE 1.23* 1.35* 1.29*    Estimated Creatinine Clearance: 34.5 mL/min (by C-G formula based on Cr of 1.29).   Medical History: Past Medical History  Diagnosis Date  . Hypertension   . COPD (chronic obstructive pulmonary disease) ([place])   . Hyperlipidemia   . GERD (gastroesophageal reflux disease)   . Osteopenia   . Insomnia   . Mediastinal lymphadenopathy 11/19/13    PER CT  . Multiple lung nodules on CT 11/19/13    LEFT LUNG BASE  . Lung mass 11/19/13    RUL PER CT  . Coronary artery calcification seen on CAT scan 11/21/2013  . COPD (chronic obstructive pulmonary disease) ([place]) 11/21/2013  . Pneumonia     hx of  . Anxiety   . Encounter for antineoplastic chemotherapy 12/03/2014  . Chemotherapy induced neutropenia ([place]) 02/11/2015  . stage iv sq cell lung ca dx'd 11/2013    Recist     Medications:  Scheduled:  . DULoxetine  30 mg Oral Daily  . enoxaparin (LOVENOX) injection  60 mg Subcutaneous Q12H  . famotidine  20 mg Oral BID  . lisinopril  40 mg Oral Daily   And  . hydrochlorothiazide  25 mg Oral Daily  . loratadine  10 mg Oral Daily  . methylPREDNISolone (SOLU-MEDROL) injection  40 mg Intravenous BID  . metoprolol succinate  50 mg Oral q morning - 10a  . mometasone-formoterol  2 puff  Inhalation BID  . multivitamin with minerals  1 tablet Oral Daily  . sodium chloride flush  3 mL Intravenous Q12H  . traZODone  50 mg Oral QHS   Infusions:    Assessment: 70 yr female with c/o SOB x 2 weeks which reportedly is worsening.  H/O lung CA and undergoing chemotherapy.  Pt does report some RLE swelling and pain. CTAngio showed +PE in right lower lobe. On 3/21 pharmacy was consulted to dose Lovenox for PE treatment. On 3/23 pharmacy was consulted to transition to [place].  CBC low but stable.   SCr elevated, CrCl ~36m/min. No hx of kidney disease.  Goal of Therapy:  Monitor platelets by anticoagulation protocol: Yes   Plan:   DC Lovenox.  At 1600, start Xarelto '15mg'$  PO BID x 21 days then switch to '20mg'$  PO QPM on 05/22/15.  Provide Xarelto education.   Follow renal function and CBC.  TRomeo Rabon PharmD, pager [phone] 04/30/2015,3:07 PM.

## 2015-04-30 NOTE — Progress Notes (Signed)
  Radiation Oncology         ([phone]) [phone] ________________________________  Name: Tammie Clay MRN: [id number]  Date: 04/30/2015  DOB: [birth year]  Inpatient Weekly Radiation Therapy Management    ICD-9-CM ICD-10-CM   1. 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 . . . . . . . The patient presents for routine under treatment assessment.                                                                    Set-up films were reviewed.                                 The chart was checked.  Tammie Clay has completed 8 fractions to her left rib. She is currently in the hospital for a PE/DVT. She denies having pain in her left side but reports she does have it at night. She denies having any skin irritation. She reports feeling short of breath at times. She reports having to have a breathing treatment this morning.   Physical Findings. . . The lungs are clear. The heart has a regular rhythm and rate. The patient has supplemental oxygen in place by nasal cannula. Impression . . . . . . . The patient is tolerating radiation. Plan . . . . . . . . . . . . Continue treatment as planned.  ________________________________   Blair Promise, PhD, MD   This document serves as a record of services personally performed by Gery Pray, MD. It was created on his behalf by Derek Mound, a trained medical scribe. The creation of this record is based on the scribe's personal observations and the provider's statements to them. This document has been checked and approved by the attending provider.

## 2015-04-30 NOTE — Progress Notes (Addendum)
Tammie Clay has completed 8 fractions to her left rib.  She is currently in the hospital for a PE/DVT.  She denies having pain in her left side but reports she does have it at night.  She denies having any skin irritation.  She reports feeling short of breath at times.  She reports having to have a breathing treatment this morning.

## 2015-04-30 NOTE — Progress Notes (Signed)
SATURATION QUALIFICATIONS: (This note is used to comply with regulatory documentation for home oxygen)  Patient Saturations on Room Air at Rest = 92%  Patient Saturations on Room Air while Ambulating = 87%  Patient Saturations on 2 Liters of oxygen while Ambulating = 94%  Please briefly explain why patient needs home oxygen:

## 2015-04-30 NOTE — Consult Note (Signed)
Consultation Note Date: 04/30/2015   Patient Name: Tammie Clay  DOB: December 29, 1945  MRN: [id number]  Age / Sex: 70 y.o., female  PCP: Tammie Neer, MD Referring Physician: Annita Brod, MD  Reason for Consultation: Establishing goals of care  Life Limiting illness: Lung cancer.   Clinical Assessment/Narrative:  Tammie Clay  is a pleasant 70 y.o. female with COPD/Stage 4 Lung Ca on chemoradiation therapy managed by Dr Tammie Clay/admitted 3 weeks ago for COPD Exacerbation/Gerd/Essential Htn, amog other medical problems , who is now admitted since 04-28-15 with increasing shortness of breath with exertion/left sided pleuritic chest pain, associated with swelling and pain of the right leg, and she is found to have Acute pulmonary embolism by CTA chest, "The study is positive for subsegmental acute pulmonary thromboembolism in the right lower lobe". Patient requested for DNR/DNI at the time of her admission.   The patient was seen by oncology in this admission. She has  evidence for disease progression on the recent CT scan of the chest and will likely be under consideration for a different systemic chemotherapy regimen after discharge as per oncology. A palliative care consult has been requested to initiate initial goals of care discussions.   The patient is resting in bed, her significant other is present at the bedside. Explained in detail about nature of palliative/ supportive care services. Discussed about effective symptom management for dyspnea, will add low dose Roxanol PRN and D/C Norco. Discussed in detail regarding the differences between palliative care and hospice. Patient states that she knows there is no cure, that she will eventually reach a point of no further therapies. How ever, if appropriate, she wishes to continue to receive therapies, chemo and XRT, and wishes to focus on being as well as she can for as  long as she can.   Continue with symptom management, will request Tammie Lessen NP to follow up with her in the cancer center palliative clinic.   Thank you for the consult.     Contacts/Participants in Discussion: Primary Decision Maker:     Relationship to Patient   HCPOA: no  Patient is decisional, she has a significant other who helps her make decisions.    SUMMARY OF RECOMMENDATIONS: No limits on care at this time, besides honoring her request for DNR. Continue with palliative XRT Patient willing to receive further systemic chemotherapy if indicated.  Home with home health care with her significant other on d/c Remains on anticoagulation for her PE Will recommend out patient palliative f/u. We will set this up.    Code Status/Advance Care Planning: DNR    Code Status Orders        Start     Ordered   04/28/15 1712  Do not attempt resuscitation (DNR)   Continuous    Question Answer Comment  In the event of cardiac or respiratory ARREST Do not call a "code blue"   In the event of cardiac or respiratory ARREST Do not perform Intubation, CPR, defibrillation or ACLS   In the event of cardiac or respiratory ARREST Use medication by any route, position, wound care, and other measures to relive pain and suffering. May use oxygen, suction and manual treatment of airway obstruction as needed for comfort.      04/28/15 1717    Code Status History    Date Active Date Inactive Code Status Order ID Comments User Context   04/05/2015  1:04 AM 04/09/2015  5:24 PM Partial Code [id number]  Tammie Costa, MD ED  Advance Directive Documentation        Most Recent Value   Type of Advance Directive  Living will   Pre-existing out of facility DNR order (yellow form or pink MOST form)     "MOST" Form in Place?        Other Directives:None  Symptom Management:    Add low dose Roxanol PO PRN for dyspnea.   Palliative Prophylaxis:   Bowel Regimen  Additional Recommendations  (Limitations, Scope, Preferences):  Patient wishes for further therapies and treatments for her cancer.   Psycho-social/Spiritual:  Support System: Tammie Clay Desire for further Chaplaincy support:no Additional Recommendations: Caregiving  Support/Resources  Prognosis: Unable to determine  Discharge Planning: Home with Home Health   Chief Complaint/ Primary Diagnoses: Present on Admission:  . Pulmonary embolism ([place]) . PE (pulmonary thromboembolism) ([place]) . Stage IV squamous cell carcinoma of left lung ([place]) . Essential (primary) hypertension . COPD exacerbation ([place]) . Thrombocytopenia ([place]) . Transaminitis  I have reviewed the medical record, interviewed the patient and family, and examined the patient. The following aspects are pertinent.  Past Medical History  Diagnosis Date  . Hypertension   . COPD (chronic obstructive pulmonary disease) ([place])   . Hyperlipidemia   . GERD (gastroesophageal reflux disease)   . Osteopenia   . Insomnia   . Mediastinal lymphadenopathy 11/19/13    PER CT  . Multiple lung nodules on CT 11/19/13    LEFT LUNG BASE  . Lung mass 11/19/13    RUL PER CT  . Coronary artery calcification seen on CAT scan 11/21/2013  . COPD (chronic obstructive pulmonary disease) ([place]) 11/21/2013  . Pneumonia     hx of  . Anxiety   . Encounter for antineoplastic chemotherapy 12/03/2014  . Chemotherapy induced neutropenia ([place]) 02/11/2015  . stage iv sq cell lung ca dx'd 11/2013    Recist    Social History   Social History  . Marital Status: Significant Other    Spouse Name: N/A  . Number of Children: 3  . Years of Education: N/A   Social History Main Topics  . Smoking status: Former Smoker -- 0.50 packs/day for 50 years    Types: Cigarettes, E-cigarettes    Quit date: 10/08/2012  . Smokeless tobacco: Never Used     Comment: smokes Vapor daily- smokes about 6-8 ecig (vapor), 15 puffs daily  . Alcohol Use: Yes     Comment: social  . Drug Use: No  .  Sexual Activity: Not Asked   Other Topics Concern  . None   Social History Narrative   Family History  Problem Relation Age of Onset  . Diabetes Father   . Heart disease Father     MI  . CVA Mother   . Cancer Cousin     LIPOSARCOMA  . Heart disease Cousin     HEART TRANSPLANTS X 2   Scheduled Meds: . DULoxetine  30 mg Oral Daily  . enoxaparin (LOVENOX) injection  60 mg Subcutaneous Q12H  . famotidine  20 mg Oral BID  . lisinopril  40 mg Oral Daily   And  . hydrochlorothiazide  25 mg Oral Daily  . loratadine  10 mg Oral Daily  . methylPREDNISolone (SOLU-MEDROL) injection  40 mg Intravenous BID  . metoprolol succinate  50 mg Oral q morning - 10a  . mometasone-formoterol  2 puff Inhalation BID  . multivitamin with minerals  1 tablet Oral Daily  . sodium chloride flush  3 mL Intravenous Q12H  .  traZODone  50 mg Oral QHS   Continuous Infusions:  PRN Meds:.acetaminophen, albuterol, calcium carbonate, diphenoxylate-atropine, docusate sodium, LORazepam, morphine CONCENTRATE, ondansetron **OR** ondansetron (ZOFRAN) IV, polyethylene glycol Medications Prior to Admission:  Prior to Admission medications   Medication Sig Start Date End Date Taking? Authorizing Provider  acetaminophen (TYLENOL) 325 MG tablet Take 650 mg by mouth every 6 (six) hours as needed for moderate pain or headache. Take 2 tablets every morning   Yes Historical Provider, MD  albuterol (PROVENTIL HFA;VENTOLIN HFA) 108 (90 BASE) MCG/ACT inhaler Inhale 2 puffs into the lungs every 6 (six) hours as needed for wheezing.   Yes Historical Provider, MD  calcium carbonate (TUMS - DOSED IN MG ELEMENTAL CALCIUM) 500 MG chewable tablet Chew 1 tablet by mouth 2 (two) times daily as needed for indigestion or heartburn.    Yes Historical Provider, MD  diphenoxylate-atropine (LOMOTIL) 2.5-0.025 MG tablet TAKE 2 TABLETS 4 TIMES A DAY AS NEEDED FOR DIARRHEA Patient taking differently: Take 2 tablets by mouth 4 times a day as needed  for diarrhea 12/29/14  Yes Curt Bears, MD  docusate sodium (COLACE) 100 MG capsule Take 100 mg by mouth daily as needed for moderate constipation.    Yes Historical Provider, MD  DULoxetine (CYMBALTA) 30 MG capsule TAKE ONE CAPSULE BY MOUTH EVERY DAY Patient taking differently: Take 1 capsule by mouth every day 03/08/15  Yes Curt Bears, MD  Fexofenadine HCl [facility] ALLERGY PO) Take 1 tablet by mouth daily. Reported on 04/21/2015   Yes Historical Provider, MD  HYDROcodone-acetaminophen (NORCO) 10-325 MG per tablet Take 1 tablet by mouth 2 (two) times daily as needed for moderate pain or severe pain.    Yes Historical Provider, MD  lidocaine-prilocaine (EMLA) cream Apply 1 application topically as needed. Patient taking differently: Apply 1 application topically as needed (port access site pain for chemo treatment).  12/05/13  Yes Curt Bears, MD  lisinopril-hydrochlorothiazide (PRINZIDE,ZESTORETIC) 20-12.5 MG per tablet Take 2 tablets by mouth every morning.    Yes Historical Provider, MD  loratadine (CLARITIN) 10 MG tablet Take 10 mg by mouth daily.   Yes Historical Provider, MD  LORazepam (ATIVAN) 2 MG tablet Take 3 tablets by mouth each day at bedtime as needed for sleep 03/26/15  Yes Historical Provider, MD  metoprolol succinate (TOPROL-XL) 50 MG 24 hr tablet Take 50 mg by mouth every morning. Take with or immediately following a meal.   Yes Historical Provider, MD  Multiple Vitamin (MULTIVITAMIN WITH MINERALS) TABS Take 1 tablet by mouth daily.   Yes Historical Provider, MD  polyethylene glycol (MIRALAX / GLYCOLAX) packet Take 17 g by mouth daily as needed for moderate constipation or severe constipation.    Yes Historical Provider, MD  prochlorperazine (COMPAZINE) 10 MG tablet Take 1 tablet (10 mg total) by mouth every 6 (six) hours as needed for nausea or vomiting. Patient taking differently: Take 10 mg by mouth daily as needed for nausea or vomiting.  04/23/14  Yes Owens Shark, NP    SYMBICORT 160-4.5 MCG/ACT inhaler Inhale 2 puffs into the lungs two times a day 03/14/15  Yes Historical Provider, MD  TRAZODONE HCL PO Take 2 tablets by mouth at bedtime.   Yes Historical Provider, MD  Wound Dressings (SONAFINE EX) Apply topically.   Yes Historical Provider, MD  doxycycline (VIBRA-TABS) 100 MG tablet Take 100 mg by mouth 2 (two) times daily. 04/09/15   Historical Provider, MD  predniSONE (STERAPRED UNI-PAK 21 TAB) 10 MG (21) TBPK tablet See  admin instructions. 04/09/15   Historical Provider, MD   Allergies  Allergen Reactions  . Pollen Extract     Seasonal allergies, congestion and stuffiness    Review of Systems + for dyspnea, generalized weakness.   Physical Exam Mild acute distress due to dyspnea S1 S2 Coarse breath sounds anteriorly Abdomen soft No edema Patient appears weak, some what cachectic but is awake alert, able to answer all questions appropriately.   Vital Signs: BP 134/88 mmHg  Pulse 93  Temp(Src) 98.2 F (36.8 C) (Oral)  Resp 22  Ht '5\' 2"'$  (1.575 m)  Wt 63.549 kg (140 lb 1.6 oz)  BMI 25.62 kg/m2  SpO2 100%  SpO2: SpO2: 100 % O2 Device:SpO2: 100 % O2 Flow Rate: .O2 Flow Rate (L/min): 3 L/min  IO: Intake/output summary:  Intake/Output Summary (Last 24 hours) at 04/30/15 1031 Last data filed at 04/30/15 0700  Gross per 24 hour  Intake    840 ml  Output    450 ml  Net    390 ml    LBM: Last BM Date: 04/28/15 Baseline Weight: Weight: 63.549 kg (140 lb 1.6 oz) Most recent weight: Weight: 63.549 kg (140 lb 1.6 oz)      Palliative Assessment/Data:  Flowsheet Rows        Most Recent Value   Intake Tab    Referral Department  Oncology   Unit at Time of Referral  Med/Surg Unit   Palliative Care Primary Diagnosis  Cancer   Date Notified  04/29/15   Palliative Care Type  New Palliative care   Reason for referral  Clarify Goals of Care   Date of Admission  04/28/15   Date first seen by Palliative Care  04/29/15   # of days IP prior to  Palliative referral  1   Clinical Assessment    Palliative Performance Scale Score  40%   Pain Max last 24 hours  4   Pain Min Last 24 hours  3   Dyspnea Max Last 24 Hours  7   Dyspnea Min Last 24 hours  4   Psychosocial & Spiritual Assessment    Palliative Care Outcomes    Patient/Family meeting held?  Yes   Who was at the meeting?  patient, significant other.    Palliative Care Outcomes  Clarified goals of care, Counseled regarding hospice   Palliative Care follow-up planned  Yes, Office   Other Treatment Preference Instructions  will refer patient to palliative follow up at the cancer center.       Additional Data Reviewed:  CBC:    Component Value Date/Time   WBC 11.9* 04/30/2015 0447   WBC 19.0* 04/15/2015 1319   HGB 8.5* 04/30/2015 0447   HGB 10.9* 04/15/2015 1319   HCT 27.1* 04/30/2015 0447   HCT 34.0* 04/15/2015 1319   PLT 179 04/30/2015 0447   PLT 144* 04/15/2015 1319   MCV 96.8 04/30/2015 0447   MCV 96.6 04/15/2015 1319   NEUTROABS 6.0 04/29/2015 0440   NEUTROABS 16.7* 04/15/2015 1319   LYMPHSABS 0.4* 04/29/2015 0440   LYMPHSABS 1.4 04/15/2015 1319   MONOABS 0.3 04/29/2015 0440   MONOABS 0.8 04/15/2015 1319   EOSABS 0.0 04/29/2015 0440   EOSABS 0.0 04/15/2015 1319   BASOSABS 0.0 04/29/2015 0440   BASOSABS 0.0 04/15/2015 1319   Comprehensive Metabolic Panel:    Component Value Date/Time   NA 135 04/30/2015 0447   NA 137 04/15/2015 1319   K 4.7 04/30/2015 0447   K 4.5  04/15/2015 1319   CL 96* 04/30/2015 0447   CO2 28 04/30/2015 0447   CO2 28 04/15/2015 1319   BUN 44* 04/30/2015 0447   BUN 40.4* 04/15/2015 1319   CREATININE 1.29* 04/30/2015 0447   CREATININE 1.2* 04/15/2015 1319   CREATININE 0.80 11/22/2013 1054   GLUCOSE 137* 04/30/2015 0447   GLUCOSE 129 04/15/2015 1319   CALCIUM 9.4 04/30/2015 0447   CALCIUM 9.2 04/15/2015 1319   AST 49* 04/29/2015 0440   AST 13 04/15/2015 1319   ALT 80* 04/29/2015 0440   ALT 16 04/15/2015 1319   ALKPHOS 84  04/29/2015 0440   ALKPHOS 86 04/15/2015 1319   BILITOT 0.4 04/29/2015 0440   BILITOT 0.45 04/15/2015 1319   PROT 6.7 04/29/2015 0440   PROT 6.9 04/15/2015 1319   ALBUMIN 3.3* 04/29/2015 0440   ALBUMIN 3.7 04/15/2015 1319     Time In: 9 Time Out: 10 Time Total: 60 min  Greater than 50%  of this time was spent counseling and coordinating care related to the above assessment and plan.  Signed by: Loistine Chance, MD [place], MD  04/30/2015, 10:31 AM  Please contact Palliative Medicine Team phone at [phone] for questions and concerns.

## 2015-04-30 NOTE — Progress Notes (Signed)
[facility] Therapy Treatment Record Phone [phone]   Radiation Therapy was administered to [place] on: 04/30/2015  3:01 PM and was treatment # 8 out of a planned course of 10 treatments.  Radiation Treatment  1). Beam photons with 6-10 energy  2). Brachytherapy None  3). Stereotactic Radiosurgery None  4). Other Radiation None     Brittin Belnap M, Rad Therap

## 2015-05-01 ENCOUNTER — Telehealth: Payer: Self-pay | Admitting: Internal Medicine

## 2015-05-01 ENCOUNTER — Ambulatory Visit
Admission: RE | Admit: 2015-05-01 | Discharge: 2015-05-01 | Disposition: A | Discharge status: Home / Self Care | Payer: Medicare Other | Source: Ambulatory Visit | Attending: Radiation Oncology | Admitting: Radiation Oncology

## 2015-05-01 ENCOUNTER — Ambulatory Visit: Payer: Medicare Other

## 2015-05-01 DIAGNOSIS — I10 Essential (primary) hypertension: Secondary | ICD-10-CM

## 2015-05-01 LAB — BASIC METABOLIC PANEL
ANION GAP: 9 (ref 5–15)
BUN: 45 mg/dL — AB (ref 6–20)
CHLORIDE: 98 mmol/L — AB (ref 101–111)
CO2: 31 mmol/L (ref 22–32)
Calcium: 9.3 mg/dL (ref 8.9–10.3)
Creatinine, Ser: 1.03 mg/dL — ABNORMAL HIGH (ref 0.44–1.00)
GFR calc Af Amer: >60 mL/min (ref >60)
GFR calc non Af Amer: 53 mL/min — ABNORMAL LOW (ref >60)
GLUCOSE: 139 mg/dL — AB (ref 65–99)
POTASSIUM: 4.8 mmol/L (ref 3.5–5.1)
Sodium: 138 mmol/L (ref 135–145)

## 2015-05-01 LAB — CBC
HEMATOCRIT: 27.2 % — AB (ref 36.0–46.0)
HEMOGLOBIN: 8.6 g/dL — AB (ref 12.0–15.0)
MCH: 30.9 pg (ref 26.0–34.0)
MCHC: 31.6 g/dL (ref 30.0–36.0)
MCV: 97.8 fL (ref 78.0–100.0)
Platelets: 195 10*3/uL (ref 150–400)
RBC: 2.78 MIL/uL — ABNORMAL LOW (ref 3.87–5.11)
RDW: 20.8 % — AB (ref 11.5–15.5)
WBC: 11.5 10*3/uL — ABNORMAL HIGH (ref 4.0–10.5)

## 2015-05-01 MED ORDER — RIVAROXABAN 20 MG PO TABS: 20.0000 mg | ORAL_TABLET | Freq: Every day | ORAL | Status: DC

## 2015-05-01 MED ORDER — PREDNISONE 20 MG PO TABS
60.0000 mg | ORAL_TABLET | Freq: Every day | ORAL | Status: DC
  Administered 2015-05-01: 60 mg via ORAL
  Filled 2015-05-01: qty 3

## 2015-05-01 MED ORDER — PREDNISONE 10 MG PO TABS: ORAL_TABLET | ORAL | Status: AC

## 2015-05-01 MED ORDER — RIVAROXABAN 15 MG PO TABS: 15.0000 mg | ORAL_TABLET | Freq: Two times a day (BID) | ORAL | Status: DC

## 2015-05-01 MED ORDER — RIVAROXABAN (XARELTO) VTE STARTER PACK (15 & 20 MG): ORAL_TABLET | ORAL | Status: AC

## 2015-05-01 NOTE — Progress Notes (Signed)
Pt lying in bed resting, HR is elevated to 130's and when up to [facility] HR is elevated to 140's. Pt noted with SOB on exertion and at rest. MD notified. SRP. RN

## 2015-05-01 NOTE — Progress Notes (Signed)
Spoke with pt concerning HH. Pt selected [facility], referral given to in house rep.

## 2015-05-01 NOTE — Care Management Important Message (Signed)
Important Message  Patient Details  Name: Tammie Clay MRN: [id number] Date of Birth: 1945/11/18   Medicare Important Message Given:  Yes    Camillo Flaming 05/01/2015, 11:48 AMImportant Message  Patient Details  Name: Tammie Clay MRN: [id number] Date of Birth: 04/05/1945   Medicare Important Message Given:  Yes    Camillo Flaming 05/01/2015, 11:48 AM

## 2015-05-01 NOTE — Telephone Encounter (Signed)
pt called to get appt....pt ok and aware of d.t

## 2015-05-01 NOTE — Discharge Summary (Addendum)
Physician Discharge Summary  Tammie Clay:[id number] DOB: 1945-04-25 DOA: 04/28/2015  PCP: Tammie Neer, MD  Admit date: 04/28/2015 Discharge date: 05/01/2015  Time spent: 45 minutes  Recommendations for Outpatient Follow-up:  Patient will be discharged to home with home health and palliative care to follow, and home oxygen.  Patient will need to follow up with primary care provider within one week of discharge.  Follow up with oncology. Patient should continue medications as prescribed.  Patient should follow a regular diet.   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 Nat Math Teodora Clay is a pleasant 70 y.o. female with COPD/Stage 4 Lung Ca on chemoradiation therapy managed by Tammie Clay/admitted 3 weeks ago for COPD Exacerbation/Gerd/Essential Htn, amog other medical problems , who comes in with increasing shortness of breath with exertion/left sided pleuritic chest pain, associated with swelling and pain of the right leg, and she is found to have Acute pulmonary embolism by CTA chest, "The study is positive for subsegmental acute pulmonary thromboembolism in the right lower lobe". Patient likely has lower extremity dvt. Her labs are significant for white count 7.500, Hb 8.5g/dl, platelet count 131.000 and bun/cr 37/1.23, AST 107 ALT 105. Tammie Clay has no prior history of dvt/pe, hemoptysis or gi bleed. There is no suggestion of  infection at this point. She will therefore be admitted to telemetry for further management including Venous doppler/2D echo(to check for right ventricular strain), Lovenox, bronchodilators, oxygen supplementation and systemic steroids. We will need to monitor thrombocytopenia, ?d/c anticoagulation if worsening. Would ask oncology to weigh in on long term anticoagulation(which patient will probably need for life). Patient is DNR/DNI per her wishes.   Hospital Course:  Acute on chronic respiratory failure with hypoxia secondary to acute pulmonary embolism -CTA chest positive for some segmental acute pulmonary thromboembolism in the right lower lobe. -Echocardiogram EF 12-75%, grade 1 diastolic dysfunction, no evidence of RV strain by echo -Lower extremity Doppler positive for right DVT -Patient started on Lovenox -Oncology consulted and appreciated, recommended continuing Lovenox -Patient transitioned to Xarelto -Patient's O2 sats dropped to 87% on room air while ambulating.  Will discharge with 2L O2.  ? COPD exacerbation -No evidence of infection -Continue bronchodilators, oxygen supplementation and steroids  Metastatic non-small cell lung cancer, squamous cell carcinoma status post chemotherapy -Measures like treated with single agent gemcitabine -Appears to have disease progression on CT -Oncology consulted and appreciated, recommended possible evaluation by palliative care for discussion of goals of care  Essential hypertension -Continue metoprolol, HCTZ  Transaminitis  -Improving, continue to monitor CMP  Depression -Continue Cymbalta  Thrombocytopenia -Stable  Chronic normocytic Anemia -Baseline hemoglobin approximately 9, currently 8.6 -Continue to monitor CBC  Non-severe malnutrition, moderate -nutrition consulted  Code Status: DNR  Procedures  LE doppler  Consults  Oncology Palliative care  Discharge Exam: Filed Vitals:   05/01/15 0933 05/01/15 0941    BP: 149/90 149/90  Pulse: 105 105  Temp: 98 F (  36.7 C)   Resp: 36     Exam  General: Well developed, well nourished, NAD  HEENT: NCAT,mucous membranes moist.   Cardiovascular: S1 S2 auscultated, RRR, no murmurs  Respiratory: Diminished breath sounds are clear  Abdomen: Soft, nontender, nondistended, + bowel sounds  Extremities: warm dry without cyanosis clubbing or edema  Neuro: AAOx3, nonfocal  Psych: Normal affect and demeanor  Discharge Instructions      Discharge Instructions    Discharge instructions    Complete by:  As directed   Patient will be discharged to home with home health and palliative care to follow, and home oxygen.  Patient will need to follow up with primary care provider within one week of discharge.  Follow up with oncology.  Patient should continue medications as prescribed.  Patient should follow a regular diet.            Medication List    STOP taking these medications        doxycycline 100 MG tablet  Commonly known as:  VIBRA-TABS     predniSONE 10 MG (21) Tbpk tablet  Commonly known as:  STERAPRED UNI-PAK 21 TAB  Replaced by:  predniSONE 10 MG tablet      TAKE these medications        acetaminophen 325 MG tablet  Commonly known as:  TYLENOL  Take 650 mg by mouth every 6 (six) hours as needed for moderate pain or headache. Take 2 tablets every morning     albuterol 108 (90 Base) MCG/ACT inhaler  Commonly known as:  PROVENTIL HFA;VENTOLIN HFA  Inhale 2 puffs into the lungs every 6 (six) hours as needed for wheezing.     ALLEGRA ALLERGY PO  Take 1 tablet by mouth daily. Reported on 04/21/2015     calcium carbonate 500 MG chewable tablet  Commonly known as:  TUMS - dosed in mg elemental calcium  Chew 1 tablet by mouth 2 (two) times daily as needed for indigestion or heartburn.     diphenoxylate-atropine 2.5-0.025 MG tablet  Commonly known as:  LOMOTIL  TAKE 2 TABLETS 4 TIMES A DAY AS NEEDED FOR DIARRHEA     docusate  sodium 100 MG capsule  Commonly known as:  COLACE  Take 100 mg by mouth daily as needed for moderate constipation.     DULoxetine 30 MG capsule  Commonly known as:  CYMBALTA  TAKE ONE CAPSULE BY MOUTH EVERY DAY     HYDROcodone-acetaminophen 10-325 MG tablet  Commonly known as:  NORCO  Take 1 tablet by mouth 2 (two) times daily as needed for moderate pain or severe pain.     lidocaine-prilocaine cream  Commonly known as:  EMLA  Apply 1 application topically as needed.     lisinopril-hydrochlorothiazide 20-12.5 MG tablet  Commonly known as:  PRINZIDE,ZESTORETIC  Take 2 tablets by mouth every morning.     loratadine 10 MG tablet  Commonly known as:  CLARITIN  Take 10 mg by mouth daily.     LORazepam 2 MG tablet  Commonly known as:  ATIVAN  Take 3 tablets by mouth each day at bedtime as needed for sleep     metoprolol succinate 50 MG 24 hr tablet  Commonly known as:  TOPROL-XL  Take 50 mg by mouth every morning. Take with or immediately following a meal.     multivitamin with minerals Tabs tablet  Take 1 tablet by mouth daily.     polyethylene glycol packet  Commonly known as:  MIRALAX / GLYCOLAX  Take 17 g by mouth daily as needed for moderate constipation or severe constipation.     predniSONE 10 MG tablet  Commonly known as:  DELTASONE  Take '40mg'$  (4 tabs) x 3 days, then taper to '30mg'$  (3 tabs) x 3 days, then '20mg'$  (2 tabs) x 3days, then '10mg'$  (1 tab) x 3days, then OFF.     prochlorperazine 10 MG tablet  Commonly known as:  COMPAZINE  Take 1 tablet (10 mg total) by mouth every 6 (six) hours as needed for nausea or vomiting.     Rivaroxaban 15 & 20 MG Tbpk  Commonly known as:  XARELTO STARTER PACK  Take as directed on package: Start with one '15mg'$  tablet by mouth twice a day with food. On Day 22, switch to one '20mg'$  tablet once a day with food.     SONAFINE EX  Apply topically.     SYMBICORT 160-4.5 MCG/ACT inhaler  Generic drug:  budesonide-formoterol  Inhale 2 puffs  into the lungs two times a day     TRAZODONE HCL PO  Take 2 tablets by mouth at bedtime.       Allergies  Allergen Reactions  . Pollen Extract     Seasonal allergies, congestion and stuffiness   Follow-up Information    Follow up with SHAW,KIMBERLEE, MD. Schedule an appointment as soon as possible for a visit in 1 week.   Specialty:  Family Medicine   Why:  Hospital follow up   Contact information:   [address] [place] [address] [phone]       Follow up with Eilleen Kempf., MD. Schedule an appointment as soon as possible for a visit in 1 week.   Specialty:  Oncology   Why:  Hospital follow up   Contact information:   [place] [address] [phone]        The results of significant diagnostics from this hospitalization (including imaging, microbiology, ancillary and laboratory) are listed below for reference.    Significant Diagnostic Studies: Dg Chest 2 View  04/28/2015  CLINICAL DATA:  Shortness of breath for 3 days. History of previous lung carcinoma EXAM: CHEST  2 VIEW COMPARISON:  April 04, 2015 chest radiograph and chest CT March 30, 2015 FINDINGS: There is scarring with volume loss on the left, stable. The mass in the medial aspect of the left upper lobe seen on recent CT is not well visualized by radiography, as was the case on prior chest radiograph. No new opacity is evident. The heart size is normal. Port-A-Cath tip is in the superior vena cava. Adenopathy seen on CT is not well seen radiographically. There is a destructive lesion in the lateral left sixth rib, not appreciably changed. No new rib lesions identified. IMPRESSION: Postoperative change with scarring on the left. The known mass in the medial left upper lobe region is not well seen radiographically. There is no change in cardiac silhouette. There is a stable destructive lesion in the lateral left sixth rib. No new opacity. Electronically Signed   By:  Lowella Grip III M.D.   On: 04/28/2015 09:40   Dg Chest 2 View  04/04/2015  CLINICAL DATA:  70 year old current history of left upper lobe lung cancer, presenting with 2 day history of fever and shortness of breath. Patient's most recent chemotherapy treatment was 4 days ago. EXAM: CHEST  2 VIEW COMPARISON:  CT chest 03/30/2015 and earlier. Two-view chest x-ray 10/01/2014. FINDINGS: Cardiac silhouette  upper normal in size, unchanged. Thoracic aorta atherosclerotic, unchanged. Medial left upper lobe lung mass as noted on recent CT is partially obscured by the port on the PA image. Chronic elevation left hemidiaphragm with chronic scar/atelectasis in the left lower lobe, unchanged. The multiple small nodules identified on CT are less apparent on chest x-ray. Lungs otherwise clear. No pleural effusions. Degenerative changes throughout the thoracic spine. Left subclavian Port-A-Cath tip in the upper SVC. IMPRESSION: Medial left upper lobe lung mass as noted on the recent chest CT. Chronic elevation left hemidiaphragm with chronic scar/atelectasis in the left lower lobe. No acute cardiopulmonary disease. Electronically Signed   By: Evangeline Dakin M.D.   On: 04/04/2015 18:46   Ct Angio Chest Pe W/cm &/or Wo Cm  04/28/2015  CLINICAL DATA:  Short of breath for 2 weeks EXAM: CT ANGIOGRAPHY CHEST WITH CONTRAST TECHNIQUE: Multidetector CT imaging of the chest was performed using the standard protocol during bolus administration of intravenous contrast. Multiplanar CT image reconstructions and MIPs were obtained to evaluate the vascular anatomy. CONTRAST:  10m OMNIPAQUE IOHEXOL 350 MG/ML SOLN COMPARISON:  03/30/2015 FINDINGS: There is subsegmental pulmonary thromboembolism in the right lower lobe. Thrombus is present and at least 3 subsegmental branches on image 64 of series 4. No large central pulmonary thromboembolism. Atherosclerotic changes of the aortic arch and coronary arteries. Great vessels are grossly  patent. No aortic dissection or aneurysm. Stable mass along the left anterior mediastinum on image 40. 14 mm AP window node on image 30 is stable. Stable right precarinal node on image 33. Stable subcarinal node on image 37. Emphysema is present. Right upper lobe pulmonary nodule on image 9 has increased from 5 mm to 6 mm. Anterior right upper lobe nodule on image 17 measures 9 mm and has increased in size. Left lower lobe pulmonary nodule on image 49 measures 8 mm and previously was 7 mm. Right lower lobe pulmonary nodule on image 38 is larger. Innumerable nodules towards the base of the right lower lobe have increased in number and size compatible with progression of disease. Interlobular septal thickening in the left upper lobe extending from the mass is worse suggesting at their post obstructive pneumonitis or lymphangitic spread of tumor. Destructive lesion involving the left sixth rib with erosion of the adjacent fifth rib is stable. Sclerotic foci in the sternum are stable. Small left pleural effusion is larger. Left subclavian Port-A-Cath is stable. Liver lesions are grossly unchanged. Review of the MIP images confirms the above findings. IMPRESSION: The study is positive for subsegmental acute pulmonary thromboembolism in the right lower lobe. Critical Value/emergent results were called by telephone at the time of interpretation on 04/28/2015 at 2:30 pm to Tammie. NDavonna Belling, who verbally acknowledged these results. Stable left upper lobe mass and stable mediastinal adenopathy. There is increasing interstitial lung disease adjacent to the mass in the left upper lobe either due to pneumonitis or lymphangitic spread of tumor. Pulmonary nodules have increased in number and size compatible with progression of disease. Left pleural effusion Stable left rib and sternal lesions. Liver lesions are grossly unchanged. Electronically Signed   By: AMarybelle KillingsM.D.   On: 04/28/2015 14:32    Microbiology: No  results found for this or any previous visit (from the past 240 hour(s)).   Labs: Basic Metabolic Panel:  Recent Labs Lab 04/28/15 1050 04/29/15 0440 04/30/15 0447 05/01/15 0438  NA 132* 137 135 138  K 4.5 4.4 4.7 4.8  CL 92* 96* 96*  98*  CO2 '26 27 28 31  '$ GLUCOSE 126* 148* 137* 139*  BUN 37* 36* 44* 45*  CREATININE 1.23* 1.35* 1.29* 1.03*  CALCIUM 9.1 9.4 9.4 9.3   Liver Function Tests:  Recent Labs Lab 04/28/15 1050 04/29/15 0440  AST 107* 49*  ALT 105* 80*  ALKPHOS 95 84  BILITOT 0.7 0.4  PROT 6.9 6.7  ALBUMIN 3.6 3.3*   No results for input(s): LIPASE, AMYLASE in the last 168 hours. No results for input(s): AMMONIA in the last 168 hours. CBC:  Recent Labs Lab 04/28/15 1050 04/29/15 0440 04/30/15 0447 05/01/15 0438  WBC 7.5 6.7 11.9* 11.5*  NEUTROABS 6.1 6.0  --   --   HGB 8.5* 8.9* 8.5* 8.6*  HCT 26.5* 28.7* 27.1* 27.2*  MCV 98.1 99.7 96.8 97.8  PLT 131* 161 179 195   Cardiac Enzymes: No results for input(s): CKTOTAL, CKMB, CKMBINDEX, TROPONINI in the last 168 hours. BNP: BNP (last 3 results) No results for input(s): BNP in the last 8760 hours.  ProBNP (last 3 results) No results for input(s): PROBNP in the last 8760 hours.  CBG: No results for input(s): GLUCAP in the last 168 hours.     SignedCristal Ford  [facility] 05/01/2015, 10:27 AM

## 2015-05-01 NOTE — Discharge Instructions (Signed)
Information on my medicine - XARELTO (rivaroxaban)  This medication education was reviewed with me or my healthcare representative as part of my discharge preparation.    WHY WAS XARELTO PRESCRIBED FOR YOU? Xarelto was prescribed to treat blood clots that may have been found in the veins of your legs (deep vein thrombosis) or in your lungs (pulmonary embolism) and to reduce the risk of them occurring again.  What do you need to know about Xarelto? The starting dose is one 15 mg tablet taken TWICE daily with food for the FIRST 21 DAYS then on 05/22/15  the dose is changed to one 20 mg tablet taken ONCE A DAY with your evening meal.  DO NOT stop taking Xarelto without talking to the health care provider who prescribed the medication.  Refill your prescription for 20 mg tablets before you run out.  After discharge, you should have regular check-up appointments with your healthcare provider that is prescribing your Xarelto.  In the future your dose may need to be changed if your kidney function changes by a significant amount.  What do you do if you miss a dose? If you are taking Xarelto TWICE DAILY and you miss a dose, take it as soon as you remember. You may take two 15 mg tablets (total 30 mg) at the same time then resume your regularly scheduled 15 mg twice daily the next day.  If you are taking Xarelto ONCE DAILY and you miss a dose, take it as soon as you remember on the same day then continue your regularly scheduled once daily regimen the next day. Do not take two doses of Xarelto at the same time.   Important Safety Information Xarelto is a blood thinner medicine that can cause bleeding. You should call your healthcare provider right away if you experience any of the following: ? Bleeding from an injury or your nose that does not stop. ? Unusual colored urine (red or dark brown) or unusual colored stools (red or black). ? Unusual bruising for unknown reasons. ? A serious fall or  if you hit your head (even if there is no bleeding).  Some medicines may interact with Xarelto and might increase your risk of bleeding while on Xarelto. To help avoid this, consult your healthcare provider or pharmacist prior to using any new prescription or non-prescription medications, including herbals, vitamins, non-steroidal anti-inflammatory drugs (NSAIDs) and supplements.  This website has more information on Xarelto: [email or web address]

## 2015-05-01 NOTE — Progress Notes (Signed)
Heartrate decreased after Morphine administered to pt, pt resting without distress noted. SRP, RN

## 2015-05-04 ENCOUNTER — Encounter: Payer: Self-pay | Admitting: Radiation Oncology

## 2015-05-04 ENCOUNTER — Ambulatory Visit: Payer: Medicare Other

## 2015-05-04 ENCOUNTER — Ambulatory Visit
Admission: RE | Admit: 2015-05-04 | Discharge: 2015-05-04 | Disposition: A | Discharge status: Home / Self Care | Payer: Medicare Other | Source: Ambulatory Visit | Attending: Radiation Oncology | Admitting: Radiation Oncology

## 2015-05-04 DIAGNOSIS — Z87891 Personal history of nicotine dependence: Secondary | ICD-10-CM | POA: Diagnosis not present

## 2015-05-04 DIAGNOSIS — C3492 Malignant neoplasm of unspecified part of left bronchus or lung: Secondary | ICD-10-CM | POA: Diagnosis present

## 2015-05-04 DIAGNOSIS — C7951 Secondary malignant neoplasm of bone: Secondary | ICD-10-CM | POA: Diagnosis present

## 2015-05-04 DIAGNOSIS — J449 Chronic obstructive pulmonary disease, unspecified: Secondary | ICD-10-CM | POA: Diagnosis not present

## 2015-05-04 DIAGNOSIS — I10 Essential (primary) hypertension: Secondary | ICD-10-CM | POA: Diagnosis not present

## 2015-05-04 DIAGNOSIS — D701 Agranulocytosis secondary to cancer chemotherapy: Secondary | ICD-10-CM | POA: Diagnosis not present

## 2015-05-04 DIAGNOSIS — K219 Gastro-esophageal reflux disease without esophagitis: Secondary | ICD-10-CM | POA: Diagnosis not present

## 2015-05-04 DIAGNOSIS — F419 Anxiety disorder, unspecified: Secondary | ICD-10-CM | POA: Diagnosis not present

## 2015-05-04 DIAGNOSIS — I2510 Atherosclerotic heart disease of native coronary artery without angina pectoris: Secondary | ICD-10-CM | POA: Diagnosis not present

## 2015-05-04 DIAGNOSIS — Z510 Encounter for antineoplastic radiation therapy: Secondary | ICD-10-CM | POA: Diagnosis present

## 2015-05-04 DIAGNOSIS — F1010 Alcohol abuse, uncomplicated: Secondary | ICD-10-CM | POA: Diagnosis not present

## 2015-05-04 DIAGNOSIS — E785 Hyperlipidemia, unspecified: Secondary | ICD-10-CM | POA: Diagnosis not present

## 2015-05-04 DIAGNOSIS — M8580 Other specified disorders of bone density and structure, unspecified site: Secondary | ICD-10-CM | POA: Diagnosis not present

## 2015-05-04 DIAGNOSIS — R590 Localized enlarged lymph nodes: Secondary | ICD-10-CM | POA: Diagnosis not present

## 2015-05-05 ENCOUNTER — Ambulatory Visit: Payer: Medicare Other

## 2015-05-05 NOTE — Progress Notes (Signed)
  Radiation Oncology         [phone] ________________________________  Name: Tammie Clay MRN: [id number]  Date: 05/04/2015  DOB: [birth year]  End of Treatment Note   ICD-9-CM ICD-10-CM    1. 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   Pain improvement at the treatment site.  Plan: The patient has completed radiation treatment. The patient will return to radiation oncology clinic for routine followup in one month. I advised them to call or return sooner if they have any questions or concerns related to their recovery or treatment.  -----------------------------------  Blair Promise, PhD, MD

## 2015-05-06 ENCOUNTER — Other Ambulatory Visit: Payer: Medicare Other

## 2015-05-06 ENCOUNTER — Other Ambulatory Visit: Payer: Self-pay | Admitting: Medical Oncology

## 2015-05-12 ENCOUNTER — Ambulatory Visit (HOSPITAL_BASED_OUTPATIENT_CLINIC_OR_DEPARTMENT_OTHER): Payer: Medicare Other | Admitting: Internal Medicine

## 2015-05-12 ENCOUNTER — Encounter: Payer: Self-pay | Admitting: Internal Medicine

## 2015-05-12 ENCOUNTER — Telehealth: Payer: Self-pay | Admitting: Internal Medicine

## 2015-05-12 VITALS — BP 106/69 | HR 95 | Temp 98.1°F | Resp 18 | Ht 62.0 in | Wt 134.0 lb

## 2015-05-12 DIAGNOSIS — M859 Disorder of bone density and structure, unspecified: Secondary | ICD-10-CM

## 2015-05-12 DIAGNOSIS — C3412 Malignant neoplasm of upper lobe, left bronchus or lung: Secondary | ICD-10-CM | POA: Diagnosis not present

## 2015-05-12 DIAGNOSIS — C3492 Malignant neoplasm of unspecified part of left bronchus or lung: Secondary | ICD-10-CM

## 2015-05-12 DIAGNOSIS — I2699 Other pulmonary embolism without acute cor pulmonale: Secondary | ICD-10-CM

## 2015-05-12 DIAGNOSIS — Z5111 Encounter for antineoplastic chemotherapy: Secondary | ICD-10-CM

## 2015-05-12 DIAGNOSIS — C7951 Secondary malignant neoplasm of bone: Secondary | ICD-10-CM | POA: Diagnosis not present

## 2015-05-12 DIAGNOSIS — K769 Liver disease, unspecified: Secondary | ICD-10-CM

## 2015-05-12 NOTE — Telephone Encounter (Signed)
Ave and printed appt shced and avs for pt for May gv barium....the patient requested 11 appt on wed...gv what was availabile

## 2015-05-12 NOTE — Progress Notes (Signed)
[place] [phone]   Fax:([phone]) [place] [address] [place] [address]  DIAGNOSIS:  1) Stage IV (T2, N2, M1a) non-small cell lung cancer, squamous cell carcinoma presented with central left upper lobe lung mass with associated postobstructive atelectasis as well as ipsilateral and subcarinal lymphadenopathy as well as pleural tumor spread diagnosed in October 2015. PDL1 expression: 0%. 2) subsegmental acute pulmonary thromboembolism in the right lower lobe diagnosed in March 2017.  PRIOR THERAPY:  1) Systemic chemotherapy with carboplatin for AUC of 5 on day 1 and Abraxane 100 MG/M2 on days 1, 8 and 15 every 3 weeks. Status post 6 cycles.  2) Nivolumab per research protocol BMS 370 group B, arm A, given every 2 weeks. Status post 7 cycles. Last dose was given 08/13/2014 discontinued on 08/28/2014 secondary to disease progression. 3) Systemic chemotherapy with docetaxel 75 MG/M2 and Cyramza 10 MG/KG every 3 weeks. First dose 09/10/2014. Status post 6 cycles. Discontinued secondary to disease progression. 4) Systemic chemotherapy with carboplatin for AUC of 5 on day 1 and gemcitabine 1000 MG/M2 on days 1 and 8 every 3 weeks, status post 3 cycles, discontinued secondary to entrance. 5) Systemic chemotherapy with single agent gemcitabine 800 MG/M2 on days 1 and 8 every 3 weeks. First cycle 04/01/2015. Status post 1 dose. Discontinued secondary to disease progression. 6) palliative radiotherapy to the lytic bone lesion in the left sixth rib under the care of Dr. Sondra Come.  CURRENT THERAPY: Xarelto 15 mg by mouth twice a day for 3 weeks followed by 20 mg by mouth daily..  INTERVAL HISTORY: Tammie Clay 70 y.o. female returns to the clinic today for follow-up visit accompanied by her boyfriend and daughter. The patient was recently admitted to [facility] with worsening dyspnea and CT  angiogram of the chest showed evidence for pulmonary embolism. She was started on Xarelto and feeling better. The CT scan of the chest also showed evidence for disease progression with questionable lymphangitic spread in addition to bilateral pulmonary nodules. She is feeling much better today. She continues to have shortness breath with exertion and currently on home oxygen but her oxygen saturation is 99% on 2 L. She denied having any weight loss or night sweats. The patient denied having any chest pain, cough or hemoptysis. She has no bleeding issues. She has no nausea or vomiting and no fever or chills. She recently completed a course of palliative radiotherapy to the metastatic bone lesion in the left sixth rib. She is here today for evaluation and discussion of her treatment options.  MEDICAL HISTORY: Past Medical History  Diagnosis Date  . Hypertension   . COPD (chronic obstructive pulmonary disease) ([place])   . Hyperlipidemia   . GERD (gastroesophageal reflux disease)   . Osteopenia   . Insomnia   . Mediastinal lymphadenopathy 11/19/13    PER CT  . Multiple lung nodules on CT 11/19/13    LEFT LUNG BASE  . Lung mass 11/19/13    RUL PER CT  . Coronary artery calcification seen on CAT scan 11/21/2013  . COPD (chronic obstructive pulmonary disease) ([place]) 11/21/2013  . Pneumonia     hx of  . Anxiety   . Encounter for antineoplastic chemotherapy 12/03/2014  . Chemotherapy induced neutropenia ([place]) 02/11/2015  . stage iv sq cell lung ca dx'd 11/2013    Recist     ALLERGIES:  is allergic to pollen  extract.  MEDICATIONS:  Current Outpatient Prescriptions  Medication Sig Dispense Refill  . acetaminophen (TYLENOL) 325 MG tablet Take 650 mg by mouth every 6 (six) hours as needed for moderate pain or headache. Take 2 tablets every morning    . albuterol (PROVENTIL HFA;VENTOLIN HFA) 108 (90 BASE) MCG/ACT inhaler Inhale 2 puffs into the lungs every 6 (six) hours as needed for wheezing.    .  calcium carbonate (TUMS - DOSED IN MG ELEMENTAL CALCIUM) 500 MG chewable tablet Chew 1 tablet by mouth 2 (two) times daily as needed for indigestion or heartburn.     . diphenoxylate-atropine (LOMOTIL) 2.5-0.025 MG tablet TAKE 2 TABLETS 4 TIMES A DAY AS NEEDED FOR DIARRHEA (Patient taking differently: Take 2 tablets by mouth 4 times a day as needed for diarrhea) 30 tablet 0  . docusate sodium (COLACE) 100 MG capsule Take 100 mg by mouth daily as needed for moderate constipation.     . DULoxetine (CYMBALTA) 30 MG capsule TAKE ONE CAPSULE BY MOUTH EVERY DAY (Patient taking differently: Take 1 capsule by mouth every day) 30 capsule 2  . Fexofenadine HCl (ALLEGRA ALLERGY PO) Take 1 tablet by mouth daily. Reported on 04/21/2015    . HYDROcodone-acetaminophen (NORCO) 10-325 MG per tablet Take 1 tablet by mouth 2 (two) times daily as needed for moderate pain or severe pain.     Marland Kitchen lidocaine-prilocaine (EMLA) cream Apply 1 application topically as needed. (Patient taking differently: Apply 1 application topically as needed (port access site pain for chemo treatment). ) 30 g 0  . lisinopril-hydrochlorothiazide (PRINZIDE,ZESTORETIC) 20-12.5 MG per tablet Take 2 tablets by mouth every morning.     . loratadine (CLARITIN) 10 MG tablet Take 10 mg by mouth daily.    Marland Kitchen LORazepam (ATIVAN) 2 MG tablet Take 3 tablets by mouth each day at bedtime as needed for sleep  0  . metoprolol succinate (TOPROL-XL) 50 MG 24 hr tablet Take 50 mg by mouth every morning. Take with or immediately following a meal.    . Multiple Vitamin (MULTIVITAMIN WITH MINERALS) TABS Take 1 tablet by mouth daily.    . polyethylene glycol (MIRALAX / GLYCOLAX) packet Take 17 g by mouth daily as needed for moderate constipation or severe constipation.     . predniSONE (DELTASONE) 10 MG tablet Take 63m (4 tabs) x 3 days, then taper to 38m(3 tabs) x 3 days, then 2049m2 tabs) x 3days, then 35m65m tab) x 3days, then OFF. 30 tablet 0  . prochlorperazine  (COMPAZINE) 10 MG tablet Take 1 tablet (10 mg total) by mouth every 6 (six) hours as needed for nausea or vomiting. (Patient taking differently: Take 10 mg by mouth daily as needed for nausea or vomiting. ) 30 tablet 1  . Rivaroxaban (XARELTO STARTER PACK) 15 & 20 MG TBPK Take as directed on package: Start with one 15mg58mlet by mouth twice a day with food. On Day 22, switch to one 20mg 53met once a day with food. 51 each 0  . SYMBICORT 160-4.5 MCG/ACT inhaler Inhale 2 puffs into the lungs two times a day  10  . TRAZODONE HCL PO Take 2 tablets by mouth at bedtime.    . Wound Dressings (SONAFINE EX) Apply topically.     No current facility-administered medications for this visit.    SURGICAL HISTORY:  Past Surgical History  Procedure Laterality Date  .  c section x 2    . Urethral cyst removed    . Tonsillectomy    .  Appendectomy    . Colonoscopy with propofol N/A 07/17/2012    Procedure: COLONOSCOPY WITH PROPOFOL;  Surgeon: Garlan Fair, MD;  Location: [facility];  Service: Endoscopy;  Laterality: N/A;  . Esophagogastroduodenoscopy (egd) with propofol N/A 07/17/2012    Procedure: ESOPHAGOGASTRODUODENOSCOPY (EGD) WITH PROPOFOL;  Surgeon: Garlan Fair, MD;  Location: [facility];  Service: Endoscopy;  Laterality: N/A;  . Video bronchoscopy with endobronchial ultrasound N/A 12/03/2013    Procedure: VIDEO BRONCHOSCOPY WITH ENDOBRONCHIAL ULTRASOUND WITH BIOPSIES;  Surgeon: Grace Isaac, MD;  Location: [place];  Service: Thoracic;  Laterality: N/A;  . Portacath placement Left 12/10/2013    Procedure: INSERTION PORT-A-CATH;  Surgeon: Grace Isaac, MD;  Location: [place];  Service: Thoracic;  Laterality: Left;    REVIEW OF SYSTEMS:  Constitutional: positive for fatigue Eyes: negative Ears, nose, mouth, throat, and face: negative Respiratory: positive for dyspnea on exertion Cardiovascular: negative Gastrointestinal: negative Genitourinary:negative Integument/breast:  negative Hematologic/lymphatic: negative Musculoskeletal:negative Neurological: negative Behavioral/Psych: negative Endocrine: negative Allergic/Immunologic: negative   PHYSICAL EXAMINATION: General appearance: alert, cooperative, fatigued and no distress Head: Normocephalic, without obvious abnormality, atraumatic Neck: no adenopathy, no JVD, supple, symmetrical, trachea midline and thyroid not enlarged, symmetric, no tenderness/mass/nodules Lymph nodes: Cervical, supraclavicular, and axillary nodes normal. Resp: clear to auscultation bilaterally Back: symmetric, no curvature. ROM normal. No CVA tenderness. Cardio: regular rate and rhythm, S1, S2 normal, no murmur, click, rub or gallop GI: soft, non-tender; bowel sounds normal; no masses,  no organomegaly Genitalia: defer exam Extremities: extremities normal, atraumatic, no cyanosis or edema Neurologic: Alert and oriented X 3, normal strength and tone. Normal symmetric reflexes. Normal coordination and gait  ECOG PERFORMANCE STATUS: 1 - Symptomatic but completely ambulatory  Blood pressure 106/69, pulse 95, temperature 98.1 F (36.7 C), temperature source Oral, resp. rate 18, height 5' 2"  (1.575 m), weight 134 lb (60.782 kg), SpO2 99 %.  LABORATORY DATA: Lab Results  Component Value Date   WBC 11.5* 05/01/2015   HGB 8.6* 05/01/2015   HCT 27.2* 05/01/2015   MCV 97.8 05/01/2015   PLT 195 05/01/2015      Chemistry      Component Value Date/Time   NA 138 05/01/2015 0438   NA 137 04/15/2015 1319   K 4.8 05/01/2015 0438   K 4.5 04/15/2015 1319   CL 98* 05/01/2015 0438   CO2 31 05/01/2015 0438   CO2 28 04/15/2015 1319   BUN 45* 05/01/2015 0438   BUN 40.4* 04/15/2015 1319   CREATININE 1.03* 05/01/2015 0438   CREATININE 1.2* 04/15/2015 1319   CREATININE 0.80 11/22/2013 1054      Component Value Date/Time   CALCIUM 9.3 05/01/2015 0438   CALCIUM 9.2 04/15/2015 1319   ALKPHOS 84 04/29/2015 0440   ALKPHOS 86 04/15/2015 1319    AST 49* 04/29/2015 0440   AST 13 04/15/2015 1319   ALT 80* 04/29/2015 0440   ALT 16 04/15/2015 1319   BILITOT 0.4 04/29/2015 0440   BILITOT 0.45 04/15/2015 1319       RADIOGRAPHIC STUDIES: Dg Chest 2 View  04/28/2015  CLINICAL DATA:  Shortness of breath for 3 days. History of previous lung carcinoma EXAM: CHEST  2 VIEW COMPARISON:  April 04, 2015 chest radiograph and chest CT March 30, 2015 FINDINGS: There is scarring with volume loss on the left, stable. The mass in the medial aspect of the left upper lobe seen on recent CT is not well visualized by radiography, as was the case on prior chest radiograph. No  new opacity is evident. The heart size is normal. Port-A-Cath tip is in the superior vena cava. Adenopathy seen on CT is not well seen radiographically. There is a destructive lesion in the lateral left sixth rib, not appreciably changed. No new rib lesions identified. IMPRESSION: Postoperative change with scarring on the left. The known mass in the medial left upper lobe region is not well seen radiographically. There is no change in cardiac silhouette. There is a stable destructive lesion in the lateral left sixth rib. No new opacity. Electronically Signed   By: Lowella Grip III M.D.   On: 04/28/2015 09:40   Ct Angio Chest Pe W/cm &/or Wo Cm  04/28/2015  CLINICAL DATA:  Short of breath for 2 weeks EXAM: CT ANGIOGRAPHY CHEST WITH CONTRAST TECHNIQUE: Multidetector CT imaging of the chest was performed using the standard protocol during bolus administration of intravenous contrast. Multiplanar CT image reconstructions and MIPs were obtained to evaluate the vascular anatomy. CONTRAST:  77m OMNIPAQUE IOHEXOL 350 MG/ML SOLN COMPARISON:  03/30/2015 FINDINGS: There is subsegmental pulmonary thromboembolism in the right lower lobe. Thrombus is present and at least 3 subsegmental branches on image 64 of series 4. No large central pulmonary thromboembolism. Atherosclerotic changes of the  aortic arch and coronary arteries. Great vessels are grossly patent. No aortic dissection or aneurysm. Stable mass along the left anterior mediastinum on image 40. 14 mm AP window node on image 30 is stable. Stable right precarinal node on image 33. Stable subcarinal node on image 37. Emphysema is present. Right upper lobe pulmonary nodule on image 9 has increased from 5 mm to 6 mm. Anterior right upper lobe nodule on image 17 measures 9 mm and has increased in size. Left lower lobe pulmonary nodule on image 49 measures 8 mm and previously was 7 mm. Right lower lobe pulmonary nodule on image 38 is larger. Innumerable nodules towards the base of the right lower lobe have increased in number and size compatible with progression of disease. Interlobular septal thickening in the left upper lobe extending from the mass is worse suggesting at their post obstructive pneumonitis or lymphangitic spread of tumor. Destructive lesion involving the left sixth rib with erosion of the adjacent fifth rib is stable. Sclerotic foci in the sternum are stable. Small left pleural effusion is larger. Left subclavian Port-A-Cath is stable. Liver lesions are grossly unchanged. Review of the MIP images confirms the above findings. IMPRESSION: The study is positive for subsegmental acute pulmonary thromboembolism in the right lower lobe. Critical Value/emergent results were called by telephone at the time of interpretation on 04/28/2015 at 2:30 pm to Dr. NDavonna Belling, who verbally acknowledged these results. Stable left upper lobe mass and stable mediastinal adenopathy. There is increasing interstitial lung disease adjacent to the mass in the left upper lobe either due to pneumonitis or lymphangitic spread of tumor. Pulmonary nodules have increased in number and size compatible with progression of disease. Left pleural effusion Stable left rib and sternal lesions. Liver lesions are grossly unchanged. Electronically Signed   By: AMarybelle KillingsM.D.   On: 04/28/2015 14:32    ASSESSMENT AND PLAN: This is a very pleasant 70years old white female recently diagnosed with:  1) Stage IV non-small cell lung cancer, squamous cell carcinoma completed systemic chemotherapy with carboplatin and Abraxane status post 6 cycles.  She is currently on treatment with immunotherapy according to the BMS checkmate 370 clinical trial with Nivolumab.she status post 7 cycles and tolerating her last treatment  fairly well with no significant diarrhea. Her treatment was also discontinued secondary to disease progression. The patient was started on systemic chemotherapy with docetaxel and surrounds is status post 6 cycles and tolerating her treatment well except for fatigue and pain from the Neulasta injection. This treatment was discontinued secondary to disease progression. The patient is currently undergoing systemic chemotherapy with carboplatin for AUC of 5 on day 1 and gemcitabine 1000 MG/M2 on days 1 and 8 every 3 weeks is status post 3 cycles. She tolerated the last cycle of her treatment well except for persistent fatigue fatigue. The recent CT scan of the chest, abdomen and pelvis showed mixed response with mild progression of disease with some new and mildly enlarged pulmonary nodules and enlarging lytic lesion in the left sixth rib and the new 5 mm hypodense lesion in the liver. The patient underwent palliative radiotherapy to the metastatic bone lesion in the left sixth rib. She has mild improvement in her pain. Unfortunately his recent CT angiogram of the chest on 04/28/2015 showed evidence for disease progression with questionable lymphangitic spread and new bilateral pulmonary nodules. I discussed the scan results with the patient and her family. I recommended for her to discontinue treatment with gemcitabine at this point. I discussed with the patient several options for treatment of her condition including palliative care and hospice referral  versus consideration of treatment with single agent chemotherapy with Navelbine or Abraxane versus oral therapy with Gilotrif. The patient is still interested in treatment but she would like to delay her treatment until May 2017. I will arrange for the patient to have repeat CT scan of the chest, abdomen and pelvis before her visit in May for restaging of her disease. 2) newly diagnosed right lower lobe pulmonary embolism: The patient will continue treatment with Xarelto. She will come back for follow-up visit in one month. The patient was advised to call immediately if she has any concerning symptoms in the interval.  The patient voices understanding of current disease status and treatment options and is in agreement with the current care plan.  All questions were answered. The patient knows to call the clinic with any problems, questions or concerns. We can certainly see the patient much sooner if necessary.  Disclaimer: This note was dictated with voice recognition software. Similar sounding words can inadvertently be transcribed and may not be corrected upon review.

## 2015-05-25 ENCOUNTER — Encounter: Payer: Self-pay | Admitting: Medical Oncology

## 2015-05-25 DIAGNOSIS — C3492 Malignant neoplasm of unspecified part of left bronchus or lung: Secondary | ICD-10-CM

## 2015-05-25 NOTE — Progress Notes (Signed)
BMS 370. Group B, Arm A. 6 month follow-up I called patient at home to follow up with her. Patient is in follow-up with study and not on any active treatment at this time. I completed PRO's with patient over the phone. Patient reports not to be doing so well. Patient states she is extremely fatigued and is now on 4L of oxygen at home. She denies having any pain at this time. Patient did not wish to talk long but was willing to complete the 6 questions related to [id number]. Patient informed me that she has not decided as to whether she will continue with chemotherapy. States that she is waiting to hear from Dr. [name] regarding a referral to palliative care. Patient did have a couple of hospital admissions since her 3 month follow-up. Patient has completed radiation treatment on 05/04/2015 to the left 6th rib. All patient's questions answered, she denies any questions at this time. She is asking for me to contact Dr. [name] regarding the referral to palliative. Message sent to him. I thanked patient for her time and encouraged her to contact Dr. [name] or myself with any questions or concerns she may have.  [name], RN, BSN. Clinical Research 05/25/2015 4:32 PM

## 2015-05-26 ENCOUNTER — Telehealth: Payer: Self-pay | Admitting: *Deleted

## 2015-05-26 NOTE — Telephone Encounter (Signed)
Called pt and confirmed she would like a referral to hospice. We discussed what hospice will assist her with and they will be calling her to set up a date and time to visit at her home. Pt denied needing any further assistance.  Called [facility] and placed referral.

## 2015-05-26 NOTE — Telephone Encounter (Signed)
-----   Message from Curt Bears, MD sent at 05/25/2015  5:03 PM EDT ----- Regarding: RE: Palliative referral OK to refer her to palliative care with [facility] and palliative care. ----- Message -----    From: Lucile Crater, RN    Sent: 05/25/2015   4:57 PM      To: Curt Bears, MD, Maxwell Marion, RN Subject: RE: Palliative referral                        Rubin Payor, I received a call from [facility] stating the RN who went to see pt referred pt to Pallative care due to shortness of breath and unable to get out of bed. Is this something she requested?  Thanks, Stanton Kidney    ----- Message -----    From: Maxwell Marion, RN    Sent: 05/25/2015   4:33 PM      To: Lucile Crater, RN, Curt Bears, MD, # Subject: Palliative referral                            Dr. [name],   I called Mrs. Hoadley at home to follow-up with her, 6 months follow up beyond progression, regarding BMS 370.  Patient states she is waiting to here from you/desk nurse regarding a palliative care referral. She asked me to follow up with you to see when she will here from someone regarding this.   Thank you, [place]

## 2015-05-28 ENCOUNTER — Telehealth: Payer: Self-pay | Admitting: Medical Oncology

## 2015-05-28 NOTE — Telephone Encounter (Signed)
Tammie Clay declined hospice services

## 2015-05-28 NOTE — Telephone Encounter (Signed)
I called palliative care service and asked RN to contact Hospice nurse to see if pt  requests  palliative  Care.

## 2015-05-29 ENCOUNTER — Telehealth: Payer: Self-pay

## 2015-05-29 NOTE — Telephone Encounter (Signed)
Pt called making sure her upcoming appts had not been cancelled. This concern was d/t initiation of conversation about palliative care. Assured her appts were still in place. Explained that an initial visit from palliative care would be useful for her to gain information about their services and what they can do for her.

## 2015-05-29 NOTE — Telephone Encounter (Signed)
Dr Konrad Dolores with hospice called to talk with Dr [name] about the referral to hospice. The pt did refuse hospice and also palliative care at this time. She did not allow them to come to home to discuss services they could provide. Dr Konrad Dolores was asking if there was any way we can facilitate conversation between the pt and palliative care. He did mention any phone calls would come over caller ID as "hospice' whether it was hospice or palliative. The pt had mentioned in prior phone call that she still wanted to have Dr [name] as her MD, she does not want the hospice MD to be her MD. This would need to be addressed with the pt. Dr Konrad Dolores said he is still available for the patient at the appropriate time.

## 2015-06-08 ENCOUNTER — Encounter: Payer: Self-pay | Admitting: Oncology

## 2015-06-08 ENCOUNTER — Telehealth: Payer: Self-pay | Admitting: *Deleted

## 2015-06-08 MED ORDER — METHYLPREDNISOLONE 4 MG PO TBPK: ORAL_TABLET | ORAL | Status: AC

## 2015-06-08 NOTE — Telephone Encounter (Signed)
Call from [place], [place] at [facility] who request pt be given something for her appetite as she is not eating. Pt also requested MD appt to review scan be moved up to a sooner date than 5/17 to review scan results. Reviewed wwith MD, Medrol Dose pak sent to pt's pharmacy. POF to scheduling to try and get pt earlier appt with MD Called pt with above information. Unable to reach, lmovm.

## 2015-06-09 ENCOUNTER — Telehealth: Payer: Self-pay | Admitting: Internal Medicine

## 2015-06-09 NOTE — Telephone Encounter (Signed)
returned call and s.w pt husband..pt was sleep.Marland Kitchenadvised him to tell her to call us back.Marland KitchenMarland KitchenMarland Kitchen

## 2015-06-11 ENCOUNTER — Ambulatory Visit
Admit: 2015-06-11 | Discharge: 2015-06-11 | Disposition: A | Discharge status: Home / Self Care | Payer: Medicare Other | Attending: Radiation Oncology | Admitting: Radiation Oncology

## 2015-06-11 VITALS — BP 145/87 | HR 103 | Temp 98.4°F | Ht 62.0 in | Wt 134.0 lb

## 2015-06-11 DIAGNOSIS — C3492 Malignant neoplasm of unspecified part of left bronchus or lung: Secondary | ICD-10-CM

## 2015-06-11 NOTE — Progress Notes (Addendum)
Tammie Clay here for follow up.  She reports having occasional pain in her left lower back that radiates across her abdomen.  She said this started 2 weeks ago.  She mentioned that her home health nurse has noticed that her left lower lung does not sound like it has any air movement.  She does report the pain in her left rib is gone.  She is using 3 L of oxygen at home.  She will have a CT Scan on 06/18/15.  She reports her energy level "is about the same."  BP 145/87 mmHg  Pulse 103  Temp(Src) 98.4 F (36.9 C) (Oral)  Ht '5\' 2"'$  (1.575 m)  Wt 134 lb (60.782 kg)  BMI 24.50 kg/m2  SpO2 100%   Wt Readings from Last 3 Encounters:  06/11/15 134 lb (60.782 kg)  05/12/15 134 lb (60.782 kg)  04/28/15 140 lb 1.6 oz (63.549 kg)

## 2015-06-11 NOTE — Progress Notes (Signed)
Radiation Oncology         [phone] ________________________________  Name: Tammie Clay MRN: [id number]  Date: 06/11/2015  DOB: 09-17-1945  Follow-Up Visit Note  CC: Mayra Neer, MD  Curt Bears, MD    ICD-9-CM ICD-10-CM   1. 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   Interval Since Last Radiation:  5 weeks. Completed radiation 04/20/2015-05/04/2015 to the left 6th rib using 3-D conformal, 10 x beams.  Narrative:  The patient returns today for routine follow-up. She reports having occasional pain in her left lower back that radiates across her abdomen. She said this started 2 weeks ago. She mentioned that her home health nurse has noticed that her left lower lung does not sound like it has any air movement. She does report the pain in her left rib is gone. She is using 3 L of oxygen at home. She reports her energy level "is about the same."  ALLERGIES:  is allergic to pollen extract.  Meds: Current Outpatient Prescriptions  Medication Sig Dispense Refill  . acetaminophen (TYLENOL) 325 MG tablet Take 650 mg by mouth every 6 (six) hours as needed for moderate pain or headache. Take 2 tablets every morning    . albuterol (PROVENTIL HFA;VENTOLIN HFA) 108 (90 BASE) MCG/ACT inhaler Inhale 2 puffs into the lungs every 6 (six) hours as needed for wheezing.    . calcium carbonate (TUMS - DOSED IN MG ELEMENTAL CALCIUM) 500 MG chewable tablet Chew 1 tablet by mouth 2 (two) times daily as needed for indigestion or heartburn.     . DULoxetine (CYMBALTA) 30 MG capsule TAKE ONE CAPSULE BY MOUTH EVERY DAY (Patient taking differently: Take 1 capsule by mouth every day) 30 capsule 2  . HYDROcodone-acetaminophen (NORCO) 10-325 MG per tablet Take 1 tablet by mouth 2 (two) times daily as needed for moderate pain or severe pain.     Marland Kitchen lidocaine-prilocaine (EMLA)  cream Apply 1 application topically as needed. (Patient taking differently: Apply 1 application topically as needed (port access site pain for chemo treatment). ) 30 g 0  . loratadine (CLARITIN) 10 MG tablet Take 10 mg by mouth daily.    Marland Kitchen LORazepam (ATIVAN) 2 MG tablet Take 3 tablets by mouth each day at bedtime as needed for sleep  0  . metoprolol succinate (TOPROL-XL) 50 MG 24 hr tablet Take 50 mg by mouth every morning. Take with or immediately following a meal.    . Multiple Vitamin (MULTIVITAMIN WITH MINERALS) TABS Take 1 tablet by mouth daily.    . Rivaroxaban (XARELTO STARTER PACK) 15 & 20 MG TBPK Take as directed on package: Start with one '15mg'$  tablet by mouth twice a day with food. On Day 22, switch to one '20mg'$  tablet once a day with food. 51 each 0  . SYMBICORT 160-4.5 MCG/ACT inhaler Inhale 2 puffs into the lungs two times a day  10  . TRAZODONE HCL PO Take 2 tablets by mouth at bedtime.    . diphenoxylate-atropine (LOMOTIL) 2.5-0.025 MG tablet TAKE 2 TABLETS 4 TIMES A DAY AS NEEDED FOR DIARRHEA (Patient not taking: Reported on 06/11/2015) 30 tablet 0  . docusate sodium (COLACE) 100 MG capsule Take 100 mg by mouth daily as needed for moderate constipation. Reported on 06/11/2015    . lisinopril-hydrochlorothiazide (PRINZIDE,ZESTORETIC) 20-12.5 MG per tablet Take 2 tablets by mouth every  morning. Reported on 06/11/2015    . methylPREDNISolone (MEDROL DOSEPAK) 4 MG TBPK tablet Take as directed (Patient not taking: Reported on 06/11/2015) 21 tablet 0  . polyethylene glycol (MIRALAX / GLYCOLAX) packet Take 17 g by mouth daily as needed for moderate constipation or severe constipation. Reported on 06/11/2015    . predniSONE (DELTASONE) 10 MG tablet Take '40mg'$  (4 tabs) x 3 days, then taper to '30mg'$  (3 tabs) x 3 days, then '20mg'$  (2 tabs) x 3days, then '10mg'$  (1 tab) x 3days, then OFF. (Patient not taking: Reported on 06/11/2015) 30 tablet 0  . prochlorperazine (COMPAZINE) 10 MG tablet Take 1 tablet (10 mg total)  by mouth every 6 (six) hours as needed for nausea or vomiting. (Patient not taking: Reported on 06/11/2015) 30 tablet 1   No current facility-administered medications for this encounter.    Physical Findings: The patient is in no acute distress. Patient is alert and oriented.  height is '5\' 2"'$  (1.575 m) and weight is 134 lb (60.782 kg). Her oral temperature is 98.4 F (36.9 C). Her blood pressure is 145/87 and her pulse is 103. Her oxygen saturation is 100%. .  No significant changes. She has decreased breath sounds in the left lower lung field, otherwise clear. The heart has a regular rhythm and rate. No palpable subclavicular or axillary adenopathy. No inguinal adenopathy is appreciated.   Lab Findings: Lab Results  Component Value Date   WBC 11.5* 05/01/2015   HGB 8.6* 05/01/2015   HCT 27.2* 05/01/2015   MCV 97.8 05/01/2015   PLT 195 05/01/2015    Radiographic Findings: No results found.  Impression:  The patient is recovering from the effects of radiation. Good palliation of the patient's left rib pain.    Plan:  She has a CT scan scheduled 06/18/2015. She will continue to follow up with Dr. [name]. I will follow up with her on an as needed basis.  ____________________________________  Blair Promise, PhD, MD    This document serves as a record of services personally performed by Gery Pray, MD. It was created on his behalf by Lendon Collar, a trained medical scribe. The creation of this record is based on the scribe's personal observations and the provider's statements to them. This document has been checked and approved by the attending provider.

## 2015-06-13 ENCOUNTER — Other Ambulatory Visit: Payer: Self-pay | Admitting: Internal Medicine

## 2015-06-17 ENCOUNTER — Other Ambulatory Visit (HOSPITAL_BASED_OUTPATIENT_CLINIC_OR_DEPARTMENT_OTHER): Payer: Medicare Other

## 2015-06-17 DIAGNOSIS — C3492 Malignant neoplasm of unspecified part of left bronchus or lung: Secondary | ICD-10-CM

## 2015-06-17 DIAGNOSIS — C3412 Malignant neoplasm of upper lobe, left bronchus or lung: Secondary | ICD-10-CM

## 2015-06-17 DIAGNOSIS — C7951 Secondary malignant neoplasm of bone: Secondary | ICD-10-CM

## 2015-06-17 DIAGNOSIS — Z5111 Encounter for antineoplastic chemotherapy: Secondary | ICD-10-CM

## 2015-06-17 LAB — COMPREHENSIVE METABOLIC PANEL
ALK PHOS: 75 U/L (ref 40–150)
ALT: 21 U/L (ref 0–55)
ANION GAP: 8 meq/L (ref 3–11)
AST: 16 U/L (ref 5–34)
Albumin: 3.2 g/dL — ABNORMAL LOW (ref 3.5–5.0)
BILIRUBIN TOTAL: 0.39 mg/dL (ref 0.20–1.20)
BUN: 23.4 mg/dL (ref 7.0–26.0)
CALCIUM: 9.7 mg/dL (ref 8.4–10.4)
CO2: 31 mEq/L — ABNORMAL HIGH (ref 22–29)
Chloride: 99 mEq/L (ref 98–109)
Creatinine: 0.9 mg/dL (ref 0.6–1.1)
EGFR: 66 mL/min/{1.73_m2} — AB (ref >90)
Glucose: 109 mg/dl (ref 70–140)
POTASSIUM: 4 meq/L (ref 3.5–5.1)
Sodium: 139 mEq/L (ref 136–145)
Total Protein: 6.6 g/dL (ref 6.4–8.3)

## 2015-06-17 LAB — CBC WITH DIFFERENTIAL/PLATELET
BASO%: 0.3 % (ref 0.0–2.0)
BASOS ABS: 0 10*3/uL (ref 0.0–0.1)
EOS%: 1.1 % (ref 0.0–7.0)
Eosinophils Absolute: 0.1 10*3/uL (ref 0.0–0.5)
HEMATOCRIT: 33.4 % — AB (ref 34.8–46.6)
HGB: 10.5 g/dL — ABNORMAL LOW (ref 11.6–15.9)
LYMPH#: 1 10*3/uL (ref 0.9–3.3)
LYMPH%: 7.9 % — AB (ref 14.0–49.7)
MCH: 29.1 pg (ref 25.1–34.0)
MCHC: 31.3 g/dL — ABNORMAL LOW (ref 31.5–36.0)
MCV: 93.1 fL (ref 79.5–101.0)
MONO#: 1.2 10*3/uL — AB (ref 0.1–0.9)
MONO%: 9.5 % (ref 0.0–14.0)
NEUT#: 10.2 10*3/uL — ABNORMAL HIGH (ref 1.5–6.5)
NEUT%: 81.2 % — ABNORMAL HIGH (ref 38.4–76.8)
PLATELETS: 304 10*3/uL (ref 145–400)
RBC: 3.59 10*6/uL — ABNORMAL LOW (ref 3.70–5.45)
RDW: 19.5 % — ABNORMAL HIGH (ref 11.2–14.5)
WBC: 12.6 10*3/uL — ABNORMAL HIGH (ref 3.9–10.3)

## 2015-06-18 ENCOUNTER — Encounter (HOSPITAL_COMMUNITY): Payer: Self-pay

## 2015-06-18 ENCOUNTER — Ambulatory Visit (HOSPITAL_COMMUNITY)
Admission: RE | Admit: 2015-06-18 | Discharge: 2015-06-18 | Disposition: A | Discharge status: Home / Self Care | Payer: Medicare Other | Source: Ambulatory Visit | Attending: Internal Medicine | Admitting: Internal Medicine

## 2015-06-18 DIAGNOSIS — C7951 Secondary malignant neoplasm of bone: Secondary | ICD-10-CM | POA: Diagnosis present

## 2015-06-18 DIAGNOSIS — J90 Pleural effusion, not elsewhere classified: Secondary | ICD-10-CM | POA: Diagnosis not present

## 2015-06-18 DIAGNOSIS — Z9221 Personal history of antineoplastic chemotherapy: Secondary | ICD-10-CM | POA: Diagnosis not present

## 2015-06-18 DIAGNOSIS — N83202 Unspecified ovarian cyst, left side: Secondary | ICD-10-CM | POA: Diagnosis not present

## 2015-06-18 DIAGNOSIS — C782 Secondary malignant neoplasm of pleura: Secondary | ICD-10-CM | POA: Diagnosis not present

## 2015-06-18 DIAGNOSIS — K5641 Fecal impaction: Secondary | ICD-10-CM | POA: Diagnosis not present

## 2015-06-18 DIAGNOSIS — D259 Leiomyoma of uterus, unspecified: Secondary | ICD-10-CM | POA: Diagnosis not present

## 2015-06-18 DIAGNOSIS — K769 Liver disease, unspecified: Secondary | ICD-10-CM | POA: Diagnosis not present

## 2015-06-18 DIAGNOSIS — I7090 Unspecified atherosclerosis: Secondary | ICD-10-CM | POA: Insufficient documentation

## 2015-06-18 DIAGNOSIS — R590 Localized enlarged lymph nodes: Secondary | ICD-10-CM | POA: Diagnosis not present

## 2015-06-18 DIAGNOSIS — Z5111 Encounter for antineoplastic chemotherapy: Secondary | ICD-10-CM

## 2015-06-18 DIAGNOSIS — I714 Abdominal aortic aneurysm, without rupture: Secondary | ICD-10-CM | POA: Insufficient documentation

## 2015-06-18 DIAGNOSIS — C3492 Malignant neoplasm of unspecified part of left bronchus or lung: Secondary | ICD-10-CM | POA: Diagnosis present

## 2015-06-18 DIAGNOSIS — D739 Disease of spleen, unspecified: Secondary | ICD-10-CM | POA: Insufficient documentation

## 2015-06-18 MED ORDER — IOPAMIDOL (ISOVUE-300) INJECTION 61%
100.0000 mL | Freq: Once | INTRAVENOUS | Status: AC | PRN
  Administered 2015-06-18: 100 mL via INTRAVENOUS

## 2015-06-22 ENCOUNTER — Other Ambulatory Visit (HOSPITAL_COMMUNITY): Payer: Self-pay | Admitting: Family Medicine

## 2015-06-22 ENCOUNTER — Ambulatory Visit (HOSPITAL_COMMUNITY)
Admission: RE | Admit: 2015-06-22 | Discharge: 2015-06-22 | Disposition: A | Discharge status: Home / Self Care | Payer: Medicare Other | Source: Ambulatory Visit | Attending: Family Medicine | Admitting: Family Medicine

## 2015-06-22 DIAGNOSIS — J90 Pleural effusion, not elsewhere classified: Secondary | ICD-10-CM

## 2015-06-24 ENCOUNTER — Ambulatory Visit: Payer: Medicare Other | Admitting: Internal Medicine

## 2015-07-08 ENCOUNTER — Telehealth: Payer: Self-pay | Admitting: *Deleted

## 2015-07-08 NOTE — Telephone Encounter (Signed)
Maura RN from [facility]osp[facility] called to advise pt has been placed on [facility]osp[facility] as of today.

## 2015-07-17 ENCOUNTER — Telehealth: Payer: Self-pay | Admitting: Medical Oncology

## 2015-07-17 NOTE — Telephone Encounter (Signed)
Staff nurse reported to me that pt died 16-Jul-2015

## 2015-07-28 ENCOUNTER — Telehealth: Payer: Self-pay | Admitting: Internal Medicine

## 2015-07-28 NOTE — Telephone Encounter (Signed)
Staff message received in HIM from desk nurse re patient's death. Patient status changed to deceased.

## 2015-07-29 ENCOUNTER — Encounter: Payer: Self-pay | Admitting: Internal Medicine

## 2015-08-08 DEATH — deceased

## 2015-08-10 ENCOUNTER — Other Ambulatory Visit: Payer: Self-pay | Admitting: Nurse Practitioner

## 2015-10-16 IMAGING — CR DG CHEST 2V
2 series · 2 of 2 positions shown · non-contrast
Comparison: 11/29/2013

CLINICAL DATA: Port-A-Cath insertion.  Lung cancer.

EXAM:
CHEST  2 VIEW

[w chest pa]
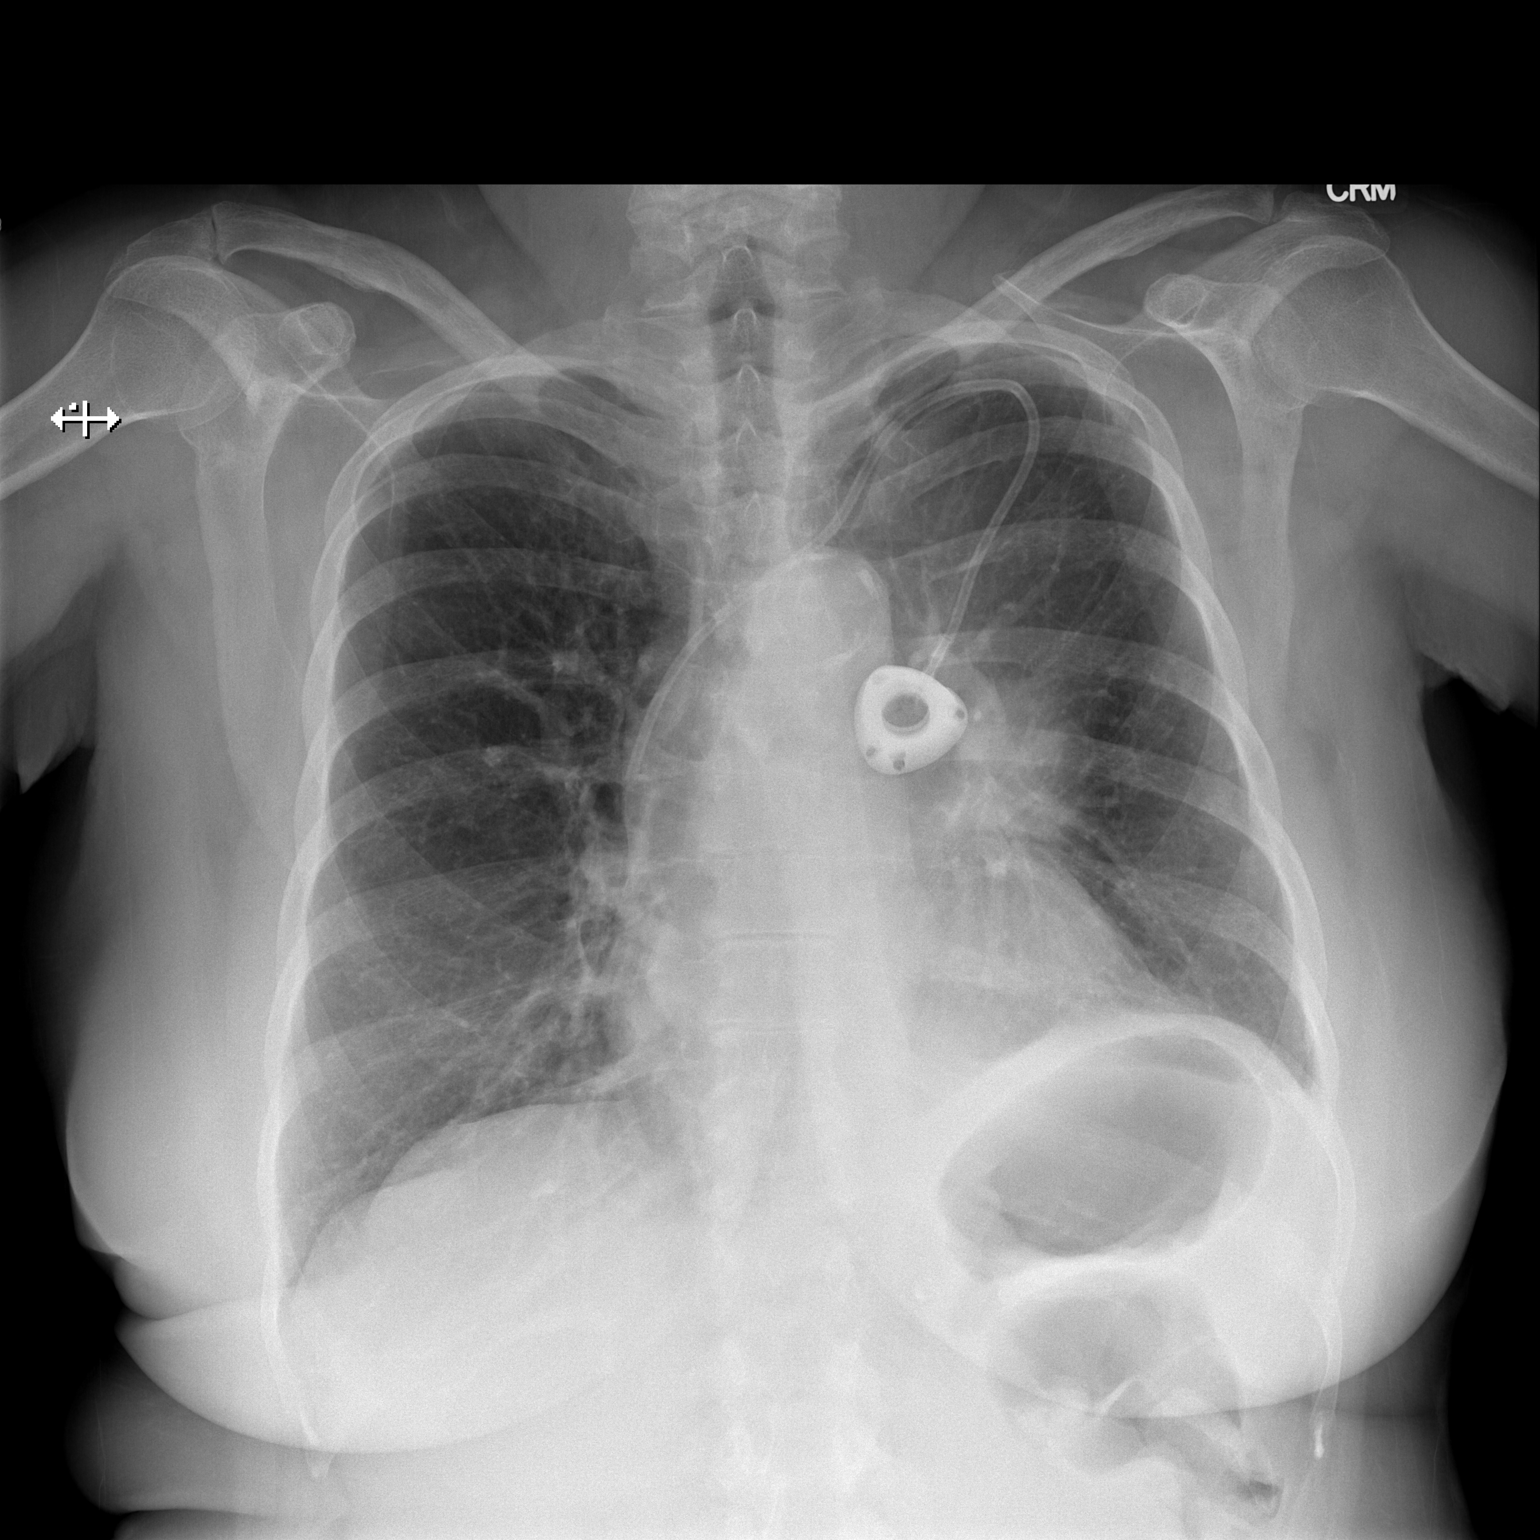

[w chest lat]
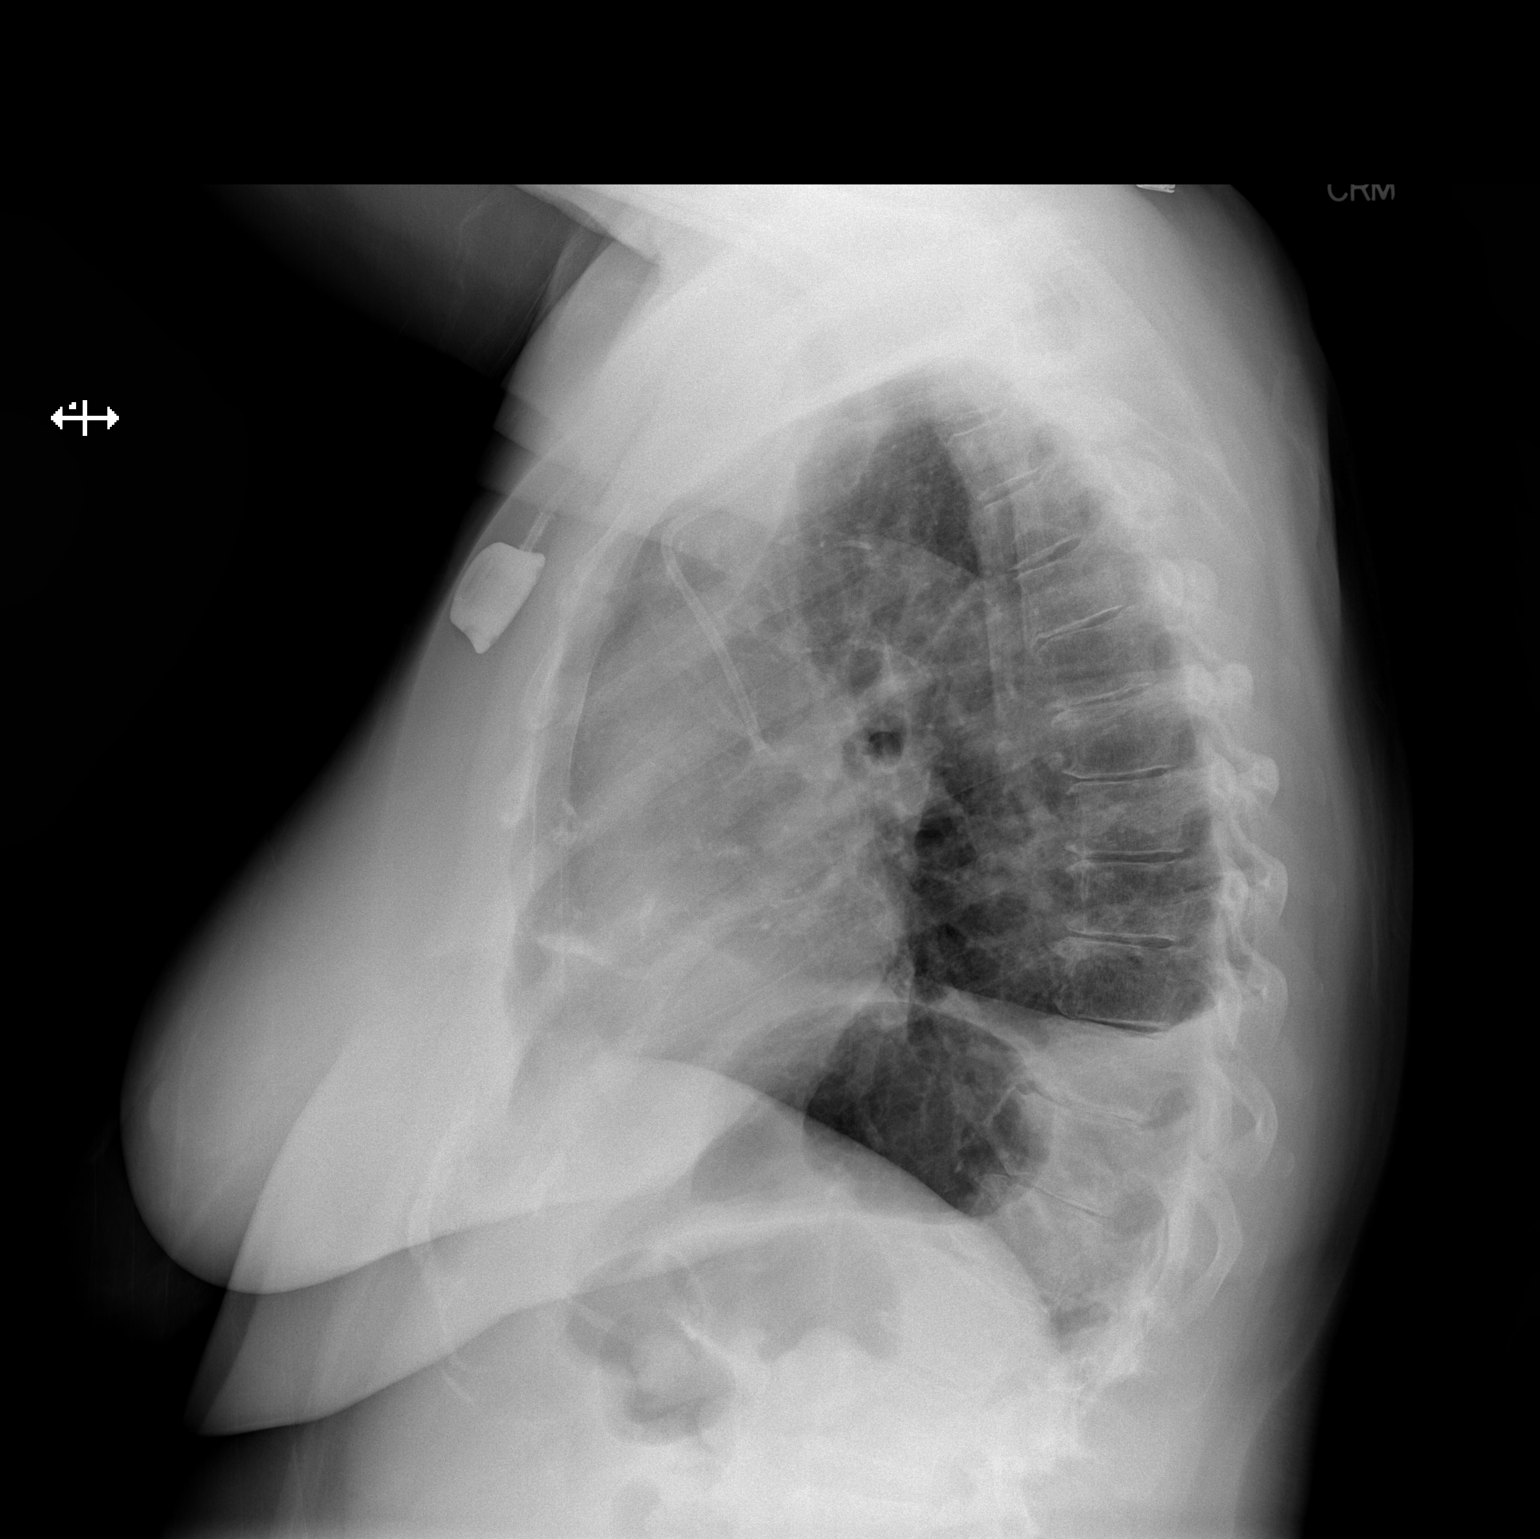

[2 of 2 positions shown; findings below may reference images not displayed]

FINDINGS: The left subclavian Port-A-Cath tip is in the mid SVC at the level
of the carina. No complicating features. The cardiac silhouette,
mediastinal and hilar contours are stable. Stable left upper lobe
mass. No pleural effusion or pneumothorax. Stable underlying
emphysematous changes and calcified granulomas.
IMPRESSION: Left subclavian Port-A-Cath tip is in the mid SVC. No complicating
features.

## 2016-07-03 IMAGING — CT CT ABD-PELV W/ CM
2 of 5 series · 14 of 46 positions shown, 16 images · IV contrast (OMNIPAQUE)
Comparison: Most recent CT chest, abdomen and pelvis from
07/03/2014.

CLINICAL DATA: Stage IV squamous cell lung carcinoma on
chemotherapy. Shortness of breath.

EXAM:
CT CHEST, ABDOMEN, AND PELVIS WITH CONTRAST
TECHNIQUE: Multidetector CT imaging of the chest, abdomen and pelvis was
performed following the standard protocol during bolus
administration of intravenous contrast.
CONTRAST:  100mL OMNIPAQUE IOHEXOL 300 MG/ML  SOLN

[Series 2: cap with st · axial · 0.71mm/px · z∈[-578,-53]mm · 11 of 120 slices shown, 13 images]
[im 8/120  soft-tissue]
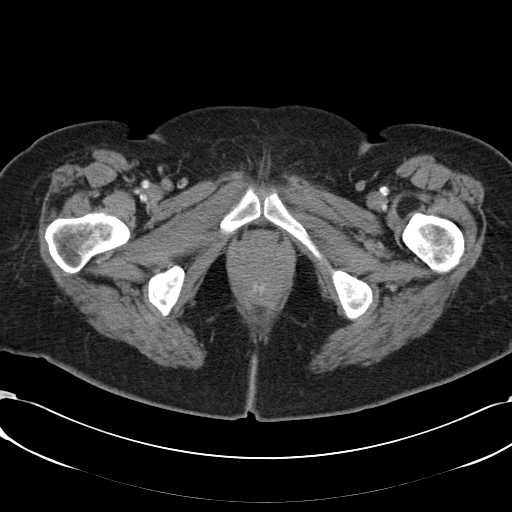
[im 8/120  bone]
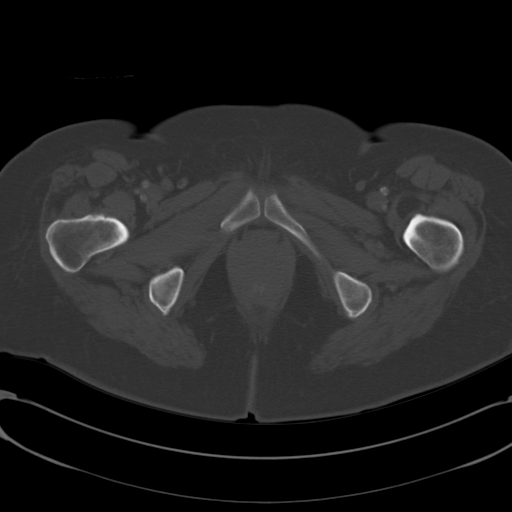
[im 22/120  soft-tissue]
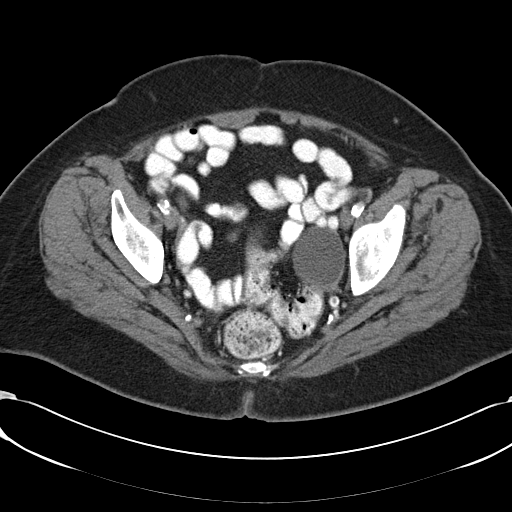
[im 29/120  soft-tissue]
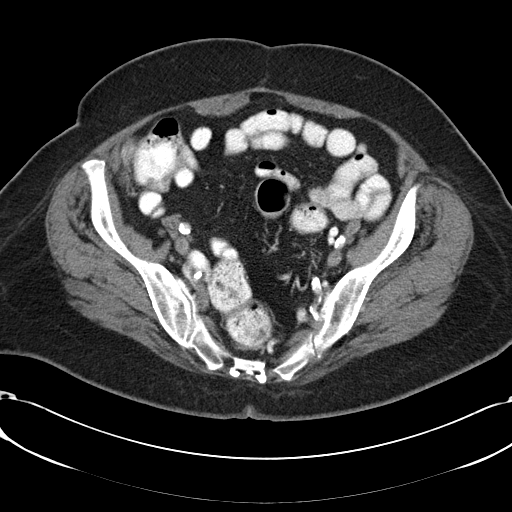
[im 43/120  soft-tissue]
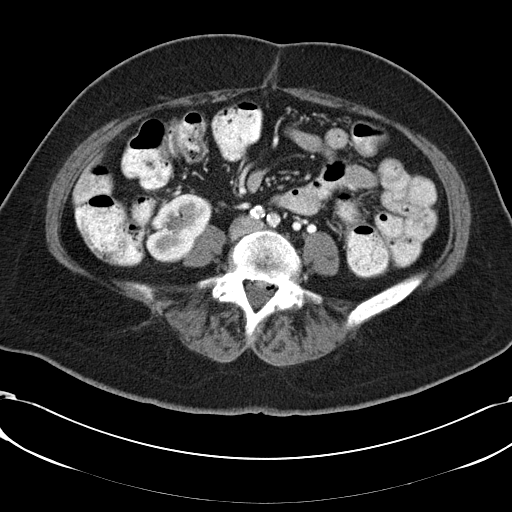
[im 50/120  soft-tissue]
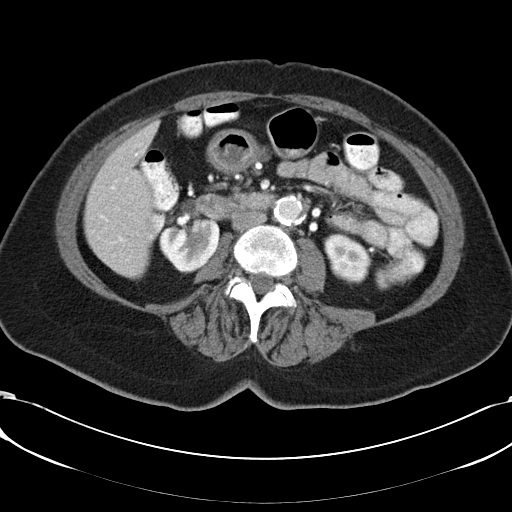
[im 64/120  soft-tissue]
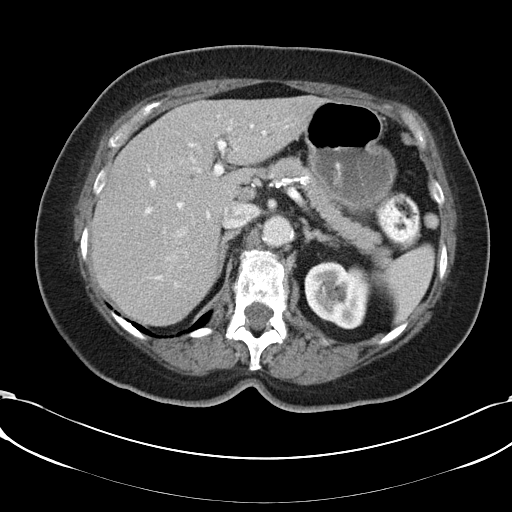
[im 71/120  soft-tissue]
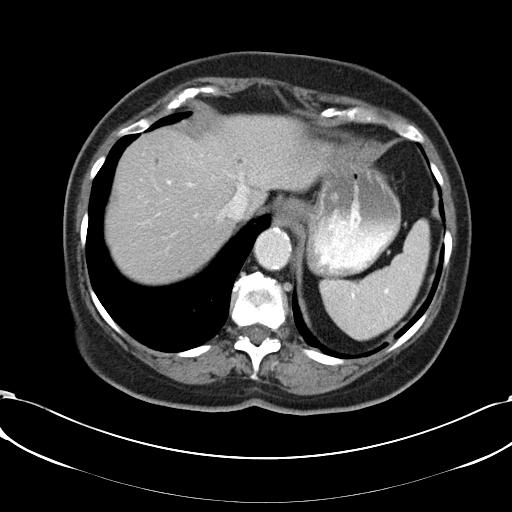
[im 78/120  soft-tissue]
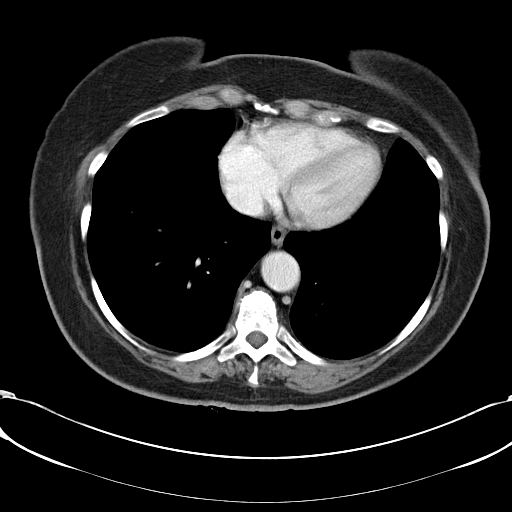
[im 92/120  soft-tissue]
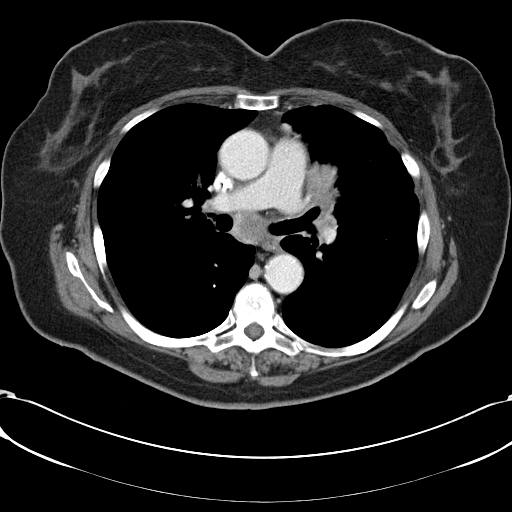
[im 92/120  bone]
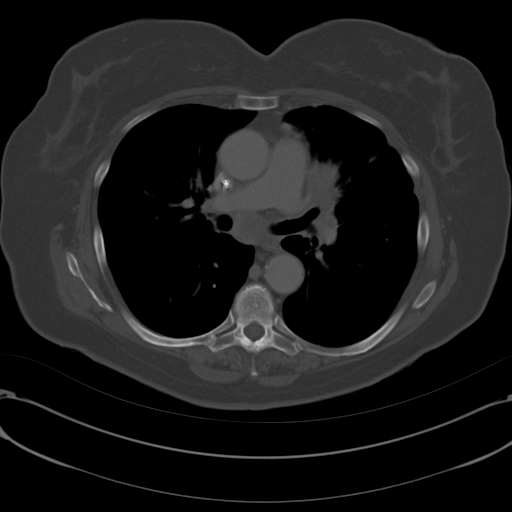
[im 99/120  soft-tissue]
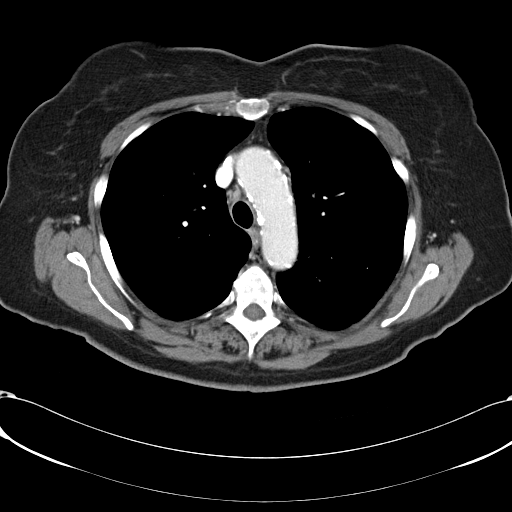
[im 113/120  soft-tissue]
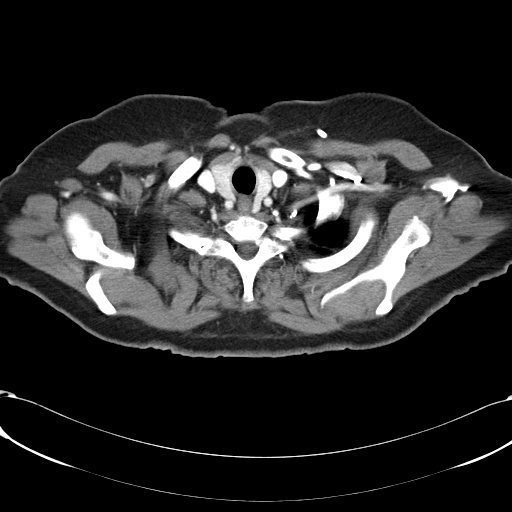

[Series 602: coronal images · coronal · 1.17mm/px · 3 of 80 slices shown]
[im 27/80  soft-tissue]
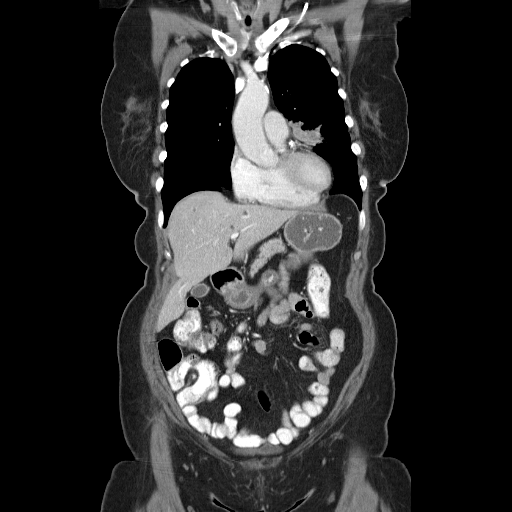
[im 36/80  soft-tissue]
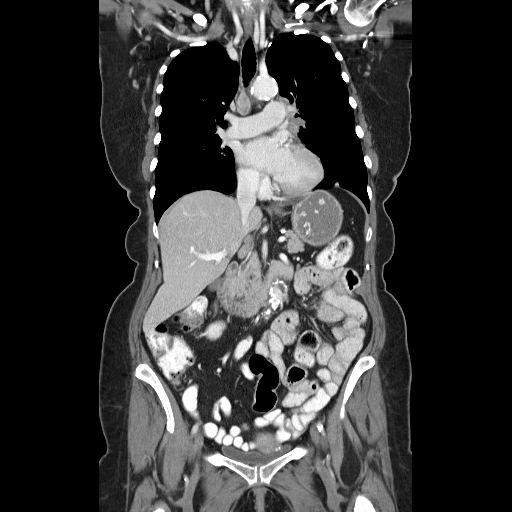
[im 44/80  soft-tissue]
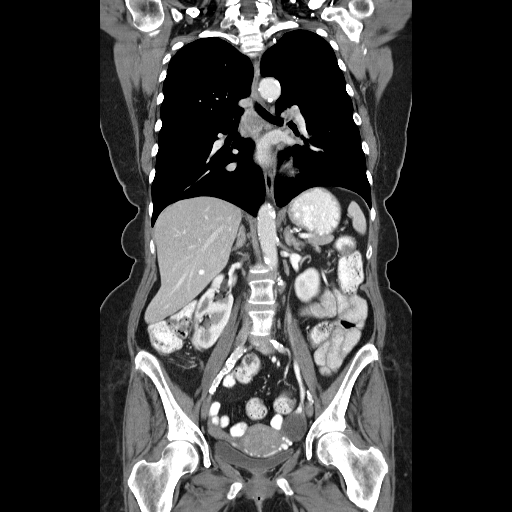

[14 of 46 positions shown; findings below may reference images not displayed]

FINDINGS: RECIST

Target Lesions:

1. Left mediastinum, soft tissue thickening: 2.6 x 2.6 cm (series
2/image 30), previously 2.6 x 2.3 cm
2. Right middle lobe branching nodularity: Stable unmeasurable focal
scarring in the posterior right middle lobe associated with the
right major fissure.
3. Left upper lobe nodule: Left upper lobe 0.6 cm pulmonary nodule
([DATE]), previously 0.6 cm
Non-target Lesions:

1. Subcarinal node:  2.4 cm short axis ([DATE]), previously 1.8 cm
2. Right paraesophageal node: 1.6 cm short axis (2/33), previously
1.3 cm
3. Left infrahilar node: 1.1 cm short axis (2/32), previously 1.0 cm
4. Left lower lobe subpleural nodule: 0.6 cm nodule (4/36),
previously 0.4 cm

CT CHEST FINDINGS

Mediastinum/Nodes: No axillary adenopathy. There are multiple
enlarging pretracheal, subcarinal and paraesophageal mediastinal
nodes. For example a 1.3 cm short axis upper pretracheal node ([DATE])
previously measured 0.8 cm, increased. A precarinal 1.5 cm short
axis node ([DATE]) previously measured 1.1 cm, increased. The
subcarinal 2.4 cm node ([DATE]) has increased from 1.8 cm. The right
lower paraesophageal 1.6 cm node (2/33) is increased from 1.3 cm.
Mildly enlarged 1.2 cm right hilar node ([DATE]), previously 1.2 cm,
unchanged. Mildly enlarged 1.1 cm left inferior hilar node (2/32),
previously 1.0 cm, increased. The normal heart size. No pericardial
effusion. Atherosclerosis, including left main, left anterior
descending, left circumflex and right coronary artery disease.
Please note that although the presence of coronary artery calcium
documents the presence of coronary artery disease, the severity of
this disease and any potential stenosis cannot be assessed on this
non-gated CT examination. Assessment for potential risk factor
modification, dietary therapy or pharmacologic therapy may be
warranted, if clinically indicated. Atherosclerotic nonaneurysmal
thoracic aorta. Normal caliber pulmonary arteries. No central
pulmonary emboli. Left internal jugular MediPort terminates in the
lower third of the superior vena cava.

Lungs/Pleura: No pleural effusion. Moderate centrilobular emphysema,
upper lobe predominant. The primary central left upper lobe 2.6 x
2.5 cm spiculated neoplasm ([DATE]) previously measured 2.6 x 2.3 cm,
not appreciably changed. There are at least 11 scattered enlarging
pulmonary nodules in both lungs, basilar predominant, at least 5 of
which are new, in keeping with progressive pulmonary metastases, the
largest of which measures 0.9 cm (4/44) in the left lower lobe,
previously 0.8 cm. Stable mild scarring in the medial right middle
lobe.

Musculoskeletal: Moderate degenerative changes in the thoracic
spine. No suspicious focal osseous lesions.

CT ABDOMEN PELVIS FINDINGS

Hepatobiliary: There are at least 8 subcentimeter hypodense lesions
scattered throughout the liver, largest 0.6 cm in the posterior
right liver lobe (2/63), which are too small to characterize, and
are not appreciably changed since 11/19/2013. No new liver lesions.
Normal gallbladder. No biliary ductal dilatation.

Pancreas: Normal.

Spleen: Normal.

Adrenals/Urinary Tract: Normal adrenals. Stable congenital
malrotation of the right kidney. Subcentimeter hypodense lesion in
the posterior interpolar right kidney, unchanged since 02/14/2014,
too small to characterize, statistically likely a benign renal cyst.
No hydronephrosis. Minimally distended and grossly normal bladder.

Stomach/Bowel: There is a normal stomach. Normal caliber small and
large bowel with no bowel wall thickening. Appendix is surgically
absent. No pneumoperitoneum. No ascites.

Vascular/Lymphatic: No lymphadenopathy in the abdomen or pelvis.
Stable 3.1 cm infrarenal abdominal aortic aneurysm with stable mild
to moderate eccentric mural thrombus.

Reproductive: Stable heterogeneous uterus with scattered coarse
internal calcifications, likely representing partially calcified
fibroids. No right adnexal abnormality. Stable simple 4.6 cm left
adnexal cystic structure (2/100).

Musculoskeletal: Mild degenerative changes in the lumbar spine. No
suspicious focal osseous lesions. Subcentimeter sclerotic foci in
the L2 and L3 vertebral bodies are unchanged since 02/09/2014 and
likely benign bone islands.
IMPRESSION: 1. Progressive pulmonary metastases.
2. Progressive moderate mediastinal and mild bilateral hilar
lymphadenopathy.
3. Stable central left upper lobe primary pulmonary malignancy.
4. No evidence of metastatic disease in the abdomen or pelvis.
5. Stable 3.1 cm infrarenal abdominal aortic aneurysm.
6. Stable simple 4.6 cm left adnexal cystic structure, likely
benign.

## 2017-02-07 IMAGING — CR DG CHEST 2V
2 series · 2 of 2 positions shown · non-contrast
Comparison: CT chest 03/30/2015 and earlier. Two-view chest x-ray
10/01/2014.

CLINICAL DATA: 69-year-old current history of left upper lobe lung
cancer, presenting with 2 day history of fever and shortness of
breath. Patient's most recent chemotherapy treatment was 4 days ago.

EXAM:
CHEST  2 VIEW

[w chest pa]
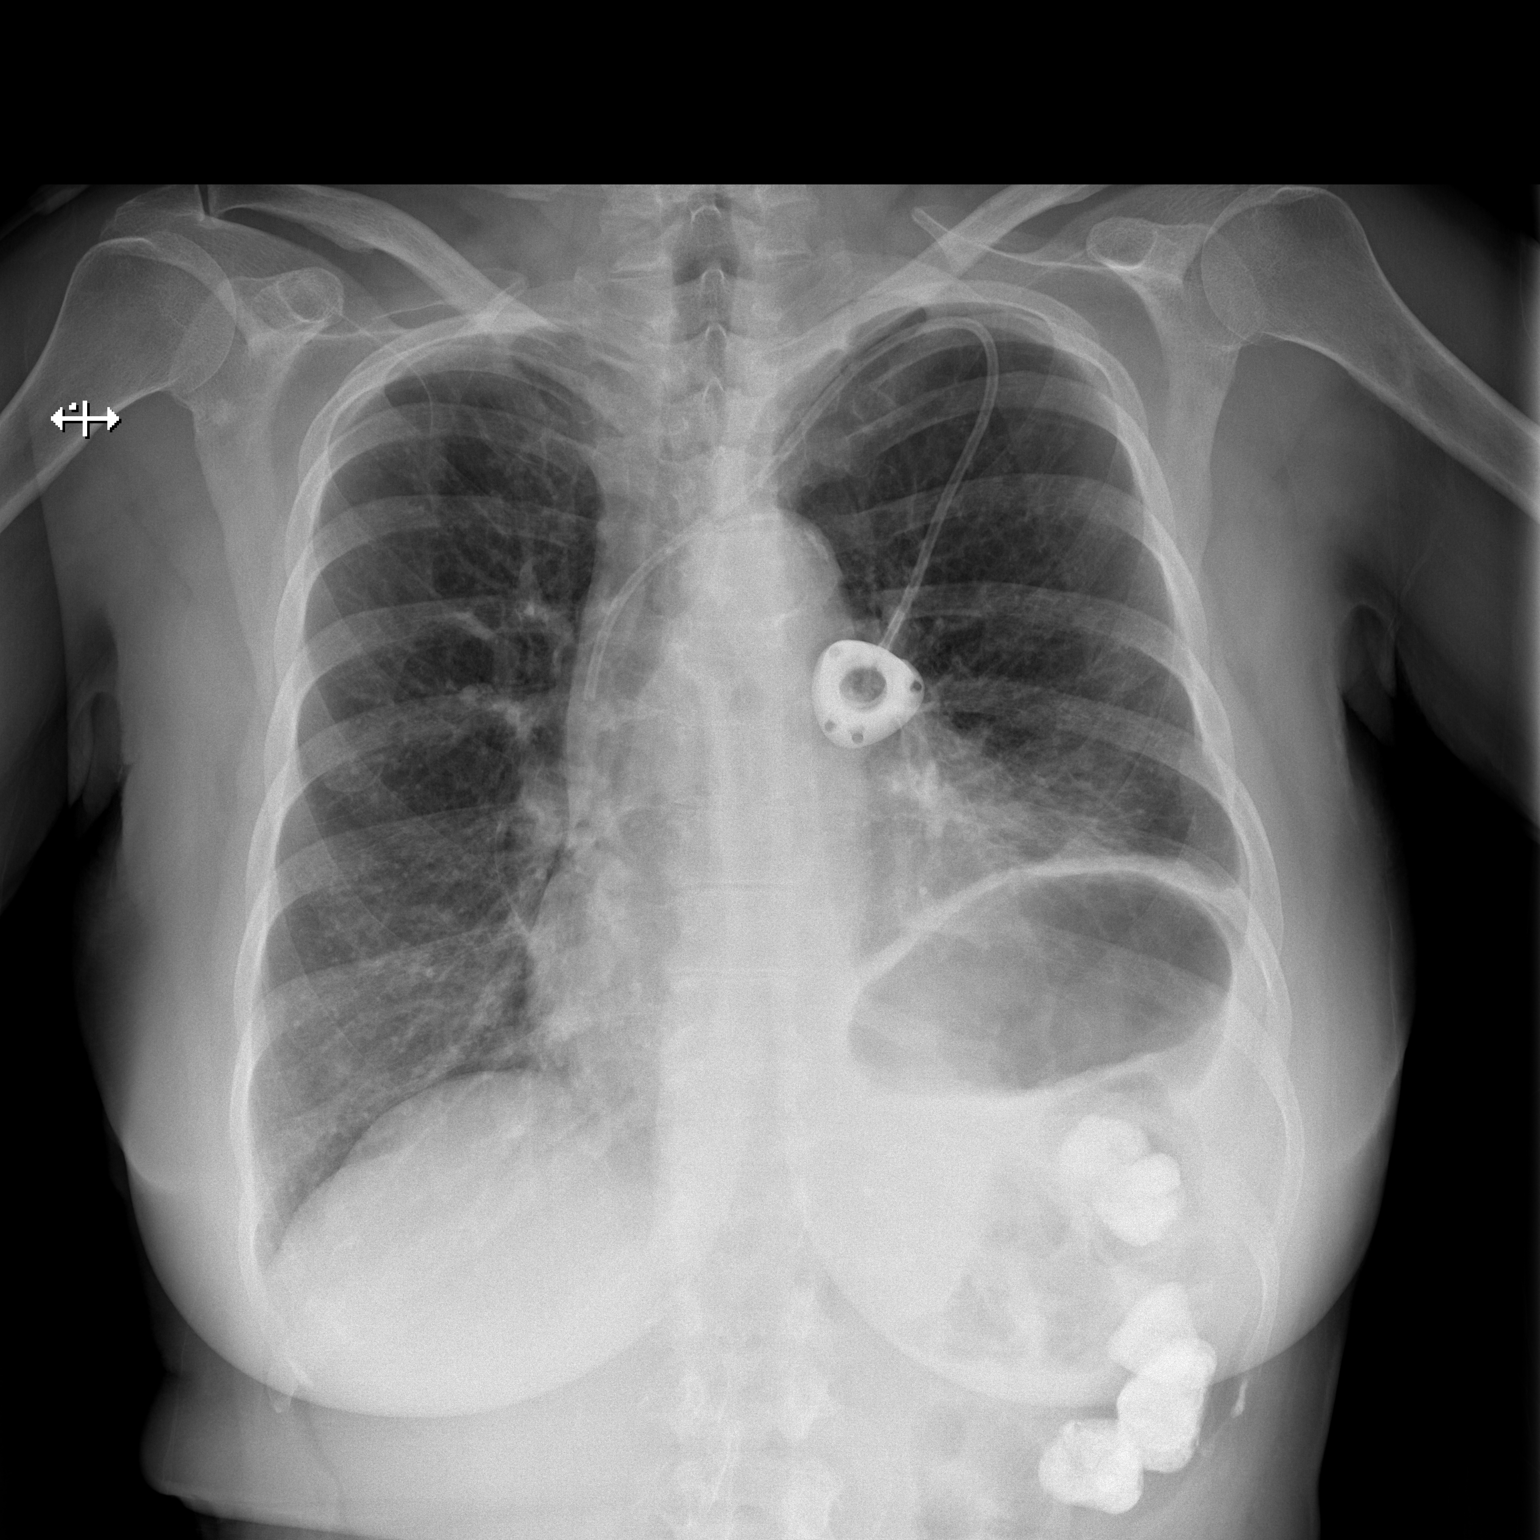

[w chest lat]
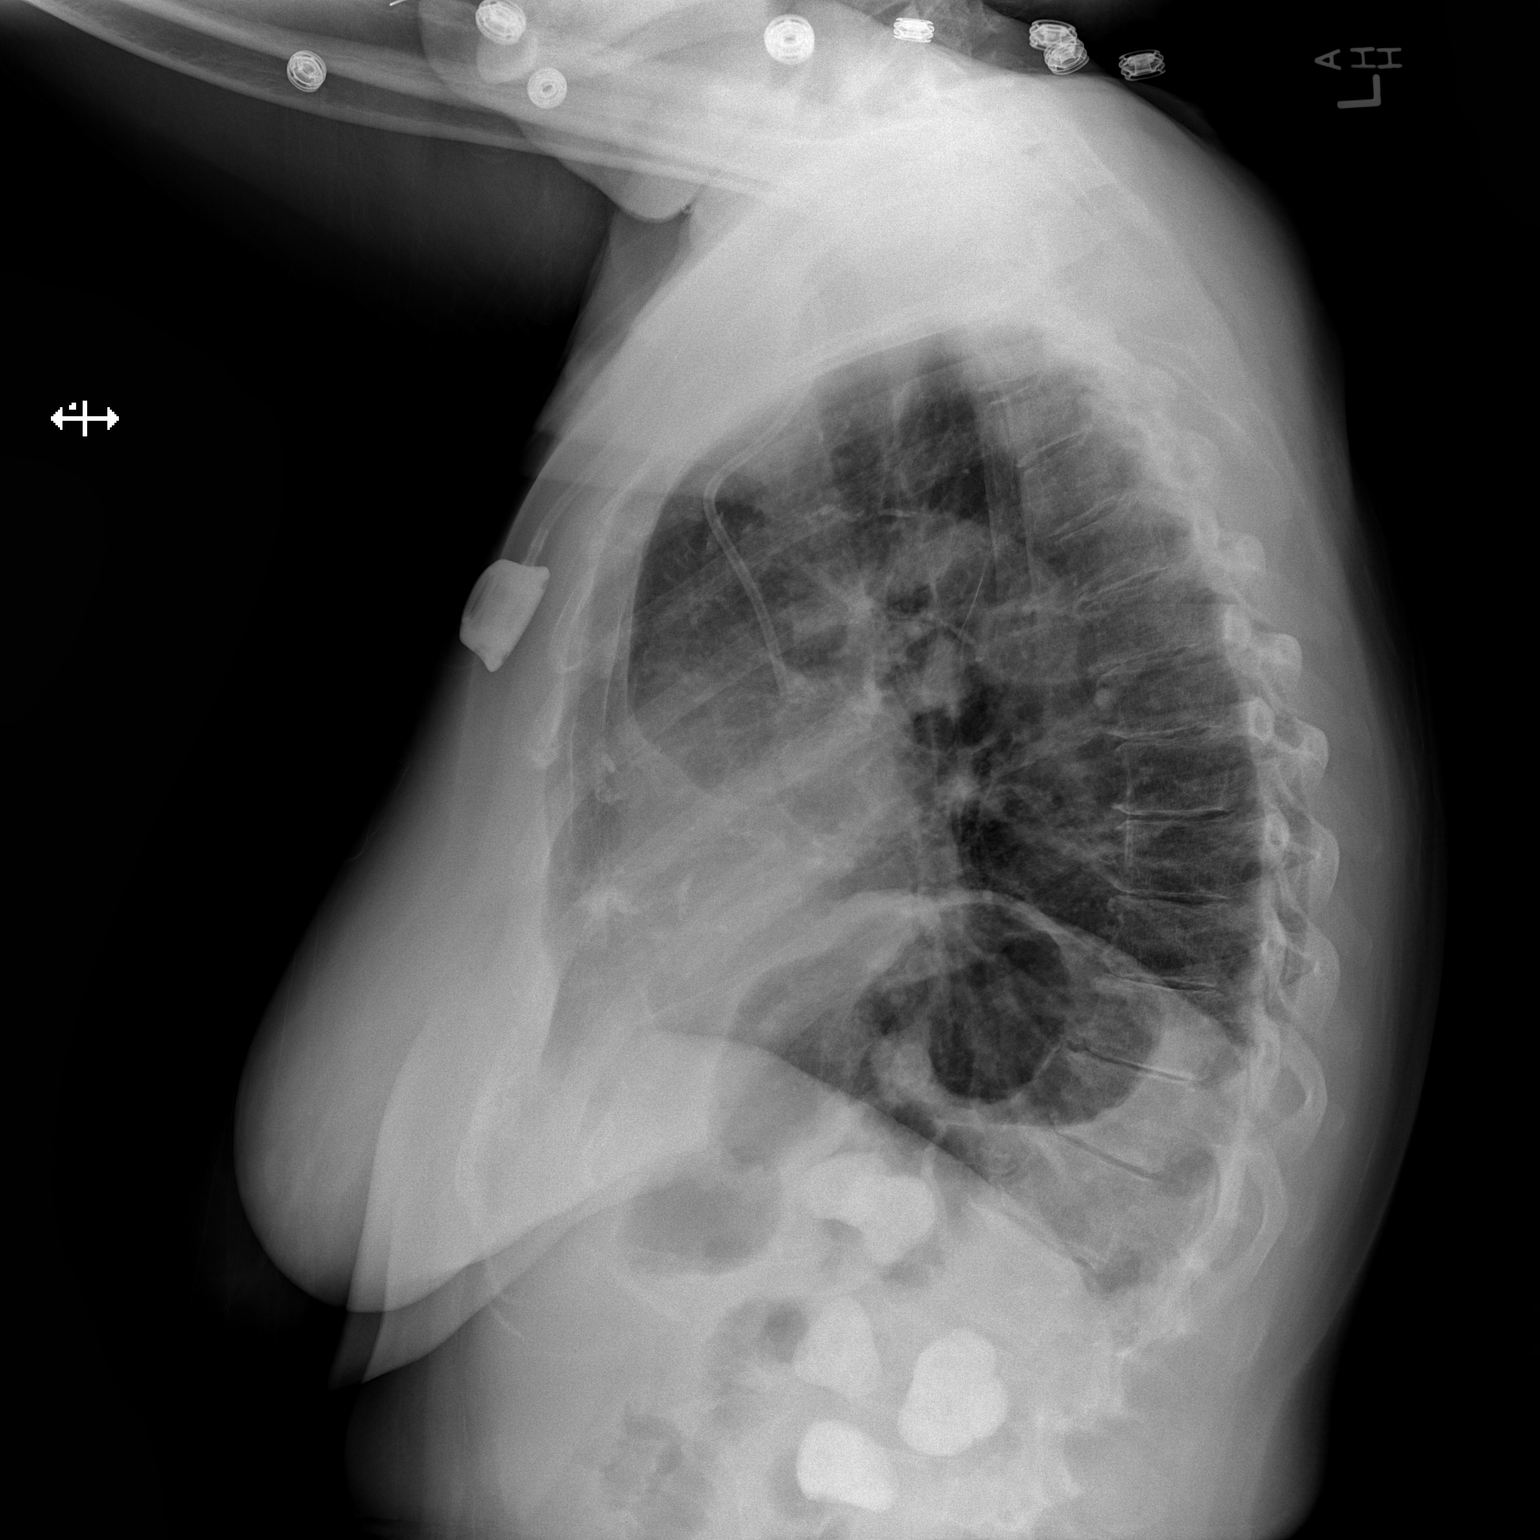

[2 of 2 positions shown; findings below may reference images not displayed]

FINDINGS: Cardiac silhouette upper normal in size, unchanged. Thoracic aorta
atherosclerotic, unchanged. Medial left upper lobe lung mass as
noted on recent CT is partially obscured by the port on the PA
image. Chronic elevation left hemidiaphragm with chronic
scar/atelectasis in the left lower lobe, unchanged. The multiple
small nodules identified on CT are less apparent on chest x-ray.
Lungs otherwise clear. No pleural effusions. Degenerative changes
throughout the thoracic spine. Left subclavian Port-A-Cath tip in
the upper SVC.
IMPRESSION: Medial left upper lobe lung mass as noted on the recent chest CT.
Chronic elevation left hemidiaphragm with chronic scar/atelectasis
in the left lower lobe. No acute cardiopulmonary disease.
